# Patient Record
Sex: Male | Born: 1940 | Race: White | Hispanic: No | Marital: Single | Smoking: Former smoker
Health system: Southern US, Community
[De-identification: ages and names within clinical notes are randomized; demographics above are authoritative.]

## PROBLEM LIST (undated history)

## (undated) DIAGNOSIS — M3119 Other thrombotic microangiopathy: Secondary | ICD-10-CM

## (undated) DIAGNOSIS — I10 Essential (primary) hypertension: Secondary | ICD-10-CM

## (undated) DIAGNOSIS — I2699 Other pulmonary embolism without acute cor pulmonale: Secondary | ICD-10-CM

## (undated) DIAGNOSIS — I639 Cerebral infarction, unspecified: Secondary | ICD-10-CM

## (undated) DIAGNOSIS — M311 Thrombotic microangiopathy: Secondary | ICD-10-CM

## (undated) DIAGNOSIS — F419 Anxiety disorder, unspecified: Secondary | ICD-10-CM

## (undated) HISTORY — DX: Other pulmonary embolism without acute cor pulmonale: I26.99

## (undated) HISTORY — DX: Other thrombotic microangiopathy: M31.19

## (undated) HISTORY — DX: Cerebral infarction, unspecified: I63.9

## (undated) HISTORY — DX: Thrombotic microangiopathy: M31.1

## (undated) HISTORY — PX: SPLENECTOMY, TOTAL: SHX788

---

## 2004-11-10 ENCOUNTER — Ambulatory Visit: Payer: Self-pay | Admitting: Internal Medicine

## 2004-11-25 ENCOUNTER — Ambulatory Visit: Payer: Self-pay | Admitting: Internal Medicine

## 2005-11-15 ENCOUNTER — Ambulatory Visit: Payer: Self-pay | Admitting: Unknown Physician Specialty

## 2011-01-06 ENCOUNTER — Ambulatory Visit: Payer: Self-pay | Admitting: Unknown Physician Specialty

## 2011-01-10 LAB — PATHOLOGY REPORT

## 2011-06-02 ENCOUNTER — Emergency Department: Payer: Self-pay | Admitting: *Deleted

## 2014-07-25 ENCOUNTER — Encounter (HOSPITAL_COMMUNITY): Payer: Self-pay | Admitting: *Deleted

## 2014-07-25 ENCOUNTER — Emergency Department: Payer: Self-pay | Admitting: Emergency Medicine

## 2014-07-25 ENCOUNTER — Inpatient Hospital Stay (HOSPITAL_COMMUNITY)
Admission: AD | Admit: 2014-07-25 | Discharge: 2014-07-30 | DRG: 280 | Disposition: A | Discharge status: Home / Self Care | Payer: Medicare PPO | Source: Other Acute Inpatient Hospital | Attending: Cardiology | Admitting: Cardiology

## 2014-07-25 DIAGNOSIS — Z8673 Personal history of transient ischemic attack (TIA), and cerebral infarction without residual deficits: Secondary | ICD-10-CM

## 2014-07-25 DIAGNOSIS — I25119 Atherosclerotic heart disease of native coronary artery with unspecified angina pectoris: Secondary | ICD-10-CM | POA: Diagnosis present

## 2014-07-25 DIAGNOSIS — R0600 Dyspnea, unspecified: Secondary | ICD-10-CM

## 2014-07-25 DIAGNOSIS — I82442 Acute embolism and thrombosis of left tibial vein: Secondary | ICD-10-CM | POA: Diagnosis present

## 2014-07-25 DIAGNOSIS — I82403 Acute embolism and thrombosis of unspecified deep veins of lower extremity, bilateral: Secondary | ICD-10-CM | POA: Diagnosis not present

## 2014-07-25 DIAGNOSIS — I10 Essential (primary) hypertension: Secondary | ICD-10-CM | POA: Diagnosis present

## 2014-07-25 DIAGNOSIS — R778 Other specified abnormalities of plasma proteins: Secondary | ICD-10-CM | POA: Diagnosis present

## 2014-07-25 DIAGNOSIS — F17210 Nicotine dependence, cigarettes, uncomplicated: Secondary | ICD-10-CM | POA: Diagnosis present

## 2014-07-25 DIAGNOSIS — I2699 Other pulmonary embolism without acute cor pulmonale: Secondary | ICD-10-CM | POA: Diagnosis not present

## 2014-07-25 DIAGNOSIS — E785 Hyperlipidemia, unspecified: Secondary | ICD-10-CM | POA: Diagnosis not present

## 2014-07-25 DIAGNOSIS — R7989 Other specified abnormal findings of blood chemistry: Secondary | ICD-10-CM

## 2014-07-25 DIAGNOSIS — Z86718 Personal history of other venous thrombosis and embolism: Secondary | ICD-10-CM | POA: Clinically undetermined

## 2014-07-25 DIAGNOSIS — Z7901 Long term (current) use of anticoagulants: Secondary | ICD-10-CM | POA: Diagnosis not present

## 2014-07-25 DIAGNOSIS — I214 Non-ST elevation (NSTEMI) myocardial infarction: Principal | ICD-10-CM

## 2014-07-25 DIAGNOSIS — R0609 Other forms of dyspnea: Secondary | ICD-10-CM

## 2014-07-25 DIAGNOSIS — F419 Anxiety disorder, unspecified: Secondary | ICD-10-CM | POA: Diagnosis present

## 2014-07-25 HISTORY — DX: Anxiety disorder, unspecified: F41.9

## 2014-07-25 HISTORY — DX: Essential (primary) hypertension: I10

## 2014-07-25 LAB — COMPREHENSIVE METABOLIC PANEL
AST: 29 U/L (ref 15–37)
Albumin: 3.3 g/dL — ABNORMAL LOW (ref 3.4–5.0)
Alkaline Phosphatase: 89 U/L
Anion Gap: 8 (ref 7–16)
BILIRUBIN TOTAL: 0.8 mg/dL (ref 0.2–1.0)
BUN: 16 mg/dL (ref 7–18)
CO2: 24 mmol/L (ref 21–32)
CREATININE: 1.2 mg/dL (ref 0.60–1.30)
Calcium, Total: 9.4 mg/dL (ref 8.5–10.1)
Chloride: 109 mmol/L — ABNORMAL HIGH (ref 98–107)
EGFR (Non-African Amer.): >60
Glucose: 102 mg/dL — ABNORMAL HIGH (ref 65–99)
Osmolality: 283 (ref 275–301)
Potassium: 3.4 mmol/L — ABNORMAL LOW (ref 3.5–5.1)
SGPT (ALT): 16 U/L
Sodium: 141 mmol/L (ref 136–145)
Total Protein: 7.4 g/dL (ref 6.4–8.2)

## 2014-07-25 LAB — CBC
HCT: 50.1 % (ref 40.0–52.0)
HGB: 16.8 g/dL (ref 13.0–18.0)
MCH: 35.9 pg — AB (ref 26.0–34.0)
MCHC: 33.5 g/dL (ref 32.0–36.0)
MCV: 107 fL — ABNORMAL HIGH (ref 80–100)
Platelet: 260 10*3/uL (ref 150–440)
RBC: 4.68 10*6/uL (ref 4.40–5.90)
RDW: 14.5 % (ref 11.5–14.5)
WBC: 8.4 10*3/uL (ref 3.8–10.6)

## 2014-07-25 LAB — PRO B NATRIURETIC PEPTIDE: B-Type Natriuretic Peptide: 3573 pg/mL — ABNORMAL HIGH (ref 0–125)

## 2014-07-25 LAB — PROTIME-INR
INR: 0.9
Prothrombin Time: 12.3 secs (ref 11.5–14.7)

## 2014-07-25 LAB — TROPONIN I
Troponin I: 0.36 ng/mL — ABNORMAL HIGH (ref <0.031)
Troponin-I: 0.47 ng/mL — ABNORMAL HIGH

## 2014-07-25 LAB — APTT: ACTIVATED PTT: 26.4 s (ref 23.6–35.9)

## 2014-07-25 LAB — CK TOTAL AND CKMB (NOT AT ARMC)
CK, Total: 42 U/L (ref 39–308)
CK-MB: 3.4 ng/mL (ref 0.5–3.6)

## 2014-07-25 LAB — TSH: TSH: 0.811 u[IU]/mL (ref 0.350–4.500)

## 2014-07-25 MED ORDER — SODIUM CHLORIDE 0.9 % IJ SOLN
3.0000 mL | Freq: Two times a day (BID) | INTRAMUSCULAR | Status: DC
  Administered 2014-07-25 – 2014-07-27 (×4): 3 mL via INTRAVENOUS

## 2014-07-25 MED ORDER — SODIUM CHLORIDE 0.9 % IV SOLN: 250.0000 mL | INTRAVENOUS | Status: DC | PRN

## 2014-07-25 MED ORDER — NITROGLYCERIN 0.4 MG SL SUBL: 0.4000 mg | SUBLINGUAL_TABLET | SUBLINGUAL | Status: DC | PRN

## 2014-07-25 MED ORDER — HEPARIN (PORCINE) IN NACL 100-0.45 UNIT/ML-% IJ SOLN
1400.0000 [IU]/h | INTRAMUSCULAR | Status: DC
  Administered 2014-07-25 – 2014-07-27 (×4): 1400 [IU]/h via INTRAVENOUS
  Filled 2014-07-25 (×5): qty 250

## 2014-07-25 MED ORDER — LOSARTAN POTASSIUM 50 MG PO TABS
50.0000 mg | ORAL_TABLET | Freq: Every day | ORAL | Status: DC
  Administered 2014-07-25 – 2014-07-30 (×6): 50 mg via ORAL
  Filled 2014-07-25 (×6): qty 1

## 2014-07-25 MED ORDER — ASPIRIN 81 MG PO CHEW: 324.0000 mg | CHEWABLE_TABLET | ORAL | Status: AC

## 2014-07-25 MED ORDER — PANTOPRAZOLE SODIUM 40 MG PO TBEC
40.0000 mg | DELAYED_RELEASE_TABLET | Freq: Every day | ORAL | Status: DC
  Administered 2014-07-25 – 2014-07-30 (×6): 40 mg via ORAL
  Filled 2014-07-25 (×6): qty 1

## 2014-07-25 MED ORDER — ASPIRIN EC 81 MG PO TBEC
81.0000 mg | DELAYED_RELEASE_TABLET | Freq: Every day | ORAL | Status: DC
  Administered 2014-07-26 – 2014-07-29 (×3): 81 mg via ORAL
  Filled 2014-07-25 (×5): qty 1

## 2014-07-25 MED ORDER — ATORVASTATIN CALCIUM 40 MG PO TABS
40.0000 mg | ORAL_TABLET | Freq: Every day | ORAL | Status: DC
  Administered 2014-07-25 – 2014-07-29 (×5): 40 mg via ORAL
  Filled 2014-07-25 (×6): qty 1

## 2014-07-25 MED ORDER — ONDANSETRON HCL 4 MG/2ML IJ SOLN: 4.0000 mg | Freq: Four times a day (QID) | INTRAMUSCULAR | Status: DC | PRN

## 2014-07-25 MED ORDER — LORAZEPAM 0.5 MG PO TABS
0.5000 mg | ORAL_TABLET | Freq: Three times a day (TID) | ORAL | Status: DC | PRN
  Administered 2014-07-25 – 2014-07-27 (×5): 0.5 mg via ORAL
  Filled 2014-07-25 (×5): qty 1

## 2014-07-25 MED ORDER — METOPROLOL TARTRATE 12.5 MG HALF TABLET
12.5000 mg | ORAL_TABLET | Freq: Two times a day (BID) | ORAL | Status: DC
  Administered 2014-07-25 – 2014-07-30 (×10): 12.5 mg via ORAL
  Filled 2014-07-25 (×12): qty 1

## 2014-07-25 MED ORDER — ASPIRIN 300 MG RE SUPP
300.0000 mg | RECTAL | Status: AC
  Filled 2014-07-25: qty 1

## 2014-07-25 MED ORDER — ACETAMINOPHEN 325 MG PO TABS
650.0000 mg | ORAL_TABLET | ORAL | Status: DC | PRN
  Administered 2014-07-27 – 2014-07-28 (×2): 650 mg via ORAL
  Filled 2014-07-25 (×2): qty 2

## 2014-07-25 MED ORDER — SODIUM CHLORIDE 0.9 % IJ SOLN: 3.0000 mL | INTRAMUSCULAR | Status: DC | PRN

## 2014-07-25 NOTE — Progress Notes (Addendum)
ANTICOAGULATION CONSULT NOTE - Initial Consult  Pharmacy Consult for heparin Indication: NSTEMI  Not on File  Patient Measurements: Height: 6' (182.9 cm) Weight: 245 lb 9.5 oz (111.4 kg) IBW/kg (Calculated) : 77.6 Heparin Dosing Weight: 101 kg  Vital Signs: Temp: 98 F (36.7 C) (01/02 1637) Temp Source: Oral (01/02 1637) BP: 143/87 mmHg (01/02 1745) Pulse Rate: 86 (01/02 1745)  Labs [facility]): WBC 8.4 H/H 16.8/50.1 Platelet 260 SCr 1.2 No results for input(s): HGB, HCT, PLT, APTT, LABPROT, INR, HEPARINUNFRC, CREATININE, CKTOTAL, CKMB, TROPONINI in the last 72 hours.  CrCl cannot be calculated (Patient has no serum creatinine result on file.).   Medical History: Past Medical History  Diagnosis Date  . Hypertension   . Anxiety     Medications:  See electronic PTA med list  Assessment: 74 y/o male with no prior cardiac history who presented to [facility] ED with 10 days of dyspnea. He was started on a heparin drip for NSTEMI and positive troponin (0.47). Per [facility], he received a 4000 units IV bolus and infusion at 1400 units/hr which was started at 14:48. Cardiology note mentions a "TIA" the other day but unsure if this is just patient reported - will try to keep heparin level closer to 0.3-0.5. No bleeding noted, CBC was normal at [facility].  Goal of Therapy:  Heparin level 0.3-0.7 units/ml Monitor platelets by anticoagulation protocol: Yes   Plan:  - Continue heparin drip at 1400 units/hr - Heparin level at 23:00 - Daily heparin level and CBC - Monitor for s/sx of bleeding  [facility], [place].D., BCPS Clinical Pharmacist Pager: [phone] 07/25/2014 7:00 PM   Addendum: F/u HL is therapeutic at 0.4. RN reports no s/s of bleeding. Continue heparin infusion at 1400 units/hr. F/u 8 hour HL.   Vinnie Level, PharmD., BCPS Clinical Pharmacist Pager [phone]

## 2014-07-25 NOTE — H&P (Signed)
CARDIOLOGY ADMISSION NOTE  Patient ID: TEVYN CODD MRN: [id number] DOB/AGE: 73-Jun-1942 73 y.o.  Admit date: 07/25/2014 Primary Physician   Dr. Larwance Sachs Primary Cardiologist   None  Chief Complaint    Dyspnea  HPI:   The patient has no prior cardiac history.  He was presented to [facility] ED with about 10 days of dyspnea.  He has had no chest, neck or arm pain.  He has had no PND or orthopnea.  He saw his primary MD and was treated with a Zpac.  He did have a POET (Plain Old Exercise Treadmill).  However, he does not have these results and he was only able to stay on the treadmill for about 2 minutes stopping with elevated BP and dyspnea.  He presented to the ED today because the dyspnea was very severe walking up a flight of stairs.  He has not had any resting dyspnea, PND or orthopnea.  He did have some nausea this AM but no other associated symptoms.  He reports a "TIA" the other day.  He reached into his pocket to touch his cell phone and received a shock and could not move his left arm and did not recognize his own arm for about 5 minutes.  He did not seek medical attention.  He has been under a great deal of stress.  His mother died 5 days ago.  In the [facility] ED Troponin was elevated at 0.47.  EKG demonstrated diffuse T wave inversion.   He was treated with NTG and heparin.  He reports that prior to this he had no cardiac symptoms.    Past Medical History  Diagnosis Date  . Coronary artery disease   . Anginal pain   . Hypertension   . Anxiety     No past surgical history on file.  Not on File  Prior to Admission medications   ASA 81 mg daily  Omeprazole 20 mg po daily  Lovastatin 20 mg po daily  Losartan/HCT  50 - 12.5 po daily   History   Social History  . Marital Status: Single    Spouse Name: N/A    Number of Children: N/A  . Years of Education: N/A   Occupational History  . Not on file.   Social History Main Topics  . Smoking status: Current Every Day Smoker   Types: Cigarettes  . Smokeless tobacco: Not on file  . Alcohol Use: No     Comment: quit 30 years ago  . Drug Use: Yes  . Sexual Activity: Yes    Birth Control/ Protection: None   Other Topics Concern  . Not on file   Social History Narrative    No family history on file.   ROS:  Foot cramping.  Otherwise as stated in the HPI and negative for all other systems.  Physical Exam: Blood pressure 143/87, pulse 86, temperature 98 F (36.7 C), temperature source Oral, resp. rate 27, height 6' (1.829 m), weight 245 lb 9.5 oz (111.4 kg), SpO2 90 %.  GENERAL:  Well appearing HEENT:  Pupils equal round and reactive, fundi not visualized, oral mucosa unremarkable NECK:  No jugular venous distention, waveform within normal limits, carotid upstroke brisk and symmetric, no bruits, no thyromegaly LYMPHATICS:  No cervical, inguinal adenopathy LUNGS:  Clear to auscultation bilaterally BACK:  No CVA tenderness CHEST:  Unremarkable HEART:  PMI not displaced or sustained,S1 and S2 within normal limits, no S3, no S4, no clicks, no rubs, no murmurs ABD:  Flat, positive bowel sounds normal in frequency in pitch, no bruits, no rebound, no guarding, no midline pulsatile mass, no hepatomegaly, no splenomegaly EXT:  2 plus pulses throughout, no edema, no cyanosis no clubbing SKIN:  No rashes no nodules NEURO:  Cranial nerves II through XII grossly intact, motor grossly intact throughout PSYCH:  Cognitively intact, oriented to person place and time  Labs: Troponin 0.47, H/H  16.8/50.1, Plt 260, BUN 16, creat 1.2, Na 141, potassium 3.4, BNP 3573   Radiology:  CXR:  No acute disease (per [facility] report)  EKG:  ST, rate 104, QTc prolonged, T wave inversion in V3 - V6 and the inferior leads.  No ST changes.  07/25/2014  ASSESSMENT AND PLAN:    NSTEMI:  The patient will be kept on ASA and heparin.  He will have a cardiac cath electively.  The patient understands that risks included but are not limited to  stroke (1 in 1000), death (1 in 1000), kidney failure [usually temporary] (1 in 500), bleeding (1 in 200), allergic reaction [possibly serious] (1 in 200).  The patient understands and agrees to proceed.     RISK REDUCTION:  He has had an excellent lipid profile.  I will start moderate dose Lipitor.  HTN:  He will continue his previous dose Losartan.  I will hold HCTZ.    SignedMansoor Hillyard 07/25/2014, 6:12 PM

## 2014-07-26 LAB — BASIC METABOLIC PANEL
Anion gap: 9 (ref 5–15)
BUN: 12 mg/dL (ref 6–23)
CALCIUM: 9.3 mg/dL (ref 8.4–10.5)
CHLORIDE: 106 meq/L (ref 96–112)
CO2: 24 mmol/L (ref 19–32)
Creatinine, Ser: 1.07 mg/dL (ref 0.50–1.35)
GFR calc non Af Amer: 67 mL/min — ABNORMAL LOW (ref >90)
GFR, EST AFRICAN AMERICAN: 78 mL/min — AB (ref >90)
Glucose, Bld: 133 mg/dL — ABNORMAL HIGH (ref 70–99)
POTASSIUM: 3.7 mmol/L (ref 3.5–5.1)
Sodium: 139 mmol/L (ref 135–145)

## 2014-07-26 LAB — CBC
HCT: 45.1 % (ref 39.0–52.0)
Hemoglobin: 15.7 g/dL (ref 13.0–17.0)
MCH: 35.1 pg — AB (ref 26.0–34.0)
MCHC: 34.8 g/dL (ref 30.0–36.0)
MCV: 100.9 fL — ABNORMAL HIGH (ref 78.0–100.0)
PLATELETS: 260 10*3/uL (ref 150–400)
RBC: 4.47 MIL/uL (ref 4.22–5.81)
RDW: 15.2 % (ref 11.5–15.5)
WBC: 8.4 10*3/uL (ref 4.0–10.5)

## 2014-07-26 LAB — HEPARIN LEVEL (UNFRACTIONATED)
HEPARIN UNFRACTIONATED: 0.4 [IU]/mL (ref 0.30–0.70)
Heparin Unfractionated: 0.41 IU/mL (ref 0.30–0.70)

## 2014-07-26 LAB — TROPONIN I
TROPONIN I: 0.19 ng/mL — AB (ref <0.031)
Troponin I: 0.28 ng/mL — ABNORMAL HIGH (ref <0.031)

## 2014-07-26 LAB — MRSA PCR SCREENING: MRSA BY PCR: NEGATIVE

## 2014-07-26 MED ORDER — TICAGRELOR 90 MG PO TABS
180.0000 mg | ORAL_TABLET | Freq: Once | ORAL | Status: AC
  Administered 2014-07-26: 180 mg via ORAL
  Filled 2014-07-26: qty 2

## 2014-07-26 MED ORDER — ALPRAZOLAM 0.25 MG PO TABS
0.2500 mg | ORAL_TABLET | Freq: Every evening | ORAL | Status: DC | PRN
  Administered 2014-07-26 – 2014-07-27 (×2): 0.25 mg via ORAL
  Filled 2014-07-26 (×2): qty 1

## 2014-07-26 MED ORDER — SODIUM CHLORIDE 0.9 % IV SOLN
1.0000 mL/kg/h | INTRAVENOUS | Status: DC
  Administered 2014-07-27: 1 mL/kg/h via INTRAVENOUS

## 2014-07-26 MED ORDER — ASPIRIN 81 MG PO CHEW
81.0000 mg | CHEWABLE_TABLET | ORAL | Status: AC
  Administered 2014-07-27: 81 mg via ORAL
  Filled 2014-07-26: qty 1

## 2014-07-26 MED ORDER — SODIUM CHLORIDE 0.9 % IV SOLN: 250.0000 mL | INTRAVENOUS | Status: DC | PRN

## 2014-07-26 MED ORDER — SODIUM CHLORIDE 0.9 % IJ SOLN: 3.0000 mL | INTRAMUSCULAR | Status: DC | PRN

## 2014-07-26 MED ORDER — SODIUM CHLORIDE 0.9 % IJ SOLN: 3.0000 mL | Freq: Two times a day (BID) | INTRAMUSCULAR | Status: DC

## 2014-07-26 MED ORDER — CETYLPYRIDINIUM CHLORIDE 0.05 % MT LIQD
7.0000 mL | Freq: Two times a day (BID) | OROMUCOSAL | Status: DC
Start: 2014-07-26 — End: 2014-07-27
  Administered 2014-07-26 – 2014-07-27 (×3): 7 mL via OROMUCOSAL

## 2014-07-26 MED ORDER — DOCUSATE SODIUM 100 MG PO CAPS
100.0000 mg | ORAL_CAPSULE | Freq: Every day | ORAL | Status: DC
  Administered 2014-07-26: 100 mg via ORAL
  Filled 2014-07-26 (×2): qty 1

## 2014-07-26 MED ORDER — TICAGRELOR 90 MG PO TABS
90.0000 mg | ORAL_TABLET | Freq: Two times a day (BID) | ORAL | Status: DC
  Administered 2014-07-26 – 2014-07-28 (×4): 90 mg via ORAL
  Filled 2014-07-26 (×7): qty 1

## 2014-07-26 NOTE — Progress Notes (Signed)
Pt educated on procedure for  Cardiac cath on Monday.  Pt refused video and to have right wrist IV moved at this time.  Will continue to monitor. Karena Addison T

## 2014-07-26 NOTE — Progress Notes (Signed)
       Patient Name: Craig [name]      SUBJECTIVE: 74 yo male presenting to [facility] yday with complaints of dyspnea and Tn 0.47 and ECG TW inversions diffusely  For cath in am  No further chest pain  Great deal of stress with recent death of his mother  Past Medical History  Diagnosis Date  . Hypertension   . Anxiety     Scheduled Meds:  Scheduled Meds: . aspirin  324 mg Oral NOW   Or  . aspirin  300 mg Rectal NOW  . aspirin EC  81 mg Oral Daily  . atorvastatin  40 mg Oral q1800  . losartan  50 mg Oral Daily  . metoprolol tartrate  12.5 mg Oral BID  . pantoprazole  40 mg Oral Daily  . sodium chloride  3 mL Intravenous Q12H   Continuous Infusions: . heparin 1,400 Units/hr (07/26/14 0600)   sodium chloride, acetaminophen, LORazepam, nitroGLYCERIN, ondansetron (ZOFRAN) IV, sodium chloride    PHYSICAL EXAM Filed Vitals:   07/25/14 2111 07/25/14 2345 07/26/14 0340 07/26/14 0737  BP: 133/78 113/77 118/71 128/73  Pulse: 81  60   Temp:  98.7 F (37.1 C) 98.1 F (36.7 C) 98 F (36.7 C)  TempSrc:  Oral Oral Oral  Resp:  Height:      Weight:   242 lb 15.2 oz (110.2 kg)   SpO2:  95% 93% 97%    Well developed and nourished in no acute distress HENT normal Neck supple with JVP-flat Clear Regular rate and rhythm, no murmurs or gallops Abd-soft with active BS No Clubbing cyanosis edema Skin-warm and dry A & Oriented  Grossly normal sensory and motor function  TELEMETRY: Reviewed telemetry pt in nsr    Intake/Output Summary (Last 24 hours) at 07/26/14 1019 Last data filed at 07/26/14 0900  Gross per 24 hour  Intake  556.5 ml  Output   1000 ml  Net -443.5 ml    LABS: Basic Metabolic Panel: No results for input(s): NA, K, CL, CO2, GLUCOSE, BUN, CREATININE, CALCIUM, MG, PHOS in the last 168 hours. Cardiac Enzymes:  Recent Labs  07/25/14 2010 07/26/14 0100  TROPONINI 0.36* 0.28*   CBC:  Recent Labs Lab 07/26/14 0935  WBC 8.4  HGB 15.7   HCT 45.1  MCV 100.9*  PLT 260   PROTIME:  Thyroid Function Tests:  Recent Labs  07/25/14 2010  TSH 0.811   Anemia Panel:   ASSESSMENT AND PLAN:  Principal Problem:   NSTEMI (non-ST elevated myocardial infarction) Active Problems:   HTN (hypertension)  Continue Hep Begin Ticagrelor CAth in am  Orders written Echo pending   Signed, Sherryl Manges MD  07/26/2014

## 2014-07-26 NOTE — Progress Notes (Signed)
ANTICOAGULATION CONSULT NOTE - Follow Up Consult  Pharmacy Consult for heparin Indication: NSTEMI  Not on File  Patient Measurements: Height: 6' (182.9 cm) Weight: 242 lb 15.2 oz (110.2 kg) IBW/kg (Calculated) : 77.6 Heparin Dosing Weight: 101 kg  Vital Signs: Temp: 98 F (36.7 C) (01/03 0737) Temp Source: Oral (01/03 0737) BP: 128/73 mmHg (01/03 0737) Pulse Rate: 60 (01/03 0340)  Labs [facility]): WBC 8.4 H/H 16.8/50.1 Platelet 260 SCr 1.2  Recent Labs  07/25/14 2010 07/25/14 2341 07/26/14 0100 07/26/14 0935  HGB  --   --   --  15.7  HCT  --   --   --  45.1  PLT  --   --   --  260  HEPARINUNFRC  --  0.40  --  0.41  CREATININE  --   --   --  1.07  TROPONINI 0.36*  --  0.28* 0.19*    Estimated Creatinine Clearance: 80 mL/min (by C-G formula based on Cr of 1.07).   Medical History: Past Medical History  Diagnosis Date  . Hypertension   . Anxiety    Medications: . heparin 1,400 Units/hr (07/26/14 1041)    Assessment: 74 y/o male with no prior cardiac history who presented to [facility] ED with 10 days of dyspnea. He was started on a heparin drip for NSTEMI and positive troponin (0.47). Per [facility], he received a 4000 units IV bolus and infusion at 1400 units/hr which was started at 14:48. Cardiology note mentions a "TIA" the other day but unsure if this is just patient reported - will try to keep heparin level closer to 0.3-0.5. No bleeding noted, CBC was normal at [facility].  Heparin level this morning remains therapeutic on heparin 1400 units/hr.  No bleeding or complications noted.  Planning to go to cath lab tomorrow.  Goal of Therapy:  Heparin level 0.3-0.7 units/ml Monitor platelets by anticoagulation protocol: Yes   Plan:  - Continue heparin drip at 1400 units/hr - Daily heparin level and CBC - Monitor for s/sx of bleeding - F/u plans for anticoagulation if needed after cath tomorrow.  Tad Moore, BCPS  Clinical Pharmacist Pager [phone]  07/26/2014 11:13 AM

## 2014-07-27 ENCOUNTER — Encounter (HOSPITAL_COMMUNITY): Payer: Self-pay | Admitting: Cardiology

## 2014-07-27 ENCOUNTER — Encounter (HOSPITAL_COMMUNITY)
Admission: AD | Disposition: A | Discharge status: Home / Self Care | Payer: Self-pay | Source: Other Acute Inpatient Hospital | Attending: Cardiology

## 2014-07-27 DIAGNOSIS — I517 Cardiomegaly: Secondary | ICD-10-CM

## 2014-07-27 DIAGNOSIS — I2510 Atherosclerotic heart disease of native coronary artery without angina pectoris: Secondary | ICD-10-CM

## 2014-07-27 DIAGNOSIS — E785 Hyperlipidemia, unspecified: Secondary | ICD-10-CM

## 2014-07-27 HISTORY — PX: LEFT HEART CATHETERIZATION WITH CORONARY ANGIOGRAM: SHX5451

## 2014-07-27 LAB — HEPARIN LEVEL (UNFRACTIONATED): HEPARIN UNFRACTIONATED: 0.32 [IU]/mL (ref 0.30–0.70)

## 2014-07-27 LAB — PROTIME-INR
INR: 1.05 (ref 0.00–1.49)
Prothrombin Time: 13.8 seconds (ref 11.6–15.2)

## 2014-07-27 LAB — CBC
HEMATOCRIT: 43.3 % (ref 39.0–52.0)
HEMOGLOBIN: 15.2 g/dL (ref 13.0–17.0)
MCH: 35.5 pg — AB (ref 26.0–34.0)
MCHC: 35.1 g/dL (ref 30.0–36.0)
MCV: 101.2 fL — AB (ref 78.0–100.0)
Platelets: 240 10*3/uL (ref 150–400)
RBC: 4.28 MIL/uL (ref 4.22–5.81)
RDW: 15.4 % (ref 11.5–15.5)
WBC: 7.2 10*3/uL (ref 4.0–10.5)

## 2014-07-27 SURGERY — LEFT HEART CATHETERIZATION WITH CORONARY ANGIOGRAM
Anesthesia: LOCAL | Laterality: Bilateral

## 2014-07-27 MED ORDER — HEPARIN (PORCINE) IN NACL 2-0.9 UNIT/ML-% IJ SOLN
INTRAMUSCULAR | Status: AC
  Filled 2014-07-27: qty 1000

## 2014-07-27 MED ORDER — MORPHINE SULFATE 2 MG/ML IJ SOLN: 2.0000 mg | INTRAMUSCULAR | Status: DC | PRN

## 2014-07-27 MED ORDER — TICAGRELOR 90 MG PO TABS
90.0000 mg | ORAL_TABLET | Freq: Two times a day (BID) | ORAL | Status: DC
  Filled 2014-07-27: qty 1

## 2014-07-27 MED ORDER — NITROGLYCERIN 1 MG/10 ML FOR IR/CATH LAB
INTRA_ARTERIAL | Status: AC
  Filled 2014-07-27: qty 10

## 2014-07-27 MED ORDER — MIDAZOLAM HCL 2 MG/2ML IJ SOLN
INTRAMUSCULAR | Status: AC
  Filled 2014-07-27: qty 2

## 2014-07-27 MED ORDER — ONDANSETRON HCL 4 MG/2ML IJ SOLN
4.0000 mg | Freq: Four times a day (QID) | INTRAMUSCULAR | Status: DC | PRN
Start: 2014-07-27 — End: 2014-07-27

## 2014-07-27 MED ORDER — FENTANYL CITRATE 0.05 MG/ML IJ SOLN
INTRAMUSCULAR | Status: AC
  Filled 2014-07-27: qty 2

## 2014-07-27 MED ORDER — SODIUM CHLORIDE 0.9 % IV SOLN
INTRAVENOUS | Status: AC
Start: 2014-07-27 — End: 2014-07-28
  Administered 2014-07-27: 17:00:00 via INTRAVENOUS

## 2014-07-27 MED ORDER — VERAPAMIL HCL 2.5 MG/ML IV SOLN
INTRAVENOUS | Status: AC
  Filled 2014-07-27: qty 2

## 2014-07-27 MED ORDER — ASPIRIN 81 MG PO CHEW: 81.0000 mg | CHEWABLE_TABLET | Freq: Every day | ORAL | Status: DC

## 2014-07-27 MED ORDER — ATORVASTATIN CALCIUM 80 MG PO TABS: 80.0000 mg | ORAL_TABLET | Freq: Every day | ORAL | Status: DC

## 2014-07-27 MED ORDER — HEPARIN SODIUM (PORCINE) 1000 UNIT/ML IJ SOLN
INTRAMUSCULAR | Status: AC
  Filled 2014-07-27: qty 1

## 2014-07-27 MED ORDER — ACETAMINOPHEN 325 MG PO TABS: 650.0000 mg | ORAL_TABLET | ORAL | Status: DC | PRN

## 2014-07-27 MED ORDER — LIDOCAINE HCL (PF) 1 % IJ SOLN
INTRAMUSCULAR | Status: AC
  Filled 2014-07-27: qty 30

## 2014-07-27 NOTE — Progress Notes (Signed)
ANTICOAGULATION CONSULT NOTE - Follow Up Consult  Pharmacy Consult for Heparin Indication: NSTEMI  Not on File  Patient Measurements: Height: 6' (182.9 cm) Weight: 247 lb 5.7 oz (112.2 kg) IBW/kg (Calculated) : 77.6 Heparin Dosing Weight: 101kg  Vital Signs: Temp: 98.4 F (36.9 C) (01/04 1203) Temp Source: Oral (01/04 1203) BP: 128/66 mmHg (01/04 1203)  Labs:  Recent Labs  07/25/14 2010 07/25/14 2341 07/26/14 0100 07/26/14 0935 07/27/14 0302 07/27/14 1056  HGB  --   --   --  15.7 15.2  --   HCT  --   --   --  45.1 43.3  --   PLT  --   --   --  260 240  --   LABPROT  --   --   --   --   --  13.8  INR  --   --   --   --   --  1.05  HEPARINUNFRC  --  0.40  --  0.41 0.32  --   CREATININE  --   --   --  1.07  --   --   TROPONINI 0.36*  --  0.28* 0.19*  --   --     Estimated Creatinine Clearance: 80.7 mL/min (by C-G formula based on Cr of 1.07).   Medications:  Heparin @ 1400 units/hr  Assessment: 73yom continues on heparin for NSTEMI with plans for cath today. Heparin level is at goal. Aiming for a lower heparin goal with question of TIA. CBC is stable. No bleeding reported.  Goal of Therapy:  Heparin level 0.3-0.5 Monitor platelets by anticoagulation protocol: Yes   Plan:  1) Continue heparin at 1400 units/hr 2) Follow up after cath  Fredrik Rigger 07/27/2014,1:59 PM

## 2014-07-27 NOTE — Progress Notes (Signed)
TR band removed, no bleeding, transparent dressing applied, pt educated on activity restrictions.

## 2014-07-27 NOTE — Care Management Note (Addendum)
    Page 1 of 1   07/29/2014     2:07:06 PM CARE MANAGEMENT NOTE 07/29/2014  Patient:  Craig [name], Craig [name]   Account Number:  [id number]  Date Initiated:  07/27/2014  Documentation initiated by:  Junius Creamer  Subjective/Objective Assessment:   adm w mi     Action/Plan:   lives w fam, pcp dr Greig Castilla lamb   Anticipated DC Date:     Anticipated DC Plan:  HOME/SELF CARE      DC Planning Services  CM consult  Medication Assistance      Choice offered to / List presented to:             Status of service:   Medicare Important Message given?  YES (If response is "NO", the following Medicare IM given date fields will be blank) Date Medicare IM given:  07/28/2014 Medicare IM given by:  Junius Creamer Date Additional Medicare IM given:   Additional Medicare IM given by:    Discharge Disposition:    Per UR Regulation:  Reviewed for med. necessity/level of care/duration of stay  If discussed at Long Length of Stay Meetings, dates discussed:   07/30/2014    Comments:  1/6  0927 debbie Hence Derrick rn,bsn will have 40.00 per month copay w no prior auth req for brilinta. gave pt 30day free card for xarelto.  1/4 1040 debbie Donovan Gatchel rn,bsn gave pt 30day free brilinta card.

## 2014-07-27 NOTE — Interval H&P Note (Signed)
Cath Lab Visit (complete for each Cath Lab visit)  Clinical Evaluation Leading to the Procedure:   ACS: Yes.    Non-ACS:    Anginal Classification: CCS IV  Anti-ischemic medical therapy: No Therapy  Non-Invasive Test Results: No non-invasive testing performed  Prior CABG: No previous CABG      History and Physical Interval Note:  07/27/2014 3:03 PM  [name]  has presented today for surgery, with the diagnosis of NSTEMI  The various methods of treatment have been discussed with the patient and family. After consideration of risks, benefits and other options for treatment, the patient has consented to  Procedure(s): LEFT HEART CATHETERIZATION WITH CORONARY ANGIOGRAM (Bilateral) as a surgical intervention .  The patient's history has been reviewed, patient examined, no change in status, stable for surgery.  I have reviewed the patient's chart and labs.  Questions were answered to the patient's satisfaction.     Craig [name]

## 2014-07-27 NOTE — Progress Notes (Signed)
   74 yo male presenting to [facility] 07/25/14 with complaints of dyspnea and Tn 0.47 and ECG TW inversions diffusely. Plan LHC-angio +/- PCI Mon 1/4.  Subjective:  No CP.  Still SOB - a bit better lying down on side.  + DOE  Objective:  Vital Signs in the last 24 hours: Temp:  [97.9 F (36.6 C)-98.4 F (36.9 C)] 98 F (36.7 C) (01/04 0317) Pulse Rate:  [69] 69 (01/03 2117) Resp:  [16-20] 18 (01/04 0800) BP: (106-125)/(50-71) 122/68 mmHg (01/04 0800) SpO2:  [95 %-97 %] 96 % (01/04 0800) Weight:  [247 lb 5.7 oz (112.2 kg)] 247 lb 5.7 oz (112.2 kg) (01/04 0317)  Intake/Output from previous day: 01/03 0701 - 01/04 0700 In: 1411.2 [P.O.:780; I.V.:631.2] Out: 300 [Urine:300] Intake/Output from this shift:    Physical Exam: General appearance: alert, cooperative, appears stated age, no distress and moderately obese Neck: no adenopathy, no carotid bruit and no JVD Lungs: clear to auscultation bilaterally and normal percussion bilaterally Heart: regular rate and rhythm, S1, S2 normal, no murmur, click, rub or gallop and normal apical impulse Abdomen: soft, non-tender; bowel sounds normal; no masses,  no organomegaly Extremities: extremities normal, atraumatic, no cyanosis or edema Pulses: 2+ and symmetric Neurologic: Cranial nerves: normal  Lab Results:  Recent Labs  07/26/14 0935 07/27/14 0302  WBC 8.4 7.2  HGB 15.7 15.2  PLT 260 240    Recent Labs  07/26/14 0935  NA 139  K 3.7  CL 106  CO2 24  GLUCOSE 133*  BUN 12  CREATININE 1.07    Recent Labs  07/26/14 0100 07/26/14 0935  TROPONINI 0.28* 0.19*    Imaging: no new studies  Cardiac Studies:  EKG: NSR 84 - profound ST-T wave abnormlaties V1-6, subtle changes in II, III, aVF  Assessment/Plan:  Principal Problem:   NSTEMI (non-ST elevated myocardial infarction) Active Problems:   Essential hypertension   Hyperlipidemia with target LDL less than 70  + Troponin - with no active Anginal Sx, - mostly notes  dyspnea that is exacerbated by activity  Was started on Brilinta yesterday (? Recent ~TIA Sx) => this may potentially delay CABG if MV CAD noted, will hold AM dose pending angio findings  Continue with IV Heparin - PPI for GI prophylaxis.  With dyspnea - will need to be judicious with IVF - need LVEDP to determine volume status.  Is on BB, ARB with stable BP & HR  On statin    LOS: 2 days    Jovani Colquhoun W 07/27/2014, 8:14 AM

## 2014-07-27 NOTE — CV Procedure (Signed)
[name] is a 74 y.o. male    [id number] LOCATION:  FACILITY: [facility]  PHYSICIAN: Nanetta Batty, M.D. Feb 19, 1941   DATE OF PROCEDURE:  07/27/2014  DATE OF DISCHARGE:     CARDIAC CATHETERIZATION     History obtained from chart review.[name] is a 74 year old married Caucasian male father of 2 sons who live in [place] that was transferred from [facility] because of progressive dyspnea on exertion and minimally positive troponins with ST segment changes. He appears now for cardiac catheterization to define his anatomy.   PROCEDURE DESCRIPTION:   The patient was brought to the second floor Day Cardiac cath lab in the postabsorptive state. He was premedicated with Valium 5 mg by mouth, IV Versed and fentanyl. His right wristwas prepped and shaved in usual sterile fashion. Xylocaine 1% was used for local anesthesia. A 6 French sheath was inserted into the right radial artery using standard Seldinger technique. The patient received 5000 units  of heparin  intravenously.  A 5 [place] TIG catheter and pigtail catheters were used for selective coronary angiography and left ventriculography respectively. Visipaque dye was used for the entirety of the case. Retrograde aortic, left ventricular and pullback pressures were recorded. A total of 70 mL of contrast was administered to the patient.    HEMODYNAMICS:    AO SYSTOLIC/AO DIASTOLIC: 114/60   LV SYSTOLIC/LV DIASTOLIC: 117/13  ANGIOGRAPHIC RESULTS:   1. Left main; normal  2. LAD; minor irregularities 3. Left circumflex; 50-60% mid AV groove circumflex prior to takeoff of a marginal branch.  4. Right coronary artery; dominant with 20-30% stenoses in the midportion 5. Left ventriculography; RAO left ventriculogram was performed using  25 mL of Visipaque dye at 12 mL/second. The overall LVEF estimated  60 % Without wall motion abnormalities  IMPRESSION:[name] has noncritical CAD with normal LV  function and normal LVEDP. I do not think a circumflex lesion appears cut enough to be causing his symptoms. I am going to get a [employer] on him tomorrow to determine whether he has any lateral ischemia. If he does not, but medical therapy will be recommended. A 2-D echo cardiogram is pending as well. The etiology of his progressive dyspnea is still unclear as well as his ST segment changes. The sheath was removed and a TR band was placed on the right wrist to achieve patent hemostasis. The patient left the lab in stable condition.  Runell Gess MD, [facility] 07/27/2014 3:46 PM

## 2014-07-27 NOTE — Progress Notes (Signed)
  Echocardiogram 2D Echocardiogram has been performed.  Leta Jungling M 07/27/2014, 11:50 AM

## 2014-07-28 ENCOUNTER — Inpatient Hospital Stay (HOSPITAL_COMMUNITY): Payer: Medicare PPO

## 2014-07-28 ENCOUNTER — Encounter (HOSPITAL_COMMUNITY): Payer: Self-pay | Admitting: *Deleted

## 2014-07-28 DIAGNOSIS — I10 Essential (primary) hypertension: Secondary | ICD-10-CM

## 2014-07-28 DIAGNOSIS — I214 Non-ST elevation (NSTEMI) myocardial infarction: Secondary | ICD-10-CM

## 2014-07-28 DIAGNOSIS — R778 Other specified abnormalities of plasma proteins: Secondary | ICD-10-CM | POA: Diagnosis present

## 2014-07-28 DIAGNOSIS — R0609 Other forms of dyspnea: Secondary | ICD-10-CM

## 2014-07-28 DIAGNOSIS — R7989 Other specified abnormal findings of blood chemistry: Secondary | ICD-10-CM | POA: Diagnosis present

## 2014-07-28 MED ORDER — TECHNETIUM TC 99M SESTAMIBI GENERIC - CARDIOLITE
10.0000 | Freq: Once | INTRAVENOUS | Status: AC | PRN
  Administered 2014-07-28: 10 via INTRAVENOUS

## 2014-07-28 MED ORDER — HEPARIN (PORCINE) IN NACL 100-0.45 UNIT/ML-% IJ SOLN
1450.0000 [IU]/h | INTRAMUSCULAR | Status: AC
  Administered 2014-07-28: 1400 [IU]/h via INTRAVENOUS
  Administered 2014-07-29: 1450 [IU]/h via INTRAVENOUS
  Filled 2014-07-28 (×2): qty 250

## 2014-07-28 MED ORDER — REGADENOSON 0.4 MG/5ML IV SOLN
0.4000 mg | Freq: Once | INTRAVENOUS | Status: AC
  Administered 2014-07-28: 0.4 mg via INTRAVENOUS
  Filled 2014-07-28: qty 5

## 2014-07-28 MED ORDER — IOHEXOL 350 MG/ML SOLN
100.0000 mL | Freq: Once | INTRAVENOUS | Status: AC | PRN
  Administered 2014-07-28: 100 mL via INTRAVENOUS

## 2014-07-28 MED ORDER — HEPARIN BOLUS VIA INFUSION
2500.0000 [IU] | Freq: Once | INTRAVENOUS | Status: AC
  Administered 2014-07-28: 2500 [IU] via INTRAVENOUS
  Filled 2014-07-28: qty 2500

## 2014-07-28 MED ORDER — VITAMIN E 45 MG (100 UNIT) PO CAPS
1000.0000 [IU] | ORAL_CAPSULE | Freq: Every day | ORAL | Status: DC
  Administered 2014-07-28 – 2014-07-30 (×3): 1000 [IU] via ORAL
  Filled 2014-07-28 (×3): qty 2

## 2014-07-28 MED ORDER — TECHNETIUM TC 99M SESTAMIBI GENERIC - CARDIOLITE
30.0000 | Freq: Once | INTRAVENOUS | Status: AC | PRN
  Administered 2014-07-28: 30 via INTRAVENOUS

## 2014-07-28 MED ORDER — VITAMIN D3 25 MCG (1000 UNIT) PO TABS
1000.0000 [IU] | ORAL_TABLET | Freq: Every day | ORAL | Status: DC
  Administered 2014-07-28 – 2014-07-30 (×3): 1000 [IU] via ORAL
  Filled 2014-07-28 (×3): qty 1

## 2014-07-28 MED ORDER — LORAZEPAM 1 MG PO TABS
1.0000 mg | ORAL_TABLET | Freq: Three times a day (TID) | ORAL | Status: DC | PRN
  Administered 2014-07-28 – 2014-07-29 (×3): 1 mg via ORAL
  Filled 2014-07-28 (×3): qty 1

## 2014-07-28 MED ORDER — CYANOCOBALAMIN 500 MCG PO TABS
500.0000 ug | ORAL_TABLET | Freq: Every day | ORAL | Status: DC
  Administered 2014-07-28 – 2014-07-30 (×3): 500 ug via ORAL
  Filled 2014-07-28 (×3): qty 1

## 2014-07-28 MED ORDER — REGADENOSON 0.4 MG/5ML IV SOLN
INTRAVENOUS | Status: AC
  Filled 2014-07-28: qty 5

## 2014-07-28 MED ORDER — ZOLPIDEM TARTRATE 5 MG PO TABS
5.0000 mg | ORAL_TABLET | Freq: Every evening | ORAL | Status: DC | PRN
  Administered 2014-07-28 – 2014-07-29 (×2): 5 mg via ORAL
  Filled 2014-07-28 (×2): qty 1

## 2014-07-28 NOTE — Progress Notes (Signed)
    S:  Dr. Diona BrownerMcDowell received call from radiology with CT results showing large volume bilateral PE.  Pt is stable in room w/o complaints of c/p or dyspnea and normal O2 saturations on 2lpm via [place].  Multiple questions - all answered.  O:   Filed Vitals:   07/28/14 1919  BP: 143/68  Pulse:   Temp: 98.3 F (36.8 C)  Resp:    AAOX3, pleasant, NAD. Cor: RRR, Lungs: CTA. Ext with trace bilat LEE.  He says that lower ext are tender to touch bilat. No palpable cords. DP 2+ bilat.  CT Angio of the Chest with Contrast 1.6.2016  FINDINGS: Lungs/Pleura: Minimal motion degradation throughout. Mild centrilobular emphysema.  No pleural fluid.  Heart/Mediastinum: The quality of this examination for evaluation of pulmonary embolism is sufficient. Large volume bilateral pulmonary emboli, including a throughout the right-sided lobar branches and primarily left-sided lower lobe segmental and subsegmental branches. LV to RV ratio is increased at 1.18.  Normal caliber of the thoracic aorta, which is atherosclerotic. Mild cardiomegaly. Coronary artery atherosclerosis. No mediastinal or hilar adenopathy.  Upper Abdomen: Splenectomy. Normal imaged portions of the liver, stomach, pancreas. Cholelithiasis. Normal imaged portions of the adrenal glands. Bilateral nephrolithiasis. Upper pole left renal cyst with bilateral too small to characterize renal lesions.  Bones/Musculoskeletal: No acute osseous abnormality.  Review of the MIP images confirms the above findings.  IMPRESSION: 1. Large volume pulmonary embolism. Positive for acute PE with CT evidence of right heartstrain (RV/LV Ratio = 1.18) consistent with at least submassive (intermediate risk)PE. The presence of right heart strain has been associated with anincreased risk of morbidity and mortality. Consultation with Pulmonary and Critical Care Medicine is recommended. These results were called by telephone at the time of  interpretation on 07/28/2014 at 6:56 pm to Dr. Simona HuhSam McDowell, who verbally acknowledged these results. 2. Mildly motion degraded exam. 3. Atherosclerosis, including within the coronary arteries. 4. Bilateral nephrolithiasis. _____________   A/P:  1. Acute bilateral submassive Pulmonary Emboli:  Pt is hemodynamically stable with normal O2 saturations.  Case reviewed with Dr. Diona BrownerMcDowell and also Dr. Dema SeverinMungal from Pulmonary/Critical Care.  Given hemodynamic stability, no indication for acute intervention/lytics.  Echo from 1/4 reviewed - nl EF, nl RV size and fxn.  Heparin to be started tonight with eye towards [facility] next 24 hrs (pt would like to weigh benefits/risks of xarelto vs eliquis but is not interested in coumadin).  Will scan bilat LE for DVT.  Pt says he's been sitting around more with the crummy weather over Christmas but has not been bedridden or had prolonged periods of travel.  Dr. Dema SeverinMungal recs f/u with pulmonology as an outpt in 1 month.  All patient questions answered.  Nicolasa Duckinghristopher Jessiah Wojnar, NP

## 2014-07-28 NOTE — Progress Notes (Signed)
ANTICOAGULATION CONSULT NOTE - Initial Consult  Pharmacy Consult for Heparin Indication: pulmonary embolus  No Known Allergies  Patient Measurements: Height: 6' (182.9 cm) Weight: 247 lb 5.7 oz (112.2 kg) IBW/kg (Calculated) : 77.6 Heparin Dosing Weight: 102 kg  Vital Signs: Temp: 98.3 F (36.8 C) (01/05 1919) Temp Source: Oral (01/05 1919) BP: 143/68 mmHg (01/05 1919) Pulse Rate: 61 (01/05 1236)  Labs:  Recent Labs  07/25/14 2010 07/25/14 2341 07/26/14 0100 07/26/14 0935 07/27/14 0302 07/27/14 1056  HGB  --   --   --  15.7 15.2  --   HCT  --   --   --  45.1 43.3  --   PLT  --   --   --  260 240  --   LABPROT  --   --   --   --   --  13.8  INR  --   --   --   --   --  1.05  HEPARINUNFRC  --  0.40  --  0.41 0.32  --   CREATININE  --   --   --  1.07  --   --   TROPONINI 0.36*  --  0.28* 0.19*  --   --     Estimated Creatinine Clearance: 80.7 mL/min (by C-G formula based on Cr of 1.07).   Medical History: Past Medical History  Diagnosis Date  . Essential hypertension   . Anxiety   . Hyperlipidemia with target LDL less than 70     Medications:  Scheduled:  . aspirin EC  81 mg Oral Daily  . atorvastatin  40 mg Oral q1800  . cholecalciferol  1,000 Units Oral Daily  . vitamin B-12  500 mcg Oral Daily  . losartan  50 mg Oral Daily  . metoprolol tartrate  12.5 mg Oral BID  . pantoprazole  40 mg Oral Daily  . regadenoson      . ticagrelor  90 mg Oral BID  . vitamin E  1,000 Units Oral Daily   Infusions:    Assessment: 74 yo M known to pharmacy from previous heparin dosing for NSTEMI.  Heparin was discontinued post-cath ~ 07/27/13 @ 1600.  Pt still noted DOE and thus a CTA was ordered to r/o PE.  Results of CTA = large volume bilateral PE.  Previously patient was therapeutic on heparin at 1400 units/hr.  Goal of Therapy:  Heparin level 0.3-0.7 units/ml Monitor platelets by anticoagulation protocol: Yes   Plan:  Heparin 2500 unit IV bolus x 1 (reduced  bolus since pt ~24h post-cath)  Heparin infusion at 1400 units/hr Heparin level in 6 hours Heparin level and CBC daily while on heparin.  Toys 'R' UsKimberly Jaye Polidori, Pharm.D., BCPS Clinical Pharmacist Pager [phone] 07/28/2014 7:38 PM

## 2014-07-28 NOTE — Progress Notes (Signed)
eLink Physician-Brief Progress Note Patient Name: Craig BaumgartnerJames L [name] DOB: 10/10/1940 MRN: [id number]   Date of Service  07/28/2014  HPI/Events of Note  Patient admitted for nstemi, found have bilateral submassive PE  eICU Interventions  ECHO with no RV strain, patient with stable hemodynamics. Already on heparin gtt. Recs 1. Cont with anticoagulation 2. [facility] 3. If hemodynamic instability develops or respiratory distress then contact PCCM 4. F\U with [facility] one month after D\C.         Marlena Barbato 07/28/2014, 7:35 PM

## 2014-07-28 NOTE — Progress Notes (Signed)
Patient Name: Craig [name] Date of Encounter: 07/28/2014   Principal Problem:   Elevated troponin Active Problems:   Essential hypertension   Hyperlipidemia with target LDL less than 70    SUBJECTIVE  No chest pain or sob overnight.  Cath yesterday revealing moderate nonobs LCX dzs.  For MV today.  CURRENT MEDS . aspirin EC  81 mg Oral Daily  . atorvastatin  40 mg Oral q1800  . losartan  50 mg Oral Daily  . metoprolol tartrate  12.5 mg Oral BID  . pantoprazole  40 mg Oral Daily  . regadenoson      . ticagrelor  90 mg Oral BID    OBJECTIVE  Filed Vitals:   07/28/14 0729 07/28/14 1012 07/28/14 1042 07/28/14 1044  BP: 127/76 130/78 123/70 129/56  Pulse:      Temp: 97.7 F (36.5 C)     TempSrc: Oral     Resp:      Height:      Weight:      SpO2: 97%       Intake/Output Summary (Last 24 hours) at 07/28/14 1045 Last data filed at 07/27/14 2000  Gross per 24 hour  Intake 1105.89 ml  Output    225 ml  Net 880.89 ml   Filed Weights   07/25/14 1637 07/26/14 0340 07/27/14 0317  Weight: 245 lb 9.5 oz (111.4 kg) 242 lb 15.2 oz (110.2 kg) 247 lb 5.7 oz (112.2 kg)    PHYSICAL EXAM  General: Pleasant, NAD. Neuro: Alert and oriented X 3. Moves all extremities spontaneously. Psych: Normal affect. HEENT:  Normal  Neck: Supple without bruits or JVD. Lungs:  Resp regular and unlabored, CTA. Heart: RRR no s3, s4, or murmurs. Abdomen: Soft, non-tender, non-distended, BS + x 4.  Extremities: No clubbing, cyanosis or edema. DP/PT/Radials 2+ and equal bilaterally.  R wrist w/o bleeding/bruit/hematoma.  Accessory Clinical Findings  CBC  Recent Labs  07/26/14 0935 07/27/14 0302  WBC 8.4 7.2  HGB 15.7 15.2  HCT 45.1 43.3  MCV 100.9* 101.2*  PLT 260 240   Basic Metabolic Panel  Recent Labs  07/26/14 0935  NA 139  K 3.7  CL 106  CO2 24  GLUCOSE 133*  BUN 12  CREATININE 1.07  CALCIUM 9.3   Cardiac Enzymes  Recent Labs  07/25/14 2010 07/26/14 0100  07/26/14 0935  TROPONINI 0.36* 0.28* 0.19*   Thyroid Function Tests  Recent Labs  07/25/14 2010  TSH 0.811    TELE  Seen in nuc med.  Radiology/Studies  No results found.  ASSESSMENT AND PLAN  1.  Elevated troponin/CAD: s/p cath yesterday revealing mod nonobs lcx dzs. For MV today to determine ischemic significance of that lesion.  No further c/p or dyspnea.  Cont asa, statin, bb, arb.  Ticagrelor was started the other day 2/2 concern for ACS.  If nuc study nl, consider d/c or switch to plavix.  2.  HTN:  Stable on bb/arb.  3. HL:  Cont statin.  Signed, Nicolasa Ducking NP  ATTENDING NOTE:  Principal Problem:   Elevated troponin Active Problems:   Essential hypertension   Hyperlipidemia with target LDL less than 70  Pt seen & examined this PM after Nuc Med study. Cath reviewed.  & Nuc read as Normal --> this likely excludes the Cx lesion as an ischemic lesion leading to his Sx. Still notes DOE -- with + Troponin & DOE, PAP on Echo of ~40-23mmHg, will check CTA Chest to exclude  PE & get full lung eval.  Will convert to Plavix.   Has HA - OK for PRN Aleve BP stable on current meds & on statin.  C/o insomnia -- ambien; in addition to PRN Ativan for anxiety  Pending results of CTA - would anticipate probable d/c in AM (to allow time for the study to be done & med adjustments to be made).  To simplify d/c process would be easier to keep in current unit - but OK to transfer if bed needed.   Marykay LexHARDING, Scarlett Portlock W, M.D., M.S. Interventional Cardiologist   Pager # [phone]

## 2014-07-29 DIAGNOSIS — Z86718 Personal history of other venous thrombosis and embolism: Secondary | ICD-10-CM | POA: Clinically undetermined

## 2014-07-29 DIAGNOSIS — I2699 Other pulmonary embolism without acute cor pulmonale: Secondary | ICD-10-CM

## 2014-07-29 LAB — HEPARIN LEVEL (UNFRACTIONATED)
HEPARIN UNFRACTIONATED: 0.36 [IU]/mL (ref 0.30–0.70)
Heparin Unfractionated: 0.38 IU/mL (ref 0.30–0.70)

## 2014-07-29 LAB — CBC
HCT: 40.2 % (ref 39.0–52.0)
HEMOGLOBIN: 13.8 g/dL (ref 13.0–17.0)
MCH: 35.7 pg — AB (ref 26.0–34.0)
MCHC: 34.3 g/dL (ref 30.0–36.0)
MCV: 103.9 fL — AB (ref 78.0–100.0)
Platelets: 238 10*3/uL (ref 150–400)
RBC: 3.87 MIL/uL — AB (ref 4.22–5.81)
RDW: 15.7 % — AB (ref 11.5–15.5)
WBC: 5.6 10*3/uL (ref 4.0–10.5)

## 2014-07-29 LAB — ANTITHROMBIN III: AntiThromb III Func: 67 % — ABNORMAL LOW (ref 75–120)

## 2014-07-29 MED ORDER — RIVAROXABAN 15 MG PO TABS
15.0000 mg | ORAL_TABLET | Freq: Two times a day (BID) | ORAL | Status: DC
  Administered 2014-07-29 – 2014-07-30 (×2): 15 mg via ORAL
  Filled 2014-07-29 (×5): qty 1

## 2014-07-29 NOTE — Consult Note (Signed)
PULMONARY / CRITICAL CARE MEDICINE   Name: Craig BaumgartnerJames L [name] MRN: [id number] DOB: 01/26/1941    ADMISSION DATE:  07/25/2014 CONSULTATION DATE:  07/29/13  REFERRING MD :  Herbie BaltimoreHarding   CHIEF COMPLAINT:  PE   INITIAL PRESENTATION: 74 yo male with hx HTN, hyperlipidemia initially presented 1/2 to [place] with 10 day hx SOB.  Found to have mildly elevated troponin (0.47) and diffuse T wave inversion.  He was treated with NTG and heparin and [facility].  Underwent cardiac cath 1/4 which revealed only non-critical CAD.  Ultimately underwent CTA chest which revealed bilat submassive PE.   STUDIES:  Cath 1/4 >>>noncritical CAD, normal LV function, L circumflex 50-60% 2D echo >>>EF 50-55%, no RV dilation, PASP 42mmHg, grade 1 diastolic dysfunction  CTA chest 1/5 >>> Bilat submassive PE  BLE dopplers 1/6>>> POS DVT BLE, no mention of mobile clot   SIGNIFICANT EVENTS:   HISTORY OF PRESENT ILLNESS:  74 yo male former smoker with hx HTN, hyperlipidemia initially presented 1/2 to [place] with 10 day hx SOB.  Found to have mildly elevated troponin (0.47) and diffuse T wave inversion.  He was treated with NTG and heparin and [facility].  Underwent cardiac cath 1/4 which revealed only non-critical CAD.  Ultimately underwent CTA chest which revealed bilat submassive PE.   Started on heparin gtt.  PCCM consulted for assist.  Currently feeling better, comfortable.  Has been hemodynamically stable.  Denies SOB at rest but does c/o DOE.  Also c/o BLE tenderness but no swelling.  Denies chest pain, hemoptysis, fevers.  Denies recent travel, surgeries or lifestyle changes.  Says he is sedentary at times but this has not changed significantly.   PAST MEDICAL HISTORY :   has a past medical history of Essential hypertension; Anxiety; and Hyperlipidemia with target LDL less than 70.  has past surgical history that includes Splenectomy, total and left heart catheterization with coronary  angiogram (Bilateral, 07/27/2014). Prior to Admission medications   Medication Sig Start Date End Date Taking? Authorizing Provider  cholecalciferol (VITAMIN D) 1000 UNITS tablet Take 1,000 Units by mouth daily.   Yes Historical Provider, MD  losartan-hydrochlorothiazide (HYZAAR) 50-12.5 MG per tablet Take 1 tablet by mouth daily. 07/20/14  Yes Historical Provider, MD  lovastatin (MEVACOR) 20 MG tablet Take 10 mg by mouth daily. 07/20/14  Yes Historical Provider, MD  Melatonin 5 MG TABS Take 15 mg by mouth at bedtime.   Yes Historical Provider, MD  omeprazole (PRILOSEC) 40 MG capsule Take 40 mg by mouth daily. 04/26/14  Yes Historical Provider, MD  vitamin B-12 (CYANOCOBALAMIN) 500 MCG tablet Take 500 mcg by mouth daily.   Yes Historical Provider, MD  vitamin E 1000 UNIT capsule Take 1,000 Units by mouth daily.   Yes Historical Provider, MD   No Known Allergies  FAMILY HISTORY:  has no family status information on file.  SOCIAL HISTORY:  reports that he has quit smoking. His smoking use included Cigarettes. He smoked 0.00 packs per day for 20 years. He does not have any smokeless tobacco history on file. He reports that he uses illicit drugs. He reports that he does not drink alcohol.  REVIEW OF SYSTEMS:  As per HPI - All other systems reviewed and were neg.    SUBJECTIVE:   VITAL SIGNS: Temp:  [97.7 F (36.5 C)-98.4 F (36.9 C)] 97.7 F (36.5 C) (01/06 1208) Pulse Rate:  [57-62] 60 (01/06 1208) BP: (100-143)/(51-70)  122/62 mmHg (01/06 1101) SpO2:  [94 %-100 %] 98 % (01/06 1101)  INTAKE / OUTPUT:  Intake/Output Summary (Last 24 hours) at 07/29/14 1238 Last data filed at 07/29/14 1100  Gross per 24 hour  Intake  548.1 ml  Output    300 ml  Net  248.1 ml    PHYSICAL EXAMINATION: General:  Pleasant wdwn male, NAD in bed  Neuro:  Awake, alert, appropriate, MAE  HEENT:  Mm moist, no JVD  Cardiovascular:  s1s2 rrr Lungs:  resps even non labored on 2L [place], cta  Abdomen:  Round,  soft, +bs  Musculoskeletal:  Warm and dry, BLE tenderness, scant BLE edema, no warmth/redness   LABS:  CBC  Recent Labs Lab 07/26/14 0935 07/27/14 0302 07/29/14 0133  WBC 8.4 7.2 5.6  HGB 15.7 15.2 13.8  HCT 45.1 43.3 40.2  PLT 260 240 238   Coag's  Recent Labs Lab 07/27/14 1056  INR 1.05   BMET  Recent Labs Lab 07/26/14 0935  NA 139  K 3.7  CL 106  CO2 24  BUN 12  CREATININE 1.07  GLUCOSE 133*   Electrolytes  Recent Labs Lab 07/26/14 0935  CALCIUM 9.3   Cardiac Enzymes  Recent Labs Lab 07/25/14 2010 07/26/14 0100 07/26/14 0935  TROPONINI 0.36* 0.28* 0.19*     ASSESSMENT / PLAN:  Bilateral submassive PE - hemodynamically stable with minimal RV strain on echo (d/w cards).  Respiratory status stable.  Does have significant blot burden and bilateral DVT.  Relatively unprovoked although he is fairly sedentary, this has not changed.  BLE DVT   REC -  -Cont heparin gtt  -Will need coumadin v NOAC  -No indication systemic or catheter directed thrombolytics at this time  -hypercaogulable panel pending  -Doubt needs IVC filter.  He does have large clot burden and bilat DVT but non mobile, has been on anticoagulation x 4 days, has remained hemodynamically stable with minimal RV strain  -outpt pulm f/u 1 month -outpt f/u echo  -check ambulatory desat - may need to d/c on short term O2   D/w Dr. Herbie Baltimore.   Dirk Dress, NP 07/29/2014  12:38 PM Pager: [phone] or [phone]  PCCM ATTENDING: I have reviewed pt's initial presentation, consultants notes and hospital database in detail.  The above assessment and plan was formulated under my direction.  In summary: Unprovoked PE - moderate in size. Little or no evidence of RV overload but significantly abnormal EKG on admission (not repeated). He has been evaluated for ACS which was negative (noncritical CAD). He has been on heparin since admission.  REC: No indication for systemic or local  lytic therapy at this time No indication for IVC filter at this time Hypercoag w/u Should ensure that he has had all of recommended cancer screening  Check PSA  Initiate rivaroxaban per pharmacy protocol  Duration of therapy to be determined Check LE venous duplex scans  If large or mobile clot, could consider temporary IVC filter Check ambulatory SpO2 Repeat EKG to ensure resolution of prior changes Agree with transfer to tele If LE scans don't reveal anything worrisome, he can probably be discharged safely in next day or two so I have left on Cards service for now   Billy Fischer, MD;  PCCM service; Mobile [phone]  ADD: I have reviewed findings of LE duplex scans. These are relatively distal clots and should not require IVC filter placement nor should they delay his discharge  Billy Fischer, MD ;  PCCM service Mobile [phone].  After 5:30 PM or weekends, call [phone]

## 2014-07-29 NOTE — Progress Notes (Signed)
Patient Name: Craig [name] Date of Encounter: 07/29/2014  Initially admitted with exertional dyspnea and positive troponins. Thought to be secondary to non-STEMI. Was referred for cardiac catheterization which showed moderate circumflex disease. Was referred for Myoview to evaluate for ischemia in that distribution.    With negative Myoview yesterday, he was referred for PE protocol CT scan with results noted below showing bilateral pulmonary emboli. [facility]. Has been on IV heparin. Hypercoagulable panel was ordered.  SUBJECTIVE No chest pain or sob overnight.   Still dyspneic with exertion.    CURRENT MEDS . aspirin EC  81 mg Oral Daily  . atorvastatin  40 mg Oral q1800  . cholecalciferol  1,000 Units Oral Daily  . vitamin B-12  500 mcg Oral Daily  . losartan  50 mg Oral Daily  . metoprolol tartrate  12.5 mg Oral BID  . pantoprazole  40 mg Oral Daily  . Rivaroxaban  15 mg Oral BID WC  . vitamin E  1,000 Units Oral Daily    OBJECTIVE  Filed Vitals:   07/29/14 0400 07/29/14 0835 07/29/14 1101 07/29/14 1208  BP: 100/58 122/70 122/62   Pulse:  62 62 60  Temp: 97.7 F (36.5 C) 98.4 F (36.9 C)  97.7 F (36.5 C)  TempSrc: Oral Oral  Oral  Resp:      Height:      Weight:      SpO2: 94% 99% 98%     Intake/Output Summary (Last 24 hours) at 07/29/14 1325 Last data filed at 07/29/14 1100  Gross per 24 hour  Intake  548.1 ml  Output    300 ml  Net  248.1 ml   Filed Weights   07/25/14 1637 07/26/14 0340 07/27/14 0317  Weight: 245 lb 9.5 oz (111.4 kg) 242 lb 15.2 oz (110.2 kg) 247 lb 5.7 oz (112.2 kg)    PHYSICAL EXAM General: Pleasant, NAD. Neuro: Alert and oriented X 3. Moves all extremities spontaneously. Psych: Normal affect. HEENT:  Normal  Neck: Supple without bruits or JVD. Lungs:  Resp regular and unlabored, CTA. Heart: RRR no s3, s4, or murmurs. Abdomen: Soft, non-tender, non-distended, BS + x 4.  Extremities: No clubbing, cyanosis or  edema. DP/PT/Radials 2+ and equal bilaterally.  R wrist w/o bleeding/bruit/hematoma. Mildly tender to palpation bilaterally.  Accessory Clinical Findings  CBC  Recent Labs  07/27/14 0302 07/29/14 0133  WBC 7.2 5.6  HGB 15.2 13.8  HCT 43.3 40.2  MCV 101.2* 103.9*  PLT 240 238   Basic Metabolic Panel No results for input(s): NA, K, CL, CO2, GLUCOSE, BUN, CREATININE, CALCIUM, MG, PHOS in the last 72 hours. Cardiac Enzymes No results for input(s): CKTOTAL, CKMB, CKMBINDEX, TROPONINI in the last 72 hours. Thyroid Function Tests No results for input(s): TSH, T4TOTAL, T3FREE, THYROIDAB in the last 72 hours.  Invalid input(s): FREET3  TELE NSR  Radiology/Studies  Ct Angio Chest Pe W/cm &/or Wo Cm  07/28/2014   CLINICAL DATA:  Dyspnea on exertion.  Elevated troponin.  EXAM: CT ANGIOGRAPHY CHEST WITH CONTRAST  TECHNIQUE: Multidetector CT imaging of the chest was performed using the standard protocol during bolus administration of intravenous contrast. Multiplanar CT image reconstructions and MIPs were obtained to evaluate the vascular anatomy.  CONTRAST:  100mL OMNIPAQUE IOHEXOL 350 MG/ML SOLN  COMPARISON:  Chest radiograph of 07/25/2013.  FINDINGS: Lungs/Pleura: Minimal motion degradation throughout. Mild centrilobular emphysema.  No pleural fluid.  Heart/Mediastinum: The quality of this examination for evaluation of pulmonary  embolism is sufficient. Large volume bilateral pulmonary emboli, including a throughout the right-sided lobar branches and primarily left-sided lower lobe segmental and subsegmental branches. LV to RV ratio is increased at 1.18.  Normal caliber of the thoracic aorta, which is atherosclerotic. Mild cardiomegaly. Coronary artery atherosclerosis. No mediastinal or hilar adenopathy.  Upper Abdomen: Splenectomy. Normal imaged portions of the liver, stomach, pancreas. Cholelithiasis. Normal imaged portions of the adrenal glands. Bilateral nephrolithiasis. Upper pole left renal  cyst with bilateral too small to characterize renal lesions.  Bones/Musculoskeletal:  No acute osseous abnormality.  Review of the MIP images confirms the above findings.  IMPRESSION: 1. Large volume pulmonary embolism. Positive for acute PE with CT evidence of right heartstrain (RV/LV Ratio = 1.18) consistent with at least submassive (intermediate risk)PE. The presence of right heart strain has been associated with anincreased risk of morbidity and mortality. Consultation with Pulmonary and Critical Care Medicine is recommended. These results were called by telephone at the time of interpretation on 07/28/2014 at 6:56 pm to Dr. Simona Huh, who verbally acknowledged these results. 2. Mildly motion degraded exam. 3.  Atherosclerosis, including within the coronary arteries. 4. Bilateral nephrolithiasis.   Electronically Signed   By: Jeronimo Greaves M.D.   On: 07/28/2014 18:56   Nm Myocar Multi W/spect W/wall Motion / Ef  07/28/2014   CLINICAL DATA:  Craig [name] is a 74 year old gentleman who was admitted to the hospital with worsening dyspnea and minimally positive chronic enzymes. Cardiac Rosacea revealed moderate coronary artery disease. He was scheduled for a Lexiscan Myoview study for further evaluation of his moderate CAD.  EXAM: MYOCARDIAL IMAGING WITH SPECT (REST AND PHARMACOLOGIC-STRESS)  GATED LEFT VENTRICULAR WALL MOTION STUDY  LEFT VENTRICULAR EJECTION FRACTION  TECHNIQUE: Standard myocardial SPECT imaging was performed after resting intravenous injection of 10 mCi Tc-35m sestamibi. Subsequently, intravenous infusion of Lexiscan was performed under the supervision of the Cardiology staff. At peak effect of the drug, 30 mCi Tc-53m sestamibi was injected intravenously and standard myocardial SPECT imaging was performed. Quantitative gated imaging was also performed to evaluate left ventricular wall motion, and estimate left ventricular ejection fraction.  COMPARISON:  None.  FINDINGS: His resting EKG revealed  normal sinus rhythm. He has T-wave inversions in leads V3 through V5. There were no significant EKG changes with the DEXA scan injection.  The stress Myoview images reveal fairly smooth and homogeneous uptake in all areas of the myocardium  The resting Myoview studies reveal a similar pattern of smooth and homogeneous uptake in all areas of the myocardium.  There is no evidence of reversible ischemia.  The quantitative gated SPECT analysis reveals an end-diastolic volume of 113 mL. The end-systolic volumes 37 mL. The ejection fraction is 67%. The cine loop images reveal normal thickening and normal contractility in all areas of the myocardium. There are no segmental wall motion abdomen allergies.  IMPRESSION: 1. This is interpreted as a negative stress Myoview study. There is no evidence of ischemia. He has normal left ventricular systolic function. The ejection fraction is 67%.  Lucienne Minks.  Nahser, Montez Hageman MD, [facility]   Electronically Signed   By: Kristeen Miss   On: 07/28/2014 13:19   Cath 07/27/2013  revealing moderate nonobs LCX dzs. - With negative Myoview  ASSESSMENT AND PLAN Principal Problem:   Pulmonary embolus with infarction - Bilateral Active Problems:   DVT of lower extremity, bilateral   Essential hypertension   Hyperlipidemia with target LDL less than 70   Elevated troponin  1. Bilateral  pulmonary emboli, now with lower STEMI Doppler showing DVTs. This appears to be a probably a subacute condition with his symptoms be ongoing for roughly 3 weeks. I don't think that he would be a thrombolytics candidate based on the chronicity.   Awaiting the final results from the grocery Dopplers RE possible discussion for IVC filter placement. I have consult [facility] M for their advice.   For now, IV heparin - and will allow for PCI and discuss using warfarin versus NOAC.   He is still quite symptomatic with exertional dyspnea. I think he needs at least another day or so to out for anticoagulation and  improvement of symptoms. I will transfer him to the telemetry floor today. Would anticipate discharge in 1-2 days.  2. Elevated troponin/CAD: s/p cath yesterday revealing mod nonobs lcx dzs now with negative Myoview. Initial diagnosis of non-STEMI is probably not accurate as the positive troponin levels are likely related to large bilateral pulmonary emboli as the etiology for his symptoms. I therefore changed the diagnosis from non-ST elevation MI to elevated troponin.  With now a clear-cut etiology for positive troponins, will stop Plavix/Brilinta  3. HTN:  Stable on bb/arb.  4. HL:  Cont statin. 5. Insomnia and anxiety. When necessary Ambien daily at bedtime and when necessary Ativan.  Transfer to telemetry today. Anticipate discharge once 2 days.  Marykay Lex, M.D., M.S. Interventional Cardiologist   Pager # [phone]

## 2014-07-29 NOTE — Progress Notes (Signed)
Pt transferred to room 2W16 from 2heart. Pt is alert and oriented x4, able to follow commands, denies any pain at this time. VSS, no s/s of respiratory distress on 2L . Standard hospital orientation completed, bed in lowest position, call bell is within reach. Will continue to monitor pt closely. [facility].

## 2014-07-29 NOTE — Progress Notes (Signed)
*  Preliminary Results* Bilateral lower extremity venous duplex completed. Bilateral lower extremities are positive for deep vein thrombosis involving the right peroneal and left posterior tibial veins. There is no evidence of Baker's cyst bilaterally.  Preliminary results discussed with Selena BattenKim, RN.  07/29/2014  Gertie FeyMichelle Moxon Messler, RVT, RDCS, RDMS

## 2014-07-29 NOTE — Progress Notes (Signed)
Pt transferred to 2W16 from 2H19 with belongings, medications, and chart. Vital signs stable and patient has no complaints at this time. Wife accompanied transfer. Report given to receiving RN, Diona BrownerAntoinette Ford.

## 2014-07-29 NOTE — Progress Notes (Signed)
ANTICOAGULATION CONSULT NOTE - Initial Consult  Pharmacy Consult for Heparin Indication: pulmonary embolus  No Known Allergies  Patient Measurements: Height: 6' (182.9 cm) Weight: 247 lb 5.7 oz (112.2 kg) IBW/kg (Calculated) : 77.6 Heparin Dosing Weight: 102 kg  Vital Signs: Temp: 98.3 F (36.8 C) (01/05 1919) Temp Source: Oral (01/05 1919) BP: 109/51 mmHg (01/05 2215) Pulse Rate: 57 (01/05 2215)  Labs:  Recent Labs  07/26/14 0935 07/27/14 0302 07/27/14 1056 07/29/14 0133  HGB 15.7 15.2  --  13.8  HCT 45.1 43.3  --  40.2  PLT 260 240  --  238  LABPROT  --   --  13.8  --   INR  --   --  1.05  --   HEPARINUNFRC 0.41 0.32  --  0.38  CREATININE 1.07  --   --   --   TROPONINI 0.19*  --   --   --     Estimated Creatinine Clearance: 80.7 mL/min (by C-G formula based on Cr of 1.07).   Medical History: Past Medical History  Diagnosis Date  . Essential hypertension   . Anxiety   . Hyperlipidemia with target LDL less than 70     Medications:  Scheduled:  . aspirin EC  81 mg Oral Daily  . atorvastatin  40 mg Oral q1800  . cholecalciferol  1,000 Units Oral Daily  . vitamin B-12  500 mcg Oral Daily  . losartan  50 mg Oral Daily  . metoprolol tartrate  12.5 mg Oral BID  . pantoprazole  40 mg Oral Daily  . ticagrelor  90 mg Oral BID  . vitamin E  1,000 Units Oral Daily   Infusions:  . heparin 1,400 Units/hr (07/28/14 2026)    Assessment: 74 yo M known to pharmacy from previous heparin dosing for NSTEMI.  Heparin was discontinued post-cath ~ 07/27/13 @ 1600.  CT then showed a large volume PE with evidence or right heart strain. HL is therapeutic at 0.38 but on lower end of goal. RN reports no s/s of bleeding   Goal of Therapy:  Heparin level 0.3-0.7 units/ml Monitor platelets by anticoagulation protocol: Yes   Plan:  Increase heparin infusion to 1450 units/hr Heparin level in 6 hours Heparin level and CBC daily while on heparin.  Vinnie LevelBenjamin Harvey Matlack, PharmD.,  BCPS Clinical Pharmacist Pager [phone]

## 2014-07-29 NOTE — Consult Note (Signed)
PULMONARY / CRITICAL CARE MEDICINE   Name: Craig [name] MRN: [id number] DOB: 07-12-1941    ADMISSION DATE:  07/25/2014 CONSULTATION DATE:  07/29/13  REFERRING MD :  Herbie Baltimore   CHIEF COMPLAINT:  PE   INITIAL PRESENTATION: 74 yo male with hx HTN, hyperlipidemia initially presented 1/2 to [place] with 10 day hx SOB.  Found to have mildly elevated troponin (0.47) and diffuse T wave inversion.  He was treated with NTG and heparin and tx to [facility] with concern for ACS.  Underwent cardiac cath 1/4 which revealed only non-critical CAD.  Ultimately underwent CTA chest which revealed bilat submassive PE.   STUDIES:  Cath 1/4 >>>noncritical CAD, normal LV function, L circumflex 50-60% 2D echo >>>EF 50-55%, no RV dilation, PASP , grade 1 diastolic dysfunction  CTA chest 1/5 >>> Bilat submassive PE  BLE dopplers 1/6>>> POS DVT BLE, no mention of mobile clot   SIGNIFICANT EVENTS:   HISTORY OF PRESENT ILLNESS:  74 yo male former smoker with hx HTN, hyperlipidemia initially presented 1/2 to [place] with 10 day hx SOB.  Found to have mildly elevated troponin (0.47) and diffuse T wave inversion.  He was treated with NTG and heparin and tx to [facility] with concern for ACS.  Underwent cardiac cath 1/4 which revealed only non-critical CAD.  Ultimately underwent CTA chest which revealed bilat submassive PE.   Started on heparin gtt.  PCCM consulted for assist.  Currently feeling better, comfortable.  Has been hemodynamically stable.  Denies SOB at rest but does c/o DOE.  Also c/o BLE tenderness but no swelling.  Denies chest pain, hemoptysis, fevers.  Denies recent travel, surgeries or lifestyle changes.  Says he is sedentary at times but this has not changed significantly.   PAST MEDICAL HISTORY :   has a past medical history of Essential hypertension; Anxiety; and Hyperlipidemia with target LDL less than 70.  has past surgical history that includes Splenectomy, total and left heart catheterization with coronary  angiogram (Bilateral, 07/27/2014). Prior to Admission medications   Medication Sig Start Date End Date Taking? Authorizing Provider  cholecalciferol (VITAMIN D) 1000 UNITS tablet Take 1,000 Units by mouth daily.   Yes Historical Provider, MD  losartan-hydrochlorothiazide (HYZAAR) 50-12.5 MG per tablet Take 1 tablet by mouth daily. 07/20/14  Yes Historical Provider, MD  lovastatin (MEVACOR) 20 MG tablet Take 10 mg by mouth daily. 07/20/14  Yes Historical Provider, MD  Melatonin 5 MG TABS Take 15 mg by mouth at bedtime.   Yes Historical Provider, MD  omeprazole (PRILOSEC) 40 MG capsule Take 40 mg by mouth daily. 04/26/14  Yes Historical Provider, MD  vitamin B-12 (CYANOCOBALAMIN) 500 MCG tablet Take 500 mcg by mouth daily.   Yes Historical Provider, MD  vitamin E 1000 UNIT capsule Take 1,000 Units by mouth daily.   Yes Historical Provider, MD   No Known Allergies  FAMILY HISTORY:  has no family status information on file.  SOCIAL HISTORY:  reports that he has quit smoking. His smoking use included Cigarettes. He smoked 0.00 packs per day for 20 years. He does not have any smokeless tobacco history on file. He reports that he uses illicit drugs. He reports that he does not drink alcohol.  REVIEW OF SYSTEMS:  As per HPI - All other systems reviewed and were neg.    SUBJECTIVE:   VITAL SIGNS: Temp:  [97.7 F (36.5 C)-98.4 F (36.9 C)] 98.4 F (36.9 C) (01/06 0835) Pulse Rate:  [57-62] 62 (01/06 1101) BP: (100-143)/(51-70)  122/62 mmHg (01/06 1101) SpO2:  [94 %-100 %] 99 % (01/06 0835)  INTAKE / OUTPUT:  Intake/Output Summary (Last 24 hours) at 07/29/14 1137 Last data filed at 07/29/14 0900  Gross per 24 hour  Intake  719.1 ml  Output    300 ml  Net  419.1 ml    PHYSICAL EXAMINATION: General:  Pleasant wdwn male, NAD in bed  Neuro:  Awake, alert, appropriate, MAE  HEENT:  Mm moist, no JVD  Cardiovascular:  s1s2 rrr Lungs:  resps even non labored on 2L [place], cta  Abdomen:  Round,  soft, +bs  Musculoskeletal:  Warm and dry, BLE tenderness, scant BLE edema, no warmth/redness   LABS:  CBC  Recent Labs Lab 07/26/14 0935 07/27/14 0302 07/29/14 0133  WBC 8.4 7.2 5.6  HGB 15.7 15.2 13.8  HCT 45.1 43.3 40.2  PLT 260 240 238   Coag's  Recent Labs Lab 07/27/14 1056  INR 1.05   BMET  Recent Labs Lab 07/26/14 0935  NA 139  K 3.7  CL 106  CO2 24  BUN 12  CREATININE 1.07  GLUCOSE 133*   Electrolytes  Recent Labs Lab 07/26/14 0935  CALCIUM 9.3   Cardiac Enzymes  Recent Labs Lab 07/25/14 2010 07/26/14 0100 07/26/14 0935  TROPONINI 0.36* 0.28* 0.19*     ASSESSMENT / PLAN:  Bilateral submassive PE - hemodynamically stable with minimal RV strain on echo (d/w cards).  Respiratory status stable.  Does have significant blot burden and bilateral DVT.  Relatively unprovoked although he is fairly sedentary, this has not changed.  BLE DVT   REC -  -Cont heparin gtt  -Will need coumadin v NOAC  -No indication systemic or catheter directed thrombolytics at this time  -hypercaogulable panel pending  -Doubt needs IVC filter.  He does have large clot burden and bilat DVT but non mobile, has been on anticoagulation x 4 days, has remained hemodynamically stable with minimal RV strain  -outpt pulm f/u 1 month -outpt f/u echo  -check ambulatory desat - may need to d/c on short term O2   D/w Dr. Herbie BaltimoreHarding.   Dirk DressKaty Whiteheart, NP 07/29/2014  11:37 AM Pager: [phone] or [phone]  PCCM ATTENDING: I have reviewed pt's initial presentation, consultants notes and hospital database in detail.  The above assessment and plan was formulated under my direction.  In summary: Unprovoked PE - moderate in size. Little or no evidence of RV overload but significantly abnormal EKG on admission (not repeated). He has been evaluated for ACS which was negative (noncritical CAD). He has been on heparin since admission.  REC: No indication for systemic or local  lytic therapy at this time No indication for IVC filter at this time Hypercoag w/u Should ensure that he has had all of recommended cancer screening  Check PSA  Initiate rivaroxaban per pharmacy protocol  Duration of therapy to be determined Check LE venous duplex scans  If large or mobile clot, could consider temporary IVC filter Agree with transfer to tele If LE scans don't reveal anything worrisome, he can probably be discharged safely in next day or two so I have left on Cards service for now   Billy Fischeravid Simonds, MD;  PCCM service; Mobile [phone]

## 2014-07-30 ENCOUNTER — Telehealth: Payer: Self-pay | Admitting: Cardiology

## 2014-07-30 DIAGNOSIS — I82403 Acute embolism and thrombosis of unspecified deep veins of lower extremity, bilateral: Secondary | ICD-10-CM

## 2014-07-30 LAB — CBC
HCT: 38.2 % — ABNORMAL LOW (ref 39.0–52.0)
HEMOGLOBIN: 13.1 g/dL (ref 13.0–17.0)
MCH: 34.8 pg — ABNORMAL HIGH (ref 26.0–34.0)
MCHC: 34.3 g/dL (ref 30.0–36.0)
MCV: 101.6 fL — ABNORMAL HIGH (ref 78.0–100.0)
Platelets: 244 10*3/uL (ref 150–400)
RBC: 3.76 MIL/uL — ABNORMAL LOW (ref 4.22–5.81)
RDW: 15.7 % — AB (ref 11.5–15.5)
WBC: 6 10*3/uL (ref 4.0–10.5)

## 2014-07-30 LAB — PSA: PSA: 1.11 ng/mL (ref <=4.00)

## 2014-07-30 LAB — PROTEIN C, TOTAL: PROTEIN C, TOTAL: 90 % (ref 72–160)

## 2014-07-30 LAB — PROTEIN S, TOTAL: Protein S Ag, Total: 81 % (ref 60–150)

## 2014-07-30 MED ORDER — METOPROLOL TARTRATE 25 MG PO TABS: 12.5000 mg | ORAL_TABLET | Freq: Two times a day (BID) | ORAL | Status: DC

## 2014-07-30 MED ORDER — LOSARTAN POTASSIUM 50 MG PO TABS: 50.0000 mg | ORAL_TABLET | Freq: Every day | ORAL | Status: DC

## 2014-07-30 MED ORDER — RIVAROXABAN (XARELTO) VTE STARTER PACK (15 & 20 MG): ORAL_TABLET | ORAL | Status: DC

## 2014-07-30 MED ORDER — ATORVASTATIN CALCIUM 40 MG PO TABS: 40.0000 mg | ORAL_TABLET | Freq: Every day | ORAL | Status: DC

## 2014-07-30 MED ORDER — RIVAROXABAN 20 MG PO TABS: 20.0000 mg | ORAL_TABLET | Freq: Every day | ORAL | Status: DC

## 2014-07-30 NOTE — Discharge Summary (Signed)
Discharge Summary   Patient ID: Craig [name],  MRN: [id number], DOB/AGE: 1941-06-12 74 y.o.  Admit date: 07/25/2014 Discharge date: 07/30/2014  Primary Care Provider: Virl Cagey Primary Cardiologist: Dr. Antoine Poche (may need to establish followup later to [facility])  Discharge Diagnoses Principal Problem:   Pulmonary embolus with infarction - Bilateral Active Problems:   Essential hypertension   Hyperlipidemia with target LDL less than 70   Elevated troponin   DVT of lower extremity, bilateral   Allergies No Known Allergies  Procedures  Echocardiogram 07/27/2014 LV EF: 50% -  55%  ------------------------------------------------------------------- Indications:   Dyspnea 786.09.  ------------------------------------------------------------------- History:  PMH: NSTEMI. Coronary artery disease. Risk factors: Hypertension.  ------------------------------------------------------------------- Study Conclusions  - Left ventricle: The cavity size was normal. Wall thickness was increased in a pattern of mild LVH. Systolic function was normal. The estimated ejection fraction was in the range of 50% to 55%. Wall motion was normal; there were no regional wall motion abnormalities. Doppler parameters are consistent with abnormal left ventricular relaxation (grade 1 diastolic dysfunction). - Mitral valve: Calcified annulus. - Pulmonary arteries: PA peak pressure: 42 mm Hg (S).     Cardiac catheterization 07/27/2014 HEMODYNAMICS:   AO SYSTOLIC/AO DIASTOLIC: 114/60  LV SYSTOLIC/LV DIASTOLIC: 117/13  ANGIOGRAPHIC RESULTS:   1. Left main; normal  2. LAD; minor irregularities 3. Left circumflex; 50-60% mid AV groove circumflex prior to takeoff of a marginal branch.  4. Right coronary artery; dominant with 20-30% stenoses in the midportion 5. Left ventriculography; RAO left ventriculogram was performed using  25 mL of Visipaque dye at 12 mL/second.  The overall LVEF estimated  60 % Without wall motion abnormalities  IMPRESSION:Mr. Craig [name] has noncritical CAD with normal LV function and normal LVEDP. I do not think a circumflex lesion appears cut enough to be causing his symptoms. I am going to get a [employer] on him tomorrow to determine whether he has any lateral ischemia. If he does not, but medical therapy will be recommended. A 2-D echo cardiogram is pending as well. The etiology of his progressive dyspnea is still unclear as well as his ST segment changes. The sheath was removed and a TR band was placed on the right wrist to achieve patent hemostasis. The patient left the lab in stable condition.   Lexiscan Myoview 07/28/2014  IMPRESSION: 1. This is interpreted as a negative stress Myoview study. There is no evidence of ischemia. He has normal left ventricular systolic function. The ejection fraction is 67%.   CTA of chest  07/28/2014 1. Large volume pulmonary embolism. Positive for acute PE with CT evidence of right heartstrain (RV/LV Ratio = 1.18) consistent with at least submassive (intermediate risk)PE. The presence of right heart strain has been associated with anincreased risk of morbidity and mortality. Consultation with Pulmonary and Critical Care Medicine is recommended. These results were called by telephone at the time of interpretation on 07/28/2014 at 6:56 pm to Dr. Simona Huh, who verbally acknowledged these results. 2. Mildly motion degraded exam. 3. Atherosclerosis, including within the coronary arteries. 4. Bilateral nephrolithiasis.   Lower extremity venous doppler 07/29/2014 - Findings consistent with deep vein thrombosis involving the right peroneal vein and left posterial tibial vein. - No evidence of Baker&'s cyst on the right or left.    Hospital Course  The patient is a 74 year old male with no prior cardiac history. He presented to [facility] ED with about 10 days history of dyspnea. He denies any  chest, neck or arm pain. He recently  saw his primary M.D. and was treated with Z-Pak. His dyspnea was worse with exertion. At [facility] ED, troponin was elevated at 0.47. EKG demonstrated diffuse T-wave inversion. He was treated with nitroglycerin and heparin. On [facility], troponin was 0.36 which gradually trended down. Significant laboratory finding include sodium 139, potassium 3.7. Hemoglobin 15.7.   Given his elevated troponin, cardiac catheterization was recommended. He underwent scheduled cardiac catheterization on 07/27/2014 which showed EF 60%, 50-60% mid left circumflex stenosis, 20-30% mid RCA stenosis, minor irregularities in the LAD. Echocardiogram was obtained which showed EF 50-55%, mild LVH, no regional wall motion abnormality, grade 1 diastolic dysfunction, PA peak pressure 42 mmHg. Given his borderline left circumflex stenosis, stress test was recommended to risk stratify. He underwent stress test on 07/28/2014 which showed EF 67%, no evidence of ischemia. Given persistent dyspnea, CTA of the chest was obtained to exclude PE on 1/5 which did return positive for acute bilateral submassive PE with evidence of right heart strain. Pulmonary critical care was consulted on the same day. He appeared to be hemodynamically stable with normal O2 saturation. IV heparin was started. Per pulmonology, no indication for systemic or catheter directed thrombolytic at this time. Hypercoagulable panel was ordered. (PSA normal, protein C&S normal, Antithrombin III, anticardiolipin low) Lower extremity ultrasound showed DVT in the right peroneal vein in the left posterior tibial vein. It was felt the patient does not need IVC filter at this time. He was transferred out of the ICU on 1/6.   He was seen the morning of 07/30/2014, at which time she denies any dyspnea. He has been started on Xarelto 15 mg twice a day on the night of 1/6, he will continue on the current dose for 21 days and then started on  20 mg daily thereafter. Outpatient pulmonology follow-up has already been arranged. He is cleared from pulmonology and cardiology perspective for discharge. He does have nonobstructive coronary artery disease noted on cardiac catheterization, however given the current need for systemic anticoagulation, aspirin has been discontinued. He ambulated in the hallway with nursing staff, his O2 saturation has been consistently greater than 95% with ambulation. He is asymptomatic at this time. I will schedule follow-up with Dr. Jenene SlickerHochrein's office in 2-4 weeks after which time, it is anticipated patient will need outpatient cardiology follow-[place] in [place] office.      Discharge Vitals Blood pressure 112/60, pulse 52, temperature 97.7 F (36.5 C), temperature source Oral, resp. rate 18, height 6\' 3"  (1.905 m), weight 247 lb 5.7 oz (112.2 kg), SpO2 96 %.  Filed Weights   07/25/14 1637 07/26/14 0340 07/27/14 0317  Weight: 245 lb 9.5 oz (111.4 kg) 242 lb 15.2 oz (110.2 kg) 247 lb 5.7 oz (112.2 kg)    Labs  CBC  Recent Labs  07/29/14 0133 07/30/14 0437  WBC 5.6 6.0  HGB 13.8 13.1  HCT 40.2 38.2*  MCV 103.9* 101.6*  PLT 238 244    Disposition  Pt is being discharged home today in good condition.  Follow-up Plans & Appointments      Follow-up Information    Follow up with LAMB, Reola MosherANDREW S, MD.   Specialty:  Internal Medicine   Contact information:   [facility] [address] [place] [id number] [phone]       Follow up with Stephanie AcreMUNGAL,VISHAL, MD On 08/20/2014.   Specialty:  Internal Medicine   Why:  [facility] @2 :00pm    Contact information:   [address]  [place] [address] [phone]       Follow up with Rollene Rotunda, MD.   Specialty:  Cardiology   Why:  Office will contact you within 2 business days to schedule followup, please give [place] a call if you do not hear from [place] by Monday   Contact information:   [address] [place] [address] [phone]       Discharge Medications    Medication List    STOP taking these medications        losartan-hydrochlorothiazide 50-12.5 MG per tablet  Commonly known as:  HYZAAR     lovastatin 20 MG tablet  Commonly known as:  MEVACOR      TAKE these medications        atorvastatin 40 MG tablet  Commonly known as:  LIPITOR  Take 1 tablet (40 mg total) by mouth daily at 6 PM.     cholecalciferol 1000 UNITS tablet  Commonly known as:  VITAMIN D  Take 1,000 Units by mouth daily.     losartan 50 MG tablet  Commonly known as:  COZAAR  Take 1 tablet (50 mg total) by mouth daily.     Melatonin 5 MG Tabs  Take 15 mg by mouth at bedtime.     metoprolol tartrate 25 MG tablet  Commonly known as:  LOPRESSOR  Take 0.5 tablets (12.5 mg total) by mouth 2 (two) times daily.     omeprazole 40 MG capsule  Commonly known as:  PRILOSEC  Take 40 mg by mouth daily.     Rivaroxaban 15 & 20 MG Tbpk  Commonly known as:  XARELTO STARTER PACK  Take as directed on package: Start with one  tablet by mouth twice a day with food. On Day 21, switch to one  tablet once a day with food.     rivaroxaban 20 MG Tabs tablet  Commonly known as:  XARELTO  Take 1 tablet (20 mg total) by mouth daily with supper. To use after 08/27/2014 (after finish 1st month free xarelto)     vitamin B-12 500 MCG tablet  Commonly known as:  CYANOCOBALAMIN  Take 500 mcg by mouth daily.     vitamin E 1000 UNIT capsule  Take 1,000 Units by mouth daily.        Outstanding Labs/Studies  Monitor for bleeding while on Xarelto  Duration of Discharge Encounter   Greater than 30 minutes including physician time.  Ramond Dial PA-C Pager: [phone] 07/30/2014, 1:36 PM

## 2014-07-30 NOTE — Discharge Instructions (Addendum)
Information on my medicine - XARELTO (rivaroxaban)  This medication education was reviewed with me or my healthcare representative as part of my discharge preparation.    WHY WAS XARELTO PRESCRIBED FOR YOU? Xarelto was prescribed to treat blood clots that may have been found in the veins of your legs (deep vein thrombosis) or in your lungs (pulmonary embolism) and to reduce the risk of them occurring again.  What do you need to know about Xarelto? The starting dose is one 15 mg tablet taken TWICE daily with food for the FIRST 21 DAYS then on 1/28  the dose is changed to one 20 mg tablet taken ONCE A DAY with your evening meal.  DO NOT stop taking Xarelto without talking to the health care provider who prescribed the medication.  Refill your prescription for 20 mg tablets before you run out.  After discharge, you should have regular check-up appointments with your healthcare provider that is prescribing your Xarelto.  In the future your dose may need to be changed if your kidney function changes by a significant amount.  What do you do if you miss a dose? If you are taking Xarelto TWICE DAILY and you miss a dose, take it as soon as you remember. You may take two 15 mg tablets (total 30 mg) at the same time then resume your regularly scheduled 15 mg twice daily the next day.  If you are taking Xarelto ONCE DAILY and you miss a dose, take it as soon as you remember on the same day then continue your regularly scheduled once daily regimen the next day. Do not take two doses of Xarelto at the same time.   Important Safety Information Xarelto is a blood thinner medicine that can cause bleeding. You should call your healthcare provider right away if you experience any of the following: ? Bleeding from an injury or your nose that does not stop. ? Unusual colored urine (red or dark brown) or unusual colored stools (red or black). ? Unusual bruising for unknown reasons. ? A serious fall or if  you hit your head (even if there is no bleeding).  Some medicines may interact with Xarelto and might increase your risk of bleeding while on Xarelto. To help avoid this, consult your healthcare provider or pharmacist prior to using any new prescription or non-prescription medications, including herbals, vitamins, non-steroidal anti-inflammatory drugs (NSAIDs) and supplements.  This website has more information on Xarelto: VisitDestination.com.br.   Pulmonary Embolism    A pulmonary (lung) embolism (PE) is a blood clot that has traveled to the lung and results in a blockage of blood flow in the affected lung. Most clots come from deep veins in the legs or pelvis. PE is a dangerous and potentially life-threatening condition that can be treated if identified.  CAUSES  Blood clots form in a vein for different reasons. Usually several things cause blood clots. They include:  The flow of blood slows down.  The inside of the vein is damaged in some way.  The person has a condition that makes the blood clot more easily. RISK FACTORS  Some people are more likely than others to develop PE. Risk factors include:  Smoking.  Being overweight (obese).  Sitting or lying still for a long time. This includes long-distance travel, paralysis, or recovery from an illness or surgery. Other factors that increase risk are:  Older age, especially over 46 years of age.  Having a family history of blood clots or if you have  already had a blood clot.  Having major or lengthy surgery. This is especially true for surgery on the hip, knee, or belly (abdomen). Hip surgery is particularly high risk.  Having a long, thin tube (catheter) placed inside a vein during a medical procedure.  Breaking a hip or leg.  Having cancer or cancer treatment.  Medicines containing the male hormone estrogen. This includes birth control pills and hormone replacement therapy.  Other circulation or heart problems.  Pregnancy and childbirth.   Hormone changes make the blood clot more easily during pregnancy.  The fetus puts pressure on the veins of the pelvis.  There is a risk of injury to veins during delivery or a caesarean delivery. The risk is highest just after childbirth.  PREVENTION  Exercise the legs regularly. Take a brisk 30 minute walk every day.  Maintain a weight that is appropriate for your height.  Avoid sitting or lying in bed for long periods of time without moving your legs.  Women, particularly those over the age of 35 years, should consider the risks and benefits of taking estrogen medicines, including birth control pills.  Do not smoke, especially if you take estrogen medicines.  Long-distance travel can increase your risk. You should exercise your legs by walking or pumping the muscles every hour.  Many of the risk factors above relate to situations that exist with hospitalization, either for illness, injury, or elective surgery. Prevention may include medical and nonmedical measures.  Your health care provider will assess you for the need for venous thromboembolism prevention when you are admitted to the hospital. If you are having surgery, your surgeon will assess you the day of or day after surgery.  SYMPTOMS  The symptoms of a PE usually start suddenly and include:  Shortness of breath.  Coughing.  Coughing up blood or blood-tinged mucus.  Chest pain. Pain is often worse with deep breaths.  Rapid heartbeat. DIAGNOSIS  If a PE is suspected, your health care provider will take a medical history and perform a physical exam. Other tests that may be required include:  Blood tests, such as studies of the clotting properties of your blood.  Imaging tests, such as ultrasound, CT, MRI, and other tests to see if you have clots in your legs or lungs.  An electrocardiogram. This can look for heart strain from blood clots in the lungs. TREATMENT  The most common treatment for a PE is blood thinning (anticoagulant)  medicine, which reduces the blood's tendency to clot. Anticoagulants can stop new blood clots from forming and old clots from growing. They cannot dissolve existing clots. Your body does this by itself over time. Anticoagulants can be given by mouth, through an intravenous (IV) tube, or by injection. Your health care provider will determine the best program for you.  Less commonly, clot-dissolving medicines (thrombolytics) are used to dissolve a PE. They carry a high risk of bleeding, so they are used mainly in severe cases.  Very rarely, a blood clot in the leg needs to be removed surgically.  If you are unable to take anticoagulants, your health care provider may arrange for you to have a filter placed in a main vein in your abdomen. This filter prevents clots from traveling to your lungs. HOME CARE INSTRUCTIONS  Take all medicines as directed by your health care provider.  Learn as much as you can about DVT.  Wear a medical alert bracelet or carry a medical alert card.  Ask your health care provider how  soon you can go back to normal activities. It is important to stay active to prevent blood clots. If you are on anticoagulant medicine, avoid contact sports.  It is very important to exercise. This is especially important while traveling, sitting, or standing for long periods of time. Exercise your legs by walking or by tightening and relaxing your leg muscles regularly. Take frequent walks.  You may need to wear compression stockings. These are tight elastic stockings that apply pressure to the lower legs. This pressure can help keep the blood in the legs from clotting. Taking Warfarin  Warfarin is a daily medicine that is taken by mouth. Your health care provider will advise you on the length of treatment (usually 3-6 months, sometimes lifelong). If you take warfarin:  Understand how to take warfarin and foods that can affect how warfarin works in Public relations account executiveyour body.  Too much and too little warfarin are  both dangerous. Too much warfarin increases the risk of bleeding. Too little warfarin continues to allow the risk for blood clots. Warfarin and Regular Blood Testing  While taking warfarin, you will need to have regular blood tests to measure your blood clotting time. These blood tests usually include both the prothrombin time (PT) and international normalized ratio (INR) tests. The PT and INR results allow your health care provider to adjust your dose of warfarin. It is very important that you have your PT and INR tested as often as directed by your health care provider.  Warfarin and Your Diet  Avoid major changes in your diet, or notify your health care provider before changing your diet. Arrange a visit with a registered dietitian to answer your questions. Many foods, especially foods high in vitamin K, can interfere with warfarin and affect the PT and INR results. You should eat a consistent amount of foods high in vitamin K. Foods high in vitamin K include:  Spinach, kale, broccoli, cabbage, collard and turnip greens, Brussels sprouts, peas, cauliflower, seaweed, and parsley.  Beef and pork liver.  Green tea.  Soybean oil. Warfarin with Other Medicines  Many medicines can interfere with warfarin and affect the PT and INR results. You must:  Tell your health care provider about any and all medicines, vitamins, and supplements you take, including aspirin and other over-the-counter anti-inflammatory medicines. Be especially cautious with aspirin and anti-inflammatory medicines. Ask your health care provider before taking these.  Do not take or discontinue any prescribed or over-the-counter medicine except on the advice of your health care provider or pharmacist. Warfarin Side Effects  Warfarin can have side effects, such as easy bruising and difficulty stopping bleeding. Ask your health care provider or pharmacist about other side effects of warfarin. You will need to:  Hold pressure over cuts for  longer than usual.  Notify your dentist and other health care providers that you are taking warfarin before you undergo any procedures where bleeding may occur. Warfarin with Alcohol and Tobacco  Drinking alcohol frequently can increase the effect of warfarin, leading to excess bleeding. It is best to avoid alcoholic drinks or consume only very small amounts while taking warfarin. Notify your health care provider if you change your alcohol intake.  Do not use any tobacco products including cigarettes, chewing tobacco, or electronic cigarettes. If you smoke, quit. Ask your health care provider for help with quitting smoking. Alternative Medicines to Warfarin: Factor Xa Inhibitor Medicines  These blood thinning medicines are taken by mouth, usually for several weeks or longer. It is important to  take the medicine every single day, at the same time each day.  There are no regular blood tests required when using these medicines.  There are fewer food and drug interactions than with warfarin.  The side effects of this class of medicine is similar to that of warfarin, including excessive bruising or bleeding. Ask your health care provider or pharmacist about other potential side effects. SEEK MEDICAL CARE IF:  You notice a rapid heartbeat.  You feel weaker or more tired than usual.  You feel faint.  You notice increased bruising.  Your symptoms are not getting better in the time expected.  You are having side effects of medicine. SEEK IMMEDIATE MEDICAL CARE IF:  You have chest pain.  You have trouble breathing.  You have new or increased swelling or pain in one leg.  You cough up blood.  You notice blood in vomit, in a bowel movement, or in urine.  You have a fever. Symptoms of PE may represent a serious problem that is an emergency. Do not wait to see if the symptoms will go away. Get medical help right away. Call your local emergency services (911 in the [place]). Do not drive yourself to  the hospital.  Document Released: 07/07/2000 Document Revised: 11/24/2013 Document Reviewed: 07/21/2013  [facility] Patient Information 2015 [place], [place]. This information is not intended to replace advice given to you by your health care provider. Make sure you discuss any questions you have with your health care provider.

## 2014-07-30 NOTE — Progress Notes (Addendum)
Pt. Got d/c instructions,follow up appoinments,and prescriptions.IV was d/c.Tele was d/c.Pt. Ready to go home with his wife.

## 2014-07-30 NOTE — Telephone Encounter (Signed)
Closed encounter °

## 2014-07-30 NOTE — Progress Notes (Signed)
    Subjective:  Denies CP or dyspnea   Objective:  Filed Vitals:   07/29/14 1600 07/29/14 1732 07/29/14 2157 07/30/14 0433  BP:  114/69 124/60 112/60  Pulse:  51 56 52  Temp:  98.5 F (36.9 C) 97.8 F (36.6 C) 97.7 F (36.5 C)  TempSrc:  Oral Oral Oral  Resp:  19 18 18   Height:  6\' 3"  (1.905 m)    Weight:      SpO2: 96% 100% 97% 51%    Intake/Output from previous day:  Intake/Output Summary (Last 24 hours) at 07/30/14 0840 Last data filed at 07/29/14 1700  Gross per 24 hour  Intake    596 ml  Output    300 ml  Net    296 ml    Physical Exam: Physical exam: Well-developed well-nourished in no acute distress.  Skin is warm and dry.  HEENT is normal.  Neck is supple.  Chest is clear to auscultation with normal expansion.  Cardiovascular exam is regular rate and rhythm.  Abdominal exam nontender or distended. No masses palpated. Extremities show no edema. neuro grossly intact    Lab Results: CBC:  Recent Labs  07/29/14 0133 07/30/14 0437  WBC 5.6 6.0  HGB 13.8 13.1  HCT 40.2 38.2*  MCV 103.9* 101.6*  PLT 238 244     Assessment/Plan:  1 pulmonary embolus-patient denies dyspnea. He is noted to also have DVTs on lower extremity Dopplers. Pulmonary has seen and does not recommend IVC filter. Continue xarelto 15 mg BID for 3 weeks and then 20 mg daily thereafter. He will need close follow-up with pulmonary following discharge. 2 coronary artery disease-noted on catheterization. Continue statin. Discontinue aspirin given the for anticoagulation. 3 hypertension-continue present blood pressure medications. 4 hyperlipidemia-continue statin. Plan ambulate today. If patient asymptomatic discharge and follow-up with Dr. Antoine PocheHochrein for CAD and pulmonary for pulmonary embolus. > 30 min PA and physician time D2 Olga MillersBrian Crenshaw 07/30/2014, 8:40 AM

## 2014-07-30 NOTE — Progress Notes (Signed)
PULMONARY / CRITICAL CARE MEDICINE   Name: Craig [name] MRN: [id number] DOB: 01/04/1941    ADMISSION DATE:  07/25/2014 CONSULTATION DATE:  07/29/13  REFERRING MD :  Herbie BaltimoreHarding   CHIEF COMPLAINT:  PE   INITIAL PRESENTATION: 74 yo male with hx HTN, hyperlipidemia initially presented 1/2 to [place] with 10 day hx SOB.  Found to have mildly elevated troponin (0.47) and diffuse T wave inversion.  He [facility] treated with NTG and heparin and [facility].  Underwent cardiac cath 1/4 which revealed only non-critical CAD.  Ultimately underwent CTA chest which revealed bilat submassive PE.   STUDIES:  Cath 1/4 >>>noncritical CAD, normal LV function, L circumflex 50-60% 2D echo >>>EF 50-55%, no RV dilation, PASP 42mmHg, grade 1 diastolic dysfunction  CTA chest 1/5 >>> Bilat submassive PE  BLE dopplers 1/6>>> POS DVT BLE, no mention of mobile clot   SIGNIFICANT EVENTS:   SUBJECTIVE:   VITAL SIGNS: Temp:  [97.7 F (36.5 C)-98.5 F (36.9 C)] 97.7 F (36.5 C) (01/07 0433) Pulse Rate:  [51-62] 52 (01/07 0433) Resp:  [18-19] 18 (01/07 0433) BP: (112-124)/(58-69) 112/60 mmHg (01/07 0433) SpO2:  [51 %-100 %] 51 % (01/07 0433)  INTAKE / OUTPUT:  Intake/Output Summary (Last 24 hours) at 07/30/14 0853 Last data filed at 07/29/14 1700  Gross per 24 hour  Intake    596 ml  Output    300 ml  Net    296 ml    PHYSICAL EXAMINATION: General:  Pleasant wdwn male, NAD in bed  Neuro:  Awake, alert, appropriate, MAE  HEENT:  Mm moist, no JVD  Cardiovascular:  s1s2 rrr Lungs:  resps even non labored on 2L [place], cta  Abdomen:  Round, soft, +bs  Musculoskeletal:  Warm and dry, BLE tenderness, scant BLE edema, no warmth/redness   LABS:  CBC  Recent Labs Lab 07/27/14 0302 07/29/14 0133 07/30/14 0437  WBC 7.2 5.6 6.0  HGB 15.2 13.8 13.1  HCT 43.3 40.2 38.2*  PLT 240 238 244   Coag's  Recent Labs Lab 07/27/14 1056  INR 1.05   BMET  Recent Labs Lab 07/26/14 0935  NA 139   K 3.7  CL 106  CO2 24  BUN 12  CREATININE 1.07  GLUCOSE 133*   Electrolytes  Recent Labs Lab 07/26/14 0935  CALCIUM 9.3   Cardiac Enzymes  Recent Labs Lab 07/25/14 2010 07/26/14 0100 07/26/14 0935  TROPONINI 0.36* 0.28* 0.19*     ASSESSMENT / PLAN:  Bilateral submassive PE - hemodynamically stable with minimal RV strain on echo (d/w cards) but fairly significant EKG changes on admission.  Has had ACS w/u which was negative.  Respiratory status stable.  Does have significant blot burden and bilateral DVT.  Relatively unprovoked although he is fairly sedentary, this has not changed.   BLE DVT    REC -  -cont Rivaroxaban per pharmacy - will need to determine duration of therapy. Would say minimum 6 months-1 year  -No indication systemic or catheter directed thrombolytics at this time  -hypercaogulable panel pending  -No indication for IVC filter with relatively distal DVT's  -outpt pulm f/u 1 month -- arranged  -outpt f/u echo per cards  -ambulated well on RA, sats 97%, no need home O2 -ensure age appropriate cancer screenings, psa wnl  Ok for d/c home from pulmonary standpoint.  Will f/u as outpt.   Dirk DressKaty Whiteheart, NP 07/30/2014  8:53 AM Pager: [phone]) [phone] or [phone]  Attending:  I have seen and examined the patient with nurse practitioner/resident and agree with the note above.   Lungs clear CV exam normal Feeling well  Plan Xarelto, I agree I told him to get his colonoscopy with Dr. Jerre Simon as this is due this year and he needs to establish care with a PCP in [place]. I recommended [facility] based on where he lives.  PCCM to sign off  Heber [place], MD [place] [facility] Pager: [phone] Cell: [phone] If no response, call [phone]

## 2014-07-30 NOTE — Progress Notes (Signed)
Pt. Ambulate on hallway for about 5 min.Oxigen saturation before he walk was 96.After walk pt. Did not showed any exertion,PO2 sat 97 post walk.Keep monitoring pt. Closely and assessing his needs,

## 2014-07-31 LAB — LUPUS ANTICOAGULANT PANEL
DRVVT: 27.6 secs (ref <42.9)
LUPUS ANTICOAGULANT: NOT DETECTED
PTT LA: 70.1 s — AB (ref 28.0–43.0)
PTTLA 41 MIX: 82.1 s — AB (ref 28.0–43.0)
PTTLA CONFIRMATION: 2.8 s (ref <8.0)

## 2014-07-31 LAB — CARDIOLIPIN ANTIBODIES, IGG, IGM, IGA
Anticardiolipin IgA: 8 APL U/mL — ABNORMAL LOW (ref <22)
Anticardiolipin IgG: 11 GPL U/mL — ABNORMAL LOW (ref <23)
Anticardiolipin IgM: 10 MPL U/mL — ABNORMAL LOW (ref <11)

## 2014-07-31 LAB — BETA-2-GLYCOPROTEIN I ABS, IGG/M/A
Beta-2 Glyco I IgG: 6 G Units (ref <20)
Beta-2-Glycoprotein I IgA: 4 A Units (ref <20)
Beta-2-Glycoprotein I IgM: 4 M Units (ref <20)

## 2014-08-03 LAB — PROTHROMBIN GENE MUTATION

## 2014-08-03 LAB — FACTOR 5 LEIDEN

## 2014-08-04 LAB — HOMOCYSTEINE: Homocysteine: 18.5 umol/L — ABNORMAL HIGH (ref 0.0–15.0)

## 2014-08-06 LAB — PROTEIN C ACTIVITY: Protein C Activity: 144 % — ABNORMAL HIGH (ref 75–133)

## 2014-08-06 LAB — PROTEIN S ACTIVITY: Protein S Activity: 100 % (ref 69–129)

## 2014-08-20 ENCOUNTER — Ambulatory Visit (INDEPENDENT_AMBULATORY_CARE_PROVIDER_SITE_OTHER): Payer: Medicare PPO | Admitting: Internal Medicine

## 2014-08-20 ENCOUNTER — Encounter: Payer: Self-pay | Admitting: Internal Medicine

## 2014-08-20 VITALS — BP 126/70 | HR 95 | Temp 97.9°F | Ht 77.0 in | Wt 268.6 lb

## 2014-08-20 DIAGNOSIS — I82403 Acute embolism and thrombosis of unspecified deep veins of lower extremity, bilateral: Secondary | ICD-10-CM

## 2014-08-20 DIAGNOSIS — I2699 Other pulmonary embolism without acute cor pulmonale: Secondary | ICD-10-CM

## 2014-08-20 NOTE — Assessment & Plan Note (Addendum)
Bilateral Pulmonary Embolus  Major Risk factor - sedentary lifestyle, former smoker Unprovoked PE  Plan: - cont with Xarelto for a total of 6 months since this is first PE/DVT - will check another CTA Chest in 3 months - ECHO with elevated RVSP, cardiology will repeat ECHO - lupus anticoagulant elevate - this can be seen in an acute inflammatory phase, will repeat study in 3 months.  If LAC is still high at that time, will consider Heme\Onc referral and possibility of CT abd\pelvis (especially if PE is still present, then malignancy is high on the differential for DVT\PE).

## 2014-08-20 NOTE — Assessment & Plan Note (Signed)
First DVT Same plan as PE - see plan for PE

## 2014-08-20 NOTE — Progress Notes (Signed)
Date: 08/20/2014  MRN# [id number] Craig [name] May 15, 1941  Referring Physician: Hospital Follow-up PMD:Dr.  MAXIMUS HOFFERT is a 74 y.o. old male seen in consultation for bilateral pulmonary embolism and bilateral lower extremity deep venous thrombosis.  CC:  Chief Complaint  Patient presents with  . Advice Only    pt referred for hospital f/u. PE/DVT pt c/o cough with min mucus/ pt has heartburn.     HPI:  Patient presents today for a hospital follow up visit after being diagnosis of bilateral pulmonary embolus, and bilateral lower extremity DVTs.  He was recently hospitalized for NSTEMI and found to have DVT\PE, started on Xarelto and asked to follow up with Pulmonary. No cough, no hemoptysis, mild leg swelling. She states that he lives a fairly sedentary lifestyle, no recent surgeries, no family history of tumor, malignancy, clotting disorders. Patient states that when he was younger he smoked for about 20 years, 1 pack per day, and has quit over 30 years ago. Review of his current medication list shows no medications that would make him prone to clotting. 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    Hospital Chart Review Hospitalization 07/25/14-07/30/14 INITIAL PRESENTATION: 74 yo male with hx HTN, hyperlipidemia initially presented 1/2 to [place] with 10 day hx SOB. Found to have mildly elevated troponin (0.47) and diffuse T wave inversion. He was treated with NTG and heparin and tx to [facility] with concern for ACS. Underwent cardiac cath 1/4 which revealed only non-critical CAD. Ultimately underwent CTA chest which revealed bilat submassive PE.   STUDIES:  Cath 1/4 >>>noncritical CAD, normal LV function, [name] circumflex 50-60% 2D echo >>>EF 50-55%, no RV dilation, PASP , grade 1 diastolic dysfunction  CTA chest 1/5 >>> Bilat submassive PE  BLE dopplers 1/6>>> POS DVT BLE, no mention of mobile clot    Plan: Bilateral submassive PE - hemodynamically stable with minimal RV strain on echo (d/w cards) but fairly significant EKG changes on admission. Has had ACS w/u which was negative. Respiratory status stable. Does have significant blot burden and bilateral DVT. Relatively unprovoked although he is fairly sedentary, this has not changed.  BLE DVT  REC -  -cont Rivaroxaban per pharmacy - will need to determine duration of therapy. Would say minimum 6 months-1 year  -No indication systemic or catheter directed thrombolytics at this time  -hypercaogulable panel pending  -No indication for IVC filter with relatively distal DVT's  -outpt pulm f/u 1 month -- arranged  -outpt f/u echo per cards  -ambulated well on RA, sats 97%, no need home O2 -ensure age appropriate cancer screenings, psa wnl  PMHX:   Past Medical History  Diagnosis Date  . Essential hypertension   . Anxiety   . Hyperlipidemia with target LDL less than 70    Surgical Hx:  Past Surgical History  Procedure Laterality Date  . Splenectomy, total      TTP  . Left heart catheterization with coronary angiogram Bilateral 07/27/2014    Procedure: LEFT HEART CATHETERIZATION WITH CORONARY ANGIOGRAM;  Surgeon: Runell Gess, MD;  Location: [facility] CATH LAB;  Service: Cardiovascular;  Laterality: Bilateral;   Family Hx:  Family History  Problem Relation Age of Onset  . CAD Father 67  . CAD Brother    Social Hx:   History  Substance Use Topics  . Smoking status: Former Smoker -- 20 years    Types: Cigarettes  .  Smokeless tobacco: Not on file     Comment: Quit tobacco in 1980  . Alcohol Use: No     Comment: quit 30 years ago   Medication:   Current Outpatient Rx  Name  Route  Sig  Dispense  Refill  . atorvastatin (LIPITOR) 40 MG tablet   Oral   Take 1 tablet (40 mg total) by mouth daily at 6 PM.   30 tablet   5   . cholecalciferol (VITAMIN D) 1000 UNITS tablet   Oral   Take 1,000 Units by mouth daily.          Marland Kitchen losartan (COZAAR) 50 MG tablet   Oral   Take 1 tablet (50 mg total) by mouth daily.   30 tablet   5   . Melatonin 5 MG TABS   Oral   Take 15 mg by mouth at bedtime.         . metoprolol tartrate (LOPRESSOR) 25 MG tablet   Oral   Take 0.5 tablets (12.5 mg total) by mouth 2 (two) times daily.   60 tablet   5   . omeprazole (PRILOSEC) 40 MG capsule   Oral   Take 40 mg by mouth daily.      0   . Rivaroxaban (XARELTO STARTER PACK) 15 & 20 MG TBPK      Take as directed on package: Start with one  tablet by mouth twice a day with food. On Day 21, switch to one  tablet once a day with food.   51 each   0   . rivaroxaban (XARELTO) 20 MG TABS tablet   Oral   Take 1 tablet (20 mg total) by mouth daily with supper. To use after 08/27/2014 (after finish 1st month free xarelto)   30 tablet   10     To use after 08/27/2014 (after finish 1st month free ...   . vitamin B-12 (CYANOCOBALAMIN) 500 MCG tablet   Oral   Take 500 mcg by mouth daily.         . vitamin E 1000 UNIT capsule   Oral   Take 1,000 Units by mouth daily.             Allergies:  Review of patient's allergies indicates no known allergies.  Review of Systems: Gen:  Denies  fever, sweats, chills HEENT: Denies blurred vision, double vision, ear pain, eye pain, hearing loss, nose bleeds, sore throat Cvc:  No dizziness, chest pain or heaviness Resp:   Denies cough or sputum porduction, shortness of breath Gi: Denies swallowing difficulty, stomach pain, nausea or vomiting, diarrhea, constipation, bowel incontinence Gu:  Denies bladder incontinence, burning urine Ext:   No Joint pain, stiffness or swelling Skin: No skin rash, easy bruising or bleeding or hives Endoc:  No polyuria, polydipsia , polyphagia or weight change Psych: No depression, insomnia or hallucinations  Other:  All other systems negative  Physical Examination:   VS: BP 126/70 mmHg  Pulse 95  Temp(Src) 97.9 F (36.6 C)  (Oral)  Ht  (1.956 m)  Wt 268 lb 9.6 oz (121.836 kg)  BMI 31.84 kg/m2  SpO2 96%  General Appearance: No distress  Neuro:without focal findings, mental status, speech normal, alert and oriented, cranial nerves 2-12 intact, reflexes normal and symmetric, sensation grossly normal  HEENT: PERRLA, EOM intact, no ptosis, no other lesions noticed; Mallampati 3 Pulmonary: normal breath sounds., diaphragmatic excursion normal.No wheezing, No rales;   Sputum Production: none  CardiovascularNormal S1,S2.  No m/r/g.  Abdominal aorta pulsation normal.    Abdomen: Benign, Soft, non-tender, No masses, hepatosplenomegaly, No lymphadenopathy Renal:  No costovertebral tenderness  GU:  No performed at this time. Endoc: No evident thyromegaly, no signs of acromegaly or Cushing features Skin:   warm, no rashes, no ecchymosis  Extremities: normal, no cyanosis, clubbing, mild leg swelling (nonpitting edema)   Labs results:  Results for Craig [name], Craig [name] (MRN [id number]) as of 08/20/2014 13:51  Ref. Range 07/29/2014 12:20  Anticardiolipin IgA Latest Range: <22 APL U/mL 8 ([name])  Anticardiolipin IgG Latest Range: <23 GPL U/mL 11 ([name])  Anticardiolipin IgM Latest Range: <11 MPL U/mL 10 ([name])  PTT Lupus Anticoagulant Latest Range: 28.0-43.0 secs 70.1 (H)  PTTLA Confirmation Latest Range: <8.0 secs 2.8  PTTLA 4:1 Mix Latest Range: 28.0-43.0 secs 82.1 (H)  DRVVT Latest Range: <42.9 secs 27.6  Drvvt confirmation Latest Range: <1.15 Ratio NOT APPL  dRVVT Incubated 1:1 Mix Latest Range: <42.9 secs NOT APPL  Lupus Anticoagulant Latest Range: NOT DETECTED  NOT DETECTED   Rad results: (The following images and results were reviewed by Dr. Dema SeverinMungal). CTA Chest 07/28/14 FINDINGS: Lungs/Pleura: Minimal motion degradation throughout. Mild centrilobular emphysema.  No pleural fluid.  Heart/Mediastinum: The quality of this examination for evaluation of pulmonary embolism is sufficient. Large volume bilateral pulmonary emboli,  including a throughout the right-sided lobar branches and primarily left-sided lower lobe segmental and subsegmental branches. LV to RV ratio is increased at 1.18.  Normal caliber of the thoracic aorta, which is atherosclerotic. Mild cardiomegaly. Coronary artery atherosclerosis. No mediastinal or hilar adenopathy.  Upper Abdomen: Splenectomy. Normal imaged portions of the liver, stomach, pancreas. Cholelithiasis. Normal imaged portions of the adrenal glands. Bilateral nephrolithiasis. Upper pole left renal cyst with bilateral too small to characterize renal lesions.  Bones/Musculoskeletal: No acute osseous abnormality.  Review of the MIP images confirms the above findings.  IMPRESSION: 1. Large volume pulmonary embolism. Positive for acute PE with CT evidence of right heartstrain (RV/LV Ratio = 1.18) consistent with at least submassive (intermediate risk)PE. The presence of right heart strain has been associated with anincreased risk of morbidity and mortality. Consultation with Pulmonary and Critical Care Medicine is recommended. These results were called by telephone at the time of interpretation on 07/28/2014 at 6:56 pm to Dr. Simona HuhSam McDowell, who verbally acknowledged these results. 2. Mildly motion degraded exam. 3. Atherosclerosis, including within the coronary arteries. 4. Bilateral nephrolithiasis.   ECHO 07/27/14: Study Conclusions  - Left ventricle: The cavity size was normal. Wall thickness was increased in a pattern of mild LVH. Systolic function was normal. The estimated ejection fraction was in the range of 50% to 55%. Wall motion was normal; there were no regional wall motion abnormalities. Doppler parameters are consistent with abnormal left ventricular relaxation (grade 1 diastolic dysfunction). - Mitral valve: Calcified annulus. - Pulmonary arteries: PA peak pressure: 42 mm Hg (S).     Other:  LE Dopplers 07/29/14 Summary:  - Findings  consistent with deep vein thrombosis involving the right peroneal vein and left posterial tibial vein. - No evidence of Baker&'s cyst on the right or left.  Assessment and Plan: Pulmonary embolus with infarction - Bilateral Bilateral Pulmonary Embolus  Major Risk factor - sedentary lifestyle, former smoker Unprovoked PE  Plan: - cont with Xarelto for a total of 6 months since this is first PE/DVT - will check another CTA Chest in 3 months - ECHO with elevated RVSP, cardiology will repeat ECHO - lupus anticoagulant elevated -  this can be seen in an acute inflammatory phase, will repeat study in 3 months.  If LAC is still high at that time, will consider Heme\Onc referral and possibility of CT abd\pelvis (especially if PE is still present, then malignancy is high on the differential for DVT\PE).    DVT of lower extremity, bilateral First DVT Same plan as PE - see plan for PE     Updated Medication List Outpatient Encounter Prescriptions as of 08/20/2014  Medication Sig  . atorvastatin (LIPITOR) 40 MG tablet Take 1 tablet (40 mg total) by mouth daily at 6 PM.  . cholecalciferol (VITAMIN D) 1000 UNITS tablet Take 1,000 Units by mouth daily.  Marland Kitchen losartan (COZAAR) 50 MG tablet Take 1 tablet (50 mg total) by mouth daily.  . Melatonin 5 MG TABS Take 15 mg by mouth at bedtime.  . metoprolol tartrate (LOPRESSOR) 25 MG tablet Take 0.5 tablets (12.5 mg total) by mouth 2 (two) times daily.  Marland Kitchen omeprazole (PRILOSEC) 40 MG capsule Take 40 mg by mouth daily.  . Rivaroxaban (XARELTO STARTER PACK) 15 & 20 MG TBPK Take as directed on package: Start with one  tablet by mouth twice a day with food. On Day 21, switch to one  tablet once a day with food.  . rivaroxaban (XARELTO) 20 MG TABS tablet Take 1 tablet (20 mg total) by mouth daily with supper. To use after 08/27/2014 (after finish 1st month free xarelto)  . vitamin B-12 (CYANOCOBALAMIN) 500 MCG tablet Take 500 mcg by mouth daily.  .  vitamin E 1000 UNIT capsule Take 1,000 Units by mouth daily.    Orders for this visit: Orders Placed This Encounter  Procedures  . CT Angio Chest PE W/Cm &/Or Wo Cm    Standing Status: Future     Number of Occurrences:      Standing Expiration Date: 11/19/2015    Scheduling Instructions:     Please schedule 1st of April= 3 mos from last CT. [facility]    Order Specific Question:  Reason for Exam (SYMPTOM  OR DIAGNOSIS REQUIRED)    Answer:  PE    Order Specific Question:  Preferred imaging location?    Answer:   [facility]  . Lupus anticoagulant panel    Lab is to be done along with CT chest around 10/23/2014    Standing Status: Future     Number of Occurrences:      Standing Expiration Date: 08/21/2015     Thank  you for the consultation and for allowing [facility] to assist in the care of your patient. Our recommendations are noted above.  Please contact us if we can be of further service.   Stephanie Acre, MD [facility] Office Number: [phone]

## 2014-08-20 NOTE — Patient Instructions (Signed)
Follow up with Dr. Dema SeverinMungal in 3 months  Bilateral Pulmonary Embolus/Bilateral DVT - continue with Xarelto for a total of 6months, unless directed to change by physician only. - repeat CT Chest in 3 months - repeat labs in 3 months - follow up with your cardiologist - follow up with your PMD

## 2014-08-28 ENCOUNTER — Encounter: Payer: Self-pay | Admitting: Cardiology

## 2014-08-28 ENCOUNTER — Ambulatory Visit (INDEPENDENT_AMBULATORY_CARE_PROVIDER_SITE_OTHER): Payer: Medicare PPO | Admitting: Cardiology

## 2014-08-28 VITALS — BP 132/66 | HR 52 | Ht 76.0 in | Wt 270.2 lb

## 2014-08-28 DIAGNOSIS — I2699 Other pulmonary embolism without acute cor pulmonale: Secondary | ICD-10-CM

## 2014-08-28 NOTE — Progress Notes (Signed)
HPI  The patient presents for follow-up of pulmonary embolism. He recently had dyspnea with an elevated troponin. He underwent cardiac catheterization and was found to have some nonobstructive disease.  Stress perfusion study was unremarkable. He did have an echo with some very mildly elevated pulmonary pressures. He subsequently was referred for a CT which demonstrated bilateral pulmonary emboli.  He also have DVT in both of his legs.  He has had a hypercoagulable workup.  He did have a elevated lupus anticoagulant.  He returns for follow-up. He is feeling well. He denies any symptoms and his shortness of breath has resolved. He does have some chronic bilateral lower extremity swelling. He is not having any chest pressure, neck or arm discomfort. He's not having any palpitations, presyncope or syncope. He has no PND or orthopnea. He's had no weight gain.   No Known Allergies  Current Outpatient Prescriptions  Medication Sig Dispense Refill  . atorvastatin (LIPITOR) 40 MG tablet Take 1 tablet (40 mg total) by mouth daily at 6 PM. 30 tablet 5  . cholecalciferol (VITAMIN D) 1000 UNITS tablet Take 1,000 Units by mouth daily.    Marland Kitchen. losartan (COZAAR) 50 MG tablet Take 1 tablet (50 mg total) by mouth daily. 30 tablet 5  . Melatonin 5 MG TABS Take 15 mg by mouth at bedtime.    . metoprolol tartrate (LOPRESSOR) 25 MG tablet Take 0.5 tablets (12.5 mg total) by mouth 2 (two) times daily. 60 tablet 5  . omeprazole (PRILOSEC) 40 MG capsule Take 40 mg by mouth daily.  0  . rivaroxaban (XARELTO) 20 MG TABS tablet Take 1 tablet (20 mg total) by mouth daily with supper. To use after 08/27/2014 (after finish 1st month free xarelto) 30 tablet 10  . vitamin B-12 (CYANOCOBALAMIN) 500 MCG tablet Take 500 mcg by mouth daily.    . vitamin E 1000 UNIT capsule Take 1,000 Units by mouth daily.     No current facility-administered medications for this visit.    Past Medical History  Diagnosis Date  . Essential  hypertension   . Anxiety   . Hyperlipidemia with target LDL less than 70     Past Surgical History  Procedure Laterality Date  . Splenectomy, total      TTP  . Left heart catheterization with coronary angiogram Bilateral 07/27/2014    Procedure: LEFT HEART CATHETERIZATION WITH CORONARY ANGIOGRAM;  Surgeon: Runell GessJonathan J Berry, MD;  [facility];  Service: Cardiovascular;  Laterality: Bilateral;    ROS:  As stated in the HPI and negative for all other systems.   PHYSICAL EXAM BP 132/66 mmHg  Pulse 52  Ht 6\' 4"  (1.93 m)  Wt 270 lb 3.2 oz (122.562 kg)  BMI 32.90 kg/m2 GENERAL:  Well appearing HEENT:  Pupils equal round and reactive, fundi not visualized, oral mucosa unremarkable NECK:  No jugular venous distention, waveform within normal limits, carotid upstroke brisk and symmetric, no bruits, no thyromegaly LYMPHATICS:  No cervical, inguinal adenopathy LUNGS:  Clear to auscultation bilaterally BACK:  No CVA tenderness CHEST:  Unremarkable HEART:  PMI not displaced or sustained,S1 and S2 within normal limits, no S3, no S4, no clicks, no rubs, no murmurs ABD:  Flat, positive bowel sounds normal in frequency in pitch, no bruits, no rebound, no guarding, no midline pulsatile mass, no hepatomegaly, no splenomegaly EXT:  2 plus pulses throughout, moderate bilateral leg edema, no cyanosis no clubbing SKIN:  No rashes no nodules NEURO:  Cranial nerves II  through XII grossly intact, motor grossly intact throughout [facility]:  Cognitively intact, oriented to person place and time  ASSESSMENT AND PLAN   PE:  The patient is feeling well. I will repeat an echocardiogram at the next visit to evaluate his pulmonary pressures which were mildly elevated. I agree with continuing his anticoagulation for 6 months. I would agree with repeat hypercoagulable workup as suggested by Dr. Dema Severin.  I would have a low threshold for hematology referral prior to discontinuing his anticoagulation given his  history also of TTP and what seems to be an unprovoked pulmonary embolism.  EDEMA:  We talked about conservative therapy.  HTN:  The blood pressure is at target. No change in medications is indicated. We will continue with therapeutic lifestyle changes (TLC).  Hospital records reviewed.

## 2014-08-28 NOTE — Patient Instructions (Signed)
Your physician recommends that you schedule a follow-up appointment in: 3 months with Dr. Hochrein  

## 2014-08-31 ENCOUNTER — Inpatient Hospital Stay: Payer: Medicare PPO | Admitting: Adult Health

## 2014-09-08 ENCOUNTER — Telehealth: Payer: Self-pay | Admitting: Cardiology

## 2014-09-08 ENCOUNTER — Other Ambulatory Visit: Payer: Self-pay

## 2014-09-08 MED ORDER — RIVAROXABAN 20 MG PO TABS: 20.0000 mg | ORAL_TABLET | Freq: Every day | ORAL | Status: DC

## 2014-09-08 NOTE — Telephone Encounter (Signed)
New message      Want xarelto 20mg  [facility]----pt has refills on a 30day supply but he want a 90day supply.

## 2014-09-10 ENCOUNTER — Other Ambulatory Visit: Payer: Self-pay

## 2014-09-13 MED ORDER — RIVAROXABAN 20 MG PO TABS: 20.0000 mg | ORAL_TABLET | Freq: Every day | ORAL | Status: DC

## 2014-09-14 ENCOUNTER — Other Ambulatory Visit: Payer: Self-pay

## 2014-10-28 ENCOUNTER — Ambulatory Visit
Admit: 2014-10-28 | Disposition: A | Discharge status: Home / Self Care | Payer: Self-pay | Attending: Internal Medicine | Admitting: Internal Medicine

## 2014-11-18 ENCOUNTER — Ambulatory Visit (INDEPENDENT_AMBULATORY_CARE_PROVIDER_SITE_OTHER): Payer: Medicare PPO | Admitting: Internal Medicine

## 2014-11-18 ENCOUNTER — Encounter: Payer: Self-pay | Admitting: Internal Medicine

## 2014-11-18 VITALS — BP 120/76 | HR 56 | Temp 97.7°F | Ht 74.0 in | Wt 268.0 lb

## 2014-11-18 DIAGNOSIS — I2699 Other pulmonary embolism without acute cor pulmonale: Secondary | ICD-10-CM

## 2014-11-18 DIAGNOSIS — I82403 Acute embolism and thrombosis of unspecified deep veins of lower extremity, bilateral: Secondary | ICD-10-CM | POA: Diagnosis not present

## 2014-11-18 NOTE — Assessment & Plan Note (Signed)
First DVT Same plan as PE - see plan for PE

## 2014-11-18 NOTE — Patient Instructions (Addendum)
Follow up with Dr. Dema SeverinMungal in August 2016 - your Pulmonary Embolus is clearing well, based on current CT Chest, there is a mild residual clot on the right - we will repeat one lab value prior to your follow up - repeat  PTT lupus anticoagulant - continue with Xarelto until follow up with Pulmonary - keep appointment with cardiology.

## 2014-11-18 NOTE — Assessment & Plan Note (Signed)
Bilateral Pulmonary Embolus - now resolving well, mild residual scarring noted in the right lower lobe, from current CT  Major Risk factor - sedentary lifestyle, former smoker Unprovoked PE  Plan: - cont with Xarelto for a total of 6 months since this is first PE/DVT - plan to stop 02/22/2015 - ECHO with elevated RVSP, cardiology will repeat ECHO - lupus anticoagulant elevate - this can be seen in an acute inflammatory phase, will repeat study prior to follow up visit. If LAC is still high at that time, will consider Heme\Onc referral and possibility of CT abd\pelvis (especially if PE is still present, then malignancy is high on the differential for DVT\PE). - encourage mobility and exercise.

## 2014-11-18 NOTE — Progress Notes (Signed)
MRN# [id number] Craig [name] 04/12/1941   CC: Chief Complaint  Patient presents with  . Follow-up    3 mos f/u CT, Echo      Brief History: 08/20/14 HPI Patient presents today for a hospital follow up visit after being diagnosis of bilateral pulmonary embolus, and bilateral lower extremity DVTs. He was recently hospitalized for NSTEMI and found to have DVT\PE, started on Xarelto and asked to follow up with Pulmonary. No cough, no hemoptysis, mild leg swelling. She states that he lives a fairly sedentary lifestyle, no recent surgeries, no family history of tumor, malignancy, clotting disorders. Patient states that when he was younger he smoked for about 20 years, 1 pack per day, and has quit over 30 years ago. Review of his current medication list shows no medications that would make him prone to clotting. 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 Plan - anticoagulation for 6 months, repeat echo by cards, repeat lupus AC at follow up visit   Events since last clinic visit: No significant events since his last visit.  No shortness of breath, no worsening dyspnea on exertion. Still with relative sedentary lifestyle.    Medication:   Current Outpatient Rx  Name  Route  Sig  Dispense  Refill  . atorvastatin (LIPITOR) 40 MG tablet   Oral   Take 1 tablet (40 mg total) by mouth daily at 6 PM.   30 tablet   5   . cholecalciferol (VITAMIN D) 1000 UNITS tablet   Oral   Take 1,000 Units by mouth daily.         Marland Kitchen losartan (COZAAR) 50 MG tablet   Oral   Take 1 tablet (50 mg total) by mouth daily.   30 tablet   5   . Melatonin 5 MG TABS   Oral   Take 15 mg by mouth at bedtime.         . metoprolol tartrate (LOPRESSOR) 25 MG tablet   Oral   Take 0.5 tablets (12.5 mg total) by mouth 2 (two) times daily.   60 tablet   5   . omeprazole (PRILOSEC) 40 MG capsule   Oral   Take 40 mg by mouth  daily.      0   . rivaroxaban (XARELTO) 20 MG TABS tablet   Oral   Take 1 tablet (20 mg total) by mouth daily with supper. To use after 08/27/2014 (after finish 1st month free xarelto)   90 tablet   1   . vitamin B-12 (CYANOCOBALAMIN) 500 MCG tablet   Oral   Take 500 mcg by mouth daily.         . vitamin E 1000 UNIT capsule   Oral   Take 1,000 Units by mouth daily.            Review of Systems: Gen:  Denies  fever, sweats, chills HEENT: Denies blurred vision, double vision, ear pain, eye pain, hearing loss, nose bleeds, sore throat Cvc:  No dizziness, chest pain or heaviness Resp:   Admits to: Gi: Denies swallowing difficulty, stomach pain, nausea or vomiting, diarrhea, constipation, bowel incontinence Gu:  Denies bladder incontinence, burning urine Ext:   No Joint pain, stiffness or swelling Skin: No skin rash, easy bruising or bleeding or hives Endoc:  No polyuria, polydipsia , polyphagia or weight change Other:  All other systems negative  Allergies:  Review of patient's  allergies indicates no known allergies.  Physical Examination:  VS: BP 120/76 mmHg  Pulse 56  Temp(Src) 97.7 F (36.5 C) (Oral)  Ht 6\' 2"  (1.88 m)  Wt 268 lb (121.564 kg)  BMI 34.39 kg/m2  SpO2 94%  General Appearance: No distress  HEENT: PERRLA, no ptosis, no other lesions noticed Pulmonary:normal breath sounds., diaphragmatic excursion normal.No wheezing, No rales   Cardiovascular:  Normal S1,S2.  No m/r/g.     Abdomen:Exam: Benign, Soft, non-tender, No masses  Skin:   warm, no rashes, no ecchymosis  Extremities: normal, no cyanosis, clubbing, warm with normal capillary refill. Mild nonpitting edema to the ankles bilaterally    Rad results: (The following images and results were reviewed by Dr. Dema SeverinMungal). CTA Chest 10/28/14      Assessment and Plan:  Pulmonary embolus with infarction - Bilateral Bilateral Pulmonary Embolus - now resolving well, mild residual scarring noted in the right  lower lobe, from current CT  Major Risk factor - sedentary lifestyle, former smoker Unprovoked PE  Plan: - cont with Xarelto for a total of 6 months since this is first PE/DVT - plan to stop 02/22/2015 - ECHO with elevated RVSP, cardiology will repeat ECHO - lupus anticoagulant elevate - this can be seen in an acute inflammatory phase, will repeat study prior to follow up visit. If LAC is still high at that time, will consider Heme\Onc referral and possibility of CT abd\pelvis (especially if PE is still present, then malignancy is high on the differential for DVT\PE). - encourage mobility and exercise.      DVT of lower extremity, bilateral First DVT Same plan as PE - see plan for PE       Updated Medication List Outpatient Encounter Prescriptions as of 11/18/2014  Medication Sig  . atorvastatin (LIPITOR) 40 MG tablet Take 1 tablet (40 mg total) by mouth daily at 6 PM.  . cholecalciferol (VITAMIN D) 1000 UNITS tablet Take 1,000 Units by mouth daily.  Marland Kitchen. losartan (COZAAR) 50 MG tablet Take 1 tablet (50 mg total) by mouth daily.  . Melatonin 5 MG TABS Take 15 mg by mouth at bedtime.  . metoprolol tartrate (LOPRESSOR) 25 MG tablet Take 0.5 tablets (12.5 mg total) by mouth 2 (two) times daily.  Marland Kitchen. omeprazole (PRILOSEC) 40 MG capsule Take 40 mg by mouth daily.  . rivaroxaban (XARELTO) 20 MG TABS tablet Take 1 tablet (20 mg total) by mouth daily with supper. To use after 08/27/2014 (after finish 1st month free xarelto)  . vitamin B-12 (CYANOCOBALAMIN) 500 MCG tablet Take 500 mcg by mouth daily.  . vitamin E 1000 UNIT capsule Take 1,000 Units by mouth daily.    Orders for this visit: Orders Placed This Encounter  Procedures  . PTT    Repeat anticoag lupus    Standing Status: Future     Number of Occurrences:      Standing Expiration Date: 11/18/2015    Thank  you for the visitation and for allowing  [facility] to assist in the care of your patient. Our  recommendations are noted above.  Please contact us if we can be of further service.  Stephanie AcreVishal Cady Hafen, MD [facility] Office Number: [phone]

## 2014-12-10 ENCOUNTER — Other Ambulatory Visit: Payer: Self-pay | Admitting: *Deleted

## 2014-12-10 ENCOUNTER — Encounter: Payer: Self-pay | Admitting: Cardiology

## 2014-12-10 ENCOUNTER — Ambulatory Visit (INDEPENDENT_AMBULATORY_CARE_PROVIDER_SITE_OTHER): Payer: Medicare PPO | Admitting: Cardiology

## 2014-12-10 VITALS — BP 156/84 | HR 57 | Ht 75.0 in | Wt 268.0 lb

## 2014-12-10 DIAGNOSIS — I10 Essential (primary) hypertension: Secondary | ICD-10-CM | POA: Diagnosis not present

## 2014-12-10 DIAGNOSIS — I2699 Other pulmonary embolism without acute cor pulmonale: Secondary | ICD-10-CM

## 2014-12-10 MED ORDER — METOPROLOL SUCCINATE ER 25 MG PO TB24: 25.0000 mg | ORAL_TABLET | Freq: Every day | ORAL | Status: DC

## 2014-12-10 MED ORDER — RIVAROXABAN 20 MG PO TABS: 20.0000 mg | ORAL_TABLET | Freq: Every day | ORAL | Status: DC

## 2014-12-10 NOTE — Patient Instructions (Signed)
Your physician recommends that you schedule a follow-up appointment in: one year with Dr. Hochrein  We have ordered an echo for you to get done   

## 2014-12-10 NOTE — Progress Notes (Signed)
HPI  The patient presents for follow-up of pulmonary embolism. He recently had dyspnea with an elevated troponin. He underwent cardiac catheterization and was found to have some nonobstructive disease.  Stress perfusion study was unremarkable. He did have an echo with some very mildly elevated pulmonary pressures. He subsequently was referred for a CT which demonstrated bilateral pulmonary emboli.  He also have DVT in both of his legs.  He has had a hypercoagulable workup.  He did have a elevated lupus anticoagulant.  Since I last saw him he's had no new symptoms. He continues to have some lower extremity swelling. However, he's not wear compression stockings area is not doing any ambulating. He's eating too much salt. He's not having any new shortness of breath, PND or orthopnea. He's not having any new palpitations, presyncope or syncope. He denies any chest pressure, neck or arm discomfort.    No Known Allergies  Current Outpatient Prescriptions  Medication Sig Dispense Refill  . atorvastatin (LIPITOR) 40 MG tablet Take 1 tablet (40 mg total) by mouth daily at 6 PM. 30 tablet 5  . cholecalciferol (VITAMIN D) 1000 UNITS tablet Take 1,000 Units by mouth daily.    Marland Kitchen. losartan (COZAAR) 50 MG tablet Take 1 tablet (50 mg total) by mouth daily. 30 tablet 5  . Melatonin 5 MG TABS Take 15 mg by mouth at bedtime.    . metoprolol tartrate (LOPRESSOR) 25 MG tablet Take 0.5 tablets (12.5 mg total) by mouth 2 (two) times daily. 60 tablet 5  . omeprazole (PRILOSEC) 40 MG capsule Take 40 mg by mouth daily.  0  . rivaroxaban (XARELTO) 20 MG TABS tablet Take 1 tablet (20 mg total) by mouth daily with supper. To use after 08/27/2014 (after finish 1st month free xarelto) 90 tablet 1  . vitamin B-12 (CYANOCOBALAMIN) 500 MCG tablet Take 500 mcg by mouth daily.    . vitamin E 1000 UNIT capsule Take 1,000 Units by mouth daily.     No current facility-administered medications for this visit.    Past Medical  History  Diagnosis Date  . Essential hypertension   . Anxiety   . Hyperlipidemia with target LDL less than 70   . Pulmonary emboli   . 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Past Surgical History  Procedure Laterality Date  . Splenectomy, total      TTP  . Left heart catheterization with coronary angiogram Bilateral 07/27/2014    Procedure: LEFT HEART CATHETERIZATION WITH CORONARY ANGIOGRAM;  Surgeon: Runell GessJonathan J Berry, MD;  [facility];  Service: Cardiovascular;  Laterality: Bilateral;    ROS:  As stated in the HPI and negative for all other systems.   PHYSICAL EXAM BP 156/84 mmHg  Pulse 57  Ht 6\' 3"  (1.905 m)  Wt 268 lb (121.564 kg)  BMI 33.50 kg/m2 GENERAL:  Well appearing NECK:  No jugular venous distention, waveform within normal limits, positive HJR, carotid upstroke brisk and symmetric, no bruits, no thyromegaly LYMPHATICS:  No cervical, inguinal adenopathy LUNGS:  Clear to auscultation bilaterally CHEST:  Unremarkable HEART:  PMI not displaced or sustained,S1 and S2 within normal limits, no S3, no S4, no clicks, no rubs, no murmurs ABD:  Flat, positive bowel sounds normal in frequency in pitch, no bruits, no rebound, no guarding, no midline pulsatile mass, no hepatomegaly, no splenomegaly EXT:  2 plus pulses throughout, mild bilateral leg edema, no cyanosis no clubbing   EKG:  Sinus rhythm, rate 57, axis rightward, intervals within  normal limits, nonspecific diffuse T wave flattening. Compared with previous T-wave inversion has resolved.. 12/10/2014   ASSESSMENT AND PLAN   PE:  He's having this followed by pulmonary and will be stopping his anticoagulation in August with follow-up hypercoagulable evaluation. 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  EDEMA:  We talked about conservative therapy.  He is using too much salt. We talked at length about this. He will keep his feet elevated and by compression stockings. We will  evaluate with an echo as above.  HTN:  The blood pressure is at target. No change in medications is indicated. We will continue with therapeutic lifestyle changes (TLC).  Of note I will consolidate his Toprol to XL because he is having trouble cutting the pills.

## 2015-01-15 ENCOUNTER — Ambulatory Visit (HOSPITAL_COMMUNITY)
Admission: RE | Admit: 2015-01-15 | Discharge: 2015-01-15 | Disposition: A | Discharge status: Home / Self Care | Payer: Medicare PPO | Source: Ambulatory Visit | Attending: Cardiovascular Disease | Admitting: Cardiovascular Disease

## 2015-01-15 DIAGNOSIS — I517 Cardiomegaly: Secondary | ICD-10-CM | POA: Diagnosis not present

## 2015-01-15 DIAGNOSIS — I2699 Other pulmonary embolism without acute cor pulmonale: Secondary | ICD-10-CM

## 2015-01-15 DIAGNOSIS — I10 Essential (primary) hypertension: Secondary | ICD-10-CM

## 2015-01-18 ENCOUNTER — Other Ambulatory Visit: Payer: Self-pay | Admitting: Cardiology

## 2015-02-12 ENCOUNTER — Other Ambulatory Visit
Admission: RE | Admit: 2015-02-12 | Discharge: 2015-02-12 | Disposition: A | Discharge status: Home / Self Care | Payer: Medicare PPO | Source: Ambulatory Visit | Attending: Internal Medicine | Admitting: Internal Medicine

## 2015-02-12 DIAGNOSIS — I2699 Other pulmonary embolism without acute cor pulmonale: Secondary | ICD-10-CM | POA: Diagnosis present

## 2015-02-12 LAB — APTT: aPTT: 34 seconds (ref 24–36)

## 2015-02-13 LAB — LUPUS ANTICOAGULANT PANEL
DRVVT: 52 s (ref 0.0–55.1)
PTT LA: 40.9 s (ref 0.0–50.0)

## 2015-03-01 ENCOUNTER — Encounter: Payer: Self-pay | Admitting: Internal Medicine

## 2015-03-01 ENCOUNTER — Ambulatory Visit (INDEPENDENT_AMBULATORY_CARE_PROVIDER_SITE_OTHER): Payer: Medicare PPO | Admitting: Internal Medicine

## 2015-03-01 VITALS — BP 140/82 | HR 67 | Temp 98.1°F | Ht 76.0 in | Wt 262.0 lb

## 2015-03-01 DIAGNOSIS — I2699 Other pulmonary embolism without acute cor pulmonale: Secondary | ICD-10-CM | POA: Diagnosis not present

## 2015-03-01 DIAGNOSIS — I82403 Acute embolism and thrombosis of unspecified deep veins of lower extremity, bilateral: Secondary | ICD-10-CM

## 2015-03-01 NOTE — Progress Notes (Signed)
MRN# [id number] Craig [name] 12-11-1940   CC: Chief Complaint  Patient presents with  . Follow-up    F/u PE; lab results; feels well today;      Brief History: 08/20/14 HPI Patient presents today for a hospital follow up visit after being diagnosis of bilateral pulmonary embolus, and bilateral lower extremity DVTs. He was recently hospitalized for NSTEMI and found to have DVT\PE, started on Xarelto and asked to follow up with Pulmonary. No cough, no hemoptysis, mild leg swelling. She states that he lives a fairly sedentary lifestyle, no recent surgeries, no family history of tumor, malignancy, clotting disorders. Patient states that when he was younger he smoked for about 20 years, 1 pack per day, and has quit over 30 years ago. Review of his current medication list shows no medications that would make him prone to clotting. 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 Plan - anticoagulation for 6 months, repeat echo by cards, repeat lupus AC at follow up visit   ROV 10/2014 No significant events since his last visit.  No shortness of breath, no worsening dyspnea on exertion. Still with relative sedentary lifestyle.    Events since last clinic visit: No significant events since his last visit. Now with normal labs for PE workup. Still with relatively sedentary lifestyle, but states he'll start exercising more often after this visit.     Medication:   Current Outpatient Rx  Name  Route  Sig  Dispense  Refill  . atorvastatin (LIPITOR) 40 MG tablet      take 1 tablet by mouth once daily AT 6PM   30 tablet   5   . cholecalciferol (VITAMIN D) 1000 UNITS tablet   Oral   Take 1,000 Units by mouth daily.         Marland Kitchen losartan (COZAAR) 50 MG tablet      take 1 tablet by mouth once daily   30 tablet   5   . Melatonin 5 MG TABS   Oral   Take 20 mg by mouth at bedtime.          . metoprolol  succinate (TOPROL-XL) 25 MG 24 hr tablet   Oral   Take 1 tablet (25 mg total) by mouth daily.   90 tablet   3   . omeprazole (PRILOSEC) 40 MG capsule   Oral   Take 40 mg by mouth daily.      0   . rivaroxaban (XARELTO) 20 MG TABS tablet   Oral   Take 1 tablet (20 mg total) by mouth daily with supper. To use after 08/27/2014 (after finish 1st month free xarelto)   90 tablet   3   . vitamin B-12 (CYANOCOBALAMIN) 500 MCG tablet   Oral   Take 500 mcg by mouth daily.         . vitamin E 1000 UNIT capsule   Oral   Take 1,000 Units by mouth daily.            Review of Systems: Gen:  Denies  fever, sweats, chills HEENT: Denies blurred vision, double vision, ear pain, eye pain, hearing loss, nose bleeds, sore throat Cvc:  No dizziness, chest pain or heaviness Resp:   No shortness of breath, fever, weight loss Gi: Denies swallowing difficulty, stomach pain, nausea or vomiting, diarrhea, constipation, bowel incontinence Gu:  Denies bladder incontinence, burning urine Ext:   No  Joint pain, stiffness or swelling Skin: No skin rash, easy bruising or bleeding or hives Endoc:  No polyuria, polydipsia , polyphagia or weight change Other:  All other systems negative  Allergies:  Review of patient's allergies indicates no known allergies.  Physical Examination:  VS: BP 140/82 mmHg  Pulse 67  Temp(Src) 98.1 F (36.7 C) (Oral)  Ht 6\' 4"  (1.93 m)  Wt 262 lb (118.842 kg)  BMI 31.90 kg/m2  SpO2 96%  General Appearance: No distress  HEENT: PERRLA, no ptosis, no other lesions noticed Pulmonary:normal breath sounds., diaphragmatic excursion normal.No wheezing, No rales   Cardiovascular:  Normal S1,S2.  No m/r/g.     Abdomen:Exam: Benign, Soft, non-tender, No masses  Skin:   warm, no rashes, no ecchymosis  Extremities: normal, no cyanosis, clubbing, warm with normal capillary refill.      lab results: (The following lab results were reviewed by Dr. Dema Severin). Results for Craig, [name] (MRN [id number]) as of 03/01/2015 11:35  Ref. Range 02/12/2015 14:31  PTT Lupus Anticoagulant Latest Ref Range: 0.0-50.0 sec 40.9  DRVVT Latest Ref Range: 0.0-55.1 sec 52.0    ECHO 12/2014 Study Conclusions  - Left ventricle: The cavity size was normal. Wall thickness was normal. Systolic function was normal. The estimated ejection fraction was in the range of 55% to 60%. Wall motion was normal; there were no regional wall motion abnormalities. Features are consistent with a pseudonormal left ventricular filling pattern, with concomitant abnormal relaxation and increased filling pressure (grade 2 diastolic dysfunction). - Left atrium: The atrium was moderately dilated. - Right atrium: The atrium was mildly dilated.   Assessment and Plan: 74 year old male follow-up for pulmonary embolus/dvt Pulmonary embolus with infarction - Bilateral Bilateral Pulmonary Embolus  Major Risk factor - sedentary lifestyle, former smoker Unprovoked PE  Plan: - Completed 6 months of anticoagulation therapy with Xarelto, will stop now - lupus anticoagulant now normal limits - no further anticoagulation needed.     DVT of lower extremity, bilateral First DVT Same plan as PE - see plan for PE        Updated Medication List Outpatient Encounter Prescriptions as of 03/01/2015  Medication Sig  . atorvastatin (LIPITOR) 40 MG tablet take 1 tablet by mouth once daily AT 6PM  . cholecalciferol (VITAMIN D) 1000 UNITS tablet Take 1,000 Units by mouth daily.  Marland Kitchen losartan (COZAAR) 50 MG tablet take 1 tablet by mouth once daily  . Melatonin 5 MG TABS Take 20 mg by mouth at bedtime.   . metoprolol succinate (TOPROL-XL) 25 MG 24 hr tablet Take 1 tablet (25 mg total) by mouth daily.  Marland Kitchen omeprazole (PRILOSEC) 40 MG capsule Take 40 mg by mouth daily.  . rivaroxaban (XARELTO) 20 MG TABS tablet Take 1 tablet (20 mg total) by mouth daily with supper. To use after 08/27/2014 (after finish 1st month  free xarelto)  . vitamin B-12 (CYANOCOBALAMIN) 500 MCG tablet Take 500 mcg by mouth daily.  . vitamin E 1000 UNIT capsule Take 1,000 Units by mouth daily.   No facility-administered encounter medications on file as of 03/01/2015.    Orders for this visit: No orders of the defined types were placed in this encounter.    Thank  you for the visitation and for allowing  [facility] to assist in the care of your patient. Our recommendations are noted above.  Please contact us if we can be of further service.  Stephanie Acre, MD [facility] Office  Number: [phone]

## 2015-03-01 NOTE — Patient Instructions (Signed)
Follow up with Dr. Dema Severin as needed - stop Xarelto - increase level of exercise - avoid sedentary lifestyle - cont with diet, exercise, weight loss.

## 2015-03-01 NOTE — Assessment & Plan Note (Signed)
First DVT Same plan as PE - see plan for PE 

## 2015-03-01 NOTE — Assessment & Plan Note (Signed)
Bilateral Pulmonary Embolus  Major Risk factor - sedentary lifestyle, former smoker Unprovoked PE  Plan: - Completed 6 months of anticoagulation therapy with Xarelto, will stop now - lupus anticoagulant now normal limits - no further anticoagulation needed.

## 2015-03-30 ENCOUNTER — Encounter: Payer: Self-pay | Admitting: Emergency Medicine

## 2015-03-30 ENCOUNTER — Ambulatory Visit: Payer: Medicare PPO

## 2015-03-30 ENCOUNTER — Ambulatory Visit
Admission: EM | Admit: 2015-03-30 | Discharge: 2015-03-30 | Disposition: A | Discharge status: Home / Self Care | Payer: Medicare PPO | Attending: Family Medicine | Admitting: Family Medicine

## 2015-03-30 DIAGNOSIS — J40 Bronchitis, not specified as acute or chronic: Secondary | ICD-10-CM | POA: Diagnosis not present

## 2015-03-30 MED ORDER — AMOXICILLIN-POT CLAVULANATE 875-125 MG PO TABS: 1.0000 | ORAL_TABLET | Freq: Two times a day (BID) | ORAL | Status: DC

## 2015-03-30 MED ORDER — BENZONATATE 200 MG PO CAPS: 200.0000 mg | ORAL_CAPSULE | Freq: Three times a day (TID) | ORAL | Status: DC | PRN

## 2015-03-30 NOTE — ED Provider Notes (Signed)
CSN: [id number]     Arrival date & time 03/30/15  4098 History   First MD Initiated Contact with Patient 03/30/15 [phone]     Chief Complaint  Patient presents with  . Cough   (Consider location/radiation/quality/duration/timing/severity/associated sxs/prior Treatment) HPI Comments: 74 yo male with a 1 week h/o productive cough, fevers, chills. Denies any nasal congestion, earache, sore throat, chest pain or shortness of breath.   The history is provided by the patient.    Past Medical History  Diagnosis Date  . Essential hypertension   . Anxiety   . Hyperlipidemia with target LDL less than 70   . Pulmonary emboli   . 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Past Surgical History  Procedure Laterality Date  . Splenectomy, total      TTP  . Left heart catheterization with coronary angiogram Bilateral 07/27/2014    Procedure: LEFT HEART CATHETERIZATION WITH CORONARY ANGIOGRAM;  Surgeon: Runell Gess, MD;  Location: [facility] CATH LAB;  Service: Cardiovascular;  Laterality: Bilateral;   Family History  Problem Relation Age of Onset  . CAD Father 61  . CAD Brother   . Kidney failure Mother    Social History  Substance Use Topics  . Smoking status: Former Smoker -- 20 years    Types: Cigarettes  . Smokeless tobacco: None     Comment: Quit tobacco in 1980  . Alcohol Use: No     Comment: quit 30 years ago    Review of Systems  Allergies  Review of patient's allergies indicates no known allergies.  Home Medications   Prior to Admission medications   Medication Sig Start Date End Date Taking? Authorizing Provider  amoxicillin-clavulanate (AUGMENTIN) 875-125 MG per tablet Take 1 tablet by mouth 2 (two) times daily. 03/30/15   Payton Mccallum, MD  atorvastatin (LIPITOR) 40 MG tablet take 1 tablet by mouth once daily AT [facility] 01/20/15   Rollene Rotunda, MD  benzonatate (TESSALON) 200 MG capsule Take 1 capsule (200 mg total) by mouth 3 (three) times daily as needed for cough. 03/30/15   Payton Mccallum, MD   cholecalciferol (VITAMIN D) 1000 UNITS tablet Take 1,000 Units by mouth daily.    Historical Provider, MD  losartan (COZAAR) 50 MG tablet take 1 tablet by mouth once daily 01/20/15   Rollene Rotunda, MD  Melatonin 5 MG TABS Take 20 mg by mouth at bedtime.     Historical Provider, MD  metoprolol succinate (TOPROL-XL) 25 MG 24 hr tablet Take 1 tablet (25 mg total) by mouth daily. 12/10/14   Rollene Rotunda, MD  omeprazole (PRILOSEC) 40 MG capsule Take 40 mg by mouth daily. 04/26/14   Historical Provider, MD  rivaroxaban (XARELTO) 20 MG TABS tablet Take 1 tablet (20 mg total) by mouth daily with supper. To use after 08/27/2014 (after finish 1st month free xarelto) 12/10/14   Rollene Rotunda, MD  vitamin B-12 (CYANOCOBALAMIN) 500 MCG tablet Take 500 mcg by mouth daily.    Historical Provider, MD  vitamin E 1000 UNIT capsule Take 1,000 Units by mouth daily.    Historical Provider, MD   Meds Ordered and Administered this Visit  Medications - No data to display  BP 145/70 mmHg  Pulse 70  Temp(Src) 97.2 F (36.2 C) (Tympanic)  Resp 16  Ht 6\' 4"  (1.93 m)  Wt 247 lb (112.038 kg)  BMI 30.08 kg/m2  SpO2 95% No data found.   Physical Exam  Constitutional: He appears well-developed and well-nourished. No distress.  HENT:  Head: Normocephalic  and atraumatic.  Right Ear: Tympanic membrane, external ear and ear canal normal.  Left Ear: Tympanic membrane, external ear and ear canal normal.  Nose: Nose normal.  Mouth/Throat: Uvula is midline, oropharynx is clear and moist and mucous membranes are normal. No oropharyngeal exudate or tonsillar abscesses.  Eyes: Conjunctivae and EOM are normal. Pupils are equal, round, and reactive to light. Right eye exhibits no discharge. Left eye exhibits no discharge. No scleral icterus.  Neck: Normal range of motion. Neck supple. No tracheal deviation present. No thyromegaly present.  Cardiovascular: Normal rate, regular rhythm and normal heart sounds.   Pulmonary/Chest:  Effort normal. No stridor. No respiratory distress. He has no wheezes. He has rales (right base). He exhibits no tenderness.  Lymphadenopathy:    He has no cervical adenopathy.  Neurological: He is alert.  Skin: Skin is warm and dry. No rash noted. He is not diaphoretic.  Nursing note and vitals reviewed.   ED Course  Procedures (including critical care time)  Labs Review Labs Reviewed - No data to display  Imaging Review Dg Chest 2 View  03/30/2015   CLINICAL DATA:  One week history of productive cough with wheezing and sweats  EXAM: CHEST  2 VIEW  COMPARISON:  Chest radiograph July 25, 2014; chest CT October 28, 2014  FINDINGS: There is no edema or consolidation. Heart size and pulmonary vascularity are normal. No adenopathy. There are surgical clips in the left upper abdomen. There is degenerative change in the thoracic spine. There is atherosclerotic calcification in the aortic arch region.  IMPRESSION: No edema or consolidation.   Electronically Signed   By: Bretta Bang III M.D.   On: 03/30/2015 10:48     Visual Acuity Review  Right Eye Distance:   Left Eye Distance:   Bilateral Distance:    Right Eye Near:   Left Eye Near:    Bilateral Near:         MDM   1. Bronchitis    Discharge Medication List as of 03/30/2015 11:10 AM    START taking these medications   Details  amoxicillin-clavulanate (AUGMENTIN) 875-125 MG per tablet Take 1 tablet by mouth 2 (two) times daily., Starting 03/30/2015, Until Discontinued, Normal    benzonatate (TESSALON) 200 MG capsule Take 1 capsule (200 mg total) by mouth 3 (three) times daily as needed for cough., Starting 03/30/2015, Until Discontinued, Normal      Plan: 1. X-ray results and diagnosis reviewed with patient 2. rx as per orders; risks, benefits, potential side effects reviewed with patient 3. Recommend supportive treatment with rest, increased fluids 4. F/u prn if symptoms worsen or don't improve    Payton Mccallum,  MD 03/30/15 [phone]

## 2015-03-30 NOTE — ED Notes (Signed)
Patient c/o cough and chest congestion for 7 days.

## 2015-04-05 ENCOUNTER — Encounter: Payer: Self-pay | Admitting: Family Medicine

## 2015-04-05 ENCOUNTER — Ambulatory Visit: Payer: Self-pay | Admitting: Family Medicine

## 2015-04-05 ENCOUNTER — Ambulatory Visit (INDEPENDENT_AMBULATORY_CARE_PROVIDER_SITE_OTHER): Payer: Medicare PPO | Admitting: Family Medicine

## 2015-04-05 VITALS — BP 130/72 | HR 85 | Temp 98.7°F | Resp 16 | Ht 76.0 in | Wt 246.0 lb

## 2015-04-05 DIAGNOSIS — E785 Hyperlipidemia, unspecified: Secondary | ICD-10-CM

## 2015-04-05 DIAGNOSIS — Z9081 Acquired absence of spleen: Secondary | ICD-10-CM

## 2015-04-05 DIAGNOSIS — I10 Essential (primary) hypertension: Secondary | ICD-10-CM

## 2015-04-05 DIAGNOSIS — N200 Calculus of kidney: Secondary | ICD-10-CM

## 2015-04-05 DIAGNOSIS — J189 Pneumonia, unspecified organism: Secondary | ICD-10-CM

## 2015-04-05 DIAGNOSIS — I82403 Acute embolism and thrombosis of unspecified deep veins of lower extremity, bilateral: Secondary | ICD-10-CM | POA: Diagnosis not present

## 2015-04-05 DIAGNOSIS — K219 Gastro-esophageal reflux disease without esophagitis: Secondary | ICD-10-CM | POA: Diagnosis not present

## 2015-04-05 DIAGNOSIS — I2699 Other pulmonary embolism without acute cor pulmonale: Secondary | ICD-10-CM

## 2015-04-05 MED ORDER — METOPROLOL SUCCINATE ER 25 MG PO TB24: 25.0000 mg | ORAL_TABLET | Freq: Every day | ORAL | Status: DC

## 2015-04-05 MED ORDER — LEVOFLOXACIN 500 MG PO TABS: 500.0000 mg | ORAL_TABLET | Freq: Every day | ORAL | Status: DC

## 2015-04-05 MED ORDER — OMEPRAZOLE 40 MG PO CPDR: 40.0000 mg | DELAYED_RELEASE_CAPSULE | Freq: Every day | ORAL | Status: DC

## 2015-04-05 MED ORDER — LOSARTAN POTASSIUM 50 MG PO TABS: 50.0000 mg | ORAL_TABLET | Freq: Every day | ORAL | Status: DC

## 2015-04-05 MED ORDER — ATORVASTATIN CALCIUM 40 MG PO TABS: 40.0000 mg | ORAL_TABLET | Freq: Every day | ORAL | Status: DC

## 2015-04-05 NOTE — Progress Notes (Signed)
Name: Craig [name]   MRN: [id number]    DOB: December 22, 1940   Date:04/05/2015       Progress Note  Subjective  Chief Complaint  Chief Complaint  Patient presents with  . Establish Care    Pt would like all rxs filled here.  . Cough    chest congestion: had cxr at [facility] urgent care and dx with pneumonia. Pt was given Augmentin/Benzonatate; patient still coughing.    HPI  Here to establish care.  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  Took Xarelto x 6 mos.  Now off.  Had Pneumonia dxd 6 days ago.  Has had cough x 3 weeks.  Has seen Dr. Antoine Poche, (Card) in [place].  Has HBP, elevated lipids,  GERD.    No problem-specific assessment & plan notes found for this encounter.   Past Medical History  Diagnosis Date  . Essential hypertension   . Anxiety   . Hyperlipidemia with target LDL less than 70   . Pulmonary emboli   . 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Social History  Substance Use Topics  . Smoking status: Former Smoker -- 20 years    Types: Cigarettes  . Smokeless tobacco: Never Used     Comment: Quit tobacco in 1980  . Alcohol Use: No     Comment: quit 30 years ago     Current outpatient prescriptions:  .  amoxicillin-clavulanate (AUGMENTIN) 875-125 MG per tablet, Take 1 tablet by mouth 2 (two) times daily., Disp: 20 tablet, Rfl: 0 .  atorvastatin (LIPITOR) 40 MG tablet, take 1 tablet by mouth once daily AT 6PM, Disp: 30 tablet, Rfl: 5 .  benzonatate (TESSALON) 200 MG capsule, Take 1 capsule (200 mg total) by mouth 3 (three) times daily as needed for cough., Disp: 30 capsule, Rfl: 0 .  cholecalciferol (VITAMIN D) 1000 UNITS tablet, Take 1,000 Units by mouth daily., Disp: , Rfl:  .  dextromethorphan-guaiFENesin (MUCINEX DM) 30-600 MG per 12 hr tablet, Take 1 tablet by mouth 2 (two) times daily., Disp: , Rfl:  .  losartan (COZAAR) 50 MG tablet, take 1 tablet by mouth once daily, Disp: 30 tablet, Rfl: 5 .  Melatonin 5 MG TABS, Take 20 mg by mouth at bedtime. , Disp: , Rfl:  .  metoprolol succinate (TOPROL-XL)  25 MG 24 hr tablet, Take 1 tablet (25 mg total) by mouth daily., Disp: 90 tablet, Rfl: 3 .  omeprazole (PRILOSEC) 40 MG capsule, Take 40 mg by mouth daily., Disp: , Rfl: 0 .  pseudoephedrine (SUDAFED) 120 MG 12 hr tablet, Take 120 mg by mouth 2 (two) times daily., Disp: , Rfl:  .  vitamin B-12 (CYANOCOBALAMIN) 500 MCG tablet, Take 500 mcg by mouth daily., Disp: , Rfl:  .  vitamin E 1000 UNIT capsule, Take 1,000 Units by mouth daily., Disp: , Rfl:   No Known Allergies  Review of Systems  Constitutional: Negative for fever, chills and malaise/fatigue.  HENT: Negative for hearing loss.   Eyes: Negative for blurred vision and double vision.  Respiratory: Negative for cough, sputum production, shortness of breath and wheezing.   Cardiovascular: Negative for chest pain, palpitations, claudication and leg swelling.  Gastrointestinal: Negative for heartburn, abdominal pain and blood in stool.  Genitourinary: Negative for dysuria, urgency and frequency.  Skin: Negative for rash.  Neurological: Negative for dizziness, sensory change, focal weakness, weakness and headaches.      Objective  Filed Vitals:   04/05/15 1427  BP: 130/72  Pulse: 85  Temp:  98.7 F (37.1 C)  TempSrc: Oral  Resp: 16  Height: 6\' 4"  (1.93 m)  Weight: 246 lb (111.585 kg)     Physical Exam  Constitutional: He is well-developed, well-nourished, and in no distress. No distress.  HENT:  Head: Normocephalic and atraumatic.  Eyes: Conjunctivae and EOM are normal. Pupils are equal, round, and reactive to light. No scleral icterus.  Neck: Neck supple. Carotid bruit is not present. No thyromegaly present.  Cardiovascular: Normal rate, regular rhythm, normal heart sounds and intact distal pulses.  Exam reveals no gallop and no friction rub.   No murmur heard. Pulmonary/Chest: Effort normal and breath sounds normal. No respiratory distress. He has no wheezes. He has no rales.  Abdominal: Soft. Bowel sounds are normal. He  exhibits no distension and no mass. There is no tenderness.  Musculoskeletal: Normal range of motion. He exhibits edema (1+ edema of bilat. ankles.).  Lymphadenopathy:    He has no cervical adenopathy.  Vitals reviewed.     Recent Results (from the past 2160 hour(s))  APTT     Status: None   Collection Time: 02/12/15  2:31 PM  Result Value Ref Range   aPTT 34 24 - 36 seconds  Lupus anticoagulant panel     Status: None   Collection Time: 02/12/15  2:31 PM  Result Value Ref Range   PTT Lupus Anticoagulant 40.9 0.0 - 50.0 sec   DRVVT 52.0 0.0 - 55.1 sec   Lupus Anticoag Interp Comment:     Comment: (NOTE) No lupus anticoagulant was detected. Performed At: [facility] [address] [place], [place] [id number] Mila Homer MD WU:[id number]      Assessment & Plan   1. Pneumonia due to organism  - levofloxacin (LEVAQUIN) 500 MG tablet; Take 1 tablet (500 mg total) by mouth daily.  Dispense: 7 tablet; Refill: 0  2. Essential hypertension  - losartan (COZAAR) 50 MG tablet; Take 1 tablet (50 mg total) by mouth daily.  Dispense: 90 tablet; Refill: 3 - metoprolol succinate (TOPROL-XL) 25 MG 24 hr tablet; Take 1 tablet (25 mg total) by mouth daily.  Dispense: 90 tablet; Refill: 3  3. Pulmonary embolus with infarction - Bilateral   4. Hyperlipidemia with target LDL less than 70  - atorvastatin (LIPITOR) 40 MG tablet; Take 1 tablet (40 mg total) by mouth daily at 6 PM.  Dispense: 90 tablet; Refill: 3  5. DVT of lower extremity, bilateral   6. Kidney stones  - Ambulatory referral to Urology  7. Gastroesophageal reflux disease without esophagitis  - omeprazole (PRILOSEC) 40 MG capsule; Take 1 capsule (40 mg total) by mouth daily.  Dispense: 90 capsule; Refill: 3

## 2015-04-05 NOTE — Patient Instructions (Signed)
Continue to take current meds.

## 2015-04-15 ENCOUNTER — Encounter: Payer: Self-pay | Admitting: Obstetrics and Gynecology

## 2015-04-15 ENCOUNTER — Ambulatory Visit (INDEPENDENT_AMBULATORY_CARE_PROVIDER_SITE_OTHER): Payer: Medicare PPO | Admitting: Obstetrics and Gynecology

## 2015-04-15 VITALS — BP 155/119 | HR 68 | Temp 97.4°F | Resp 16 | Ht 76.0 in | Wt 251.7 lb

## 2015-04-15 DIAGNOSIS — E785 Hyperlipidemia, unspecified: Secondary | ICD-10-CM | POA: Insufficient documentation

## 2015-04-15 DIAGNOSIS — Z125 Encounter for screening for malignant neoplasm of prostate: Secondary | ICD-10-CM

## 2015-04-15 DIAGNOSIS — N200 Calculus of kidney: Secondary | ICD-10-CM

## 2015-04-15 DIAGNOSIS — G939 Disorder of brain, unspecified: Secondary | ICD-10-CM | POA: Insufficient documentation

## 2015-04-15 DIAGNOSIS — R350 Frequency of micturition: Secondary | ICD-10-CM | POA: Diagnosis not present

## 2015-04-15 DIAGNOSIS — I6782 Cerebral ischemia: Secondary | ICD-10-CM | POA: Insufficient documentation

## 2015-04-15 DIAGNOSIS — K2270 Barrett's esophagus without dysplasia: Secondary | ICD-10-CM | POA: Insufficient documentation

## 2015-04-15 LAB — URINALYSIS, COMPLETE
Bilirubin, UA: NEGATIVE
Glucose, UA: NEGATIVE
Ketones, UA: NEGATIVE
LEUKOCYTES UA: NEGATIVE
NITRITE UA: NEGATIVE
Protein, UA: NEGATIVE
Specific Gravity, UA: 1.025 (ref 1.005–1.030)
Urobilinogen, Ur: 0.2 mg/dL (ref 0.2–1.0)
pH, UA: 5.5 (ref 5.0–7.5)

## 2015-04-15 LAB — MICROSCOPIC EXAMINATION: RBC MICROSCOPIC, UA: NONE SEEN /HPF (ref 0–?)

## 2015-04-15 MED ORDER — TAMSULOSIN HCL 0.4 MG PO CAPS: 0.4000 mg | ORAL_CAPSULE | Freq: Every day | ORAL | Status: DC

## 2015-04-15 NOTE — Progress Notes (Signed)
04/15/2015 1:14 PM   Craig [name] 05-07-1941 [id number]  Referring provider: Janeann Forehand., MD [address] [place], [place] [address]  Chief Complaint  Patient presents with  . Nephrolithiasis  . Establish Care    HPI: Patient is a 74 year old male with a history of kidney stones as it is today as a referral from his primary care provider Dr. Juanetta Gosling for evaluation of possible recurrence of stones. Patient states he was recently treated for pneumonia and just finished a course of Levaquin yesterday. He reports that he was having significant respiratory irritation and was coughing a lot. He states that he believes that he may have "knocked a stone loose". Current symptoms include urinary frequency, bladder spasms, bladder pressure and intermittency. He states that he does feel like he completely empties his bladder. No dysuria or gross hematuria.  Patient reports a significant history for history of stones stating he thinks he has passed at least 30-40 stones in his lifetime. He reports that he did require ESWL approximately 20 years ago for a large stone. A stent was also placed at that time.  PMH: Past Medical History  Diagnosis Date  . Essential hypertension   . Anxiety   . Hyperlipidemia with target LDL less than 70   . Pulmonary emboli   . 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Surgical History: Past Surgical History  Procedure Laterality Date  . Splenectomy, total      TTP  . Left heart catheterization with coronary angiogram Bilateral 07/27/2014    Procedure: LEFT HEART CATHETERIZATION WITH CORONARY ANGIOGRAM;  Surgeon: Runell Gess, MD;  Location: [facility] CATH LAB;  Service: Cardiovascular;  Laterality: Bilateral;    Home Medications:    Medication List       This list is accurate as of: 04/15/15  1:14 PM.  Always use your most recent med list.               atorvastatin 40 MG tablet  Commonly known as:  LIPITOR  Take 1 tablet (40 mg total) by mouth daily at 6 PM.     benzonatate  200 MG capsule  Commonly known as:  TESSALON  Take 1 capsule (200 mg total) by mouth 3 (three) times daily as needed for cough.     cholecalciferol 1000 UNITS tablet  Commonly known as:  VITAMIN D  Take 1,000 Units by mouth daily.     dextromethorphan-guaiFENesin 30-600 MG per 12 hr tablet  Commonly known as:  MUCINEX DM  Take 1 tablet by mouth 2 (two) times daily.     levofloxacin 500 MG tablet  Commonly known as:  LEVAQUIN  Take 1 tablet (500 mg total) by mouth daily.     losartan 50 MG tablet  Commonly known as:  COZAAR  Take 1 tablet (50 mg total) by mouth daily.     Melatonin 5 MG Tabs  Take 20 mg by mouth at bedtime.     metoprolol succinate 25 MG 24 hr tablet  Commonly known as:  TOPROL-XL  Take 1 tablet (25 mg total) by mouth daily.     omeprazole 40 MG capsule  Commonly known as:  PRILOSEC  Take 1 capsule (40 mg total) by mouth daily.     pseudoephedrine 120 MG 12 hr tablet  Commonly known as:  SUDAFED  Take 120 mg by mouth 2 (two) times daily.     tamsulosin 0.4 MG Caps capsule  Commonly known as:  FLOMAX  Take 1  capsule (0.4 mg total) by mouth daily.     vitamin B-12 500 MCG tablet  Commonly known as:  CYANOCOBALAMIN  Take 500 mcg by mouth daily.     vitamin E 1000 UNIT capsule  Take 1,000 Units by mouth daily.        Allergies: No Known Allergies  Family History: Family History  Problem Relation Age of Onset  . CAD Father 54  . CAD Brother   . Kidney failure Mother     Social History:  reports that he has quit smoking. His smoking use included Cigarettes. He quit after 20 years of use. He has never used smokeless tobacco. He reports that he uses illicit drugs. He reports that he does not drink alcohol.  ROS: UROLOGY Frequent Urination?: Yes Hard to postpone urination?: No Burning/pain with urination?: No Get up at night to urinate?: Yes Leakage of urine?: No Urine stream starts and stops?: Yes Trouble starting stream?: No Do you have  to strain to urinate?: No Blood in urine?: No Urinary tract infection?: No Sexually transmitted disease?: No Injury to kidneys or bladder?: No Painful intercourse?: No Weak stream?: No Erection problems?: No Penile pain?: No  Gastrointestinal Nausea?: No Vomiting?: No Indigestion/heartburn?: No Diarrhea?: No Constipation?: No  Constitutional Fever: No Night sweats?: No Weight loss?: Yes Fatigue?: No  Skin Skin rash/lesions?: No Itching?: No  Eyes Blurred vision?: Yes Double vision?: No  Ears/Nose/Throat Sore throat?: No Sinus problems?: No  Hematologic/Lymphatic Swollen glands?: No Easy bruising?: No  Cardiovascular Leg swelling?: No Chest pain?: No  Respiratory Cough?: Yes Shortness of breath?: No  Endocrine Excessive thirst?: No  Musculoskeletal Back pain?: Yes Joint pain?: No  Neurological Headaches?: No Dizziness?: No  Psychologic Depression?: No Anxiety?: No  Physical Exam: BP 155/119 mmHg  Pulse 68  Temp(Src) 97.4 F (36.3 C)  Resp 16  Ht  (1.93 m)  Wt 251 lb 11.2 oz (114.17 kg)  BMI 30.65 kg/m2  Constitutional:  Alert and oriented, No acute distress. HEENT: [place] AT, moist mucus membranes.  Trachea midline, no masses. Cardiovascular: No clubbing, cyanosis, or edema. Respiratory: Normal respiratory effort, no increased work of breathing. GI: Abdomen is soft, nontender, nondistended, no abdominal masses GU: No CVA tenderness. Normal circumcised phallus, testicles distended bilaterally without palpable masses or tenderness DRE: Limited due to patient's body habitus, no palpable nodules, possibly slightly enlarged prostate Skin: No rashes, bruises or suspicious lesions. Lymph: No cervical or inguinal adenopathy. Neurologic: Grossly intact, no focal deficits, moving all 4 extremities. Psychiatric: Normal mood and affect.  Laboratory Data:   Urinalysis No results found for: COLORURINE, APPEARANCEUR, LABSPEC, PHURINE, GLUCOSEU,  HGBUR, BILIRUBINUR, KETONESUR, PROTEINUR, UROBILINOGEN, NITRITE, LEUKOCYTESUR  Pertinent Imaging:   Assessment & Plan:    1. Nephrolithiasis-  Long history of kidney stones requiring ESWL once. CT A/P without contrast ordered for further evaluation of dental stone burden. - Urinalysis, Complete  2.  Urinary Frequency- UA positive for 6-10 WBCs, crystals and amorphous sediment present and many bacteria. Urine sent for culture. CT ordered to check for stones and we will start Flomax today and see if this improves any of his urinary symptoms.  3. Bladder Spasms- see above   4. Prostate Cancer screening- DRE limited due to patient's body habitus. Prostate slightly enlarged and palpable area nonnodular. PSA drawn today.   Return for f/u for CT results.  These notes generated with voice recognition software. I apologize for typographical errors.  Earlie Lou, FNP  [facility] [address]  [address] [place], [place] [address] [phone]

## 2015-04-16 ENCOUNTER — Ambulatory Visit
Admission: RE | Admit: 2015-04-16 | Discharge: 2015-04-16 | Disposition: A | Discharge status: Home / Self Care | Payer: Medicare PPO | Source: Ambulatory Visit | Attending: Obstetrics and Gynecology | Admitting: Obstetrics and Gynecology

## 2015-04-16 ENCOUNTER — Ambulatory Visit: Admission: RE | Admit: 2015-04-16 | Payer: Medicare PPO | Source: Ambulatory Visit | Admitting: *Deleted

## 2015-04-16 ENCOUNTER — Telehealth: Payer: Self-pay

## 2015-04-16 DIAGNOSIS — N200 Calculus of kidney: Secondary | ICD-10-CM

## 2015-04-16 LAB — COMPREHENSIVE METABOLIC PANEL
A/G RATIO: 1.3 (ref 1.1–2.5)
ALK PHOS: 95 IU/L (ref 39–117)
ALT: 14 IU/L (ref 0–44)
AST: 27 IU/L (ref 0–40)
Albumin: 4 g/dL (ref 3.5–4.8)
BUN/Creatinine Ratio: 14 (ref 10–22)
BUN: 14 mg/dL (ref 8–27)
Bilirubin Total: 1.6 mg/dL — ABNORMAL HIGH (ref 0.0–1.2)
CO2: 26 mmol/L (ref 18–29)
Calcium: 9.6 mg/dL (ref 8.6–10.2)
Chloride: 103 mmol/L (ref 97–108)
Creatinine, Ser: 0.97 mg/dL (ref 0.76–1.27)
GFR calc Af Amer: 89 mL/min/{1.73_m2} (ref >59)
GFR calc non Af Amer: 77 mL/min/{1.73_m2} (ref >59)
Globulin, Total: 3.1 g/dL (ref 1.5–4.5)
Glucose: 106 mg/dL — ABNORMAL HIGH (ref 65–99)
POTASSIUM: 4.4 mmol/L (ref 3.5–5.2)
SODIUM: 144 mmol/L (ref 134–144)
Total Protein: 7.1 g/dL (ref 6.0–8.5)

## 2015-04-16 LAB — PSA: PROSTATE SPECIFIC AG, SERUM: 1.6 ng/mL (ref 0.0–4.0)

## 2015-04-16 MED ORDER — HYDROCODONE-ACETAMINOPHEN 5-300 MG PO TABS: ORAL_TABLET | ORAL | Status: DC

## 2015-04-16 NOTE — Telephone Encounter (Signed)
-----   Message from Fernanda Drum, FNP sent at 04/16/2015  7:51 AM EDT ----- Please notify patient and his blood work was all within normal limits. His PSA was 1.6 which is good. He needs follow-up is scheduled. Thanks

## 2015-04-16 NOTE — Telephone Encounter (Signed)
LMOM

## 2015-04-16 NOTE — Telephone Encounter (Signed)
-----   Message from Lindsay C Overton, FNP sent at 04/16/2015  7:51 AM EDT ----- Please notify patient and his blood work was all within normal limits. His PSA was 1.6 which is good. He needs follow-up is scheduled. Thanks 

## 2015-04-16 NOTE — Telephone Encounter (Signed)
Pt returned the phone call. Made pt aware of lab results. Pt c/o severe pain wanting to some pain medication and an xray since the CT was taking to long. Per Lillia Abed pt was given vicodin and a KUB order. Pt voiced understanding.

## 2015-04-17 LAB — CULTURE, URINE COMPREHENSIVE

## 2015-04-19 ENCOUNTER — Telehealth: Payer: Self-pay

## 2015-04-19 DIAGNOSIS — N200 Calculus of kidney: Secondary | ICD-10-CM

## 2015-04-19 MED ORDER — TAMSULOSIN HCL 0.4 MG PO CAPS: 0.4000 mg | ORAL_CAPSULE | Freq: Every day | ORAL | Status: DC

## 2015-04-19 NOTE — Telephone Encounter (Signed)
His KUB showed a right renal calculus and a questionable left ureteral calculus. He should not be having any pain from his right renal stone. He still needs his CT scan scan to determine whether or not the calcification seen is actually a stone in his ureter. He to follow-up for his CT results after it is done.  thanks

## 2015-04-19 NOTE — Telephone Encounter (Signed)
Spoke with pt in reference to KUB and CT. Pt stated he did not want to have CT. Pt stated that he knows he has kidney stones and he knows that he has to pass them, therefore he is not going to have the CT. Pt also c/o that he came in for an office visit last week due to pain and was not given any pain medication. Nurse reinforced with pt our recommendations are for a CT with a f/u appt but it is his choice if he has the CT or not. Pt voiced understanding.

## 2015-04-19 NOTE — Telephone Encounter (Signed)
Spoke with pt who requested results of KUB. Results are in the system. Please advise.

## 2015-04-21 NOTE — Telephone Encounter (Signed)
Patient did receive a prescription for Vicodin last week. See documentation of previous telephone conversations. The KUB is indeterminate. We will not provide him with any further narcotic pain medication until he has a CT scan and we can determine whether or not he does have an obstructing stone.

## 2015-04-27 ENCOUNTER — Ambulatory Visit
Admission: RE | Admit: 2015-04-27 | Discharge: 2015-04-27 | Disposition: A | Discharge status: Home / Self Care | Payer: Medicare PPO | Source: Ambulatory Visit | Attending: Family Medicine | Admitting: Family Medicine

## 2015-04-27 ENCOUNTER — Encounter: Payer: Self-pay | Admitting: Family Medicine

## 2015-04-27 ENCOUNTER — Ambulatory Visit (INDEPENDENT_AMBULATORY_CARE_PROVIDER_SITE_OTHER): Payer: Medicare PPO | Admitting: Family Medicine

## 2015-04-27 VITALS — BP 150/82 | HR 82 | Temp 98.4°F | Resp 16 | Ht 76.0 in | Wt 260.8 lb

## 2015-04-27 DIAGNOSIS — I10 Essential (primary) hypertension: Secondary | ICD-10-CM

## 2015-04-27 DIAGNOSIS — Z23 Encounter for immunization: Secondary | ICD-10-CM | POA: Diagnosis not present

## 2015-04-27 DIAGNOSIS — J189 Pneumonia, unspecified organism: Secondary | ICD-10-CM | POA: Insufficient documentation

## 2015-04-27 DIAGNOSIS — J181 Lobar pneumonia, unspecified organism: Principal | ICD-10-CM

## 2015-04-27 DIAGNOSIS — G4700 Insomnia, unspecified: Secondary | ICD-10-CM

## 2015-04-27 DIAGNOSIS — E785 Hyperlipidemia, unspecified: Secondary | ICD-10-CM

## 2015-04-27 MED ORDER — SERTRALINE HCL 50 MG PO TABS: 50.0000 mg | ORAL_TABLET | Freq: Every day | ORAL | Status: DC

## 2015-04-27 MED ORDER — LOSARTAN POTASSIUM 100 MG PO TABS
ORAL_TABLET | ORAL | Status: DC
Start: 2015-04-27 — End: 2016-03-26

## 2015-04-27 NOTE — Patient Instructions (Signed)
Take Sertraline, 50 mg., 1/2 tablet  Daily for 6 days, then increase to 1 a day.

## 2015-04-27 NOTE — Progress Notes (Signed)
Name: Craig [name]   MRN: [id number]    DOB: 1941-01-21   Date:04/27/2015       Progress Note  Subjective  Chief Complaint  Chief Complaint  Patient presents with  . Hypertension    passed kidney stone last week. Follows [facility]    HPI  Here for f/u of pneumonia.  Has HBP .  C/o lifelong insomnia.  Takes 30 mg. Of Melatonin nightly. No problem-specific assessment & plan notes found for this encounter.   Past Medical History  Diagnosis Date  . Essential hypertension   . Anxiety   . Hyperlipidemia with target LDL less than 70   . Pulmonary emboli (HCC)   . 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[facility])     Social History  Substance Use Topics  . Smoking status: Former Smoker -- 20 years    Types: Cigarettes  . Smokeless tobacco: Never Used     Comment: Quit tobacco in 1980  . Alcohol Use: No     Comment: quit 30 years ago     Current outpatient prescriptions:  .  atorvastatin (LIPITOR) 40 MG tablet, Take 1 tablet (40 mg total) by mouth daily at 6 PM., Disp: 90 tablet, Rfl: 3 .  cholecalciferol (VITAMIN D) 1000 UNITS tablet, Take 1,000 Units by mouth daily., Disp: , Rfl:  .  losartan (COZAAR) 50 MG tablet, Take 1 tablet (50 mg total) by mouth daily., Disp: 90 tablet, Rfl: 3 .  Melatonin 5 MG TABS, Take 20 mg by mouth at bedtime. , Disp: , Rfl:  .  metoprolol succinate (TOPROL-XL) 25 MG 24 hr tablet, Take 1 tablet (25 mg total) by mouth daily., Disp: 90 tablet, Rfl: 3 .  omeprazole (PRILOSEC) 40 MG capsule, Take 1 capsule (40 mg total) by mouth daily., Disp: 90 capsule, Rfl: 3 .  vitamin B-12 (CYANOCOBALAMIN) 500 MCG tablet, Take 500 mcg by mouth daily., Disp: , Rfl:  .  vitamin E 1000 UNIT capsule, Take 1,000 Units by mouth daily., Disp: , Rfl:  .  Hydrocodone-Acetaminophen (VICODIN) 5-300 MG TABS, 1-2 tab every 4-6hrs for pain (Patient not taking: Reported on 04/27/2015), Disp: 20 each, Rfl: 0 .  levofloxacin (LEVAQUIN) 500 MG tablet, Take 1 tablet (500 mg total) by mouth daily. (Patient not  taking: Reported on 04/27/2015), Disp: 7 tablet, Rfl: 0 .  sertraline (ZOLOFT) 50 MG tablet, Take 1 tablet (50 mg total) by mouth daily., Disp: 30 tablet, Rfl: 3 .  tamsulosin (FLOMAX) 0.4 MG CAPS capsule, Take 1 capsule (0.4 mg total) by mouth daily. (Patient not taking: Reported on 04/27/2015), Disp: 30 capsule, Rfl: 0  No Known Allergies  Review of Systems  Constitutional: Negative for fever, chills and malaise/fatigue.  HENT: Negative for hearing loss.   Eyes: Negative for blurred vision and double vision.  Respiratory: Negative for cough, hemoptysis, shortness of breath and wheezing.   Cardiovascular: Negative for chest pain, orthopnea and leg swelling.  Gastrointestinal: Negative for heartburn, abdominal pain and constipation.  Genitourinary: Negative for dysuria, urgency and frequency.  Musculoskeletal: Negative for myalgias and joint pain.  Neurological: Negative for dizziness, sensory change, focal weakness, weakness and headaches.  Psychiatric/Behavioral: The patient has insomnia.       Objective  Filed Vitals:   04/27/15 0913  BP: 150/82  Pulse: 82  Temp: 98.4 F (36.9 C)  Resp: 16  Height:  (1.93 m)  Weight: 260 lb 12.8 oz (118.298 kg)     Physical Exam  Constitutional: He is oriented to person, place,  and time and well-developed, well-nourished, and in no distress. No distress.  HENT:  Head: Normocephalic and atraumatic.  Eyes: Conjunctivae and EOM are normal. Pupils are equal, round, and reactive to light. No scleral icterus.  Neck: Normal range of motion. Neck supple. Carotid bruit is not present. No thyromegaly present.  Cardiovascular: Normal rate, regular rhythm, normal heart sounds and intact distal pulses.  Exam reveals no gallop and no friction rub.   No murmur heard. Pulmonary/Chest: Effort normal and breath sounds normal. No respiratory distress. He has no wheezes. He has no rales.  Abdominal: Soft. Bowel sounds are normal. He exhibits no  distension, no abdominal bruit and no mass. There is no tenderness.  Musculoskeletal: He exhibits no edema or tenderness.  Lymphadenopathy:    He has no cervical adenopathy.  Neurological: He is alert and oriented to person, place, and time.  Psychiatric: Mood, memory, affect and judgment normal.  Vitals reviewed.     Recent Results (from the past 2160 hour(s))  APTT     Status: None   Collection Time: 02/12/15  2:31 PM  Result Value Ref Range   aPTT 34 24 - 36 seconds  Lupus anticoagulant panel     Status: None   Collection Time: 02/12/15  2:31 PM  Result Value Ref Range   PTT Lupus Anticoagulant 40.9 0.0 - 50.0 sec   DRVVT 52.0 0.0 - 55.1 sec   Lupus Anticoag Interp Comment:     Comment: (NOTE) No lupus anticoagulant was detected. Performed At: [facility] [address] [place], [place] [id number] Mila Homer MD OZ:[id number]   Urinalysis, Complete     Status: Abnormal   Collection Time: 04/15/15 10:31 AM  Result Value Ref Range   Specific Gravity, UA 1.025 1.005 - 1.030   pH, UA 5.5 5.0 - 7.5   Color, UA Yellow Yellow   Appearance Ur Cloudy (A) Clear   Leukocytes, UA Negative Negative   Protein, UA Negative Negative/Trace   Glucose, UA Negative Negative   Ketones, UA Negative Negative   RBC, UA Trace (A) Negative   Bilirubin, UA Negative Negative   Urobilinogen, Ur 0.2 0.2 - 1.0 mg/dL   Nitrite, UA Negative Negative   Microscopic Examination See below:   Microscopic Examination     Status: Abnormal   Collection Time: 04/15/15 10:31 AM  Result Value Ref Range   WBC, UA 6-10 (A) 0 -  5 /hpf   RBC, UA None seen 0 -  2 /hpf   Epithelial Cells (non renal) 0-10 0 - 10 /hpf   Crystals Present (A) N/A   Crystal Type Amorphous Sediment N/A   Bacteria, UA Many (A) None seen/Few  CULTURE, URINE COMPREHENSIVE     Status: None   Collection Time: 04/15/15 11:14 AM  Result Value Ref Range   Urine Culture, Comprehensive Final report    Result 1 Comment      Comment: No growth in 36 - 48 hours.  Comprehensive metabolic panel     Status: Abnormal   Collection Time: 04/15/15 11:16 AM  Result Value Ref Range   Glucose 106 (H) 65 - 99 mg/dL   BUN 14 8 - 27 mg/dL   Creatinine, Ser 6.29 0.76 - 1.27 mg/dL   GFR calc non Af Amer 77 >59 mL/min/1.73   GFR calc Af Amer 89 >59 mL/min/1.73   BUN/Creatinine Ratio 14 10 - 22   Sodium 144 134 - 144 mmol/L   Potassium 4.4 3.5 - 5.2 mmol/L  Chloride 103 97 - 108 mmol/L   CO2 26 18 - 29 mmol/L   Calcium 9.6 8.6 - 10.2 mg/dL   Total Protein 7.1 6.0 - 8.5 g/dL   Albumin 4.0 3.5 - 4.8 g/dL   Globulin, Total 3.1 1.5 - 4.5 g/dL   Albumin/Globulin Ratio 1.3 1.1 - 2.5   Bilirubin Total 1.6 (H) 0.0 - 1.2 mg/dL   Alkaline Phosphatase 95 39 - 117 IU/L   AST 27 0 - 40 IU/L   ALT 14 0 - 44 IU/L  PSA     Status: None   Collection Time: 04/15/15 11:21 AM  Result Value Ref Range   Prostate Specific Ag, Serum 1.6 0.0 - 4.0 ng/mL    Comment: Roche ECLIA methodology. According to the American Urological Association, Serum PSA should decrease and remain at undetectable levels after radical prostatectomy. The AUA defines biochemical recurrence as an initial PSA value 0.2 ng/mL or greater followed by a subsequent confirmatory PSA value 0.2 ng/mL or greater. Values obtained with different assay methods or kits cannot be used interchangeably. Results cannot be interpreted as absolute evidence of the presence or absence of malignant disease.      Assessment & Plan  1. Need for influenza vaccination  - Flu vaccine HIGH DOSE PF (Fluzone High dose)  2. Insomnia  - sertraline (ZOLOFT) 50 MG tablet; Take 1 tablet (50 mg total) by mouth daily.  Dispense: 30 tablet; Refill: 3  3. Essential hypertension  - losartan (COZAAR) 100 MG tablet; Take 1 tablet daily for BP  Dispense: 90 tablet; Refill: 3  4. Hyperlipidemia with target LDL less than 70   5. Right upper lobe pneumonia  - DG Chest 2 View; Future

## 2015-04-29 ENCOUNTER — Ambulatory Visit: Payer: Medicare PPO | Admitting: Obstetrics and Gynecology

## 2015-06-02 ENCOUNTER — Telehealth: Payer: Self-pay | Admitting: Family Medicine

## 2015-06-02 NOTE — Telephone Encounter (Signed)
Pt in  Office requesting refill on  Levofloxacin  500 mg  [facility]   Pt  Cell # is  [phone]

## 2015-06-02 NOTE — Telephone Encounter (Signed)
[facility] tomorrow[facility]

## 2015-06-02 NOTE — Telephone Encounter (Signed)
Why does he need Levoquin.  If he thinks he has an infection, he would have to be seen.  I cannot see him today, but could see him tomorrow AM.

## 2015-06-03 ENCOUNTER — Ambulatory Visit
Admission: RE | Admit: 2015-06-03 | Discharge: 2015-06-03 | Disposition: A | Discharge status: Home / Self Care | Payer: Medicare PPO | Source: Ambulatory Visit | Attending: Family Medicine | Admitting: Family Medicine

## 2015-06-03 ENCOUNTER — Ambulatory Visit (INDEPENDENT_AMBULATORY_CARE_PROVIDER_SITE_OTHER): Payer: Medicare PPO | Admitting: Family Medicine

## 2015-06-03 ENCOUNTER — Encounter: Payer: Self-pay | Admitting: Family Medicine

## 2015-06-03 VITALS — BP 154/84 | HR 54 | Temp 98.3°F | Resp 16 | Ht 76.0 in | Wt 268.0 lb

## 2015-06-03 DIAGNOSIS — J40 Bronchitis, not specified as acute or chronic: Secondary | ICD-10-CM

## 2015-06-03 DIAGNOSIS — G4700 Insomnia, unspecified: Secondary | ICD-10-CM | POA: Diagnosis not present

## 2015-06-03 MED ORDER — AZITHROMYCIN 250 MG PO TABS: ORAL_TABLET | ORAL | Status: DC

## 2015-06-03 MED ORDER — SERTRALINE HCL 50 MG PO TABS: ORAL_TABLET | ORAL | Status: DC

## 2015-06-03 MED ORDER — ALBUTEROL SULFATE HFA 108 (90 BASE) MCG/ACT IN AERS: 2.0000 | INHALATION_SPRAY | Freq: Four times a day (QID) | RESPIRATORY_TRACT | Status: DC | PRN

## 2015-06-03 MED ORDER — AMOXICILLIN-POT CLAVULANATE 875-125 MG PO TABS: 1.0000 | ORAL_TABLET | Freq: Two times a day (BID) | ORAL | Status: DC

## 2015-06-03 NOTE — Progress Notes (Signed)
Name: Craig BaumgartnerJames L [name]   MRN: [id number]    DOB: 02/16/1941   Date:06/03/2015       Progress Note  Subjective  Chief Complaint  Chief Complaint  Patient presents with  . Cough    pneum x 1 month ago.     HPI Here asking for refill of Levaquin.  Treated for pneumonia about 1 month ago.  Treated originally with Augmentin and then with Levaquin.  Pneumonia resolved with normal CXR on 04/27/15.  4 days ago started with sore, scratchy throat, then sneezing and nasal congestion.  Now has cough returning.  Starts as a tickle but has gotten into chest x 2 days.  Productive of sputum (green/yelow sputum).  No fever.  No SOB.  + wheezing.  Also c/o continued insomnia and would lioe stronger dose of Zoloft.  No problem-specific assessment & plan notes found for this encounter.   Past Medical History  Diagnosis Date  . Essential hypertension   . Anxiety   . Hyperlipidemia with target LDL less than 70   . Pulmonary emboli (HCC)   . 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 Smoking status: Former Smoker -- 20 years    Types: Cigarettes  . Smokeless tobacco: Never Used     Comment: Quit tobacco in 1980  . Alcohol Use: No     Comment: quit 30 years ago     Current outpatient prescriptions:  .  atorvastatin (LIPITOR) 40 MG tablet, Take 1 tablet (40 mg total) by mouth daily at 6 PM., Disp: 90 tablet, Rfl: 3 .  cholecalciferol (VITAMIN D) 1000 UNITS tablet, Take 1,000 Units by mouth daily., Disp: , Rfl:  .  losartan (COZAAR) 100 MG tablet, Take 1 tablet daily for BP, Disp: 90 tablet, Rfl: 3 .  Melatonin 5 MG TABS, Take 10 mg by mouth at bedtime. 3 tabs, Disp: , Rfl:  .  metoprolol succinate (TOPROL-XL) 25 MG 24 hr tablet, Take 1 tablet (25 mg total) by mouth daily., Disp: 90 tablet, Rfl: 3 .  omeprazole (PRILOSEC) 40 MG capsule, Take 1 capsule (40 mg total) by mouth daily., Disp: 90 capsule, Rfl: 3 .  sertraline (ZOLOFT) 50 MG tablet, Take 1 tablet (50 mg total) by  mouth daily., Disp: 30 tablet, Rfl: 3 .  tamsulosin (FLOMAX) 0.4 MG CAPS capsule, Take 1 capsule (0.4 mg total) by mouth daily., Disp: 30 capsule, Rfl: 0 .  vitamin B-12 (CYANOCOBALAMIN) 500 MCG tablet, Take 500 mcg by mouth daily., Disp: , Rfl:  .  vitamin E 1000 UNIT capsule, Take 1,000 Units by mouth daily., Disp: , Rfl:   Not on File  Review of Systems  Constitutional: Negative for fever, chills, weight loss and malaise/fatigue.  Respiratory: Positive for cough, sputum production and wheezing. Negative for shortness of breath.   Cardiovascular: Negative for chest pain, palpitations and leg swelling.  Gastrointestinal: Negative for heartburn, abdominal pain and blood in stool.  Genitourinary: Negative for dysuria, urgency and frequency.  Skin: Negative for rash.  Neurological: Negative for weakness.      Objective  Filed Vitals:   06/03/15 0915  BP: 154/84  Pulse: 54  Temp: 98.3 F (36.8 C)  Resp: 16  Height: 6\' 4"  (1.93 m)  Weight: 268 lb (121.564 kg)  SpO2: 98%     Physical Exam  Constitutional: He is well-developed, well-nourished, and in no distress. No distress.  HENT:  Head: Normocephalic and atraumatic.  Right Ear: External ear  normal.  Left Ear: External ear normal.  Nose: Mucosal edema and rhinorrhea present. Right sinus exhibits no maxillary sinus tenderness and no frontal sinus tenderness. Left sinus exhibits no maxillary sinus tenderness and no frontal sinus tenderness.  Mouth/Throat:    Cardiovascular: Normal rate, normal heart sounds and intact distal pulses.   Occasional extrasystoles are present. Exam reveals no gallop and no friction rub.   No murmur heard. Pulmonary/Chest: Effort normal and breath sounds normal. No respiratory distress. He has no wheezes. He has no rales.  Vitals reviewed.     Recent Results (from the past 2160 hour(s))  Urinalysis, Complete     Status: Abnormal   Collection Time: 04/15/15 10:31 AM  Result Value Ref Range    Specific Gravity, UA 1.025 1.005 - 1.030   pH, UA 5.5 5.0 - 7.5   Color, UA Yellow Yellow   Appearance Ur Cloudy (A) Clear   Leukocytes, UA Negative Negative   Protein, UA Negative Negative/Trace   Glucose, UA Negative Negative   Ketones, UA Negative Negative   RBC, UA Trace (A) Negative   Bilirubin, UA Negative Negative   Urobilinogen, Ur 0.2 0.2 - 1.0 mg/dL   Nitrite, UA Negative Negative   Microscopic Examination See below:   Microscopic Examination     Status: Abnormal   Collection Time: 04/15/15 10:31 AM  Result Value Ref Range   WBC, UA 6-10 (A) 0 -  5 /hpf   RBC, UA None seen 0 -  2 /hpf   Epithelial Cells (non renal) 0-10 0 - 10 /hpf   Crystals Present (A) N/A   Crystal Type Amorphous Sediment N/A   Bacteria, UA Many (A) None seen/Few  CULTURE, URINE COMPREHENSIVE     Status: None   Collection Time: 04/15/15 11:14 AM  Result Value Ref Range   Urine Culture, Comprehensive Final report    Result 1 Comment     Comment: No growth in 36 - 48 hours.  Comprehensive metabolic panel     Status: Abnormal   Collection Time: 04/15/15 11:16 AM  Result Value Ref Range   Glucose 106 (H) 65 - 99 mg/dL   BUN 14 8 - 27 mg/dL   Creatinine, Ser 1.61 0.76 - 1.27 mg/dL   GFR calc non Af Amer 77 >59 mL/min/1.73   GFR calc Af Amer 89 >59 mL/min/1.73   BUN/Creatinine Ratio 14 10 - 22   Sodium 144 134 - 144 mmol/L   Potassium 4.4 3.5 - 5.2 mmol/L   Chloride 103 97 - 108 mmol/L   CO2 26 18 - 29 mmol/L   Calcium 9.6 8.6 - 10.2 mg/dL   Total Protein 7.1 6.0 - 8.5 g/dL   Albumin 4.0 3.5 - 4.8 g/dL   Globulin, Total 3.1 1.5 - 4.5 g/dL   Albumin/Globulin Ratio 1.3 1.1 - 2.5   Bilirubin Total 1.6 (H) 0.0 - 1.2 mg/dL   Alkaline Phosphatase 95 39 - 117 IU/L   AST 27 0 - 40 IU/L   ALT 14 0 - 44 IU/L  PSA     Status: None   Collection Time: 04/15/15 11:21 AM  Result Value Ref Range   Prostate Specific Ag, Serum 1.6 0.0 - 4.0 ng/mL    Comment: Roche ECLIA methodology. According to the  American Urological Association, Serum PSA should decrease and remain at undetectable levels after radical prostatectomy. The AUA defines biochemical recurrence as an initial PSA value 0.2 ng/mL or greater followed by a subsequent confirmatory PSA value 0.2  ng/mL or greater. Values obtained with different assay methods or kits cannot be used interchangeably. Results cannot be interpreted as absolute evidence of the presence or absence of malignant disease.      Assessment & Plan  1. Bronchitis  - DG Chest 2 View; Future - albuterol (PROVENTIL HFA;VENTOLIN HFA) 108 (90 BASE) MCG/ACT inhaler; Inhale 2 puffs into the lungs every 6 (six) hours as needed for wheezing or shortness of breath.  Dispense: 1 Inhaler; Refill: 0 - amoxicillin-clavulanate (AUGMENTIN) 875-125 MG tablet; Take 1 tablet by mouth 2 (two) times daily.  Dispense: 20 tablet; Refill: 0 - azithromycin (ZITHROMAX) 250 MG tablet; Take 2 tablets on day 1, then 1 tablet daily on days 2-5  Dispense: 6 tablet; Refill: 0  2. Insomnia  - sertraline (ZOLOFT) 50 MG tablet; Take 1.5 tablets each day  Dispense: 45 tablet; Refill: 6

## 2015-06-15 ENCOUNTER — Ambulatory Visit: Payer: Medicare PPO | Admitting: Family Medicine

## 2015-06-28 ENCOUNTER — Ambulatory Visit (INDEPENDENT_AMBULATORY_CARE_PROVIDER_SITE_OTHER): Payer: Medicare PPO | Admitting: Family Medicine

## 2015-06-28 ENCOUNTER — Encounter: Payer: Self-pay | Admitting: Family Medicine

## 2015-06-28 VITALS — BP 150/80 | HR 63 | Temp 98.0°F | Resp 16 | Ht 74.0 in | Wt 241.0 lb

## 2015-06-28 DIAGNOSIS — E785 Hyperlipidemia, unspecified: Secondary | ICD-10-CM | POA: Diagnosis not present

## 2015-06-28 DIAGNOSIS — G4700 Insomnia, unspecified: Secondary | ICD-10-CM

## 2015-06-28 DIAGNOSIS — F329 Major depressive disorder, single episode, unspecified: Secondary | ICD-10-CM

## 2015-06-28 DIAGNOSIS — I10 Essential (primary) hypertension: Secondary | ICD-10-CM | POA: Diagnosis not present

## 2015-06-28 DIAGNOSIS — F32A Depression, unspecified: Secondary | ICD-10-CM

## 2015-06-28 MED ORDER — AMLODIPINE BESYLATE 5 MG PO TABS: 5.0000 mg | ORAL_TABLET | Freq: Every day | ORAL | Status: DC

## 2015-06-28 MED ORDER — SERTRALINE HCL 50 MG PO TABS: ORAL_TABLET | ORAL | Status: DC

## 2015-06-28 NOTE — Patient Instructions (Signed)
Try to do something in evening to delay going to bed until 10-11 PM.

## 2015-06-28 NOTE — Progress Notes (Signed)
Name: Craig [name]   MRN: [id number]    DOB: [birth year]   Date:06/28/2015       Progress Note  Subjective  Chief Complaint  Chief Complaint  Patient presents with  . Cough    HPI For f/u of HBP.  His cough has resolved. HE still c/o not being able to go to sleep, but on closer questioning, he gets bored at night and tries to go to bed at 8 PM.  Sleeps until 8 AM.  Really just needs to change his lifestyle. No problem-specific assessment & plan notes found for this encounter.   Past Medical History  Diagnosis Date  . Essential hypertension   . Anxiety   . Hyperlipidemia with target LDL less than 70   . Pulmonary emboli (HCC)   . 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[facility])     Past Surgical History  Procedure Laterality Date  . Splenectomy, total      TTP  . Left heart catheterization with coronary angiogram Bilateral 07/27/2014    Procedure: LEFT HEART CATHETERIZATION WITH CORONARY ANGIOGRAM;  Surgeon: Runell Gess, MD;  Location: [facility] CATH LAB;  Service: Cardiovascular;  Laterality: Bilateral;    Family History  Problem Relation Age of Onset  . CAD Father 13  . CAD Brother   . Kidney failure Mother     Social History   Social History  . Marital Status: Single    Spouse Name: N/A  . Number of Children: N/A  . Years of Education: N/A   Occupational History  . Not on file.   Social History Main Topics  . Smoking status: Former Smoker -- 20 years    Types: Cigarettes  . Smokeless tobacco: Never Used     Comment: Quit tobacco in 1980  . Alcohol Use: No     Comment: quit 30 years ago  . Drug Use: Yes  . Sexual Activity: Yes    Birth Control/ Protection: None   Other Topics Concern  . Not on file   Social History Narrative   Retired from [employer]. Retired x 20 years.  Two children.       Current outpatient prescriptions:  .  albuterol (PROVENTIL HFA;VENTOLIN HFA) 108 (90 BASE) MCG/ACT inhaler, Inhale 2 puffs into the lungs every 6 (six) hours as needed for  wheezing or shortness of breath., Disp: 1 Inhaler, Rfl: 0 .  atorvastatin (LIPITOR) 40 MG tablet, Take 1 tablet (40 mg total) by mouth daily at 6 PM., Disp: 90 tablet, Rfl: 3 .  cholecalciferol (VITAMIN D) 1000 UNITS tablet, Take 1,000 Units by mouth daily., Disp: , Rfl:  .  losartan (COZAAR) 100 MG tablet, Take 1 tablet daily for BP, Disp: 90 tablet, Rfl: 3 .  Melatonin 5 MG TABS, Take 10 mg by mouth at bedtime. 3 tabs, Disp: , Rfl:  .  metoprolol succinate (TOPROL-XL) 25 MG 24 hr tablet, Take 1 tablet (25 mg total) by mouth daily., Disp: 90 tablet, Rfl: 3 .  omeprazole (PRILOSEC) 40 MG capsule, Take 1 capsule (40 mg total) by mouth daily., Disp: 90 capsule, Rfl: 3 .  sertraline (ZOLOFT) 50 MG tablet, Take 2 tablets each day., Disp: 60 tablet, Rfl: 6 .  tamsulosin (FLOMAX) 0.4 MG CAPS capsule, Take 1 capsule (0.4 mg total) by mouth daily., Disp: 30 capsule, Rfl: 0 .  vitamin B-12 (CYANOCOBALAMIN) 500 MCG tablet, Take 500 mcg by mouth daily., Disp: , Rfl:  .  vitamin E 1000 UNIT capsule, Take 1,000 Units by mouth  daily., Disp: , Rfl:  .  amLODipine (NORVASC) 5 MG tablet, Take 1 tablet (5 mg total) by mouth daily., Disp: 90 tablet, Rfl: 3  No Known Allergies   Review of Systems  Constitutional: Negative for fever, chills, weight loss and malaise/fatigue.  HENT: Negative for hearing loss.   Eyes: Negative for blurred vision and double vision.  Respiratory: Negative for cough, sputum production, shortness of breath and wheezing.   Cardiovascular: Negative for chest pain, palpitations and leg swelling.  Gastrointestinal: Negative for heartburn, abdominal pain and blood in stool.  Genitourinary: Negative for dysuria, urgency and frequency.  Musculoskeletal: Negative for myalgias and back pain.  Skin: Negative for rash.  Neurological: Negative for dizziness, tremors, weakness and headaches.      Objective  Filed Vitals:   06/28/15 1031 06/28/15 1117  BP: 145/80 150/80  Pulse: 63    Temp: 98 F (36.7 C)   Resp: 16   Height:  (1.88 m)   Weight: 241 lb (109.317 kg)   SpO2: 98%     Physical Exam  Constitutional: He is oriented to person, place, and time and well-developed, well-nourished, and in no distress. No distress.  HENT:  Head: Normocephalic and atraumatic.  Eyes: Conjunctivae and EOM are normal. Pupils are equal, round, and reactive to light. No scleral icterus.  Neck: Normal range of motion. Neck supple. Carotid bruit is not present. No thyromegaly present.  Cardiovascular: Normal rate, regular rhythm, normal heart sounds and intact distal pulses.  Exam reveals no gallop and no friction rub.   No murmur heard. Pulmonary/Chest: Effort normal and breath sounds normal. No respiratory distress. He has no wheezes. He has no rales.  Abdominal: Soft. Bowel sounds are normal. He exhibits no distension, no abdominal bruit and no mass. There is no tenderness.  Musculoskeletal: Normal range of motion. He exhibits no edema.  Lymphadenopathy:    He has no cervical adenopathy.  Neurological: He is alert and oriented to person, place, and time.  Vitals reviewed.      Recent Results (from the past 2160 hour(s))  Urinalysis, Complete     Status: Abnormal   Collection Time: 04/15/15 10:31 AM  Result Value Ref Range   Specific Gravity, UA 1.025 1.005 - 1.030   pH, UA 5.5 5.0 - 7.5   Color, UA Yellow Yellow   Appearance Ur Cloudy (A) Clear   Leukocytes, UA Negative Negative   Protein, UA Negative Negative/Trace   Glucose, UA Negative Negative   Ketones, UA Negative Negative   RBC, UA Trace (A) Negative   Bilirubin, UA Negative Negative   Urobilinogen, Ur 0.2 0.2 - 1.0 mg/dL   Nitrite, UA Negative Negative   Microscopic Examination See below:   Microscopic Examination     Status: Abnormal   Collection Time: 04/15/15 10:31 AM  Result Value Ref Range   WBC, UA 6-10 (A) 0 -  5 /hpf   RBC, UA None seen 0 -  2 /hpf   Epithelial Cells (non renal) 0-10 0 - 10  /hpf   Crystals Present (A) N/A   Crystal Type Amorphous Sediment N/A   Bacteria, UA Many (A) None seen/Few  CULTURE, URINE COMPREHENSIVE     Status: None   Collection Time: 04/15/15 11:14 AM  Result Value Ref Range   Urine Culture, Comprehensive Final report    Result 1 Comment     Comment: No growth in 36 - 48 hours.  Comprehensive metabolic panel     Status: Abnormal  Collection Time: 04/15/15 11:16 AM  Result Value Ref Range   Glucose 106 (H) 65 - 99 mg/dL   BUN 14 8 - 27 mg/dL   Creatinine, Ser 9.600.97 0.76 - 1.27 mg/dL   GFR calc non Af Amer 77 >59 mL/min/1.73   GFR calc Af Amer 89 >59 mL/min/1.73   BUN/Creatinine Ratio 14 10 - 22   Sodium 144 134 - 144 mmol/L   Potassium 4.4 3.5 - 5.2 mmol/L   Chloride 103 97 - 108 mmol/L   CO2 26 18 - 29 mmol/L   Calcium 9.6 8.6 - 10.2 mg/dL   Total Protein 7.1 6.0 - 8.5 g/dL   Albumin 4.0 3.5 - 4.8 g/dL   Globulin, Total 3.1 1.5 - 4.5 g/dL   Albumin/Globulin Ratio 1.3 1.1 - 2.5   Bilirubin Total 1.6 (H) 0.0 - 1.2 mg/dL   Alkaline Phosphatase 95 39 - 117 IU/L   AST 27 0 - 40 IU/L   ALT 14 0 - 44 IU/L  PSA     Status: None   Collection Time: 04/15/15 11:21 AM  Result Value Ref Range   Prostate Specific Ag, Serum 1.6 0.0 - 4.0 ng/mL    Comment: Roche ECLIA methodology. According to the American Urological Association, Serum PSA should decrease and remain at undetectable levels after radical prostatectomy. The AUA defines biochemical recurrence as an initial PSA value 0.2 ng/mL or greater followed by a subsequent confirmatory PSA value 0.2 ng/mL or greater. Values obtained with different assay methods or kits cannot be used interchangeably. Results cannot be interpreted as absolute evidence of the presence or absence of malignant disease.      Assessment & Plan  Problem List Items Addressed This Visit      Cardiovascular and Mediastinum   Essential hypertension - Primary (Chronic)   Relevant Medications   amLODipine  (NORVASC) 5 MG tablet     Other   Hyperlipidemia with target LDL less than 70   Relevant Medications   amLODipine (NORVASC) 5 MG tablet   Insomnia   Relevant Medications   sertraline (ZOLOFT) 50 MG tablet   Depression   Relevant Medications   sertraline (ZOLOFT) 50 MG tablet      Meds ordered this encounter  Medications  . sertraline (ZOLOFT) 50 MG tablet    Sig: Take 2 tablets each day.    Dispense:  60 tablet    Refill:  6  . amLODipine (NORVASC) 5 MG tablet    Sig: Take 1 tablet (5 mg total) by mouth daily.    Dispense:  90 tablet    Refill:  3   1. Essential hypertension  - amLODipine (NORVASC) 5 MG tablet; Take 1 tablet (5 mg total) by mouth daily.  Dispense: 90 tablet; Refill: 3 -cont. Losartan and Metoprolol 2. Depression  - sertraline (ZOLOFT) 50 MG tablet; Take 2 tablets each day.  Dispense: 60 tablet; Refill: 6  3. Insomnia  - sertraline (ZOLOFT) 50 MG tablet; Take 2 tablets each day.  Dispense: 60 tablet; Refill: 6  4. Hyperlipidemia with target LDL less than 70

## 2015-07-20 ENCOUNTER — Emergency Department
Admission: EM | Admit: 2015-07-20 | Discharge: 2015-07-20 | Disposition: A | Discharge status: Home / Self Care | Payer: Medicare PPO | Attending: Emergency Medicine | Admitting: Emergency Medicine

## 2015-07-20 ENCOUNTER — Encounter: Payer: Self-pay | Admitting: Emergency Medicine

## 2015-07-20 ENCOUNTER — Ambulatory Visit (INDEPENDENT_AMBULATORY_CARE_PROVIDER_SITE_OTHER): Payer: Medicare PPO | Admitting: Family Medicine

## 2015-07-20 ENCOUNTER — Other Ambulatory Visit: Payer: Self-pay

## 2015-07-20 ENCOUNTER — Emergency Department: Payer: Medicare PPO

## 2015-07-20 ENCOUNTER — Encounter: Payer: Self-pay | Admitting: Family Medicine

## 2015-07-20 VITALS — BP 130/60 | HR 73 | Temp 98.1°F | Resp 16 | Ht 74.0 in | Wt 242.0 lb

## 2015-07-20 DIAGNOSIS — R0602 Shortness of breath: Secondary | ICD-10-CM | POA: Diagnosis not present

## 2015-07-20 DIAGNOSIS — F329 Major depressive disorder, single episode, unspecified: Secondary | ICD-10-CM

## 2015-07-20 DIAGNOSIS — I10 Essential (primary) hypertension: Secondary | ICD-10-CM | POA: Diagnosis not present

## 2015-07-20 DIAGNOSIS — I2782 Chronic pulmonary embolism: Secondary | ICD-10-CM | POA: Diagnosis not present

## 2015-07-20 DIAGNOSIS — Z87891 Personal history of nicotine dependence: Secondary | ICD-10-CM | POA: Insufficient documentation

## 2015-07-20 DIAGNOSIS — G4700 Insomnia, unspecified: Secondary | ICD-10-CM

## 2015-07-20 DIAGNOSIS — I2699 Other pulmonary embolism without acute cor pulmonale: Secondary | ICD-10-CM | POA: Insufficient documentation

## 2015-07-20 DIAGNOSIS — R634 Abnormal weight loss: Secondary | ICD-10-CM | POA: Diagnosis not present

## 2015-07-20 DIAGNOSIS — Z79899 Other long term (current) drug therapy: Secondary | ICD-10-CM | POA: Diagnosis not present

## 2015-07-20 DIAGNOSIS — I82403 Acute embolism and thrombosis of unspecified deep veins of lower extremity, bilateral: Secondary | ICD-10-CM

## 2015-07-20 DIAGNOSIS — R29898 Other symptoms and signs involving the musculoskeletal system: Secondary | ICD-10-CM

## 2015-07-20 DIAGNOSIS — F32A Depression, unspecified: Secondary | ICD-10-CM

## 2015-07-20 LAB — CBC
HEMATOCRIT: 42.3 % (ref 40.0–52.0)
Hemoglobin: 14.2 g/dL (ref 13.0–18.0)
MCH: 35.3 pg — ABNORMAL HIGH (ref 26.0–34.0)
MCHC: 33.6 g/dL (ref 32.0–36.0)
MCV: 105.2 fL — ABNORMAL HIGH (ref 80.0–100.0)
Platelets: 214 10*3/uL (ref 150–440)
RBC: 4.02 MIL/uL — ABNORMAL LOW (ref 4.40–5.90)
RDW: 14.6 % — AB (ref 11.5–14.5)
WBC: 7.8 10*3/uL (ref 3.8–10.6)

## 2015-07-20 LAB — BASIC METABOLIC PANEL
ANION GAP: 8 (ref 5–15)
BUN: 16 mg/dL (ref 6–20)
CHLORIDE: 108 mmol/L (ref 101–111)
CO2: 25 mmol/L (ref 22–32)
Calcium: 9.5 mg/dL (ref 8.9–10.3)
Creatinine, Ser: 0.8 mg/dL (ref 0.61–1.24)
GLUCOSE: 102 mg/dL — AB (ref 65–99)
Potassium: 3.9 mmol/L (ref 3.5–5.1)
Sodium: 141 mmol/L (ref 135–145)

## 2015-07-20 LAB — PROTIME-INR
INR: 0.92
PROTHROMBIN TIME: 12.6 s (ref 11.4–15.0)

## 2015-07-20 LAB — TROPONIN I

## 2015-07-20 LAB — APTT: APTT: 24 s (ref 24–36)

## 2015-07-20 MED ORDER — SERTRALINE HCL 50 MG PO TABS: ORAL_TABLET | ORAL | Status: DC

## 2015-07-20 MED ORDER — RIVAROXABAN (XARELTO) VTE STARTER PACK (15 & 20 MG): ORAL_TABLET | ORAL | Status: DC

## 2015-07-20 MED ORDER — IOHEXOL 350 MG/ML SOLN
100.0000 mL | Freq: Once | INTRAVENOUS | Status: AC | PRN
  Administered 2015-07-20: 100 mL via INTRAVENOUS

## 2015-07-20 NOTE — Discharge Instructions (Signed)
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  Fortunately her vital signs are stable and the emboli are much smaller than when you had the embolism in the past.  I have discussed her case with Dr. Sueanne Margarita, a hematologist, who will call to schedule a follow-up clinic appointment with you within the next 2 weeks.  In the meantime, please start back on Xarelto and take it exactly as instructed: 15 mg by mouth twice daily with meals for 21 days, then switch to 20 mg by mouth once daily with a meal on day 22 and moving forward.  Return to the emergency department with any new or worsening symptoms that concern you.   Pulmonary Embolism A pulmonary embolism (PE) is a sudden blockage or decrease of blood flow in one lung or both lungs. Most blockages come from a blood clot that travels from the legs or the pelvis to the lungs. PE is a dangerous and potentially life-threatening condition if it is not treated right away. CAUSES A pulmonary embolism occurs most commonly when a blood clot travels from one of your veins to your lungs. Rarely, PE is caused by air, fat, amniotic fluid, or part of a tumor traveling through your veins to your lungs. RISK FACTORS A PE is more likely to develop in:  People who smoke.  People who areolder, especially over 6 years of age.  People who are overweight (obese).  People who sit or lie still for a long time, such as during long-distance travel (over 4 hours), bed rest, hospitalization, or during recovery from certain medical conditions like a stroke.  People who do not engage in much physical activity (sedentary lifestyle).  People who have chronic breathing disorders.  People whohave a personal or family history of blood clots or blood clotting disease.  People whohave peripheral vascular disease (PVD), diabetes, or some types of cancer.  People who haveheart disease, especially if the person had a recent heart  attack or has congestive heart failure.  People who have neurological diseases that affect the legs (leg paresis).  People who have had a traumatic injury, such as breaking a hip or leg.  People whohave recently had major or lengthy surgery, especially on the hip, knee, or abdomen.  People who have hada central line placed inside a large vein.  People who takemedicines that contain the hormone estrogen. These include birth control pills and hormone replacement therapy.  Pregnancy or during childbirth or the postpartum period. SIGNS AND SYMPTOMS  The symptoms of a PE usually start suddenly and include:  Shortness of breath while active or at rest.  Coughing or coughing up blood or blood-tinged mucus.  Chest pain that is often worse with deep breaths.  Rapid or irregular heartbeat.  Feeling light-headed or dizzy.  Fainting.  Feelinganxious.  Sweating. There may also be pain and swelling in a leg if that is where the blood clot started. These symptoms may represent a serious problem that is an emergency. Do not wait to see if the symptoms will go away. Get medical help right away. Call your local emergency services (911 in the [place] Do not drive yourself to the hospital. DIAGNOSIS Your health care provider will take a medical history and perform a physical exam. You may also have other tests, including:  Blood tests to assess the clotting properties of your blood, assess oxygen levels in your blood, and find blood clots.  Imaging tests, such as CT,  ultrasound, MRI, X-ray, and other tests to see if you have clots anywhere in your body.  An electrocardiogram (ECG) to look for heart strain from blood clots in the lungs. TREATMENT The main goals of PE treatment are:  To stop a blood clot from growing larger.  To stop new blood clots from forming. The type of treatment that you receive depends on many factors, such as the cause of your PE, your risk for bleeding or  developing more clots, and other medical conditions that you have. Sometimes, a combination of treatments is necessary. This condition may be treated with:  Medicines, including newer oral blood thinners (anticoagulants), warfarin, low molecular weight heparins, thrombolytics, or heparins.  Wearing compression stockings or using different types of devices.  Surgery (rare) to remove the blood clot or to place a filter in your abdomen to stop the blood clot from traveling to your lungs. Treatments for a PE are often divided into immediate treatment, long-term treatment (up to 3 months after PE), and extended treatment (more than 3 months after PE). Your treatment may continue for several months. This is called maintenance therapy, and it is used to prevent the forming of new blood clots. You can work with your health care provider to choose the treatment program that is best for you. What are anticoagulants? Anticoagulants are medicines that treat PEs. They can stop current blood clots from growing and stop new clots from forming. They cannot dissolve existing clots. Your body dissolves clots by itself over time. Anticoagulants are given by mouth, by injection, or through an IV tube. What are thrombolytics? Thrombolytics are clot-dissolving medicines that are used to dissolve a PE. They carry a high risk of bleeding, so they tend to be used only in severe cases or if you have very low blood pressure. HOME CARE INSTRUCTIONS If you are taking a newer oral anticoagulant:  Take the medicine every single day at the same time each day.  Understand what foods and drugs interact with this medicine.  Understand that there are no regular blood tests required when using this medicine.  Understandthe side effects of this medicine, including excessive bruising or bleeding. Ask your health care provider or pharmacist about other possible side effects. If you are taking warfarin:  Understand how to take  warfarin and know which foods can affect how warfarin works in Public relations account executive.  Understand that it is dangerous to taketoo much or too little warfarin. Too much warfarin increases the risk of bleeding. Too little warfarin continues to allow the risk for blood clots.  Follow your PT and INR blood testing schedule. The PT and INR results allow your health care provider to adjust your dose of warfarin. It is very important that you have your PT and INR tested as often as told by your health care provider.  Avoid major changes in your diet, or tell your health care provider before you change your diet. Arrange a visit with a registered dietitian to answer your questions. Many foods, especially foods that are high in vitamin K, can interfere with warfarin and affect the PT and INR results. Eat a consistent amount of foods that are high in vitamin K, such as:  Spinach, kale, broccoli, cabbage, collard greens, turnip greens, Brussels sprouts, peas, cauliflower, seaweed, and parsley.  Beef liver and pork liver.  Green tea.  Soybean oil.  Tell your health care provider about any and all medicines, vitamins, and supplements that you take, including aspirin and other over-the-counter anti-inflammatory  medicines. Be especially cautious with aspirin and anti-inflammatory medicines. Do not take those before you ask your health care provider if it is safe to do so. This is important because many medicines can interfere with warfarin and affect the PT and INR results.  Do not start or stop taking any over-the-counter or prescription medicine unless your health care provider or pharmacist tells you to do so. If you take warfarin, you will also need to do these things:  Hold pressure over cuts for longer than usual.  Tell your dentist and other health care providers that you are taking warfarin before you have any procedures in which bleeding may occur.  Avoid alcohol or drink very small amounts. Tell your  health care provider if you change your alcohol intake.  Do not use tobacco products, including cigarettes, chewing tobacco, and e-cigarettes. If you need help quitting, ask your health care provider.  Avoid contact sports. General Instructions  Take over-the-counter and prescription medicines only as told by your health care provider. Anticoagulant medicines can have side effects, including easy bruising and difficulty stopping bleeding. If you are prescribed an anticoagulant, you will also need to do these things:  Hold pressure over cuts for longer than usual.  Tell your dentist and other health care providers that you are taking anticoagulants before you have any procedures in which bleeding may occur.  Avoid contact sports.  Wear a medical alert bracelet or carry a medical alert card that says you have had a PE.  Ask your health care provider how soon you can go back to your normal activities. Stay active to prevent new blood clots from forming.  Make sure to exercise while traveling or when you have been sitting or standing for a long period of time. It is very important to exercise. Exercise your legs by walking or by tightening and relaxing your leg muscles often. Take frequent walks.  Wear compression stockings as told by your health care provider to help prevent more blood clots from forming.  Do not use tobacco products, including cigarettes, chewing tobacco, and e-cigarettes. If you need help quitting, ask your health care provider.  Keep all follow-up appointments with your health care provider. This is important. PREVENTION Take these actions to decrease your risk of developing another PE:  Exercise regularly. For at least 30 minutes every day, engage in:  Activity that involves moving your arms and legs.  Activity that encourages good blood flow through your body by increasing your heart rate.  Exercise your arms and legs every hour during long-distance travel (over  4 hours). Drink plenty of water and avoid drinking alcohol while traveling.  Avoid sitting or lying in bed for long periods of time without moving your legs.  Maintain a weight that is appropriate for your height. Ask your health care provider what weight is healthy for you.  If you are a woman who is over 73 years of age, avoid unnecessary use of medicines that contain estrogen. These include birth control pills.  Do not smoke, especially if you take estrogen medicines. If you need help quitting, ask your health care provider.  If you are at very high risk for PE, wear compression stockings.  If you recently had a PE, have regularly scheduled ultrasound testing on your legs to check for new blood clots. If you are hospitalized, prevention measures may include:  Early walking after surgery, as soon as your health care provider says that it is safe.  Receiving anticoagulants to  prevent blood clots. If you cannot take anticoagulants, other options may be available, such as wearing compression stockings or using different types of devices. SEEK IMMEDIATE MEDICAL CARE IF:  You have new or increased pain, swelling, or redness in an arm or leg.  You have numbness or tingling in an arm or leg.  You have shortness of breath while active or at rest.  You have chest pain.  You have a rapid or irregular heartbeat.  You feel light-headed or dizzy.  You cough up blood.  You notice blood in your vomit, bowel movement, or urine.  You have a fever. These symptoms may represent a serious problem that is an emergency. Do not wait to see if the symptoms will go away. Get medical help right away. Call your local emergency services (911 in the [place] Do not drive yourself to the hospital.   This information is not intended to replace advice given to you by your health care provider. Make sure you discuss any questions you have with your health care provider.   Document Released: 07/07/2000  Document Revised: 03/31/2015 Document Reviewed: 11/04/2014 Elsevier Interactive Patient Education [employer]

## 2015-07-20 NOTE — ED Provider Notes (Signed)
[facility] Emergency Department Provider Note  ____________________________________________  Time seen: Approximately 4:58 PM  I have reviewed the triage vital signs and the nursing notes.   HISTORY  Chief Complaint Shortness of Breath    HPI Craig [name] is a 74 y.o. male with a prior history of pulmonary embolism with right heart strain and he was on Xarelto for 6 months before he was taken off of the (approximately 6 months ago) who presents with gradual onset of worsening shortness of breath.  Exertion makes the symptoms worse and rest makes it somewhat better.  He describes it as severe at its most intense.  It is not accompanied with chest pain.  He denies fever/chills, cough, upper respiratory symptoms, abdominal pain, nausea/vomiting.  He states it feels just like before when he had a pulmonary embolism.  He was managed previously by a pulmonologist at [facility] who is one that took him off of the Xarelto 6 months ago.  He is not on any other anticoagulation at this time.   Past Medical History  Diagnosis Date  . Essential hypertension   . Anxiety   . Hyperlipidemia with target LDL less than 70   . Pulmonary emboli (HCC)   . 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 SOB (shortness of breath) 07/20/2015  . Weakness of both legs 07/20/2015  . Weight loss 07/20/2015  . Depression 06/28/2015  . Insomnia 04/27/2015  . 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 HLD (hyperlipidemia) 04/15/2015  . Temporary cerebral vascular dysfunction 04/15/2015  . Pneumonia due to organism 04/05/2015  . 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 Pulmonary embolus with infarction - Bilateral 07/29/2014  . 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 Elevated troponin 07/28/2014  . Hyperlipidemia with target LDL less than 70   . Essential hypertension 07/25/2014    Past Surgical History  Procedure Laterality Date  . Splenectomy, total       TTP  . Left heart catheterization with coronary angiogram Bilateral 07/27/2014    Procedure: LEFT HEART CATHETERIZATION WITH CORONARY ANGIOGRAM;  Surgeon: Runell Gess, MD;  Location: [facility] CATH LAB;  Service: Cardiovascular;  Laterality: Bilateral;    Current Outpatient Rx  Name  Route  Sig  Dispense  Refill  . albuterol (PROVENTIL HFA;VENTOLIN HFA) 108 (90 BASE) MCG/ACT inhaler   Inhalation   Inhale 2 puffs into the lungs every 6 (six) hours as needed for wheezing or shortness of breath.   1 Inhaler   0   . amLODipine (NORVASC) 5 MG tablet   Oral   Take 1 tablet (5 mg total) by mouth daily.   90 tablet   3   . atorvastatin (LIPITOR) 40 MG tablet   Oral   Take 1 tablet (40 mg total) by mouth daily at 6 PM.   90 tablet   3   . cholecalciferol (VITAMIN D) 1000 UNITS tablet   Oral   Take 1,000 Units by mouth daily.         Marland Kitchen losartan (COZAAR) 100 MG tablet      Take 1 tablet daily for BP   90 tablet   3   . Melatonin 5 MG TABS   Oral   Take 10 mg by mouth at bedtime. 3 tabs         . metoprolol succinate (TOPROL-XL) 25 MG 24 hr tablet   Oral   Take 1 tablet (25 mg  total) by mouth daily.   90 tablet   3   . omeprazole (PRILOSEC) 40 MG capsule   Oral   Take 1 capsule (40 mg total) by mouth daily.   90 capsule   3   . Rivaroxaban (XARELTO STARTER PACK) 15 & 20 MG TBPK      Take as directed on package: Start with one  tablet by mouth twice a day with food. On Day 22, switch to one  tablet once a day with food.   51 each   0   . sertraline (ZOLOFT) 50 MG tablet      Take 1 tablet each day.   30 tablet   6   . tamsulosin (FLOMAX) 0.4 MG CAPS capsule   Oral   Take 1 capsule (0.4 mg total) by mouth daily. Patient not taking: Reported on 07/20/2015   30 capsule   0   . vitamin B-12 (CYANOCOBALAMIN) 500 MCG tablet   Oral   Take 500 mcg by mouth daily.         . vitamin E 1000 UNIT capsule   Oral   Take 1,000 Units by mouth daily.            Allergies Review of patient's allergies indicates no known allergies.  Family History  Problem Relation Age of Onset  . CAD Father 82  . CAD Brother   . Kidney failure Mother     Social History Social History  Substance Use Topics  . Smoking status: Former Smoker -- 20 years    Types: Cigarettes  . Smokeless tobacco: Never Used     Comment: Quit tobacco in 1980  . Alcohol Use: No     Comment: quit 30 years ago    Review of Systems Constitutional: No fever/chills Eyes: No visual changes. ENT: No sore throat. Cardiovascular: Denies chest pain. Respiratory: Gradual onset worsening shortness of breath particularly with exertion Gastrointestinal: No abdominal pain.  No nausea, no vomiting.  No diarrhea.  No constipation. Genitourinary: Negative for dysuria. Musculoskeletal: Negative for back pain. Skin: Negative for rash. Neurological: Negative for headaches, focal weakness or numbness.  10-point ROS otherwise negative.  ____________________________________________   PHYSICAL EXAM:  VITAL SIGNS: ED Triage Vitals  Enc Vitals Group     BP 07/20/15 1511 141/70 mmHg     Pulse Rate 07/20/15 1511 70     Resp 07/20/15 1511 18     Temp 07/20/15 1511 97.9 F (36.6 C)     Temp Source 07/20/15 1511 Oral     SpO2 07/20/15 1511 92 %     Weight 07/20/15 1513 235 lb (106.595 kg)     Height 07/20/15 1513  (1.905 m)     Head Cir --      Peak Flow --      Pain Score --      Pain Loc --      Pain Edu? --      Excl. in GC? --     Constitutional: Alert and oriented. Well appearing and in no acute distress. Eyes: Conjunctivae are normal. PERRL. EOMI. Head: Atraumatic. Nose: No congestion/rhinnorhea. Mouth/Throat: Mucous membranes are moist.  Oropharynx non-erythematous. Neck: No stridor.   Cardiovascular: Normal rate, regular rhythm. Grossly normal heart sounds.  Good peripheral circulation. Respiratory: Normal respiratory effort.  No retractions. Lungs  CTAB. Gastrointestinal: Soft and nontender. No distention. No abdominal bruits. No CVA tenderness. Musculoskeletal: No lower extremity tenderness nor edema.  No joint effusions. Neurologic:  Normal  speech and language. No gross focal neurologic deficits are appreciated.  Skin:  Skin is warm, dry and intact. No rash noted. Psychiatric: Mood and affect are normal. Speech and behavior are normal.  ____________________________________________   LABS (all labs ordered are listed, but only abnormal results are displayed)  Labs Reviewed  CBC - Abnormal; Notable for the following:    RBC 4.02 (*)    MCV 105.2 (*)    MCH 35.3 (*)    RDW 14.6 (*)    All other components within normal limits  BASIC METABOLIC PANEL - Abnormal; Notable for the following:    Glucose, Bld 102 (*)    All other components within normal limits  PROTIME-INR  APTT  TROPONIN I   ____________________________________________  EKG  ED ECG REPORT I, Bevin Das, the attending physician, personally viewed and interpreted this ECG.   Date: 07/20/2015   EKG Time: 15:18  Rate: 62  Rhythm: normal sinus rhythm  Axis: Normal  Intervals:right bundle branch block  ST&T Change: No significant ST changes that would signify ST depression or elevation or ischemia.  Prior EKGs do not include right bundle branch block but his EKGs were dynamic in the past due to a submassive PE and significant right heart strain.  ____________________________________________  RADIOLOGY I, Loleta Rose, personally discussed these images and results by phone with the on-call radiologist and used this discussion as part of my medical decision making.    Dg Chest 2 View  07/20/2015  CLINICAL DATA:  74 year old male with increasing shortness breath EXAM: CHEST  2 VIEW COMPARISON:  Prior chest x-ray 06/03/2015 FINDINGS: The lungs are clear and negative for focal airspace consolidation, pulmonary edema or suspicious pulmonary nodule. No pleural  effusion or pneumothorax. Cardiac and mediastinal contours are within normal limits. Atherosclerotic calcifications noted in the transverse aorta. No acute fracture or lytic or blastic osseous lesions. The visualized upper abdominal bowel gas pattern is unremarkable. Surgical clips in the left upper quadrant. IMPRESSION: No active cardiopulmonary disease. Electronically Signed   By: Malachy Moan M.D.   On: 07/20/2015 15:59   Ct Angio Chest Pe W/cm &/or Wo Cm  07/20/2015  CLINICAL DATA:  Shortness of breath on exertion EXAM: CT ANGIOGRAPHY CHEST WITH CONTRAST TECHNIQUE: Multidetector CT imaging of the chest was performed using the standard protocol during bolus administration of intravenous contrast. Multiplanar CT image reconstructions and MIPs were obtained to evaluate the vascular anatomy. CONTRAST:  OMNIPAQUE IOHEXOL 350 MG/ML SOLN COMPARISON:  10/28/2014, 07/28/2014 FINDINGS: The lungs are well aerated bilaterally. No focal infiltrate or sizable effusion is seen. No pulmonary nodule is noted. The thoracic inlet is within normal limits. Aortic calcifications are seen without aneurysmal dilatation or dissection. Pulmonary artery is well visualized and demonstrates a normal branching pattern. Filling defects are seen within the lower lobe pulmonary arterial branch particularly on the right but to a lesser degree on the left. Right ventricle is mildly prominent but stable from the prior exam. Coronary calcifications are again noted. No hilar or mediastinal adenopathy is seen. The visualized upper abdomen is stable in appearance. No bony abnormality is seen. Review of the MIP images confirms the above findings. IMPRESSION: Evidence of pulmonary emboli involving predominately the lower lobe branches on the right. No other focal abnormality is noted. Critical Value/emergent results were called by telephone at the time of interpretation on 07/20/2015 at 4:57 pm to Dr. York Cerise, who verbally acknowledged  these results. Electronically Signed   By: Eulah Pont.D.  On: 07/20/2015 16:59    ____________________________________________   PROCEDURES  Procedure(s) performed: None  Critical Care performed: None ____________________________________________   INITIAL IMPRESSION / ASSESSMENT AND PLAN / ED COURSE  Pertinent labs & imaging results that were available during my care of the patient were reviewed by me and considered in my medical decision making (see chart for details).  Although the patient does have multiple small pulmonary emboli in his right lung, his vital signs are stable, he is not hypoxemic, and he is in no acute distress.  I discussed the case by phone with Dr. Sueanne MargaritaFinnigan, the hematologist on-call, who agrees that outpatient management is appropriate.  We will start the patient back on Xarelto per manufacturer recommendations including the loading dose for 21 days.  I gave the patient strict instructions that he should stick to dosing instructions rather than just using the 20 mg tablet CRT has at home from his prior administration.  Dr. Sueanne MargaritaFinnigan will contact the patient for a follow-up appointment within the next 2 weeks.    I gave my usual and customary return precautions.     ____________________________________________  FINAL CLINICAL IMPRESSION(S) / ED DIAGNOSES  Final diagnoses:  Pulmonary embolism, other (HCC)      NEW MEDICATIONS STARTED DURING THIS VISIT:  Discharge Medication List as of 07/20/2015  5:59 PM    START taking these medications   Details  Rivaroxaban (XARELTO STARTER PACK) 15 & 20 MG TBPK Take as directed on package: Start with one 15mg  tablet by mouth twice a day with food. On Day 22, switch to one 20mg  tablet once a day with food., Print         Loleta Roseory Takeya Marquis, MD 07/20/15 [phone]

## 2015-07-20 NOTE — Progress Notes (Signed)
Name: Craig [name]   MRN: [id number]    DOB: 1940-10-18   Date:07/20/2015       Progress Note  Subjective  Chief Complaint  Chief Complaint  Patient presents with  . Hypertension    HPI  Here for f/u of BP.  He reports BPs up to 160 to 180.  Says he feels like he did when he had DVT and PE in the past.  He is concerned that he is off Xarelto.  Unstable walking.  Feels lightheaded.  Feels that the Zoloft is not getting any better.  He has cut his Zoloft back to 50 mg/d x 2 days. No problem-specific assessment & plan notes found for this encounter.   Past Medical History  Diagnosis Date  . Essential hypertension   . Anxiety   . Hyperlipidemia with target LDL less than 70   . Pulmonary emboli (HCC)   . 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 Splenectomy, total      TTP  . Left heart catheterization with coronary angiogram Bilateral 07/27/2014    Procedure: LEFT HEART CATHETERIZATION WITH CORONARY ANGIOGRAM;  Surgeon: Runell Gess, MD;  Location: [facility] CATH LAB;  Service: Cardiovascular;  Laterality: Bilateral;    Family History  Problem Relation Age of Onset  . CAD Father 38  . CAD Brother   . Kidney failure Mother     Social History   Social History  . Marital Status: Single    Spouse Name: N/A  . Number of Children: N/A  . Years of Education: N/A   Occupational History  . Not on file.   Social History Main Topics  . Smoking status: Former Smoker -- 20 years    Types: Cigarettes  . Smokeless tobacco: Never Used     Comment: Quit tobacco in 1980  . Alcohol Use: No     Comment: quit 30 years ago  . Drug Use: Yes  . Sexual Activity: Yes    Birth Control/ Protection: None   Other Topics Concern  . Not on file   Social History Narrative   Retired from [employer]. Retired x 20 years.  Two children.       Current outpatient prescriptions:  .  albuterol (PROVENTIL HFA;VENTOLIN HFA) 108 (90 BASE) MCG/ACT  inhaler, Inhale 2 puffs into the lungs every 6 (six) hours as needed for wheezing or shortness of breath., Disp: 1 Inhaler, Rfl: 0 .  amLODipine (NORVASC) 5 MG tablet, Take 1 tablet (5 mg total) by mouth daily., Disp: 90 tablet, Rfl: 3 .  atorvastatin (LIPITOR) 40 MG tablet, Take 1 tablet (40 mg total) by mouth daily at 6 PM., Disp: 90 tablet, Rfl: 3 .  cholecalciferol (VITAMIN D) 1000 UNITS tablet, Take 1,000 Units by mouth daily., Disp: , Rfl:  .  losartan (COZAAR) 100 MG tablet, Take 1 tablet daily for BP, Disp: 90 tablet, Rfl: 3 .  Melatonin 5 MG TABS, Take 10 mg by mouth at bedtime. 3 tabs, Disp: , Rfl:  .  metoprolol succinate (TOPROL-XL) 25 MG 24 hr tablet, Take 1 tablet (25 mg total) by mouth daily., Disp: 90 tablet, Rfl: 3 .  omeprazole (PRILOSEC) 40 MG capsule, Take 1 capsule (40 mg total) by mouth daily., Disp: 90 capsule, Rfl: 3 .  sertraline (ZOLOFT) 50 MG tablet, Take 1 tablet each day., Disp: 30 tablet, Rfl: 6 .  vitamin B-12 (CYANOCOBALAMIN) 500 MCG tablet, Take 500 mcg by  mouth daily., Disp: , Rfl:  .  vitamin E 1000 UNIT capsule, Take 1,000 Units by mouth daily., Disp: , Rfl:  .  tamsulosin (FLOMAX) 0.4 MG CAPS capsule, Take 1 capsule (0.4 mg total) by mouth daily. (Patient not taking: Reported on 07/20/2015), Disp: 30 capsule, Rfl: 0  No Known Allergies   Review of Systems  Constitutional: Positive for malaise/fatigue. Negative for fever, chills and weight loss.  HENT: Negative for hearing loss.   Eyes: Negative for blurred vision and double vision.  Respiratory: Positive for shortness of breath. Negative for cough, sputum production and wheezing.   Cardiovascular: Negative for chest pain, palpitations and leg swelling.  Gastrointestinal: Negative for heartburn, nausea, abdominal pain and blood in stool.  Genitourinary: Negative for dysuria, urgency and frequency.  Musculoskeletal: Negative for myalgias.  Skin: Negative for rash.  Neurological: Positive for weakness.  Negative for headaches.      Objective  Filed Vitals:   07/20/15 1254 07/20/15 1322  BP: 145/73 130/60  Pulse: 73   Temp: 98.1 F (36.7 C)   TempSrc: Oral   Resp: 16   Height:  (1.88 m)   Weight: 242 lb (109.77 kg)     Physical Exam  Constitutional: He is oriented to person, place, and time and well-developed, well-nourished, and in no distress. No distress.  HENT:  Head: Normocephalic and atraumatic.  Neck: Normal range of motion. Neck supple. Carotid bruit is not present. No thyromegaly present.  Cardiovascular: Regular rhythm.  Exam reveals no gallop and no friction rub.   Murmur heard.  Diastolic murmur is present with a grade of 2/6  ULSB  Pulmonary/Chest: Effort normal and breath sounds normal. No respiratory distress. He has no wheezes. He has no rales.  Abdominal: Soft. Bowel sounds are normal. He exhibits no distension and no mass. There is no tenderness.  Musculoskeletal: He exhibits edema (trace bilateral pedal edema.).  Lymphadenopathy:    He has no cervical adenopathy.  Neurological: He is alert and oriented to person, place, and time.  Vitals reviewed.      No results found for this or any previous visit (from the past 2160 hour(s)).   Assessment & Plan  Problem List Items Addressed This Visit      Cardiovascular and Mediastinum   Pulmonary embolus with infarction - Bilateral   DVT of lower extremity, bilateral (HCC)   Essential hypertension (Chronic)   Relevant Orders   Comprehensive Metabolic Panel (CMET)   Lipid Profile     Nervous and Auditory   Weakness of both legs   Relevant Orders   CBC with Differential   TSH     Other   Insomnia - Primary   Relevant Medications   sertraline (ZOLOFT) 50 MG tablet   Depression   Relevant Medications   sertraline (ZOLOFT) 50 MG tablet   SOB (shortness of breath)   Relevant Orders   CT Chest Wo Contrast   Weight loss   Relevant Orders   TSH      Meds ordered this encounter   Medications  . sertraline (ZOLOFT) 50 MG tablet    Sig: Take 1 tablet each day.    Dispense:  30 tablet    Refill:  6   1. Insomnia  - sertraline (ZOLOFT) 50 MG tablet; Take 1 tablet each day.  Dispense: 30 tablet; Refill: 6  2. Depression  - sertraline (ZOLOFT) 50 MG tablet; Take 1 tablet each day.  Dispense: 30 tablet; Refill: 6  3. SOB (shortness  of breath)  - CT Chest Wo Contrast; Future  4. Weakness of both legs  - CBC with Differential - TSH  5. Weight loss  - TSH  6. Essential hypertension  - Comprehensive Metabolic Panel (CMET) - Lipid Profile  7. Other chronic pulmonary embolism (HCC)   8. Deep vein thrombosis (DVT) of both lower extremities, unspecified chronicity, unspecified vein (HCC)

## 2015-07-20 NOTE — ED Notes (Signed)
Pt presents with shortness of breath worse on exertion, pt states he feels like he weighs more than he really does. Pt does have a hx of PE and states is having same sx as before. No acute distress noted in triage.

## 2015-07-21 ENCOUNTER — Other Ambulatory Visit: Payer: Self-pay | Admitting: Family Medicine

## 2015-07-21 DIAGNOSIS — R0602 Shortness of breath: Secondary | ICD-10-CM

## 2015-07-21 DIAGNOSIS — I10 Essential (primary) hypertension: Secondary | ICD-10-CM

## 2015-07-21 DIAGNOSIS — N200 Calculus of kidney: Secondary | ICD-10-CM

## 2015-07-21 DIAGNOSIS — I82403 Acute embolism and thrombosis of unspecified deep veins of lower extremity, bilateral: Secondary | ICD-10-CM

## 2015-07-21 DIAGNOSIS — I2699 Other pulmonary embolism without acute cor pulmonale: Secondary | ICD-10-CM

## 2015-07-22 LAB — TSH: TSH: 1.17 u[IU]/mL (ref 0.450–4.500)

## 2015-07-22 LAB — LIPID PANEL
Chol/HDL Ratio: 1.6 ratio (ref 0.0–5.0)
Cholesterol, Total: 160 mg/dL (ref 100–199)
HDL: 102 mg/dL
Triglycerides: 390 mg/dL — ABNORMAL HIGH (ref 0–149)
VLDL Cholesterol Cal: 78 mg/dL — ABNORMAL HIGH (ref 5–40)

## 2015-07-27 ENCOUNTER — Ambulatory Visit: Payer: Medicare PPO | Admitting: Family Medicine

## 2015-07-28 ENCOUNTER — Ambulatory Visit: Payer: Medicare PPO | Admitting: Family Medicine

## 2015-08-05 ENCOUNTER — Encounter: Payer: Self-pay | Admitting: Oncology

## 2015-08-05 ENCOUNTER — Inpatient Hospital Stay: Payer: Medicare PPO

## 2015-08-05 ENCOUNTER — Inpatient Hospital Stay: Payer: Medicare PPO | Attending: Oncology | Admitting: Oncology

## 2015-08-05 VITALS — BP 139/87 | HR 87 | Temp 96.0°F | Wt 240.5 lb

## 2015-08-05 DIAGNOSIS — I2699 Other pulmonary embolism without acute cor pulmonale: Secondary | ICD-10-CM

## 2015-08-05 DIAGNOSIS — Z9081 Acquired absence of spleen: Secondary | ICD-10-CM | POA: Insufficient documentation

## 2015-08-05 DIAGNOSIS — I10 Essential (primary) hypertension: Secondary | ICD-10-CM | POA: Diagnosis not present

## 2015-08-05 DIAGNOSIS — Z87891 Personal history of nicotine dependence: Secondary | ICD-10-CM

## 2015-08-05 DIAGNOSIS — Z79899 Other long term (current) drug therapy: Secondary | ICD-10-CM | POA: Diagnosis not present

## 2015-08-05 DIAGNOSIS — F419 Anxiety disorder, unspecified: Secondary | ICD-10-CM | POA: Diagnosis not present

## 2015-08-05 DIAGNOSIS — Z7901 Long term (current) use of anticoagulants: Secondary | ICD-10-CM

## 2015-08-05 DIAGNOSIS — Z86711 Personal history of pulmonary embolism: Secondary | ICD-10-CM

## 2015-08-05 DIAGNOSIS — E785 Hyperlipidemia, unspecified: Secondary | ICD-10-CM | POA: Insufficient documentation

## 2015-08-05 LAB — ANTITHROMBIN III: AntiThromb III Func: 92 % (ref 75–120)

## 2015-08-06 NOTE — Progress Notes (Signed)
[facility]  [phone] [phone]  ID: Craig [name] OB: September 13, 1940  MR#: [id number]  YQM#:[id number]  Patient Care Team: Janeann Forehand., MD as PCP - General (Family Medicine)  CHIEF COMPLAINT:  Chief Complaint  Patient presents with  . New Patient (Initial Visit)    referrel; PE    INTERVAL HISTORY: Patient is a 75 year old male who has a history of pulmonary embolism in January 2016. He subsequently took 6 months of anticoagulation and then discontinued. Recently, he presented to the emergency room with pleuritic chest pain and shortness of breath and was found to have recurrent PE. He is now back on Xarelto and tolerating it well. He currently feels well and is asymptomatic. He has no neurologic complaints. He denies any recent fevers. He has a good appetite and denies weight loss. He has no further chest pain or shortness of breath. He denies any nausea, vomiting, constipation, or diarrhea. He has no urinary complaints.  REVIEW OF SYSTEMS:   Review of Systems  Constitutional: Negative for fever, weight loss and malaise/fatigue.  Respiratory: Negative.  Negative for hemoptysis and shortness of breath.   Cardiovascular: Negative.  Negative for chest pain.  Gastrointestinal: Negative.  Negative for blood in stool and melena.  Neurological: Negative.  Negative for weakness.  Endo/Heme/Allergies: Does not bruise/bleed easily.    As per HPI. Otherwise, a complete review of systems is negatve.  PAST MEDICAL HISTORY: Past Medical History  Diagnosis Date  . Essential hypertension   . Anxiety   . Hyperlipidemia with target LDL less than 70   . Pulmonary emboli (HCC)   . 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(HCC)     PAST SURGICAL HISTORY: Past Surgical History  Procedure Laterality Date  . Splenectomy, total      TTP  . Left heart catheterization with coronary angiogram Bilateral 07/27/2014    Procedure: LEFT HEART CATHETERIZATION WITH CORONARY ANGIOGRAM;   Surgeon: Runell Gess, MD;  Location: [facility] CATH LAB;  Service: Cardiovascular;  Laterality: Bilateral;    FAMILY HISTORY Family History  Problem Relation Age of Onset  . CAD Father 21  . CAD Brother   . Kidney failure Mother        ADVANCED DIRECTIVES:    HEALTH MAINTENANCE: Social History  Substance Use Topics  . Smoking status: Former Smoker -- 20 years    Types: Cigarettes  . Smokeless tobacco: Never Used     Comment: Quit tobacco in 1980  . Alcohol Use: No     Comment: quit 30 years ago     Colonoscopy:  PAP:  Bone density:  Lipid panel:  No Known Allergies  Current Outpatient Prescriptions  Medication Sig Dispense Refill  . albuterol (PROVENTIL HFA;VENTOLIN HFA) 108 (90 BASE) MCG/ACT inhaler Inhale 2 puffs into the lungs every 6 (six) hours as needed for wheezing or shortness of breath. 1 Inhaler 0  . amLODipine (NORVASC) 5 MG tablet Take 1 tablet (5 mg total) by mouth daily. 90 tablet 3  . atorvastatin (LIPITOR) 40 MG tablet Take 1 tablet (40 mg total) by mouth daily at 6 PM. 90 tablet 3  . cholecalciferol (VITAMIN D) 1000 UNITS tablet Take 1,000 Units by mouth daily.    Marland Kitchen losartan (COZAAR) 100 MG tablet Take 1 tablet daily for BP 90 tablet 3  . Melatonin 5 MG TABS Take 10 mg by mouth at bedtime. 3 tabs    . metoprolol succinate (TOPROL-XL) 25 MG 24 hr tablet Take 1 tablet (25 mg  total) by mouth daily. 90 tablet 3  . omeprazole (PRILOSEC) 40 MG capsule Take 1 capsule (40 mg total) by mouth daily. 90 capsule 3  . Rivaroxaban (XARELTO STARTER PACK) 15 & 20 MG TBPK Take as directed on package: Start with one 15mg  tablet by mouth twice a day with food. On Day 22, switch to one 20mg  tablet once a day with food. 51 each 0  . sertraline (ZOLOFT) 50 MG tablet Take 1 tablet each day. 30 tablet 6  . tamsulosin (FLOMAX) 0.4 MG CAPS capsule Take 1 capsule (0.4 mg total) by mouth daily. 30 capsule 0  . vitamin B-12 (CYANOCOBALAMIN) 500 MCG tablet Take 500 mcg by mouth daily.     . vitamin E 1000 UNIT capsule Take 1,000 Units by mouth daily.     No current facility-administered medications for this visit.    OBJECTIVE: Filed Vitals:   08/05/15 1110  BP: 139/87  Pulse: 87  Temp: 96 F (35.6 C)     Body mass index is 30.06 kg/(m^2).    ECOG FS:0 - Asymptomatic  General: Well-developed, well-nourished, no acute distress. Eyes: Pink conjunctiva, anicteric sclera. HEENT: Normocephalic, moist mucous membranes, clear oropharnyx. Lungs: Clear to auscultation bilaterally. Heart: Regular rate and rhythm. No rubs, murmurs, or gallops. Abdomen: Soft, nontender, nondistended. No organomegaly noted, normoactive bowel sounds. Musculoskeletal: No edema, cyanosis, or clubbing. Neuro: Alert, answering all questions appropriately. Cranial nerves grossly intact. Skin: No rashes or petechiae noted. Psych: Normal affect. Lymphatics: No cervical, calvicular, axillary or inguinal LAD.   LAB RESULTS:  Lab Results  Component Value Date   NA 141 07/20/2015   K 3.9 07/20/2015   CL 108 07/20/2015   CO2 25 07/20/2015   GLUCOSE 102* 07/20/2015   BUN 16 07/20/2015   CREATININE 0.80 07/20/2015   CALCIUM 9.5 07/20/2015   PROT 7.1 04/15/2015   ALBUMIN 4.0 04/15/2015   AST 27 04/15/2015   ALT 14 04/15/2015   ALKPHOS 95 04/15/2015   BILITOT 1.6* 04/15/2015   GFRNONAA >60 07/20/2015   GFRAA >60 07/20/2015    Lab Results  Component Value Date   WBC 7.8 07/20/2015   HGB 14.2 07/20/2015   HCT 42.3 07/20/2015   MCV 105.2* 07/20/2015   PLT 214 07/20/2015     STUDIES: Dg Chest 2 View  07/20/2015  CLINICAL DATA:  75 year old male with increasing shortness breath EXAM: CHEST  2 VIEW COMPARISON:  Prior chest x-ray 06/03/2015 FINDINGS: The lungs are clear and negative for focal airspace consolidation, pulmonary edema or suspicious pulmonary nodule. No pleural effusion or pneumothorax. Cardiac and mediastinal contours are within normal limits. Atherosclerotic calcifications  noted in the transverse aorta. No acute fracture or lytic or blastic osseous lesions. The visualized upper abdominal bowel gas pattern is unremarkable. Surgical clips in the left upper quadrant. IMPRESSION: No active cardiopulmonary disease. Electronically Signed   By: Malachy Moan M.D.   On: 07/20/2015 15:59   Ct Angio Chest Pe W/cm &/or Wo Cm  07/20/2015  CLINICAL DATA:  Shortness of breath on exertion EXAM: CT ANGIOGRAPHY CHEST WITH CONTRAST TECHNIQUE: Multidetector CT imaging of the chest was performed using the standard protocol during bolus administration of intravenous contrast. Multiplanar CT image reconstructions and MIPs were obtained to evaluate the vascular anatomy. CONTRAST:  OMNIPAQUE IOHEXOL 350 MG/ML SOLN COMPARISON:  10/28/2014, 07/28/2014 FINDINGS: The lungs are well aerated bilaterally. No focal infiltrate or sizable effusion is seen. No pulmonary nodule is noted. The thoracic inlet is within normal limits. Aortic  calcifications are seen without aneurysmal dilatation or dissection. Pulmonary artery is well visualized and demonstrates a normal branching pattern. Filling defects are seen within the lower lobe pulmonary arterial branch particularly on the right but to a lesser degree on the left. Right ventricle is mildly prominent but stable from the prior exam. Coronary calcifications are again noted. No hilar or mediastinal adenopathy is seen. The visualized upper abdomen is stable in appearance. No bony abnormality is seen. Review of the MIP images confirms the above findings. IMPRESSION: Evidence of pulmonary emboli involving predominately the lower lobe branches on the right. No other focal abnormality is noted. Critical Value/emergent results were called by telephone at the time of interpretation on 07/20/2015 at 4:57 pm to Dr. York CeriseForbach, who verbally acknowledged these results. Electronically Signed   By: Alcide CleverMark  Lukens M.D.   On: 07/20/2015 16:59    ASSESSMENT: Recurrent  pulmonary embolism.  PLAN:    1. Recurrent PE: CT results reviewed independently and reported as above. Given the recurrence of his pulmonary embolism, patient will require lifelong anticoagulation. Continue Xarelto as prescribed which is given to him by his primary care physician. Patient does not have a family history of clotting disorder, but will do a full hypercoagulable workup for completeness. No follow-up is necessary. Will call patient with results of his workup in 1-2 weeks. 2. 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  Patient expressed understanding and was in agreement with this plan. He also understands that He can call clinic at any time with any questions, concerns, or complaints.    Jeralyn Ruthsimothy J Finnegan, MD   08/06/2015 1:08 PM

## 2015-08-10 ENCOUNTER — Encounter: Payer: Self-pay | Admitting: Family Medicine

## 2015-08-10 ENCOUNTER — Ambulatory Visit (INDEPENDENT_AMBULATORY_CARE_PROVIDER_SITE_OTHER): Payer: Medicare Other | Admitting: Family Medicine

## 2015-08-10 VITALS — BP 120/74 | HR 69 | Resp 16 | Ht 75.0 in | Wt 242.0 lb

## 2015-08-10 DIAGNOSIS — E785 Hyperlipidemia, unspecified: Secondary | ICD-10-CM | POA: Diagnosis not present

## 2015-08-10 DIAGNOSIS — I10 Essential (primary) hypertension: Secondary | ICD-10-CM | POA: Diagnosis not present

## 2015-08-10 DIAGNOSIS — I82403 Acute embolism and thrombosis of unspecified deep veins of lower extremity, bilateral: Secondary | ICD-10-CM

## 2015-08-10 DIAGNOSIS — I2699 Other pulmonary embolism without acute cor pulmonale: Secondary | ICD-10-CM

## 2015-08-10 LAB — BETA-2-GLYCOPROTEIN I ABS, IGG/M/A
Beta-2 Glyco I IgG: <9 GPI IgG units (ref 0–20)
Beta-2-Glycoprotein I IgA: <9 GPI IgA units (ref 0–25)
Beta-2-Glycoprotein I IgM: <9 GPI IgM units (ref 0–32)

## 2015-08-10 LAB — PROTEIN S ACTIVITY: Protein S Activity: >259 % — ABNORMAL HIGH (ref 63–140)

## 2015-08-10 LAB — CARDIOLIPIN ANTIBODIES, IGG, IGM, IGA: Anticardiolipin IgA: <9 APL U/mL (ref 0–11)

## 2015-08-10 LAB — PROTHROMBIN GENE MUTATION

## 2015-08-10 LAB — DRVVT CONFIRM: DRVVT CONFIRM: 0.9 ratio (ref 0.8–1.2)

## 2015-08-10 LAB — FACTOR 5 LEIDEN

## 2015-08-10 LAB — DRVVT MIX: DRVVT MIX: 64 s — AB (ref 0.0–44.0)

## 2015-08-10 LAB — LUPUS ANTICOAGULANT PANEL
DRVVT: 73.1 s — ABNORMAL HIGH (ref 0.0–44.0)
PTT LA: 35.2 s (ref 0.0–40.6)

## 2015-08-10 LAB — PROTEIN C ACTIVITY: Protein C Activity: 189 % — ABNORMAL HIGH (ref 73–180)

## 2015-08-10 LAB — PROTEIN C, TOTAL: PROTEIN C, TOTAL: 131 % (ref 60–150)

## 2015-08-10 LAB — PROTEIN S, TOTAL: Protein S Ag, Total: 139 % (ref 60–150)

## 2015-08-10 LAB — HOMOCYSTEINE: HOMOCYSTEINE-NORM: 23.8 umol/L — AB (ref 0.0–15.0)

## 2015-08-10 NOTE — Progress Notes (Signed)
Name: Craig [name]   MRN: [id number]    DOB: April 25, 1941   Date:08/10/2015       Progress Note  Subjective  Chief Complaint  Chief Complaint  Patient presents with  . Hypertension    HPI Here for f/u of PE.  Placed on  Xeralto and now on 20 mg/d.  He has seen Dr. Grayland Ormond, Hematology.  Lots of labs were drawn, but I am unaware of why tests were done or what he was looking for.  He is feeling better and breathing better.   He has not gotten an appointment for f/u with Hematology. No problem-specific assessment & plan notes found for this encounter.   Past Medical History  Diagnosis Date  . Essential hypertension   . Anxiety   . Hyperlipidemia with target LDL less than 70   . Pulmonary emboli ([place])   . 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 Splenectomy, total      TTP  . Left heart catheterization with coronary angiogram Bilateral 07/27/2014    Procedure: LEFT HEART CATHETERIZATION WITH CORONARY ANGIOGRAM;  Surgeon: Lorretta Harp, MD;  Location: [facility] CATH LAB;  Service: Cardiovascular;  Laterality: Bilateral;    Family History  Problem Relation Age of Onset  . CAD Father 70  . CAD Brother   . Kidney failure Mother     Social History   Social History  . Marital Status: Single    Spouse Name: N/A  . Number of Children: N/A  . Years of Education: N/A   Occupational History  . Not on file.   Social History Main Topics  . Smoking status: Former Smoker -- 20 years    Types: Cigarettes  . Smokeless tobacco: Never Used     Comment: Quit tobacco in 1980  . Alcohol Use: No     Comment: quit 30 years ago  . Drug Use: Yes  . Sexual Activity: Yes    Birth Control/ Protection: None   Other Topics Concern  . Not on file   Social History Narrative   Retired from [employer]. Retired x 20 years.  Two children.       Current outpatient prescriptions:  .  albuterol (PROVENTIL HFA;VENTOLIN HFA) 108 (90 BASE) MCG/ACT  inhaler, Inhale 2 puffs into the lungs every 6 (six) hours as needed for wheezing or shortness of breath., Disp: 1 Inhaler, Rfl: 0 .  amLODipine (NORVASC) 5 MG tablet, Take 1 tablet (5 mg total) by mouth daily., Disp: 90 tablet, Rfl: 3 .  atorvastatin (LIPITOR) 40 MG tablet, Take 1 tablet (40 mg total) by mouth daily at 6 PM., Disp: 90 tablet, Rfl: 3 .  cholecalciferol (VITAMIN D) 1000 UNITS tablet, Take 1,000 Units by mouth daily., Disp: , Rfl:  .  losartan (COZAAR) 100 MG tablet, Take 1 tablet daily for BP, Disp: 90 tablet, Rfl: 3 .  Melatonin 5 MG TABS, Take 10 mg by mouth at bedtime. 3 tabs, Disp: , Rfl:  .  metoprolol succinate (TOPROL-XL) 25 MG 24 hr tablet, Take 1 tablet (25 mg total) by mouth daily., Disp: 90 tablet, Rfl: 3 .  omeprazole (PRILOSEC) 40 MG capsule, Take 1 capsule (40 mg total) by mouth daily., Disp: 90 capsule, Rfl: 3 .  Rivaroxaban (XARELTO STARTER PACK) 15 & 20 MG TBPK, Take as directed on package: Start with one 65m tablet by mouth twice a day with food. On Day 22, switch to one 211m  tablet once a day with food., Disp: 51 each, Rfl: 0 .  sertraline (ZOLOFT) 50 MG tablet, Take 1 tablet each day., Disp: 30 tablet, Rfl: 6 .  tamsulosin (FLOMAX) 0.4 MG CAPS capsule, Take 1 capsule (0.4 mg total) by mouth daily., Disp: 30 capsule, Rfl: 0 .  vitamin B-12 (CYANOCOBALAMIN) 500 MCG tablet, Take 500 mcg by mouth daily., Disp: , Rfl:  .  vitamin E 1000 UNIT capsule, Take 1,000 Units by mouth daily., Disp: , Rfl:   Not on File   Review of Systems  Constitutional: Negative for fever, chills, weight loss and malaise/fatigue.  HENT: Negative for hearing loss.   Eyes: Negative for blurred vision and double vision.  Respiratory: Negative for cough, shortness of breath and wheezing.   Cardiovascular: Negative for chest pain, palpitations and leg swelling.  Gastrointestinal: Negative for heartburn and abdominal pain.  Genitourinary: Negative for dysuria, urgency and frequency.  Skin:  Negative for rash.  Neurological: Negative for dizziness, tremors, weakness and headaches.      Objective  Filed Vitals:   08/10/15 1012  BP: 120/74  Pulse: 69  Resp: 16  Height: 6' 3" (1.905 m)  Weight: 242 lb (109.77 kg)    Physical Exam  Constitutional: He is oriented to person, place, and time and well-developed, well-nourished, and in no distress. No distress.  HENT:  Head: Normocephalic and atraumatic.  Eyes: Conjunctivae and EOM are normal. Pupils are equal, round, and reactive to light. No scleral icterus.  Neck: Normal range of motion. Neck supple. Carotid bruit is not present. No thyromegaly present.  Cardiovascular: Normal rate, regular rhythm and normal heart sounds.  Exam reveals no gallop and no friction rub.   No murmur heard. Pulmonary/Chest: Effort normal and breath sounds normal. No respiratory distress. He has no wheezes. He has no rales.  Abdominal: Soft. Bowel sounds are normal. He exhibits no distension, no abdominal bruit and no mass. There is no tenderness.  Musculoskeletal: He exhibits edema (trace bilateral pedal edema.).  Lymphadenopathy:    He has no cervical adenopathy.  Neurological: He is alert and oriented to person, place, and time.  Vitals reviewed.      Recent Results (from the past 2160 hour(s))  CBC     Status: Abnormal   Collection Time: 07/20/15  3:20 PM  Result Value Ref Range   WBC 7.8 3.8 - 10.6 K/uL   RBC 4.02 (L) 4.40 - 5.90 MIL/uL   Hemoglobin 14.2 13.0 - 18.0 g/dL   HCT 42.3 40.0 - 52.0 %   MCV 105.2 (H) 80.0 - 100.0 fL   MCH 35.3 (H) 26.0 - 34.0 pg   MCHC 33.6 32.0 - 36.0 g/dL   RDW 14.6 (H) 11.5 - 14.5 %   Platelets 214 150 - 440 K/uL  Basic metabolic panel     Status: Abnormal   Collection Time: 07/20/15  3:20 PM  Result Value Ref Range   Sodium 141 135 - 145 mmol/L   Potassium 3.9 3.5 - 5.1 mmol/L   Chloride 108 101 - 111 mmol/L   CO2 25 22 - 32 mmol/L   Glucose, Bld 102 (H) 65 - 99 mg/dL   BUN 16 6 - 20 mg/dL    Creatinine, Ser 0.80 0.61 - 1.24 mg/dL   Calcium 9.5 8.9 - 10.3 mg/dL   GFR calc non Af Amer >60 >60 mL/min   GFR calc Af Amer >60 >60 mL/min    Comment: (NOTE) The eGFR has been calculated using the CKD  EPI equation. This calculation has not been validated in all clinical situations. eGFR's persistently <60 mL/min signify possible Chronic Kidney Disease.    Anion gap 8 5 - 15  Protime-INR     Status: None   Collection Time: 07/20/15  3:20 PM  Result Value Ref Range   Prothrombin Time 12.6 11.4 - 15.0 seconds   INR 0.92   APTT     Status: None   Collection Time: 07/20/15  3:20 PM  Result Value Ref Range   aPTT 24 24 - 36 seconds  Troponin I     Status: None   Collection Time: 07/20/15  3:20 PM  Result Value Ref Range   Troponin I <0.03 <0.031 ng/mL    Comment:        NO INDICATION OF MYOCARDIAL INJURY.   Lipid Profile     Status: Abnormal   Collection Time: 07/21/15 11:41 AM  Result Value Ref Range   Cholesterol, Total 160 100 - 199 mg/dL   Triglycerides 390 (H) 0 - 149 mg/dL   HDL 102 >39 mg/dL   VLDL Cholesterol Cal 78 (H) 5 - 40 mg/dL   LDL Calculated Comment (A) 0 - 99 mg/dL    Comment: Negative value obtained on calculation. Call laboratory to verify component results.    Chol/HDL Ratio 1.6 0.0 - 5.0 ratio units    Comment:                                   T. Chol/HDL Ratio                                             Men  Women                               1/2 Avg.Risk  3.4    3.3                                   Avg.Risk  5.0    4.4                                2X Avg.Risk  9.6    7.1                                3X Avg.Risk 23.4   11.0   TSH     Status: None   Collection Time: 07/21/15 11:41 AM  Result Value Ref Range   TSH 1.170 0.450 - 4.500 uIU/mL  Cardiolipin antibodies, IgG, IgM, IgA     Status: None   Collection Time: 08/05/15 12:34 PM  Result Value Ref Range   Anticardiolipin IgG <9 0 - 14 GPL U/mL    Comment: (NOTE)                           Negative:              <15  Indeterminate:     15 - 20                          Low-Med Positive: >20 - 80                          High Positive:         >80    Anticardiolipin IgM <9 0 - 12 MPL U/mL    Comment: (NOTE)                          Negative:              <13                          Indeterminate:     13 - 20                          Low-Med Positive: >20 - 80                          High Positive:         >80    Anticardiolipin IgA <9 0 - 11 APL U/mL    Comment: (NOTE)                          Negative:              <12                          Indeterminate:     12 - 20                          Low-Med Positive: >20 - 80                          High Positive:         >80 Performed At: [facility] [place], [place] [id number] Lindon Romp MD FB:[id number]   Lupus anticoagulant panel     Status: Abnormal   Collection Time: 08/05/15 12:34 PM  Result Value Ref Range   PTT Lupus Anticoagulant 35.2 0.0 - 40.6 sec   DRVVT 73.1 (H) 0.0 - 44.0 sec   Lupus Anticoag Interp Comment:     Comment: (NOTE) No lupus anticoagulant was detected. These results are consistent with specific inhibitors to one or more common pathway factors (X, V, II or fibrinogen). Performed At: [facility] [place], [place] [id number] Lindon Romp MD [id number]   Protein C activity     Status: Abnormal   Collection Time: 08/05/15 12:34 PM  Result Value Ref Range   Protein C Activity 189 (H) 73 - 180 %    Comment: (NOTE) Performed At: [facility] [address] [place], [place] [id number] Lindon Romp MD [id number]   Protein C, total     Status: None   Collection Time: 08/05/15 12:34 PM  Result Value Ref Range   Protein C, Total 131 60 - 150 %    Comment: (NOTE) Performed At: [facility] [address] [place], [place] [id number] Darrel Hoover  F MD AY:[id number]   Protein S  activity     Status: Abnormal   Collection Time: 08/05/15 12:34 PM  Result Value Ref Range   Protein S Activity >259 (H) 63 - 140 %    Comment: (NOTE) An elevated protein S activity is of no known clinical significance. Protein S activity may be falsely increased (masking an abnormal, low result) in patients receiving direct Xa inhibitor (e.g., rivaroxaban, apixaban, edoxaban) or a direct thrombin inhibitor (e.g., dabigatran) anticoagulant treatment due to assay interference by these drugs. Performed At: [facility] [place], [place] [id number] Lindon Romp MD RK:[id number]   Protein S, total     Status: None   Collection Time: 08/05/15 12:34 PM  Result Value Ref Range   Protein S Ag, Total 139 60 - 150 %    Comment: (NOTE) Performed At: [facility] [address] [place], [place] [id number] Lindon Romp MD VP:[id number]   Antithrombin III     Status: None   Collection Time: 08/05/15 12:34 PM  Result Value Ref Range   AntiThromb III Func 92 75 - 120 %    Comment: Performed at [facility]  Factor 5 leiden     Status: None   Collection Time: 08/05/15 12:34 PM  Result Value Ref Range   Recommendations-F5LEID: Comment     Comment: (NOTE) Result:  Negative (no mutation found) Factor V Leiden is a specific mutation (R506Q) in the factor V gene that is associated with an increased risk of venous thrombosis. Factor V Leiden is more resistant to inactivation by activated protein C.  As a result, factor V persists in the circulation leading to a mild hyper- coagulable state.  The Leiden mutation accounts for 90% - 95% of APC resistance.  Factor V Leiden has been reported in patients with deep vein thrombosis, pulmonary embolus, central retinal vein occlusion, cerebral sinus thrombosis and hepatic vein thrombosis. Other risk factors to be considered in the workup for venous thrombosis include the G20210A mutation in the factor II  (prothrombin) gene, protein S and C deficiency, and antithrombin deficiencies. Anticardiolipin antibody and lupus anticoagulant analysis may be appropriate for certain patients, as well as homocysteine levels. Contact your local [facility] for information on how to order additi onal testing if desired.    Comment Comment     Comment: (NOTE) **Genetic counselors are available for health care providers to**  discuss results at [phone] [phone]). Methodology: DNA analysis of the Factor V gene was performed by allele-specific PCR. The diagnostic sensitivity and specificity is >99% for both. Molecular-based testing is highly accurate, but as in any laboratory test, diagnostic errors may occur. All test results must be combined with clinical information for the most accurate interpretation. References: Voelkerding K (1996).  Clin Lab Med [phone]. Allison Quarry, PhD, [facility] Ruben Reason, PhD, [facility] Jens Som, PhD, [facility] Annetta Maw, M.S., PhD, [facility] Alfredo Bach, PhD, [facility] Norva Riffle, PhD, [facility] Earlean Polka PhD, [facility] Performed At: [facility] [place], [place] [id number] Nechama Guard MD RC:[id number]   Prothrombin gene mutation (Factor 2 Mutation)     Status: None   Collection Time: 08/05/15 12:34 PM  Result Value Ref Range   Recommendations-PTGENE: Comment     Comment: (NOTE) NEGATIVE No mutation identified. Comment: A point mutation (G20210A) in the factor II (prothrombin) gene is the second most common cause of inherited thrombophilia. The incidence of this mutation in the [place] Caucasian population  is about 2% and in the [place] American population it is approximately 0.5%. This mutation is rare in the [place] and Native American population. Being heterozygous for a prothrombin mutation increases the risk for developing venous thrombosis about 2 to 3 times above the general population risk. Being homozygous for the  prothrombin gene mutation increases the relative risk for venous thrombosis further, although it is not yet known how much further the risk is increased. In women heterozygous for the prothrombin gene mutation, the use of estrogen containing oral contraceptives increases the relative risk of venous thrombosis about 16 times and the risk of developing cerebral thrombosis is also significantly increased. In pregnancy the pr othrombin gene mutation increases risk for venous thrombosis and may increase risk for stillbirth, placental abruption, pre-eclampsia and fetal growth restriction. If the patient possesses two or more congenital or acquired thrombophilic risk factors, the risk for thrombosis may rise to more than the sum of the risk ratios for the individual mutations. This assay detects only the prothrombin G20210A mutation and does not measure genetic abnormalities elsewhere in the genome. Other thrombotic risk factors may be pursued through systematic clinical laboratory analysis. These factors include the R506Q (Leiden) mutation in the Factor V gene, plasma homocysteine levels, as well as testing for deficiencies of antithrombin III, protein C and protein S.    Additional Information Comment     Comment: (NOTE) Genetic Counselors are available for health care providers to discuss results at [phone] [phone]). Methodology: DNA analysis of the Factor II gene was performed by PCR amplification followed by restriction analysis. The diagnostic sensitivity is >99% for both. All the tests must be combined with clinical information for the most accurate interpretation. Molecular-based testing is highly accurate, but as in any laboratory test, diagnostic errors may occur. Poort SR, et al. Blood. 1996; 63:8466-5993. Varga EA. Circulation. 2004; 570:V77-L39. Mervin Hack, et [place]; 19:700-703. Allison Quarry, PhD, [facility] Ruben Reason, PhD,  [facility] Jens Som, PhD, [facility] Annetta Maw, M.S., PhD, [facility] Alfredo Bach, PhD, [facility] Norva Riffle, PhD, [facility] Earlean Polka, PhD, [facility] Performed At: [facility] [address] [place], [place] [id number] Nechama Guard MD [id number]   Homocysteine     Status: Abnormal   Collection Time: 08/05/15 12:34 PM  Result Value Ref Range   Homocysteine 23.8 (H) 0.0 - 15.0 umol/L    Comment: (NOTE) Performed At: [facility] [place], [place] [id number] Lindon Romp MD DS:[id number]   Beta-2-glycoprotein i abs, IgG/M/A     Status: None   Collection Time: 08/05/15 12:34 PM  Result Value Ref Range   Beta-2 Glyco I IgG <9 0 - 20 GPI IgG units    Comment: (NOTE) The reference interval reflects a 3SD or 99th percentile interval, which is thought to represent a potentially clinically significant result in accordance with the International Consensus Statement on the classification criteria for definitive antiphospholipid syndrome (APS). J Thromb Haem 2006;4:295-306.    Beta-2-Glycoprotein I IgM <9 0 - 32 GPI IgM units    Comment: (NOTE) The reference interval reflects a 3SD or 99th percentile interval, which is thought to represent a potentially clinically significant result in accordance with the International Consensus Statement on the classification criteria for definitive antiphospholipid syndrome (APS). J Thromb Haem 2006;4:295-306. Performed At: [facility] [place], [place] [id number] Lindon Romp MD [id number]    Beta-2-Glycoprotein I IgA <9 0 - 25 GPI IgA units  Comment: (NOTE) The reference interval reflects a 3SD or 99th percentile interval, which is thought to represent a potentially clinically significant result in accordance with the International Consensus Statement on the classification criteria for definitive antiphospholipid syndrome (APS). J Thromb Haem 2006;4:295-306.   dRVVT Mix      Status: Abnormal   Collection Time: 08/05/15 12:34 PM  Result Value Ref Range   dRVVT Mix 64.0 (H) 0.0 - 44.0 sec    Comment: (NOTE) Performed At: [facility] [place], [place] [id number] Lindon Romp MD JS:[id number]   dRVVT Confirm     Status: None   Collection Time: 08/05/15 12:34 PM  Result Value Ref Range   dRVVT Confirm 0.9 0.8 - 1.2 ratio    Comment: (NOTE) Performed At: [facility] [place], [place] [id number] Lindon Romp MD [id number]      Assessment & Plan  Problem List Items Addressed This Visit      Cardiovascular and Mediastinum   Pulmonary embolus with infarction - Bilateral   DVT of lower extremity, bilateral ([place])   Essential hypertension - Primary (Chronic)     Other   HLD (hyperlipidemia)     1. Essential hypertension Cont. meds  2. Pulmonary embolism, other ([place]) Cont. xeralto  3. Deep vein thrombosis (DVT) of both lower extremities, unspecified chronicity, unspecified vein (HCC)   4. HLD (hyperlipidemia) Cont. meds  No orders of the defined types were placed in this encounter.

## 2015-08-20 ENCOUNTER — Other Ambulatory Visit: Payer: Self-pay | Admitting: *Deleted

## 2015-08-20 MED ORDER — RIVAROXABAN 20 MG PO TABS: 20.0000 mg | ORAL_TABLET | Freq: Every day | ORAL | Status: DC

## 2015-08-23 ENCOUNTER — Telehealth: Payer: Self-pay | Admitting: *Deleted

## 2015-08-23 NOTE — Telephone Encounter (Signed)
Request that Dr Orlie Dakin call Dr Jonna Clark to explain his lab results to him. He saw him Friday and told pt that he needs [place] to call him to explain results

## 2015-08-23 NOTE — Telephone Encounter (Addendum)
Per Dr Orlie Dakin, nothing concerning. I called Dr Juanetta Gosling office and they said he was told to call us for results since we were the ones that ordered it. I then calle dpatient and told him that he is not concerned about any of the results

## 2015-09-02 ENCOUNTER — Ambulatory Visit: Payer: Medicare PPO | Admitting: Family Medicine

## 2015-09-10 ENCOUNTER — Other Ambulatory Visit: Payer: Self-pay | Admitting: Family Medicine

## 2015-09-10 NOTE — Telephone Encounter (Signed)
Called [facility] to find out if they had rx. They do have rx but PA is required. PA has been submitted and patient notified.

## 2015-09-10 NOTE — Telephone Encounter (Signed)
Pt called states that his xarelto need to be a 90 day supply

## 2015-09-15 ENCOUNTER — Telehealth: Payer: Self-pay | Admitting: *Deleted

## 2015-09-15 NOTE — Telephone Encounter (Signed)
Patient required PA for Xarelto 20 mg. [id number] Approved through 07/23/16 under Medicare Part D.

## 2015-09-23 ENCOUNTER — Encounter: Payer: Self-pay | Admitting: Family Medicine

## 2015-09-23 ENCOUNTER — Ambulatory Visit (INDEPENDENT_AMBULATORY_CARE_PROVIDER_SITE_OTHER): Payer: Medicare Other | Admitting: Family Medicine

## 2015-09-23 VITALS — BP 130/70 | HR 68 | Temp 98.3°F | Resp 16 | Wt 244.0 lb

## 2015-09-23 DIAGNOSIS — I82493 Acute embolism and thrombosis of other specified deep vein of lower extremity, bilateral: Secondary | ICD-10-CM

## 2015-09-23 DIAGNOSIS — N200 Calculus of kidney: Secondary | ICD-10-CM

## 2015-09-23 DIAGNOSIS — Z9081 Acquired absence of spleen: Secondary | ICD-10-CM

## 2015-09-23 DIAGNOSIS — E785 Hyperlipidemia, unspecified: Secondary | ICD-10-CM

## 2015-09-23 DIAGNOSIS — I2699 Other pulmonary embolism without acute cor pulmonale: Secondary | ICD-10-CM | POA: Diagnosis not present

## 2015-09-23 DIAGNOSIS — I10 Essential (primary) hypertension: Secondary | ICD-10-CM | POA: Diagnosis not present

## 2015-09-23 NOTE — Progress Notes (Signed)
Name: Craig [name]   MRN: [id number]    DOB: Sep 03, 1940   Date:09/23/2015       Progress Note  Subjective  Chief Complaint  Chief Complaint  Patient presents with  . Follow-up    Medications, BP    HPI Here for f/u of HBP.  Just passed a kidney stone yesterday.  He brought it in to be seen.  He asks for a refill of Vicodin to have if needed for another kidney stone.  No problem-specific assessment & plan notes found for this encounter.   Past Medical History  Diagnosis Date  . Essential hypertension   . Anxiety   . Hyperlipidemia with target LDL less than 70   . Pulmonary emboli ([place])   . 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 Splenectomy, total      TTP  . Left heart catheterization with coronary angiogram Bilateral 07/27/2014    Procedure: LEFT HEART CATHETERIZATION WITH CORONARY ANGIOGRAM;  Surgeon: Lorretta Harp, MD;  Location: [facility] CATH LAB;  Service: Cardiovascular;  Laterality: Bilateral;    Family History  Problem Relation Age of Onset  . CAD Father 28  . CAD Brother   . Kidney failure Mother     Social History   Social History  . Marital Status: Single    Spouse Name: N/A  . Number of Children: N/A  . Years of Education: N/A   Occupational History  . Not on file.   Social History Main Topics  . Smoking status: Former Smoker -- 20 years    Types: Cigarettes  . Smokeless tobacco: Never Used     Comment: Quit tobacco in 1980  . Alcohol Use: No     Comment: quit 30 years ago  . Drug Use: Yes  . Sexual Activity: Yes    Birth Control/ Protection: None   Other Topics Concern  . Not on file   Social History Narrative   Retired from [employer]. Retired x 20 years.  Two children.       Current outpatient prescriptions:  .  amLODipine (NORVASC) 5 MG tablet, Take 1 tablet (5 mg total) by mouth daily., Disp: 90 tablet, Rfl: 3 .  atorvastatin (LIPITOR) 40 MG tablet, Take 1 tablet (40 mg total) by mouth  daily at 6 PM., Disp: 90 tablet, Rfl: 3 .  cholecalciferol (VITAMIN D) 1000 UNITS tablet, Take 1,000 Units by mouth daily., Disp: , Rfl:  .  losartan (COZAAR) 100 MG tablet, Take 1 tablet daily for BP, Disp: 90 tablet, Rfl: 3 .  Melatonin 5 MG TABS, Take 10 mg by mouth at bedtime. 3 tabs, Disp: , Rfl:  .  metoprolol succinate (TOPROL-XL) 25 MG 24 hr tablet, Take 1 tablet (25 mg total) by mouth daily., Disp: 90 tablet, Rfl: 3 .  omeprazole (PRILOSEC) 40 MG capsule, Take 1 capsule (40 mg total) by mouth daily., Disp: 90 capsule, Rfl: 3 .  rivaroxaban (XARELTO) 20 MG TABS tablet, Take 1 tablet (20 mg total) by mouth daily with supper., Disp: 90 tablet, Rfl: 3 .  sertraline (ZOLOFT) 50 MG tablet, Take 1 tablet each day., Disp: 30 tablet, Rfl: 6 .  tamsulosin (FLOMAX) 0.4 MG CAPS capsule, Take 1 capsule (0.4 mg total) by mouth daily., Disp: 30 capsule, Rfl: 0 .  vitamin B-12 (CYANOCOBALAMIN) 500 MCG tablet, Take 500 mcg by mouth daily., Disp: , Rfl:  .  vitamin E 1000 UNIT capsule, Take 1,000  Units by mouth daily., Disp: , Rfl:  .  albuterol (PROVENTIL HFA;VENTOLIN HFA) 108 (90 BASE) MCG/ACT inhaler, Inhale 2 puffs into the lungs every 6 (six) hours as needed for wheezing or shortness of breath. (Patient not taking: Reported on 09/23/2015), Disp: 1 Inhaler, Rfl: 0  No Known Allergies   Review of Systems  Constitutional: Negative for fever, chills, weight loss and malaise/fatigue.  HENT: Negative for hearing loss.   Eyes: Negative for blurred vision and double vision.  Respiratory: Negative for cough, shortness of breath and wheezing.   Cardiovascular: Negative for chest pain, palpitations and leg swelling.  Gastrointestinal: Negative for heartburn, abdominal pain and blood in stool.  Genitourinary: Negative for dysuria, urgency and frequency.       Passed kidney stone yesterday.  Has no dysuria, or bloody urine now.  Skin: Negative for rash.  Neurological: Negative for tremors, weakness and  headaches.      Objective  Filed Vitals:   09/23/15 1116 09/23/15 1153  BP: 126/74 130/70  Pulse: 68   Temp: 98.3 F (36.8 C)   TempSrc: Oral   Resp: 16   Weight: 244 lb (110.678 kg)     Physical Exam  Constitutional: He is oriented to person, place, and time and well-developed, well-nourished, and in no distress. No distress.  HENT:  Head: Normocephalic and atraumatic.  Eyes: Conjunctivae and EOM are normal. Pupils are equal, round, and reactive to light. No scleral icterus.  Neck: Normal range of motion. Neck supple. Carotid bruit is not present. No thyromegaly present.  Cardiovascular: Normal rate, regular rhythm and normal heart sounds.  Exam reveals no gallop and no friction rub.   No murmur heard. Pulmonary/Chest: Effort normal and breath sounds normal. No respiratory distress. He has no wheezes. He has no rales.  Abdominal: Soft. Bowel sounds are normal. He exhibits no distension and no mass. There is no tenderness.  Musculoskeletal: He exhibits no edema.  Lymphadenopathy:    He has no cervical adenopathy.  Neurological: He is alert and oriented to person, place, and time.  Vitals reviewed.      Recent Results (from the past 2160 hour(s))  CBC     Status: Abnormal   Collection Time: 07/20/15  3:20 PM  Result Value Ref Range   WBC 7.8 3.8 - 10.6 K/uL   RBC 4.02 (L) 4.40 - 5.90 MIL/uL   Hemoglobin 14.2 13.0 - 18.0 g/dL   HCT 42.3 40.0 - 52.0 %   MCV 105.2 (H) 80.0 - 100.0 fL   MCH 35.3 (H) 26.0 - 34.0 pg   MCHC 33.6 32.0 - 36.0 g/dL   RDW 14.6 (H) 11.5 - 14.5 %   Platelets 214 150 - 440 K/uL  Basic metabolic panel     Status: Abnormal   Collection Time: 07/20/15  3:20 PM  Result Value Ref Range   Sodium 141 135 - 145 mmol/L   Potassium 3.9 3.5 - 5.1 mmol/L   Chloride 108 101 - 111 mmol/L   CO2 25 22 - 32 mmol/L   Glucose, Bld 102 (H) 65 - 99 mg/dL   BUN 16 6 - 20 mg/dL   Creatinine, Ser 0.80 0.61 - 1.24 mg/dL   Calcium 9.5 8.9 - 10.3 mg/dL   GFR calc  non Af Amer >60 >60 mL/min   GFR calc Af Amer >60 >60 mL/min    Comment: (NOTE) The eGFR has been calculated using the CKD EPI equation. This calculation has not been validated in all clinical situations.  eGFR's persistently <60 mL/min signify possible Chronic Kidney Disease.    Anion gap 8 5 - 15  Protime-INR     Status: None   Collection Time: 07/20/15  3:20 PM  Result Value Ref Range   Prothrombin Time 12.6 11.4 - 15.0 seconds   INR 0.92   APTT     Status: None   Collection Time: 07/20/15  3:20 PM  Result Value Ref Range   aPTT 24 24 - 36 seconds  Troponin I     Status: None   Collection Time: 07/20/15  3:20 PM  Result Value Ref Range   Troponin I <0.03 <0.031 ng/mL    Comment:        NO INDICATION OF MYOCARDIAL INJURY.   Lipid Profile     Status: Abnormal   Collection Time: 07/21/15 11:41 AM  Result Value Ref Range   Cholesterol, Total 160 100 - 199 mg/dL   Triglycerides 390 (H) 0 - 149 mg/dL   HDL 102 >39 mg/dL   VLDL Cholesterol Cal 78 (H) 5 - 40 mg/dL   LDL Calculated Comment (A) 0 - 99 mg/dL    Comment: Negative value obtained on calculation. Call laboratory to verify component results.    Chol/HDL Ratio 1.6 0.0 - 5.0 ratio units    Comment:                                   T. Chol/HDL Ratio                                             Men  Women                               1/2 Avg.Risk  3.4    3.3                                   Avg.Risk  5.0    4.4                                2X Avg.Risk  9.6    7.1                                3X Avg.Risk 23.4   11.0   TSH     Status: None   Collection Time: 07/21/15 11:41 AM  Result Value Ref Range   TSH 1.170 0.450 - 4.500 uIU/mL  Cardiolipin antibodies, IgG, IgM, IgA     Status: None   Collection Time: 08/05/15 12:34 PM  Result Value Ref Range   Anticardiolipin IgG <9 0 - 14 GPL U/mL    Comment: (NOTE)                          Negative:              <15                          Indeterminate:  15 - 20                           Low-Med Positive: >20 - 80                          High Positive:         >80    Anticardiolipin IgM <9 0 - 12 MPL U/mL    Comment: (NOTE)                          Negative:              <13                          Indeterminate:     13 - 20                          Low-Med Positive: >20 - 80                          High Positive:         >80    Anticardiolipin IgA <9 0 - 11 APL U/mL    Comment: (NOTE)                          Negative:              <12                          Indeterminate:     12 - 20                          Low-Med Positive: >20 - 80                          High Positive:         >80 Performed At: [facility] [place], [place] [id number] Lindon Romp MD KW:[id number]   Lupus anticoagulant panel     Status: Abnormal   Collection Time: 08/05/15 12:34 PM  Result Value Ref Range   PTT Lupus Anticoagulant 35.2 0.0 - 40.6 sec   DRVVT 73.1 (H) 0.0 - 44.0 sec   Lupus Anticoag Interp Comment:     Comment: (NOTE) No lupus anticoagulant was detected. These results are consistent with specific inhibitors to one or more common pathway factors (X, V, II or fibrinogen). Performed At: [facility] [place], [place] [id number] Lindon Romp MD QQ:[id number]   Protein C activity     Status: Abnormal   Collection Time: 08/05/15 12:34 PM  Result Value Ref Range   Protein C Activity 189 (H) 73 - 180 %    Comment: (NOTE) Performed At: [facility] [address] [place], [place] [id number] Lindon Romp MD GY:[id number]   Protein C, total     Status: None   Collection Time: 08/05/15 12:34 PM  Result Value Ref Range   Protein C, Total 131 60 - 150 %    Comment: (NOTE) Performed At: [facility] [place], [place] [id number] Lindon Romp MD OY:[id number]  Protein S activity     Status: Abnormal   Collection Time: 08/05/15 12:34 PM  Result Value Ref  Range   Protein S Activity >259 (H) 63 - 140 %    Comment: (NOTE) An elevated protein S activity is of no known clinical significance. Protein S activity may be falsely increased (masking an abnormal, low result) in patients receiving direct Xa inhibitor (e.g., rivaroxaban, apixaban, edoxaban) or a direct thrombin inhibitor (e.g., dabigatran) anticoagulant treatment due to assay interference by these drugs. Performed At: [facility] [place], [place] [id number] Lindon Romp MD JK:[id number]   Protein S, total     Status: None   Collection Time: 08/05/15 12:34 PM  Result Value Ref Range   Protein S Ag, Total 139 60 - 150 %    Comment: (NOTE) Performed At: [facility] [address] [place], [place] [id number] Lindon Romp MD YB:[id number]   Antithrombin III     Status: None   Collection Time: 08/05/15 12:34 PM  Result Value Ref Range   AntiThromb III Func 92 75 - 120 %    Comment: Performed at [facility]  Factor 5 leiden     Status: None   Collection Time: 08/05/15 12:34 PM  Result Value Ref Range   Recommendations-F5LEID: Comment     Comment: (NOTE) Result:  Negative (no mutation found) Factor V Leiden is a specific mutation (R506Q) in the factor V gene that is associated with an increased risk of venous thrombosis. Factor V Leiden is more resistant to inactivation by activated protein C.  As a result, factor V persists in the circulation leading to a mild hyper- coagulable state.  The Leiden mutation accounts for 90% - 95% of APC resistance.  Factor V Leiden has been reported in patients with deep vein thrombosis, pulmonary embolus, central retinal vein occlusion, cerebral sinus thrombosis and hepatic vein thrombosis. Other risk factors to be considered in the workup for venous thrombosis include the G20210A mutation in the factor II (prothrombin) gene, protein S and C deficiency, and antithrombin  deficiencies. Anticardiolipin antibody and lupus anticoagulant analysis may be appropriate for certain patients, as well as homocysteine levels. Contact your local [facility] for information on how to order additi onal testing if desired.    Comment Comment     Comment: (NOTE) **Genetic counselors are available for health care providers to**  discuss results at [phone]). Methodology: DNA analysis of the Factor V gene was performed by allele-specific PCR. The diagnostic sensitivity and specificity is >99% for both. Molecular-based testing is highly accurate, but as in any laboratory test, diagnostic errors may occur. All test results must be combined with clinical information for the most accurate interpretation. References: Voelkerding K (1996).  Clin Lab Med [phone]. Allison Quarry, PhD, [facility] Ruben Reason, PhD, [facility] Jens Som, PhD, [facility] Annetta Maw, M.S., PhD, [facility] Alfredo Bach, PhD, [facility] Norva Riffle, PhD, [facility] Earlean Polka PhD, [facility] Performed At: [facility] [place], [place] [id number] Nechama Guard MD QP:[id number]   Prothrombin gene mutation (Factor 2 Mutation)     Status: None   Collection Time: 08/05/15 12:34 PM  Result Value Ref Range   Recommendations-PTGENE: Comment     Comment: (NOTE) NEGATIVE No mutation identified. Comment: A point mutation (G20210A) in the factor II (prothrombin) gene is the second most common cause of inherited thrombophilia. The incidence of this mutation in the [place] Caucasian population is about 2% and in  the African American population it is approximately 0.5%. This mutation is rare in the [place] and Native American population. Being heterozygous for a prothrombin mutation increases the risk for developing venous thrombosis about 2 to 3 times above the general population risk. Being homozygous for the prothrombin gene mutation increases the relative risk for venous  thrombosis further, although it is not yet known how much further the risk is increased. In women heterozygous for the prothrombin gene mutation, the use of estrogen containing oral contraceptives increases the relative risk of venous thrombosis about 16 times and the risk of developing cerebral thrombosis is also significantly increased. In pregnancy the pr othrombin gene mutation increases risk for venous thrombosis and may increase risk for stillbirth, placental abruption, pre-eclampsia and fetal growth restriction. If the patient possesses two or more congenital or acquired thrombophilic risk factors, the risk for thrombosis may rise to more than the sum of the risk ratios for the individual mutations. This assay detects only the prothrombin G20210A mutation and does not measure genetic abnormalities elsewhere in the genome. Other thrombotic risk factors may be pursued through systematic clinical laboratory analysis. These factors include the R506Q (Leiden) mutation in the Factor V gene, plasma homocysteine levels, as well as testing for deficiencies of antithrombin III, protein C and protein S.    Additional Information Comment     Comment: (NOTE) Genetic Counselors are available for health care providers to discuss results at [phone]). Methodology: DNA analysis of the Factor II gene was performed by PCR amplification followed by restriction analysis. The diagnostic sensitivity is >99% for both. All the tests must be combined with clinical information for the most accurate interpretation. Molecular-based testing is highly accurate, but as in any laboratory test, diagnostic errors may occur. Poort SR, et al. Blood. 1996; 75:1700-1749. Varga EA. Circulation. 2004; 449:Q75-F16. Mervin Hack, et [place]; 19:700-703. Allison Quarry, PhD, [facility] Ruben Reason, PhD, [facility] Jens Som, PhD, [facility] Annetta Maw, M.S., PhD,  [facility] Alfredo Bach, PhD, [facility] Norva Riffle, PhD, [facility] Earlean Polka, PhD, [facility] Performed At: [facility] [address] [place], [place] [id number] Nechama Guard MD [id number]   Homocysteine     Status: Abnormal   Collection Time: 08/05/15 12:34 PM  Result Value Ref Range   Homocysteine 23.8 (H) 0.0 - 15.0 umol/L    Comment: (NOTE) Performed At: [facility] [place], [place] [id number] Lindon Romp MD [id number]   Beta-2-glycoprotein i abs, IgG/M/A     Status: None   Collection Time: 08/05/15 12:34 PM  Result Value Ref Range   Beta-2 Glyco I IgG <9 0 - 20 GPI IgG units    Comment: (NOTE) The reference interval reflects a 3SD or 99th percentile interval, which is thought to represent a potentially clinically significant result in accordance with the International Consensus Statement on the classification criteria for definitive antiphospholipid syndrome (APS). J Thromb Haem 2006;4:295-306.    Beta-2-Glycoprotein I IgM <9 0 - 32 GPI IgM units    Comment: (NOTE) The reference interval reflects a 3SD or 99th percentile interval, which is thought to represent a potentially clinically significant result in accordance with the International Consensus Statement on the classification criteria for definitive antiphospholipid syndrome (APS). J Thromb Haem 2006;4:295-306. Performed At: [facility] [place], [place] [id number] Lindon Romp MD AJ:[id number]    Beta-2-Glycoprotein I IgA <9 0 - 25 GPI IgA units    Comment: (NOTE) The  reference interval reflects a 3SD or 99th percentile interval, which is thought to represent a potentially clinically significant result in accordance with the International Consensus Statement on the classification criteria for definitive antiphospholipid syndrome (APS). J Thromb Haem 2006;4:295-306.   dRVVT Mix     Status: Abnormal   Collection Time: 08/05/15 12:34 PM  Result  Value Ref Range   dRVVT Mix 64.0 (H) 0.0 - 44.0 sec    Comment: (NOTE) Performed At: [facility] [place], [place] [id number] Lindon Romp MD WE:[id number]   dRVVT Confirm     Status: None   Collection Time: 08/05/15 12:34 PM  Result Value Ref Range   dRVVT Confirm 0.9 0.8 - 1.2 ratio    Comment: (NOTE) Performed At: [facility] [place], [place] [id number] Lindon Romp MD [id number]      Assessment & Plan  Problem List Items Addressed This Visit      Cardiovascular and Mediastinum   Pulmonary embolus with infarction - Bilateral   DVT of lower extremity, bilateral ([place])   Essential hypertension - Primary (Chronic)     Other   Hyperlipidemia with target LDL less than 70   H/O splenectomy      No orders of the defined types were placed in this encounter.   1. Essential hypertension Cont. meds  2. Pulmonary embolism, other (HCC) Cont. meds  3. Deep vein thrombosis (DVT) of other vein of both lower extremities (HCC)   4. Hyperlipidemia with target LDL less than 70 Cont. meds  5. H/O splenectomy

## 2016-02-08 ENCOUNTER — Encounter: Payer: Medicare PPO | Admitting: Family Medicine

## 2016-03-06 ENCOUNTER — Other Ambulatory Visit: Payer: Self-pay | Admitting: Family Medicine

## 2016-03-06 DIAGNOSIS — G4700 Insomnia, unspecified: Secondary | ICD-10-CM

## 2016-03-06 DIAGNOSIS — F329 Major depressive disorder, single episode, unspecified: Secondary | ICD-10-CM

## 2016-03-06 DIAGNOSIS — F32A Depression, unspecified: Secondary | ICD-10-CM

## 2016-03-09 ENCOUNTER — Encounter: Payer: Self-pay | Admitting: Family Medicine

## 2016-03-09 ENCOUNTER — Ambulatory Visit (INDEPENDENT_AMBULATORY_CARE_PROVIDER_SITE_OTHER): Payer: Medicare Other | Admitting: Family Medicine

## 2016-03-09 VITALS — BP 127/74 | HR 76 | Temp 98.3°F | Resp 16 | Ht 71.0 in | Wt 250.0 lb

## 2016-03-09 DIAGNOSIS — Z23 Encounter for immunization: Secondary | ICD-10-CM

## 2016-03-09 DIAGNOSIS — N529 Male erectile dysfunction, unspecified: Secondary | ICD-10-CM | POA: Diagnosis not present

## 2016-03-09 DIAGNOSIS — I82403 Acute embolism and thrombosis of unspecified deep veins of lower extremity, bilateral: Secondary | ICD-10-CM | POA: Diagnosis not present

## 2016-03-09 DIAGNOSIS — F329 Major depressive disorder, single episode, unspecified: Secondary | ICD-10-CM

## 2016-03-09 DIAGNOSIS — G4700 Insomnia, unspecified: Secondary | ICD-10-CM

## 2016-03-09 DIAGNOSIS — L821 Other seborrheic keratosis: Secondary | ICD-10-CM | POA: Diagnosis not present

## 2016-03-09 DIAGNOSIS — Z0000 Encounter for general adult medical examination without abnormal findings: Secondary | ICD-10-CM

## 2016-03-09 DIAGNOSIS — F32A Depression, unspecified: Secondary | ICD-10-CM

## 2016-03-09 MED ORDER — SERTRALINE HCL 100 MG PO TABS: 100.0000 mg | ORAL_TABLET | Freq: Every day | ORAL | 6 refills | Status: DC

## 2016-03-09 MED ORDER — SILDENAFIL CITRATE 100 MG PO TABS: 50.0000 mg | ORAL_TABLET | Freq: Every day | ORAL | 11 refills | Status: DC | PRN

## 2016-03-09 NOTE — Progress Notes (Signed)
Name: Craig [name]   MRN: [id number]    DOB: 1941/03/06   Date:03/09/2016       Progress Note  Subjective  Chief Complaint  Chief Complaint  Patient presents with  . Annual Exam    HPI Here for annual physical exam.  Has HBP, elevated lipids, hx of DVT and PE.  He does admit to drinking to calm his nerves and still feels depressed and has trouble sleeping.  Also c/o ED now for past year.    C/o chronic back pain.  He has seen Dr.Chasnis in past.  He talks a lot about it but then says he wants to address this later. No problem-specific Assessment & Plan notes found for this encounter.   Past Medical History:  Diagnosis Date  . Anxiety   . Essential hypertension   . Hyperlipidemia with target LDL less than 70   . Pulmonary emboli (HCC)   . 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 LEFT HEART CATHETERIZATION WITH CORONARY ANGIOGRAM Bilateral 07/27/2014   Procedure: LEFT HEART CATHETERIZATION WITH CORONARY ANGIOGRAM;  Surgeon: Runell Gess, MD;  Location: [facility] CATH LAB;  Service: Cardiovascular;  Laterality: Bilateral;  . SPLENECTOMY, TOTAL     TTP    Family History  Problem Relation Age of Onset  . CAD Father 2  . CAD Brother   . Kidney failure Mother     Social History   Social History  . Marital status: Single    Spouse name: N/A  . Number of children: N/A  . Years of education: N/A   Occupational History  . Not on file.   Social History Main Topics  . Smoking status: Former Smoker    Years: 20.00    Types: Cigarettes  . Smokeless tobacco: Never Used     Comment: Quit tobacco in 1980  . Alcohol use No     Comment: quit 30 years ago  . Drug use:   . Sexual activity: Yes    Birth control/ protection: None   Other Topics Concern  . Not on file   Social History Narrative   Retired from [employer]. Retired x 20 years.  Two children.       Current Outpatient Prescriptions:  .  amLODipine (NORVASC) 5 MG  tablet, Take 1 tablet (5 mg total) by mouth daily., Disp: 90 tablet, Rfl: 3 .  atorvastatin (LIPITOR) 40 MG tablet, Take 1 tablet (40 mg total) by mouth daily at 6 PM., Disp: 90 tablet, Rfl: 3 .  cholecalciferol (VITAMIN D) 1000 UNITS tablet, Take 1,000 Units by mouth daily., Disp: , Rfl:  .  losartan (COZAAR) 100 MG tablet, Take 1 tablet daily for BP, Disp: 90 tablet, Rfl: 3 .  Melatonin 5 MG TABS, Take 10 mg by mouth at bedtime. 3 tabs, Disp: , Rfl:  .  metoprolol succinate (TOPROL-XL) 25 MG 24 hr tablet, Take 1 tablet (25 mg total) by mouth daily., Disp: 90 tablet, Rfl: 3 .  omeprazole (PRILOSEC) 40 MG capsule, Take 1 capsule (40 mg total) by mouth daily., Disp: 90 capsule, Rfl: 3 .  rivaroxaban (XARELTO) 20 MG TABS tablet, Take 1 tablet (20 mg total) by mouth daily with supper., Disp: 90 tablet, Rfl: 3 .  sertraline (ZOLOFT) 100 MG tablet, Take 1 tablet (100 mg total) by mouth daily., Disp: 30 tablet, Rfl: 6 .  tamsulosin (FLOMAX) 0.4 MG CAPS capsule, Take 1 capsule (0.4 mg total) by  mouth daily., Disp: 30 capsule, Rfl: 0 .  vitamin B-12 (CYANOCOBALAMIN) 500 MCG tablet, Take 500 mcg by mouth daily., Disp: , Rfl:  .  vitamin E 1000 UNIT capsule, Take 1,000 Units by mouth daily., Disp: , Rfl:  .  sildenafil (VIAGRA) 100 MG tablet, Take 0.5-1 tablets (50-100 mg total) by mouth daily as needed for erectile dysfunction., Disp: 6 tablet, Rfl: 11  Not on File   Review of Systems  Constitutional: Negative for chills, fever and weight loss.  HENT: Negative for hearing loss.   Eyes: Negative for blurred vision and double vision.  Respiratory: Negative for cough, shortness of breath and wheezing.   Cardiovascular: Negative for chest pain, palpitations and leg swelling.  Gastrointestinal: Negative for abdominal pain, blood in stool and heartburn.  Genitourinary: Negative for dysuria, frequency and urgency.  Musculoskeletal: Positive for back pain. Negative for myalgias.  Skin: Negative for rash.   Neurological: Negative for dizziness, tremors and headaches.  Psychiatric/Behavioral: Positive for depression. The patient is nervous/anxious and has insomnia.       Objective  Vitals:   03/09/16 1002  BP: 127/74  Pulse: 76  Resp: 16  Temp: 98.3 F (36.8 C)  TempSrc: Oral  Weight: 250 lb (113.4 kg)  Height: 5\' 11"  (1.803 m)    Physical Exam  Constitutional: He is oriented to person, place, and time and well-developed, well-nourished, and in no distress. No distress.  HENT:  Head: Normocephalic and atraumatic.  Right Ear: External ear normal.  Left Ear: External ear normal.  Nose: Nose normal.  Mouth/Throat: Oropharynx is clear and moist. No oropharyngeal exudate.  Eyes: Conjunctivae and EOM are normal. Pupils are equal, round, and reactive to light. No scleral icterus.  Fundoscopic exam:      The right eye shows no arteriolar narrowing, no AV nicking, no exudate, no hemorrhage and no papilledema.       The left eye shows no arteriolar narrowing, no AV nicking, no exudate, no hemorrhage and no papilledema.  Neck: Normal range of motion. Neck supple. Carotid bruit is not present. No thyromegaly present.  Cardiovascular: Normal rate, regular rhythm and normal heart sounds.  Exam reveals no gallop and no friction rub.   No murmur heard. Pulmonary/Chest: Effort normal and breath sounds normal. No respiratory distress. He has no wheezes. He has no rales.  Abdominal: Soft. Bowel sounds are normal. He exhibits no distension and no mass. There is no tenderness.  Musculoskeletal: He exhibits no edema.  Lymphadenopathy:    He has no cervical adenopathy.  Neurological: He is alert and oriented to person, place, and time.  Skin:  Multiple SKs and some AKs.  Psychiatric:  Affect is depressed..  Mildly anxious.  Vitals reviewed.      No results found for this or any previous visit (from the past 2160 hour(s)).   Assessment & Plan  Problem List Items Addressed This Visit       Cardiovascular and Mediastinum   DVT of lower extremity, bilateral (HCC)   Relevant Medications   sildenafil (VIAGRA) 100 MG tablet     Musculoskeletal and Integument   Seborrheic keratosis   Relevant Orders   Ambulatory referral to Dermatology     Other   Insomnia   Relevant Medications   sertraline (ZOLOFT) 100 MG tablet   Depression   Relevant Medications   sertraline (ZOLOFT) 100 MG tablet    Other Visit Diagnoses    Health maintenance examination    -  Primary  Relevant Orders   COMPLETE METABOLIC PANEL WITH GFR   Lipid Profile   CBC with Differential   TSH   PSA   Tdap vaccine greater than or equal to 7yo IM   Pneumococcal conjugate vaccine 13-valent   Erectile dysfunction, unspecified erectile dysfunction type          Meds ordered this encounter  Medications  . sertraline (ZOLOFT) 100 MG tablet    Sig: Take 1 tablet (100 mg total) by mouth daily.    Dispense:  30 tablet    Refill:  6  . sildenafil (VIAGRA) 100 MG tablet    Sig: Take 0.5-1 tablets (50-100 mg total) by mouth daily as needed for erectile dysfunction.    Dispense:  6 tablet    Refill:  11   1. Insomnia  - sertraline (ZOLOFT) 100 MG tablet; Take 1 tablet (100 mg total) by mouth daily.  Dispense: 30 tablet; Refill: 6  2. Depression  - sertraline (ZOLOFT) 100 MG tablet; Take 1 tablet (100 mg total) by mouth daily.  Dispense: 30 tablet; Refill: 6 - increased from 50 mg/d  3. Seborrheic keratosis  - Ambulatory referral to Dermatology  4. Health maintenance examination  - COMPLETE METABOLIC PANEL WITH GFR - Lipid Profile - CBC with Differential - TSH - PSA - Tdap vaccine greater than or equal to 7yo IM - Pneumococcal conjugate vaccine 13-valent  5.ED    Viagra 100, 1/2 - 1 as needed.

## 2016-03-26 ENCOUNTER — Other Ambulatory Visit: Payer: Self-pay | Admitting: Family Medicine

## 2016-03-26 DIAGNOSIS — I10 Essential (primary) hypertension: Secondary | ICD-10-CM

## 2016-04-02 ENCOUNTER — Other Ambulatory Visit: Payer: Self-pay | Admitting: Family Medicine

## 2016-04-02 DIAGNOSIS — I2699 Other pulmonary embolism without acute cor pulmonale: Secondary | ICD-10-CM

## 2016-04-02 DIAGNOSIS — I82403 Acute embolism and thrombosis of unspecified deep veins of lower extremity, bilateral: Secondary | ICD-10-CM

## 2016-04-05 ENCOUNTER — Other Ambulatory Visit: Payer: Self-pay | Admitting: Family Medicine

## 2016-04-05 DIAGNOSIS — E785 Hyperlipidemia, unspecified: Secondary | ICD-10-CM

## 2016-04-05 DIAGNOSIS — I10 Essential (primary) hypertension: Secondary | ICD-10-CM

## 2016-05-11 ENCOUNTER — Ambulatory Visit (INDEPENDENT_AMBULATORY_CARE_PROVIDER_SITE_OTHER): Payer: Medicare Other | Admitting: Family Medicine

## 2016-05-11 ENCOUNTER — Encounter: Payer: Self-pay | Admitting: Family Medicine

## 2016-05-11 VITALS — BP 137/69 | HR 59 | Temp 98.3°F | Resp 16 | Ht 71.0 in | Wt 248.0 lb

## 2016-05-11 DIAGNOSIS — N401 Enlarged prostate with lower urinary tract symptoms: Secondary | ICD-10-CM

## 2016-05-11 DIAGNOSIS — F320 Major depressive disorder, single episode, mild: Secondary | ICD-10-CM | POA: Diagnosis not present

## 2016-05-11 DIAGNOSIS — G4700 Insomnia, unspecified: Secondary | ICD-10-CM

## 2016-05-11 DIAGNOSIS — E784 Other hyperlipidemia: Secondary | ICD-10-CM

## 2016-05-11 DIAGNOSIS — Z23 Encounter for immunization: Secondary | ICD-10-CM | POA: Diagnosis not present

## 2016-05-11 DIAGNOSIS — R351 Nocturia: Secondary | ICD-10-CM

## 2016-05-11 DIAGNOSIS — I10 Essential (primary) hypertension: Secondary | ICD-10-CM

## 2016-05-11 DIAGNOSIS — E7849 Other hyperlipidemia: Secondary | ICD-10-CM

## 2016-05-11 LAB — CBC WITH DIFFERENTIAL/PLATELET
BASOS PCT: 1 %
Basophils Absolute: 65 cells/uL (ref 0–200)
EOS ABS: 65 {cells}/uL (ref 15–500)
Eosinophils Relative: 1 %
HEMATOCRIT: 44.7 % (ref 38.5–50.0)
Hemoglobin: 14.7 g/dL (ref 13.2–17.1)
LYMPHS ABS: 2925 {cells}/uL (ref 850–3900)
Lymphocytes Relative: 45 %
MCH: 34.8 pg — ABNORMAL HIGH (ref 27.0–33.0)
MCHC: 32.9 g/dL (ref 32.0–36.0)
MCV: 105.9 fL — AB (ref 80.0–100.0)
MONO ABS: 780 {cells}/uL (ref 200–950)
MPV: 10.3 fL (ref 7.5–12.5)
Monocytes Relative: 12 %
NEUTROS ABS: 2665 {cells}/uL (ref 1500–7800)
Neutrophils Relative %: 41 %
Platelets: 283 10*3/uL (ref 140–400)
RBC: 4.22 MIL/uL (ref 4.20–5.80)
RDW: 14.6 % (ref 11.0–15.0)
WBC: 6.5 10*3/uL (ref 3.8–10.8)

## 2016-05-11 NOTE — Progress Notes (Signed)
Name: Craig BaumgartnerJames L Botkin   MRN: [id number]    DOB: 12/16/1940   Date:05/11/2016       Progress Note  Subjective  Chief Complaint  Chief Complaint  Patient presents with  . Insomnia    HPI Here for f/u of insomnia.  He was increased to Sertraline 100 mg / day last visit.  He is taking his Sertraline 1/2 tablet at supper and 1/2 tab at bedtime. He is sleeping well now.  He had complained of some small tremor that has resolved with exercise.  No problem-specific Assessment & Plan notes found for this encounter.   Past Medical History:  Diagnosis Date  . Anxiety   . Essential hypertension   . Hyperlipidemia with target LDL less than 70   . Pulmonary emboli (HCC)   . 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[facility](HCC)     Past Surgical History:  Procedure Laterality Date  . LEFT HEART CATHETERIZATION WITH CORONARY ANGIOGRAM Bilateral 07/27/2014   Procedure: LEFT HEART CATHETERIZATION WITH CORONARY ANGIOGRAM;  Surgeon: Runell GessJonathan J Berry, MD;  [facility];  Service: Cardiovascular;  Laterality: Bilateral;  . SPLENECTOMY, TOTAL     TTP    Family History  Problem Relation Age of Onset  . CAD Father 7260  . Kidney failure Mother   . CAD Brother     Social History   Social History  . Marital status: Single    Spouse name: N/A  . Number of children: N/A  . Years of education: N/A   Occupational History  . Not on file.   Social History Main Topics  . Smoking status: Former Smoker    Years: 20.00    Types: Cigarettes  . Smokeless tobacco: Never Used     Comment: Quit tobacco in 1980  . Alcohol use No     Comment: quit 30 years ago  . Drug use:   . Sexual activity: Yes    Birth control/ protection: None   Other Topics Concern  . Not on file   Social History Narrative   Retired [employer]. Retired x 20 years.  Two children.       Current Outpatient Prescriptions:  .  amLODipine (NORVASC) 5 MG tablet, Take 1 tablet (5 mg total) by mouth daily., Disp: 90 tablet, Rfl: 3 .   atorvastatin (LIPITOR) 40 MG tablet, take 1 tablet by mouth once daily AT 6PM, Disp: 90 tablet, Rfl: 0 .  cholecalciferol (VITAMIN D) 1000 UNITS tablet, Take 1,000 Units by mouth daily., Disp: , Rfl:  .  losartan (COZAAR) 100 MG tablet, take 1 tablet by mouth once daily for blood pressure, Disp: 90 tablet, Rfl: 3 .  Melatonin 5 MG TABS, Take 10 mg by mouth at bedtime. 3 tabs, Disp: , Rfl:  .  metoprolol succinate (TOPROL-XL) 25 MG 24 hr tablet, take 1 tablet by mouth once daily, Disp: 90 tablet, Rfl: 0 .  omeprazole (PRILOSEC) 40 MG capsule, Take 1 capsule (40 mg total) by mouth daily., Disp: 90 capsule, Rfl: 3 .  sertraline (ZOLOFT) 100 MG tablet, Take 1 tablet (100 mg total) by mouth daily., Disp: 30 tablet, Rfl: 6 .  sildenafil (VIAGRA) 100 MG tablet, Take 0.5-1 tablets (50-100 mg total) by mouth daily as needed for erectile dysfunction., Disp: 6 tablet, Rfl: 11 .  tamsulosin (FLOMAX) 0.4 MG CAPS capsule, Take 1 capsule (0.4 mg total) by mouth daily., Disp: 30 capsule, Rfl: 0 .  vitamin B-12 (CYANOCOBALAMIN) 500 MCG tablet, Take 500 mcg by mouth  daily., Disp: , Rfl:  .  vitamin E 1000 UNIT capsule, Take 1,000 Units by mouth daily., Disp: , Rfl:  .  XARELTO 20 MG TABS tablet, Take 1 tablet by mouth  daily with supper, Disp: 90 tablet, Rfl: 0  Not on File   Review of Systems  Constitutional: Negative for chills, fever, malaise/fatigue and weight loss.  HENT: Negative for hearing loss.   Eyes: Negative for blurred vision and double vision.  Respiratory: Negative for cough, shortness of breath and wheezing.   Cardiovascular: Negative for chest pain, palpitations and leg swelling.  Gastrointestinal: Negative for abdominal pain, blood in stool and heartburn.  Genitourinary: Negative for dysuria, frequency and urgency.  Musculoskeletal: Negative for joint pain and myalgias.  Skin: Negative for rash.  Neurological: Negative for dizziness, tremors, weakness and headaches.   Psychiatric/Behavioral: Negative for depression. The patient is not nervous/anxious and does not have insomnia.       Objective  Vitals:   05/11/16 0957  BP: 137/69  Pulse: (!) 59  Resp: 16  Temp: 98.3 F (36.8 C)  TempSrc: Oral  Weight: 248 lb (112.5 kg)  Height: 5\' 11"  (1.803 m)    Physical Exam  Constitutional: He is oriented to person, place, and time. No distress.  HENT:  Head: Normocephalic and atraumatic.  Eyes: Conjunctivae and EOM are normal. Pupils are equal, round, and reactive to light. No scleral icterus.  Neck: Normal range of motion. Neck supple. Carotid bruit is not present. No thyromegaly present.  Cardiovascular: Normal rate, regular rhythm and normal heart sounds.  Exam reveals no gallop and no friction rub.   No murmur heard. Pulmonary/Chest: Effort normal and breath sounds normal. No respiratory distress. He has no wheezes. He has no rales.  Abdominal: Soft. Bowel sounds are normal. He exhibits no distension and no mass. There is no tenderness.  Musculoskeletal: He exhibits no edema.  Lymphadenopathy:    He has no cervical adenopathy.  Neurological: He is alert and oriented to person, place, and time.  Vitals reviewed.      No results found for this or any previous visit (from the past 2160 hour(s)).   Assessment & Plan  Problem List Items Addressed This Visit      Cardiovascular and Mediastinum   Essential hypertension (Chronic)   Relevant Orders   COMPLETE METABOLIC PANEL WITH GFR   CBC with Differential     Genitourinary   Nocturia associated with benign prostatic hyperplasia   Relevant Orders   PSA     Other   HLD (hyperlipidemia)   Relevant Orders   Lipid Profile   Insomnia   Depression    Other Visit Diagnoses    Need for vaccination    -  Primary   Relevant Orders   Flu vaccine HIGH DOSE PF (Fluzone High dose) (Completed)      No orders of the defined types were placed in this encounter. 1. Need for vaccination  -  Flu vaccine HIGH DOSE PF (Fluzone High dose)  2. Insomnia, unspecified type Cont Sertraline  3. Essential hypertension Cont meds - COMPLETE METABOLIC PANEL WITH GFR - CBC with Differential  4. Mild single current episode of major depressive disorder (HCC)   5. Other hyperlipidemia Cont med - Lipid Profile  6. Nocturia associated with benign prostatic hyperplasia  - PSA

## 2016-05-12 LAB — LIPID PANEL
CHOL/HDL RATIO: 1.5 ratio (ref <=5.0)
Cholesterol: 192 mg/dL (ref 125–200)
HDL: 132 mg/dL (ref >=40)
LDL CALC: 41 mg/dL (ref <130)
TRIGLYCERIDES: 93 mg/dL (ref <150)
VLDL: 19 mg/dL (ref <30)

## 2016-05-12 LAB — COMPLETE METABOLIC PANEL WITH GFR
ALT: 13 U/L (ref 9–46)
AST: 30 U/L (ref 10–35)
Albumin: 3.9 g/dL (ref 3.6–5.1)
Alkaline Phosphatase: 81 U/L (ref 40–115)
BUN: 13 mg/dL (ref 7–25)
CHLORIDE: 102 mmol/L (ref 98–110)
CO2: 29 mmol/L (ref 20–31)
Calcium: 9.6 mg/dL (ref 8.6–10.3)
Creat: 0.84 mg/dL (ref 0.70–1.18)
GFR, EST NON AFRICAN AMERICAN: 86 mL/min (ref >=60)
Glucose, Bld: 106 mg/dL — ABNORMAL HIGH (ref 65–99)
POTASSIUM: 4.7 mmol/L (ref 3.5–5.3)
Sodium: 141 mmol/L (ref 135–146)
Total Bilirubin: 0.9 mg/dL (ref 0.2–1.2)
Total Protein: 7 g/dL (ref 6.1–8.1)

## 2016-05-12 LAB — PSA: PSA: 1.1 ng/mL (ref <=4.0)

## 2016-05-19 ENCOUNTER — Telehealth: Payer: Self-pay | Admitting: *Deleted

## 2016-05-19 DIAGNOSIS — R7309 Other abnormal glucose: Secondary | ICD-10-CM

## 2016-05-19 NOTE — Telephone Encounter (Signed)
[facility] was not able to add A1C. We can check at next visit or patient can come in and have drawn.

## 2016-05-22 ENCOUNTER — Other Ambulatory Visit: Payer: Self-pay | Admitting: *Deleted

## 2016-05-22 DIAGNOSIS — R7309 Other abnormal glucose: Secondary | ICD-10-CM

## 2016-05-22 NOTE — Telephone Encounter (Signed)
[facility] him [facility]

## 2016-05-23 ENCOUNTER — Other Ambulatory Visit: Payer: Medicare Other

## 2016-05-23 LAB — HEMOGLOBIN A1C
HEMOGLOBIN A1C: 5 % (ref <5.7)
MEAN PLASMA GLUCOSE: 97 mg/dL

## 2016-05-29 ENCOUNTER — Other Ambulatory Visit: Payer: Self-pay | Admitting: Family Medicine

## 2016-05-29 ENCOUNTER — Other Ambulatory Visit: Payer: Self-pay | Admitting: *Deleted

## 2016-05-29 DIAGNOSIS — I2699 Other pulmonary embolism without acute cor pulmonale: Secondary | ICD-10-CM

## 2016-05-29 DIAGNOSIS — I10 Essential (primary) hypertension: Secondary | ICD-10-CM

## 2016-05-29 DIAGNOSIS — I82403 Acute embolism and thrombosis of unspecified deep veins of lower extremity, bilateral: Secondary | ICD-10-CM

## 2016-05-29 MED ORDER — AMLODIPINE BESYLATE 5 MG PO TABS: 5.0000 mg | ORAL_TABLET | Freq: Every day | ORAL | 3 refills | Status: DC

## 2016-05-31 ENCOUNTER — Other Ambulatory Visit: Payer: Self-pay | Admitting: Family Medicine

## 2016-05-31 DIAGNOSIS — I824Y3 Acute embolism and thrombosis of unspecified deep veins of proximal lower extremity, bilateral: Secondary | ICD-10-CM

## 2016-05-31 DIAGNOSIS — I2699 Other pulmonary embolism without acute cor pulmonale: Secondary | ICD-10-CM

## 2016-06-01 MED ORDER — RIVAROXABAN 20 MG PO TABS: 20.0000 mg | ORAL_TABLET | Freq: Every day | ORAL | 3 refills | Status: DC

## 2016-06-04 ENCOUNTER — Other Ambulatory Visit: Payer: Self-pay | Admitting: Family Medicine

## 2016-06-04 DIAGNOSIS — K219 Gastro-esophageal reflux disease without esophagitis: Secondary | ICD-10-CM

## 2016-07-30 ENCOUNTER — Other Ambulatory Visit: Payer: Self-pay | Admitting: Family Medicine

## 2016-07-30 DIAGNOSIS — E785 Hyperlipidemia, unspecified: Secondary | ICD-10-CM

## 2016-07-30 DIAGNOSIS — I10 Essential (primary) hypertension: Secondary | ICD-10-CM

## 2016-09-28 ENCOUNTER — Encounter: Payer: Self-pay | Admitting: *Deleted

## 2016-09-28 ENCOUNTER — Ambulatory Visit (INDEPENDENT_AMBULATORY_CARE_PROVIDER_SITE_OTHER): Payer: Medicare Other | Admitting: Family Medicine

## 2016-09-28 ENCOUNTER — Encounter: Payer: Self-pay | Admitting: Family Medicine

## 2016-09-28 VITALS — BP 140/70 | HR 67 | Temp 98.1°F | Resp 16 | Ht 71.0 in | Wt 264.0 lb

## 2016-09-28 DIAGNOSIS — F331 Major depressive disorder, recurrent, moderate: Secondary | ICD-10-CM | POA: Diagnosis not present

## 2016-09-28 DIAGNOSIS — F320 Major depressive disorder, single episode, mild: Secondary | ICD-10-CM

## 2016-09-28 DIAGNOSIS — I2699 Other pulmonary embolism without acute cor pulmonale: Secondary | ICD-10-CM

## 2016-09-28 DIAGNOSIS — E7849 Other hyperlipidemia: Secondary | ICD-10-CM

## 2016-09-28 DIAGNOSIS — K2270 Barrett's esophagus without dysplasia: Secondary | ICD-10-CM | POA: Diagnosis not present

## 2016-09-28 DIAGNOSIS — E784 Other hyperlipidemia: Secondary | ICD-10-CM | POA: Diagnosis not present

## 2016-09-28 DIAGNOSIS — I10 Essential (primary) hypertension: Secondary | ICD-10-CM

## 2016-09-28 DIAGNOSIS — G4700 Insomnia, unspecified: Secondary | ICD-10-CM

## 2016-09-28 DIAGNOSIS — F5101 Primary insomnia: Secondary | ICD-10-CM

## 2016-09-28 MED ORDER — SERTRALINE HCL 100 MG PO TABS
ORAL_TABLET | ORAL | 6 refills | Status: DC
Start: 2016-09-28 — End: 2016-12-27

## 2016-09-28 NOTE — Progress Notes (Signed)
Name: Craig BaumgartnerJames L [name]   MRN: [id number]    DOB: 10/17/1940   Date:09/28/2016       Progress Note  Subjective  Chief Complaint  Chief Complaint  Patient presents with  . Hypertension  . Depression    HPI Here for f/u of HBP and Depression.   Also with hyperlipidemia. He is taking meds.  His PHQ-9 score is 14.  He is sleeping well but has vivid dreams.  He has poor motivation.  Also is agitated.  Feels that he has ADHD ???.  No problem-specific Assessment & Plan notes found for this encounter.   Past Medical History:  Diagnosis Date  . Anxiety   . Essential hypertension   . Hyperlipidemia with target LDL less than 70   . Pulmonary emboli (HCC)   . 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[facility](HCC)     Past Surgical History:  Procedure Laterality Date  . LEFT HEART CATHETERIZATION WITH CORONARY ANGIOGRAM Bilateral 07/27/2014   Procedure: LEFT HEART CATHETERIZATION WITH CORONARY ANGIOGRAM;  Surgeon: Runell GessJonathan J Berry, MD;  [facility];  Service: Cardiovascular;  Laterality: Bilateral;  . SPLENECTOMY, TOTAL     TTP    Family History  Problem Relation Age of Onset  . CAD Father 9560  . Kidney failure Mother   . CAD Brother     Social History   Social History  . Marital status: Single    Spouse name: N/A  . Number of children: N/A  . Years of education: N/A   Occupational History  . Not on file.   Social History Main Topics  . Smoking status: Former Smoker    Years: 20.00    Types: Cigarettes  . Smokeless tobacco: Never Used     Comment: Quit tobacco in 1980  . Alcohol use No     Comment: quit 30 years ago  . Drug use: Yes  . Sexual activity: Yes    Birth control/ protection: None   Other Topics Concern  . Not on file   Social History Narrative   Retired from [employer]. Retired x 20 years.  Two children.       Current Outpatient Prescriptions:  .  amLODipine (NORVASC) 5 MG tablet, Take 1 tablet (5 mg total) by mouth daily., Disp: 90 tablet, Rfl: 3 .  atorvastatin  (LIPITOR) 40 MG tablet, take 1 tablet by mouth once daily AT 6PM, Disp: 90 tablet, Rfl: 1 .  cholecalciferol (VITAMIN D) 1000 UNITS tablet, Take 1,000 Units by mouth daily., Disp: , Rfl:  .  losartan (COZAAR) 100 MG tablet, take 1 tablet by mouth once daily for blood pressure, Disp: 90 tablet, Rfl: 3 .  Melatonin 5 MG TABS, Take 10 mg by mouth at bedtime. 3 tabs, Disp: , Rfl:  .  metoprolol succinate (TOPROL-XL) 25 MG 24 hr tablet, take 1 tablet by mouth once daily, Disp: 90 tablet, Rfl: 1 .  omeprazole (PRILOSEC) 40 MG capsule, take 1 capsule by mouth once daily, Disp: 90 capsule, Rfl: 3 .  rivaroxaban (XARELTO) 20 MG TABS tablet, Take 1 tablet (20 mg total) by mouth daily with supper., Disp: 90 tablet, Rfl: 3 .  sertraline (ZOLOFT) 100 MG tablet, Take 1.5 tablets each morning., Disp: 45 tablet, Rfl: 6 .  vitamin B-12 (CYANOCOBALAMIN) 500 MCG tablet, Take 500 mcg by mouth daily., Disp: , Rfl:  .  vitamin E 1000 UNIT capsule, Take 1,000 Units by mouth daily., Disp: , Rfl:  .  sildenafil (VIAGRA) 100 MG tablet, Take  0.5-1 tablets (50-100 mg total) by mouth daily as needed for erectile dysfunction. (Patient not taking: Reported on 09/28/2016), Disp: 6 tablet, Rfl: 11  Not on File   Review of Systems  Constitutional: Negative for chills, fever, malaise/fatigue and weight loss.  HENT: Negative for hearing loss and tinnitus.   Eyes: Negative for blurred vision and double vision.  Respiratory: Negative for cough, shortness of breath and wheezing.   Cardiovascular: Negative for chest pain, palpitations and leg swelling.  Gastrointestinal: Negative for abdominal pain, blood in stool, heartburn, nausea and vomiting.  Genitourinary: Negative for dysuria, frequency and urgency.  Musculoskeletal: Negative for joint pain and myalgias.  Skin: Negative for rash.  Neurological: Negative for dizziness, tingling, tremors, weakness and headaches.      Objective  Vitals:   09/28/16 1000 09/28/16 1144   BP: (!) 145/71 140/70  Pulse: 67   Resp: 16   Temp: 98.1 F (36.7 C)   TempSrc: Oral   Weight: 264 lb (119.7 kg)   Height: 5\' 11"  (1.803 m)     Physical Exam  Constitutional: He is oriented to person, place, and time. No distress.  HENT:  Head: Normocephalic and atraumatic.  Eyes: Conjunctivae and EOM are normal. Pupils are equal, round, and reactive to light. No scleral icterus.  Neck: Normal range of motion. Neck supple. No thyromegaly present.  Cardiovascular: Normal rate, regular rhythm and normal heart sounds.  Exam reveals no gallop and no friction rub.   No murmur heard. Pulmonary/Chest: Effort normal and breath sounds normal. No respiratory distress. He has no wheezes. He has no rales.  Abdominal: Bowel sounds are normal. He exhibits no distension and no mass. There is no tenderness.  Musculoskeletal: He exhibits no edema.  Lymphadenopathy:    He has no cervical adenopathy.  Neurological: He is alert and oriented to person, place, and time.  Psychiatric:  Affect is mildly depressed.  Vitals reviewed.      No results found for this or any previous visit (from the past 2160 hour(s)).   Assessment & Plan  Problem List Items Addressed This Visit      Cardiovascular and Mediastinum   Essential hypertension (Chronic)   Pulmonary embolus with infarction - Bilateral     Digestive   Barrett esophagus     Other   HLD (hyperlipidemia)   Insomnia   Relevant Medications   sertraline (ZOLOFT) 100 MG tablet   Depression - Primary   Relevant Medications   sertraline (ZOLOFT) 100 MG tablet      Meds ordered this encounter  Medications  . sertraline (ZOLOFT) 100 MG tablet    Sig: Take 1.5 tablets each morning.    Dispense:  45 tablet    Refill:  6   1. Mild single current episode of major depressive disorder (HCC)   2. Essential hypertension Cont Am lodipine, Mewtoprolol,  And Losartan.  3. Pulmonary embolus with infarction - Bilateral Cont Xeralto 4.  Barrett's esophagus without dysplasia Cont. Omeprazole  5. Insomnia, unspecified type   6. Other hyperlipidemia Cont Lipitor  7. Primary insomnia  - sertraline (ZOLOFT) 100 MG tablet; Take 1.5 tablets each morning.  Dispense: 45 tablet; Refill: 6 (increased from 1/d.)  8. Moderate episode of recurrent major depressive disorder (HCC)  - sertraline (ZOLOFT) 100 MG tablet; Take 1.5 tablets each morning.  Dispense: 45 tablet; Refill: 6

## 2016-12-27 ENCOUNTER — Encounter: Payer: Self-pay | Admitting: Family Medicine

## 2016-12-27 ENCOUNTER — Other Ambulatory Visit: Payer: Self-pay | Admitting: Family Medicine

## 2016-12-27 ENCOUNTER — Ambulatory Visit (INDEPENDENT_AMBULATORY_CARE_PROVIDER_SITE_OTHER): Payer: Medicare Other | Admitting: Family Medicine

## 2016-12-27 VITALS — BP 140/68 | HR 64 | Temp 97.8°F | Resp 16 | Ht 76.0 in | Wt 261.0 lb

## 2016-12-27 DIAGNOSIS — Z125 Encounter for screening for malignant neoplasm of prostate: Secondary | ICD-10-CM

## 2016-12-27 DIAGNOSIS — Z86718 Personal history of other venous thrombosis and embolism: Secondary | ICD-10-CM

## 2016-12-27 DIAGNOSIS — R7309 Other abnormal glucose: Secondary | ICD-10-CM

## 2016-12-27 DIAGNOSIS — R351 Nocturia: Secondary | ICD-10-CM

## 2016-12-27 DIAGNOSIS — F3342 Major depressive disorder, recurrent, in full remission: Secondary | ICD-10-CM

## 2016-12-27 DIAGNOSIS — F5101 Primary insomnia: Secondary | ICD-10-CM | POA: Diagnosis not present

## 2016-12-27 DIAGNOSIS — Z0000 Encounter for general adult medical examination without abnormal findings: Secondary | ICD-10-CM

## 2016-12-27 DIAGNOSIS — N522 Drug-induced erectile dysfunction: Secondary | ICD-10-CM

## 2016-12-27 DIAGNOSIS — N401 Enlarged prostate with lower urinary tract symptoms: Secondary | ICD-10-CM

## 2016-12-27 DIAGNOSIS — I2699 Other pulmonary embolism without acute cor pulmonale: Secondary | ICD-10-CM | POA: Diagnosis not present

## 2016-12-27 DIAGNOSIS — I10 Essential (primary) hypertension: Secondary | ICD-10-CM

## 2016-12-27 DIAGNOSIS — E782 Mixed hyperlipidemia: Secondary | ICD-10-CM

## 2016-12-27 DIAGNOSIS — Z7901 Long term (current) use of anticoagulants: Secondary | ICD-10-CM

## 2016-12-27 MED ORDER — SILDENAFIL CITRATE 20 MG PO TABS: ORAL_TABLET | ORAL | 2 refills | Status: DC

## 2016-12-27 MED ORDER — TRAZODONE HCL 100 MG PO TABS: 50.0000 mg | ORAL_TABLET | Freq: Every day | ORAL | 5 refills | Status: DC

## 2016-12-27 NOTE — Progress Notes (Signed)
Subjective:    Patient ID: Craig [name], male    DOB: 13-Nov-1940, 76 y.o.   MRN: [id number]  Craig [name] is a 76 y.o. male presenting on 12/27/2016 for Hypertension   HPI  Insomnia, Chronic / History of Mood Disorder - History of primary insomnia since age 48 by report, has not had a sleep study or other tests. He describes difficulty falling asleep with inconsequential "wandering mind" without significant worrying or anxiety. He is unsure "definition" of depression. - Reports he has been on Zoloft for past 1-2 years for insomnia, admits he has increased vivid dreams - He was titrated up to 150mg  1.5 pills in past, had been on this for few months then had worsening sleep, went back down to 100mg  daily (x2 of the 50mg  pills) - Taking Melatonin 5mg  x 3-4 for many years, seems to help him fall asleep, if he doesn't take it then he has a hard time falling asleep - Admits poor sleep hygiene with decreased exercise, sedentary lifestyle - Drinks alcohol mostly vodka 6-8 oz daily, no history of alcohol abuse and no history of withdrawal or complications, he drinks usually in afternoon 4-7pm most days, then feels "sober" before going to bed in evening, says it "helps him sleep" - See PHQ / GAD scores - Denies any waking up overnight due to pain nocturia or other cause, difficulty breathing apnea, panic attacks, anxiety, depressed mood, suicidal or homicidal ideation  Erectile Dysfunction - Reports problem for past 1-2 years, unsure exact onset, previously erectile function mostly normal, now thinks it is secondary to side effect from when he started Zoloft. Last visit, he was given rx of Viagra 100mg  but this was unable to be filled by pharmacy, but he may need a printed rx to take to discount pharmacy today - Admits to good sexual desire. Does get nocturnal erections. - Requests new generic sildenafil rx  History of Recurrent PE: - Reviewed prior history with initial dx pulmonary embolism (PE) in  07/2014 (also at that time identified bilateral LE DVT R peroneal L posterior tibial) , treated with Xarelto for 6 months, then had recurrence of PE, treated again with Xarelto indefinitely  PMH - History of recurrent kidney stones, had seen Urologist  Retired from [employer] for past 25 years  Depression screen [facility] 2/9 12/27/2016 09/28/2016 03/09/2016  Decreased Interest 0 3 3  Down, Depressed, Hopeless 0 2 3  PHQ - 2 Score 0 5 6  Altered sleeping 3 3 3   Tired, decreased energy 3 3 3   Change in appetite 0 3 0  Feeling bad or failure about yourself  0 0 0  Trouble concentrating 0 0 0  Moving slowly or fidgety/restless 0 0 0  Suicidal thoughts 0 0 0  PHQ-9 Score 6 14 12   Difficult doing work/chores - Not difficult at all Not difficult at all   GAD 7 : Generalized Anxiety Score 12/27/2016  Nervous, Anxious, on Edge 0  Control/stop worrying 0  Worry too much - different things 1  Trouble relaxing 0  Restless 0  Easily annoyed or irritable 0  Afraid - awful might happen 0  Total GAD 7 Score 1  Anxiety Difficulty Not difficult at all     Social History  Substance Use Topics  . Smoking status: Former Smoker    Years: 20.00    Types: Cigarettes  . Smokeless tobacco: Never Used     Comment: Quit tobacco in 1980  . Alcohol use  0.6 oz/week    1 Shots of liquor per week     Comment: daily    Review of Systems Per HPI unless specifically indicated above     Objective:    BP 140/68   Pulse 64   Temp 97.8 F (36.6 C) (Oral)   Resp 16   Ht 6\' 4"  (1.93 m)   Wt 261 lb (118.4 kg)   BMI 31.77 kg/m   Wt Readings from Last 3 Encounters:  12/27/16 261 lb (118.4 kg)  09/28/16 264 lb (119.7 kg)  05/11/16 248 lb (112.5 kg)    Physical Exam  Constitutional: He is oriented to person, place, and time. He appears well-developed and well-nourished. No distress.  Well-appearing, comfortable, cooperative  HENT:  Head: Normocephalic and atraumatic.  Mouth/Throat: Oropharynx is  clear and moist.  Eyes: Conjunctivae are normal. Right eye exhibits no discharge. Left eye exhibits no discharge.  Neck: Normal range of motion. Neck supple. No thyromegaly present.  Cardiovascular: Normal rate, regular rhythm, normal heart sounds and intact distal pulses.   No murmur heard. Pulmonary/Chest: Effort normal and breath sounds normal. No respiratory distress. He has no wheezes. He has no rales.  Musculoskeletal: Normal range of motion. He exhibits edema (non pitting edema bilateral lower extremities, symmetrical, non tender, with varicose veins).  Lymphadenopathy:    He has no cervical adenopathy.  Neurological: He is alert and oriented to person, place, and time.  Skin: Skin is warm and dry. No rash noted. He is not diaphoretic. No erythema.  Psychiatric: He has a normal mood and affect. His behavior is normal.  Well groomed, good eye contact, normal speech and thoughts. Good insight into health. Does not appear anxious.  Nursing note and vitals reviewed.    Results for orders placed or performed in visit on 05/22/16  HgB A1c  Result Value Ref Range   Hgb A1c MFr Bld 5.0 <5.7 %   Mean Plasma Glucose 97 mg/dL      Assessment & Plan:   Problem List Items Addressed This Visit    Recurrent pulmonary emboli (HCC)    Stable, on chronic anticoagulation lifelong due to b/l DVT and recurrent PE - No new recurrence or concerns - Continue Xarelto per Hematology      Relevant Medications   sildenafil (REVATIO) 20 MG tablet   Insomnia - Primary    Suspect primary insomnia is main concern, but uncertain if clear diagnosis of depression, seems to be more in remission now, has more other symptoms less depressed mood PHQ higher in past, now improved Had been on Zoloft SSRI but failed due to impotence side effect  Plan: 1. Discontinue Zoloft - taper over 2-3 weeks since was on dose up to 150mg , instructions per AVS 2. Start new med for insomnia / mood Trazodone 100mg  tabs - cut  in half for 50mg  nightly then titrate up as advised to full dose, may take few weeks for full effect. Counseling on potential side effects including priapism 3. Remain off SSRI future can consider alternative SSRI if needed as adjunct find option with less sexual side effect, maybe Lexapro 4. Advised to limit alcohol intake, since he seems to be self medicating in a way, should help sleep as well 5. Handout given for sleep hygiene 6. Follow-up 4 months for Annual Physical + labs sooner if needed      Relevant Medications   traZODone (DESYREL) 100 MG tablet   History of DVT of lower extremity    Stable,  on chronic anticoagulation lifelong due to b/l DVT and recurrent PE - No new recurrence or concerns - Continue Xarelto per Hematology      Drug-induced erectile dysfunction    Consistent with likely SSRI zoloft induced side effect for ED - without other risk factors, does have history of PE/DVT on anticoagulation - no prior trial on PDE5 inhibitors  Plan: 1. Discontinue Zoloft - may resolve on it's own, otherwise, given trial on generic Sildenafil 20mg  tabs (printed to go to [facility]), take 1-5 tabs about 30 min prior to sexual activity, refills provided, counseling on risk/benefits, safety and side effects, if acute chest pain or pressure or other concerns need to go to hospital ED immediately 2. Follow-up as needed      Relevant Medications   sildenafil (REVATIO) 20 MG tablet   Depression    Suspect primary insomnia is main concern, but uncertain if clear diagnosis of depression, seems to be more in remission now, has more other symptoms less depressed mood PHQ higher in past, now improved Had been on Zoloft SSRI but failed due to impotence side effect  Plan: 1. Discontinue Zoloft - taper over 2-3 weeks since was on dose up to 150mg , instructions per AVS 2. Start new med for insomnia / mood Trazodone 100mg  tabs - cut in half for 50mg  nightly then titrate up as advised to  full dose, may take few weeks for full effect 3. Remain off SSRI future can consider alternative SSRI if needed as adjunct find option with less sexual side effect, maybe Lexapro 4. Advised to limit alcohol intake, since he seems to be self medicating in a way, should help sleep as well 5. Handout given for sleep hygiene 6. Follow-up 4 months for Annual Physical + labs sooner if needed      Relevant Medications   traZODone (DESYREL) 100 MG tablet   Chronic anticoagulation    Stable without bleeding or complication. Continue Xarelto 20mg  daily for lifelong anticoagulation with history of b/l DVT and recurrent PE in 2016 Followed by Hematology         Meds ordered this encounter  Medications  . traZODone (DESYREL) 100 MG tablet    Sig: Take 0.5-1 tablets (50-100 mg total) by mouth at bedtime.    Dispense:  30 tablet    Refill:  5  . DISCONTD: sildenafil (REVATIO) 20 MG tablet    Sig: Take 1-5 pills about 30 min prior to sex. Start with 1 and increase as needed.    Dispense:  30 tablet    Refill:  2  . sildenafil (REVATIO) 20 MG tablet    Sig: Take 1-5 pills about 30 min prior to sex. Start with 1 and increase as needed.    Dispense:  30 tablet    Refill:  2     Follow up plan: Return in about 4 months (around 05/11/2017) for Annual Physical.  Saralyn Pilar, DO [facility] 12/28/2016, 12:42 AM

## 2016-12-27 NOTE — Patient Instructions (Addendum)
Thank you for coming to the clinic today.  I think the Zoloft is not helping your sleep, I am not convinced you have Depression or Anxiety causing sleep, it sounds like a  Primary Insomnia disorder.  TAPER off the Zoloft:  1. For 1 week take alternating days - 100mg  one day then next day half tablet or 50mg  2. Week 2 - take half tablet 50mg  DAILY 3. Week 3 - START TRAZODONE new HALF pill for dose 50mg  NIGHTLY - Also Take half tablet 50mg  every OTHER day then STOP completely  Risk on the Trazodone you may get Priapsim with an erectile lasting >4 hours, need to be seen more immediately at hospital ED  I do think if you can gradually reduce the alcohol intake this may help your sleep.  I think the Zoloft is causing your erectile dysfunction as side effect, this may take several days to weeks to resolve if this was the cause.  I will still print the new Sildenafil rx generic viagra  Sildenafil 20mg  tabs, take 1-5 tabs about 30 min prior to sexual activity, refills provided  Discount generic Sildenafil pills $1 [facility]   Address: [address], [place], [place] [address] Hours: 8:30AM-6:30PM Phone: [phone]  -----------------  Sleep Hygiene Recommendations to promote healthy sleep in all patients, especially if symptoms of insomnia are worsening. Due to the nature of sleep rhythms, if your body gets "out of rhythm", it may take some time before your sleep cycle can be "reset".  Please try to follow as many of the following tips as you can, usually there are only a few of these are the primary cause of the problem.  ?To reset your sleep rhythm, go to bed and get up at the same time every day ?Sleep only long enough to feel rested and then get out of bed ?Do not try to force yourself to sleep. If you can't sleep, get out of bed and try again later.  ?Have coffee, tea, and other foods that have caffeine only in the morning ?Exercise several days a week, but not  right before bed ?If you drink alcohol, prefer to have appropriate drink with one meal, but prefer to avoid alcohol in the evening, and bedtime ?If you smoke, avoid smoking, especially in the evening  ?Avoid watching TV or looking at phones, computers, or reading devices ("e-books") that give off light at least 30 minutes before bed. This artificial light sends "awake signals" to your brain and can make it harder to fall asleep. ?Keep your bedroom dark, cool, quiet, and free of reminders of other things that cause you stress ?Try your best to solve or at least address your problems before you go to bed   Please schedule a Follow-up Appointment to: Return in about 4 months (around 05/11/2017) for Annual Physical.  If you have any other questions or concerns, please feel free to call the clinic or send a message through MyChart. You may also schedule an earlier appointment if necessary.  Saralyn PilarAlexander Karamalegos, DO [facility]

## 2016-12-28 ENCOUNTER — Other Ambulatory Visit: Payer: Self-pay | Admitting: Family Medicine

## 2016-12-28 ENCOUNTER — Encounter: Payer: Self-pay | Admitting: Family Medicine

## 2016-12-28 DIAGNOSIS — Z7901 Long term (current) use of anticoagulants: Secondary | ICD-10-CM | POA: Insufficient documentation

## 2016-12-28 DIAGNOSIS — N522 Drug-induced erectile dysfunction: Secondary | ICD-10-CM | POA: Insufficient documentation

## 2016-12-28 NOTE — Assessment & Plan Note (Addendum)
Consistent with likely SSRI zoloft induced side effect for ED - without other risk factors, does have history of PE/DVT on anticoagulation - no prior trial on PDE5 inhibitors  Plan: 1. Discontinue Zoloft - may resolve on it's own, otherwise, given trial on generic Sildenafil 20mg  tabs ([facility]), take 1-5 tabs about 30 min prior to sexual activity, refills provided, counseling on risk/benefits, safety and side effects, if acute chest pain or pressure or other concerns need to go to hospital ED immediately 2. Follow-up as needed

## 2016-12-28 NOTE — Assessment & Plan Note (Signed)
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 

## 2016-12-28 NOTE — Assessment & Plan Note (Addendum)
Suspect primary insomnia is main concern, but uncertain if clear diagnosis of depression, seems to be more in remission now, has more other symptoms less depressed mood PHQ higher in past, now improved Had been on Zoloft SSRI but failed due to impotence side effect  Plan: 1. Discontinue Zoloft - taper over 2-3 weeks since was on dose up to 150mg , instructions per AVS 2. Start new med for insomnia / mood Trazodone 100mg  tabs - cut in half for 50mg  nightly then titrate up as advised to full dose, may take few weeks for full effect. Counseling on potential side effects including priapism 3. Remain off SSRI future can consider alternative SSRI if needed as adjunct find option with less sexual side effect, maybe Lexapro 4. Advised to limit alcohol intake, since he seems to be self medicating in a way, should help sleep as well 5. Handout given for sleep hygiene 6. Follow-up 4 months for Annual Physical + labs sooner if needed

## 2016-12-28 NOTE — Assessment & Plan Note (Signed)
Suspect primary insomnia is main concern, but uncertain if clear diagnosis of depression, seems to be more in remission now, has more other symptoms less depressed mood PHQ higher in past, now improved Had been on Zoloft SSRI but failed due to impotence side effect  Plan: 1. Discontinue Zoloft - taper over 2-3 weeks since was on dose up to 150mg , instructions per AVS 2. Start new med for insomnia / mood Trazodone 100mg  tabs - cut in half for 50mg  nightly then titrate up as advised to full dose, may take few weeks for full effect 3. Remain off SSRI future can consider alternative SSRI if needed as adjunct find option with less sexual side effect, maybe Lexapro 4. Advised to limit alcohol intake, since he seems to be self medicating in a way, should help sleep as well 5. Handout given for sleep hygiene 6. Follow-up 4 months for Annual Physical + labs sooner if needed

## 2016-12-28 NOTE — Assessment & Plan Note (Signed)
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

## 2016-12-28 NOTE — Assessment & Plan Note (Signed)
Stable without bleeding or complication. Continue Xarelto 20mg  daily for lifelong anticoagulation with history of b/l DVT and recurrent PE in 2016 Followed by Hematology

## 2017-01-09 ENCOUNTER — Other Ambulatory Visit: Payer: Self-pay

## 2017-01-09 DIAGNOSIS — E785 Hyperlipidemia, unspecified: Secondary | ICD-10-CM

## 2017-01-09 DIAGNOSIS — I10 Essential (primary) hypertension: Secondary | ICD-10-CM

## 2017-01-09 MED ORDER — ATORVASTATIN CALCIUM 40 MG PO TABS: 40.0000 mg | ORAL_TABLET | Freq: Every day | ORAL | 1 refills | Status: DC

## 2017-01-09 MED ORDER — METOPROLOL SUCCINATE ER 25 MG PO TB24: 25.0000 mg | ORAL_TABLET | Freq: Every day | ORAL | 1 refills | Status: DC

## 2017-02-06 ENCOUNTER — Telehealth: Payer: Self-pay | Admitting: Family Medicine

## 2017-02-06 NOTE — Telephone Encounter (Signed)
Called pt to schedule Annual Wellness Visit with [facility]  - knb 10/16 labs and then AWV?

## 2017-03-05 ENCOUNTER — Encounter: Payer: Self-pay | Admitting: Family Medicine

## 2017-03-05 ENCOUNTER — Other Ambulatory Visit: Payer: Self-pay

## 2017-03-05 DIAGNOSIS — I10 Essential (primary) hypertension: Secondary | ICD-10-CM

## 2017-03-05 MED ORDER — LOSARTAN POTASSIUM 100 MG PO TABS: ORAL_TABLET | ORAL | 0 refills | Status: DC

## 2017-04-12 ENCOUNTER — Encounter: Payer: Self-pay | Admitting: Family Medicine

## 2017-04-12 ENCOUNTER — Other Ambulatory Visit: Payer: Self-pay

## 2017-04-12 DIAGNOSIS — I10 Essential (primary) hypertension: Secondary | ICD-10-CM

## 2017-04-13 ENCOUNTER — Other Ambulatory Visit: Payer: Self-pay

## 2017-04-13 MED ORDER — AMLODIPINE BESYLATE 5 MG PO TABS: 5.0000 mg | ORAL_TABLET | Freq: Every day | ORAL | 3 refills | Status: DC

## 2017-05-02 LAB — CBC WITH DIFFERENTIAL/PLATELET
BASOS ABS: 0 10*3/uL (ref 0.0–0.2)
BASOS: 0 %
EOS (ABSOLUTE): 0.1 10*3/uL (ref 0.0–0.4)
Eos: 1 %
Hematocrit: 39.8 % (ref 37.5–51.0)
Hemoglobin: 13.3 g/dL (ref 13.0–17.7)
IMMATURE GRANULOCYTES: 0 %
Immature Grans (Abs): 0 10*3/uL (ref 0.0–0.1)
Lymphocytes Absolute: 4 10*3/uL — ABNORMAL HIGH (ref 0.7–3.1)
Lymphs: 48 %
MCH: 35.1 pg — ABNORMAL HIGH (ref 26.6–33.0)
MCHC: 33.4 g/dL (ref 31.5–35.7)
MCV: 105 fL — AB (ref 79–97)
MONOS ABS: 1.1 10*3/uL — AB (ref 0.1–0.9)
Monocytes: 13 %
NEUTROS PCT: 38 %
Neutrophils Absolute: 3.2 10*3/uL (ref 1.4–7.0)
PLATELETS: 276 10*3/uL (ref 150–379)
RBC: 3.79 x10E6/uL — ABNORMAL LOW (ref 4.14–5.80)
RDW: 15.5 % — AB (ref 12.3–15.4)
WBC: 8.5 10*3/uL (ref 3.4–10.8)

## 2017-05-02 LAB — COMPREHENSIVE METABOLIC PANEL
ALBUMIN: 3.9 g/dL (ref 3.5–4.8)
ALK PHOS: 77 IU/L (ref 39–117)
ALT: 14 IU/L (ref 0–44)
AST: 29 IU/L (ref 0–40)
Albumin/Globulin Ratio: 1.6 (ref 1.2–2.2)
BILIRUBIN TOTAL: 0.6 mg/dL (ref 0.0–1.2)
BUN / CREAT RATIO: 14 (ref 10–24)
BUN: 12 mg/dL (ref 8–27)
CHLORIDE: 105 mmol/L (ref 96–106)
CO2: 23 mmol/L (ref 20–29)
Calcium: 9.2 mg/dL (ref 8.6–10.2)
Creatinine, Ser: 0.88 mg/dL (ref 0.76–1.27)
GFR calc Af Amer: 97 mL/min/{1.73_m2} (ref >59)
GFR calc non Af Amer: 84 mL/min/{1.73_m2} (ref >59)
GLOBULIN, TOTAL: 2.4 g/dL (ref 1.5–4.5)
GLUCOSE: 104 mg/dL — AB (ref 65–99)
Potassium: 4.1 mmol/L (ref 3.5–5.2)
SODIUM: 142 mmol/L (ref 134–144)
Total Protein: 6.3 g/dL (ref 6.0–8.5)

## 2017-05-02 LAB — LIPID PANEL
CHOL/HDL RATIO: 1.6 ratio (ref 0.0–5.0)
Cholesterol, Total: 154 mg/dL (ref 100–199)
HDL: 98 mg/dL (ref >39)
LDL CALC: 39 mg/dL (ref 0–99)
TRIGLYCERIDES: 83 mg/dL (ref 0–149)
VLDL Cholesterol Cal: 17 mg/dL (ref 5–40)

## 2017-05-02 LAB — HEMOGLOBIN A1C
Est. average glucose Bld gHb Est-mCnc: 108 mg/dL
HEMOGLOBIN A1C: 5.4 % (ref 4.8–5.6)

## 2017-05-02 LAB — PSA: Prostate Specific Ag, Serum: 1.3 ng/mL (ref 0.0–4.0)

## 2017-05-08 ENCOUNTER — Ambulatory Visit (INDEPENDENT_AMBULATORY_CARE_PROVIDER_SITE_OTHER): Payer: Medicare Other

## 2017-05-08 VITALS — BP 140/82 | HR 60 | Temp 98.5°F | Resp 17 | Ht 76.0 in | Wt 276.6 lb

## 2017-05-08 DIAGNOSIS — Z0000 Encounter for general adult medical examination without abnormal findings: Secondary | ICD-10-CM

## 2017-05-08 DIAGNOSIS — Z23 Encounter for immunization: Secondary | ICD-10-CM | POA: Diagnosis not present

## 2017-05-08 NOTE — Patient Instructions (Addendum)
Craig [name] , Thank you for taking time to come for your Medicare Wellness Visit. I appreciate your ongoing commitment to your health goals. Please review the following plan we discussed and let me know if I can assist you in the future.   Screening recommendations/referrals: Colonoscopy: completed 12/27/2010 Recommended yearly ophthalmology/optometry visit for glaucoma screening and checkup Recommended yearly dental visit for hygiene and checkup  Vaccinations: Influenza vaccine: done today  Pneumococcal vaccine: up to date Tdap vaccine: up to date Shingles vaccine: due, check with your insurance company for coverage    Advanced directives: Please bring a copy of your health care power of attorney and living will to the office at your convenience.  Conditions/risks identified: Recommend drinking at least 5-6 glasses of water a day  Next appointment: Follow up on 05/17/2017 at 10:00 am with Dr.Karamlegos. Follow up in one year for your annual wellness exam.  Preventive Care 65 Years and Older, Male Preventive care refers to lifestyle choices and visits with your health care provider that can promote health and wellness. What does preventive care include?  A yearly physical exam. This is also called an annual well check.  Dental exams once or twice a year.  Routine eye exams. Ask your health care provider how often you should have your eyes checked.  Personal lifestyle choices, including:  Daily care of your teeth and gums.  Regular physical activity.  Eating a healthy diet.  Avoiding tobacco and drug use.  Limiting alcohol use.  Practicing safe sex.  Taking low doses of aspirin every day.  Taking vitamin and mineral supplements as recommended by your health care provider. What happens during an annual well check? The services and screenings done by your health care provider during your annual well check will depend on your age, overall health, lifestyle risk factors, and  family history of disease. Counseling  Your health care provider may ask you questions about your:  Alcohol use.  Tobacco use.  Drug use.  Emotional well-being.  Home and relationship well-being.  Sexual activity.  Eating habits.  History of falls.  Memory and ability to understand (cognition).  Work and work Astronomer. Screening  You may have the following tests or measurements:  Height, weight, and BMI.  Blood pressure.  Lipid and cholesterol levels. These may be checked every 5 years, or more frequently if you are over 29 years old.  Skin check.  Lung cancer screening. You may have this screening every year starting at age 45 if you have a 30-pack-year history of smoking and currently smoke or have quit within the past 15 years.  Fecal occult blood test (FOBT) of the stool. You may have this test every year starting at age 54.  Flexible sigmoidoscopy or colonoscopy. You may have a sigmoidoscopy every 5 years or a colonoscopy every 10 years starting at age 33.  Prostate cancer screening. Recommendations will vary depending on your family history and other risks.  Hepatitis C blood test.  Hepatitis B blood test.  Sexually transmitted disease (STD) testing.  Diabetes screening. This is done by checking your blood sugar (glucose) after you have not eaten for a while (fasting). You may have this done every 1-3 years.  Abdominal aortic aneurysm (AAA) screening. You may need this if you are a current or former smoker.  Osteoporosis. You may be screened starting at age 13 if you are at high risk. Talk with your health care provider about your test results, treatment options, and if necessary, the  need for more tests. Vaccines  Your health care provider may recommend certain vaccines, such as:  Influenza vaccine. This is recommended every year.  Tetanus, diphtheria, and acellular pertussis (Tdap, Td) vaccine. You may need a Td booster every 10 years.  Zoster  vaccine. You may need this after age 68.  Pneumococcal 13-valent conjugate (PCV13) vaccine. One dose is recommended after age 39.  Pneumococcal polysaccharide (PPSV23) vaccine. One dose is recommended after age 74. Talk to your health care provider about which screenings and vaccines you need and how often you need them. This information is not intended to replace advice given to you by your health care provider. Make sure you discuss any questions you have with your health care provider. Document Released: 08/06/2015 Document Revised: 03/29/2016 Document Reviewed: 05/11/2015 Elsevier Interactive Patient Education  2017 [place] Prevention in the Home Falls can cause injuries. They can happen to people of all ages. There are many things you can do to make your home safe and to help prevent falls. What can I do on the outside of my home?  Regularly fix the edges of walkways and driveways and fix any cracks.  Remove anything that might make you trip as you walk through a door, such as a raised step or threshold.  Trim any bushes or trees on the path to your home.  Use bright outdoor lighting.  Clear any walking paths of anything that might make someone trip, such as rocks or tools.  Regularly check to see if handrails are loose or broken. Make sure that both sides of any steps have handrails.  Any raised decks and porches should have guardrails on the edges.  Have any leaves, snow, or ice cleared regularly.  Use sand or salt on walking paths during winter.  Clean up any spills in your garage right away. This includes oil or grease spills. What can I do in the bathroom?  Use night lights.  Install grab bars by the toilet and in the tub and shower. Do not use towel bars as grab bars.  Use non-skid mats or decals in the tub or shower.  If you need to sit down in the shower, use a plastic, non-slip stool.  Keep the floor dry. Clean up any water that spills on the  floor as soon as it happens.  Remove soap buildup in the tub or shower regularly.  Attach bath mats securely with double-sided non-slip rug tape.  Do not have throw rugs and other things on the floor that can make you trip. What can I do in the bedroom?  Use night lights.  Make sure that you have a light by your bed that is easy to reach.  Do not use any sheets or blankets that are too big for your bed. They should not hang down onto the floor.  Have a firm chair that has side arms. You can use this for support while you get dressed.  Do not have throw rugs and other things on the floor that can make you trip. What can I do in the kitchen?  Clean up any spills right away.  Avoid walking on wet floors.  Keep items that you use a lot in easy-to-reach places.  If you need to reach something above you, use a strong step stool that has a grab bar.  Keep electrical cords out of the way.  Do not use floor polish or wax that makes floors slippery. If you must use wax,  use non-skid floor wax.  Do not have throw rugs and other things on the floor that can make you trip. What can I do with my stairs?  Do not leave any items on the stairs.  Make sure that there are handrails on both sides of the stairs and use them. Fix handrails that are broken or loose. Make sure that handrails are as long as the stairways.  Check any carpeting to make sure that it is firmly attached to the stairs. Fix any carpet that is loose or worn.  Avoid having throw rugs at the top or bottom of the stairs. If you do have throw rugs, attach them to the floor with carpet tape.  Make sure that you have a light switch at the top of the stairs and the bottom of the stairs. If you do not have them, ask someone to add them for you. What else can I do to help prevent falls?  Wear shoes that:  Do not have high heels.  Have rubber bottoms.  Are comfortable and fit you well.  Are closed at the toe. Do not wear  sandals.  If you use a stepladder:  Make sure that it is fully opened. Do not climb a closed stepladder.  Make sure that both sides of the stepladder are locked into place.  Ask someone to hold it for you, if possible.  Clearly mark and make sure that you can see:  Any grab bars or handrails.  First and last steps.  Where the edge of each step is.  Use tools that help you move around (mobility aids) if they are needed. These include:  Canes.  Walkers.  Scooters.  Crutches.  Turn on the lights when you go into a dark area. Replace any light bulbs as soon as they burn out.  Set up your furniture so you have a clear path. Avoid moving your furniture around.  If any of your floors are uneven, fix them.  If there are any pets around you, be aware of where they are.  Review your medicines with your doctor. Some medicines can make you feel dizzy. This can increase your chance of falling. Ask your doctor what other things that you can do to help prevent falls. This information is not intended to replace advice given to you by your health care provider. Make sure you discuss any questions you have with your health care provider. Document Released: 05/06/2009 Document Revised: 12/16/2015 Document Reviewed: 08/14/2014 Elsevier Interactive Patient Education  2017 Elsevier Inc.  Influenza (Flu) Vaccine (Inactivated or Recombinant): What You Need to Know 1. Why get vaccinated? Influenza ("flu") is a contagious disease that spreads around the [place] every year, usually between October and May. Flu is caused by influenza viruses, and is spread mainly by coughing, sneezing, and close contact. Anyone can get flu. Flu strikes suddenly and can last several days. Symptoms vary by age, but can include:  fever/chills  sore throat  muscle aches  fatigue  cough  headache  runny or stuffy nose  Flu can also lead to pneumonia and blood infections, and cause diarrhea and  seizures in children. If you have a medical condition, such as heart or lung disease, flu can make it worse. Flu is more dangerous for some people. Infants and young children, people 20 years of age and older, pregnant women, and people with certain health conditions or a weakened immune system are at greatest risk. Each year thousands of people in the  [place] die from flu, and many more are hospitalized. Flu vaccine can:  keep you from getting flu,  make flu less severe if you do get it, and  keep you from spreading flu to your family and other people. 2. Inactivated and recombinant flu vaccines A dose of flu vaccine is recommended every flu season. Children 6 months through 45 years of age may need two doses during the same flu season. Everyone else needs only one dose each flu season. Some inactivated flu vaccines contain a very small amount of a mercury-based preservative called thimerosal. Studies have not shown thimerosal in vaccines to be harmful, but flu vaccines that do not contain thimerosal are available. There is no live flu virus in flu shots. They cannot cause the flu. There are many flu viruses, and they are always changing. Each year a new flu vaccine is made to protect against three or four viruses that are likely to cause disease in the upcoming flu season. But even when the vaccine doesn't exactly match these viruses, it may still provide some protection. Flu vaccine cannot prevent:  flu that is caused by a virus not covered by the vaccine, or  illnesses that look like flu but are not.  It takes about 2 weeks for protection to develop after vaccination, and protection lasts through the flu season. 3. Some people should not get this vaccine Tell the person who is giving you the vaccine:  If you have any severe, life-threatening allergies. If you ever had a life-threatening allergic reaction after a dose of flu vaccine, or have a severe allergy to any part of this  vaccine, you may be advised not to get vaccinated. Most, but not all, types of flu vaccine contain a small amount of egg protein.  If you ever had Guillain-Barr Syndrome (also called GBS). Some people with a history of GBS should not get this vaccine. This should be discussed with your doctor.  If you are not feeling well. It is usually okay to get flu vaccine when you have a mild illness, but you might be asked to come back when you feel better.  4. Risks of a vaccine reaction With any medicine, including vaccines, there is a chance of reactions. These are usually mild and go away on their own, but serious reactions are also possible. Most people who get a flu shot do not have any problems with it. Minor problems following a flu shot include:  soreness, redness, or swelling where the shot was given  hoarseness  sore, red or itchy eyes  cough  fever  aches  headache  itching  fatigue  If these problems occur, they usually begin soon after the shot and last 1 or 2 days. More serious problems following a flu shot can include the following:  There may be a small increased risk of Guillain-Barre Syndrome (GBS) after inactivated flu vaccine. This risk has been estimated at 1 or 2 additional cases per million people vaccinated. This is much lower than the risk of severe complications from flu, which can be prevented by flu vaccine.  Young children who get the flu shot along with pneumococcal vaccine (PCV13) and/or DTaP vaccine at the same time might be slightly more likely to have a seizure caused by fever. Ask your doctor for more information. Tell your doctor if a child who is getting flu vaccine has ever had a seizure.  Problems that could happen after any injected vaccine:  People sometimes faint after a  medical procedure, including vaccination. Sitting or lying down for about 15 minutes can help prevent fainting, and injuries caused by a fall. Tell your doctor if you feel dizzy,  or have vision changes or ringing in the ears.  Some people get severe pain in the shoulder and have difficulty moving the arm where a shot was given. This happens very rarely.  Any medication can cause a severe allergic reaction. Such reactions from a vaccine are very rare, estimated at about 1 in a million doses, and would happen within a few minutes to a few hours after the vaccination. As with any medicine, there is a very remote chance of a vaccine causing a serious injury or death. The safety of vaccines is always being monitored. For more information, visit: http://floyd.org/ 5. What if there is a serious reaction? What should I look for? Look for anything that concerns you, such as signs of a severe allergic reaction, very high fever, or unusual behavior. Signs of a severe allergic reaction can include hives, swelling of the face and throat, difficulty breathing, a fast heartbeat, dizziness, and weakness. These would start a few minutes to a few hours after the vaccination. What should I do?  If you think it is a severe allergic reaction or other emergency that can't wait, call 9-1-1 and get the person to the nearest hospital. Otherwise, call your doctor.  Reactions should be reported to the Vaccine Adverse Event Reporting System (VAERS). Your doctor should file this report, or you can do it yourself through the VAERS web site at www.vaers.LAgents.no, or by calling 1-(803) 247-7318. ? VAERS does not give medical advice. 6. The National Vaccine Injury Compensation Program The [employer] Vaccine Injury Compensation Program (VICP) is a federal program that was created to compensate people who may have been injured by certain vaccines. Persons who believe they may have been injured by a vaccine can learn about the program and about filing a claim by calling 1-(315)446-4342 or visiting the VICP website at SpiritualWord.at. There is a time limit to file a claim for  compensation. 7. How can I learn more?  Ask your healthcare provider. He or she can give you the vaccine package insert or suggest other sources of information.  Call your local or state health department.  Contact the Centers for Disease Control and Prevention (CDC): ? Call 207 592 3997 (1-800-CDC-INFO) or ? Visit CDC's website at BiotechRoom.com.cy Vaccine Information Statement, Inactivated Influenza Vaccine (02/27/2014) This information is not intended to replace advice given to you by your health care provider. Make sure you discuss any questions you have with your health care provider. Document Released: 05/04/2006 Document Revised: 03/30/2016 Document Reviewed: 03/30/2016 Elsevier Interactive Patient Education  2017 [employer].

## 2017-05-08 NOTE — Progress Notes (Signed)
Subjective:   Craig [name] is a 76 y.o. male who presents for Medicare Annual/Subsequent preventive examination.  Review of Systems:  Cardiac Risk Factors include: male gender;obesity (BMI >30kg/m2);dyslipidemia;hypertension     Objective:    Vitals: BP 140/82 (BP Location: Left Arm, Patient Position: Sitting)   Pulse 60   Temp 98.5 F (36.9 C) (Oral)   Resp 17   Ht  (1.93 m)   Wt 276 lb 9.6 oz (125.5 kg)   BMI 33.67 kg/m   Body mass index is 33.67 kg/m.  Tobacco History  Smoking Status  . Former Smoker  . Years: 20.00  . Types: Cigarettes  . Quit date: 07/24/1978  Smokeless Tobacco  . Never Used    Comment: Quit tobacco in 1980     Counseling given: Not Answered   Past Medical History:  Diagnosis Date  . Anxiety   . Essential hypertension   . Pulmonary emboli (HCC)   . 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)    Past Surgical History:  Procedure Laterality Date  . LEFT HEART CATHETERIZATION WITH CORONARY ANGIOGRAM Bilateral 07/27/2014   Procedure: LEFT HEART CATHETERIZATION WITH CORONARY ANGIOGRAM;  Surgeon: Runell Gess, MD;  [facility] CATH LAB;  Service: Cardiovascular;  Laterality: Bilateral;  . SPLENECTOMY, TOTAL     TTP   Family History  Problem Relation Age of Onset  . CAD Father 61  . Kidney failure Mother   . CAD Brother    History  Sexual Activity  . Sexual activity: Yes  . Birth control/ protection: None    Outpatient Encounter Prescriptions as of 05/08/2017  Medication Sig  . amLODipine (NORVASC) 5 MG tablet Take 1 tablet (5 mg total) by mouth daily.  Marland Kitchen atorvastatin (LIPITOR) 40 MG tablet Take 1 tablet (40 mg total) by mouth daily at 6 PM.  . cholecalciferol (VITAMIN D) 1000 UNITS tablet Take 1,000 Units by mouth daily.  Marland Kitchen losartan (COZAAR) 100 MG tablet take 1 tablet by mouth once daily for blood pressure  . Melatonin 5 MG TABS Take 10 mg by mouth at bedtime. 3 tabs  . metoprolol succinate (TOPROL-XL) 25 MG 24 hr tablet Take 1 tablet (25 mg  total) by mouth daily.  Marland Kitchen omeprazole (PRILOSEC) 40 MG capsule take 1 capsule by mouth once daily  . rivaroxaban (XARELTO) 20 MG TABS tablet Take 1 tablet (20 mg total) by mouth daily with supper.  . vitamin B-12 (CYANOCOBALAMIN) 500 MCG tablet Take 500 mcg by mouth daily.  . vitamin E 1000 UNIT capsule Take 1,000 Units by mouth daily.  . sildenafil (REVATIO) 20 MG tablet Take 1-5 pills about 30 min prior to sex. Start with 1 and increase as needed. (Patient not taking: Reported on 05/08/2017)  . [DISCONTINUED] traZODone (DESYREL) 100 MG tablet Take 0.5-1 tablets (50-100 mg total) by mouth at bedtime. (Patient not taking: Reported on 05/08/2017)   No facility-administered encounter medications on file as of 05/08/2017.     Activities of Daily Living In your present state of health, do you have any difficulty performing the following activities: 05/08/2017 09/28/2016  Hearing? N N  Vision? Y Y  Difficulty concentrating or making decisions? N N  Walking or climbing stairs? Y N  Dressing or bathing? N N  Doing errands, shopping? N N  Preparing Food and eating ? N -  Using the Toilet? N -  In the past six months, have you accidently leaked urine? N -  Do you have problems with loss of bowel  control? N -  Managing your Medications? N -  Managing your Finances? N -  Housekeeping or managing your Housekeeping? N -  Some recent data might be hidden    Patient Care Team: Smitty Cords, DO as PCP - General (Family Medicine)   Assessment:     Exercise Activities and Dietary recommendations Current Exercise Habits: The patient does not participate in regular exercise at present, Exercise limited by: None identified  Goals    . Increase water intake          Recommend drinking at least 5-6 glasses of water a day      Fall Risk Fall Risk  05/08/2017 09/28/2016 03/09/2016 03/09/2016 09/23/2015  Falls in the past year? No No Yes No No  Number falls in past yr: - - 1 - -    Depression Screen PHQ 2/9 Scores 05/08/2017 05/08/2017 12/27/2016 09/28/2016  PHQ - 2 Score 1 1 0 5  PHQ- 9 Score 4 - 6 14    Cognitive Function     6CIT Screen 05/08/2017  What Year? 0 points  What month? 0 points  What time? 0 points  Count back from 20 0 points  Months in reverse 0 points  Repeat phrase 0 points  Total Score 0    Immunization History  Administered Date(s) Administered  . Influenza Split 06/02/2014  . Influenza Whole 06/02/2014  . Influenza, High Dose Seasonal PF 04/27/2015, 05/11/2016, 05/08/2017  . Influenza-Unspecified 05/06/2012, 04/27/2015  . Pneumococcal Conjugate-13 03/09/2016  . Pneumococcal Polysaccharide-23 06/02/2014  . Pneumococcal-Unspecified 06/02/2014  . Tdap 03/09/2016   Screening Tests Health Maintenance  Topic Date Due  . INFLUENZA VACCINE  02/21/2017  . COLONOSCOPY  12/26/2020  . TETANUS/TDAP  03/09/2026  . PNA vac Low Risk Adult  Completed      Plan:    I have personally reviewed and addressed the Medicare Annual Wellness questionnaire and have noted the following in the patient's chart:  A. Medical and social history B. Use of alcohol, tobacco or illicit drugs  C. Current medications and supplements D. Functional ability and status E.  Nutritional status F.  Physical activity G. Advance directives H. List of other physicians I.  Hospitalizations, surgeries, and ER visits in previous 12 months J.  Vitals K. Screenings such as hearing and vision if needed, cognitive and depression L. Referrals and appointments   In addition, I have reviewed and discussed with patient certain preventive protocols, quality metrics, and best practice recommendations. A written personalized care plan for preventive services as well as general preventive health recommendations were provided to patient.   Signed,  Marin Roberts, LPN Nurse Health Advisor   MD Recommendations: Patient had swelling in bilateral feet and face and itching with  trazodone, he stopped taking 3-4 months ago. Added to his allergy list.

## 2017-05-17 ENCOUNTER — Ambulatory Visit (INDEPENDENT_AMBULATORY_CARE_PROVIDER_SITE_OTHER): Payer: Medicare Other | Admitting: Family Medicine

## 2017-05-17 ENCOUNTER — Encounter: Payer: Self-pay | Admitting: Family Medicine

## 2017-05-17 VITALS — BP 126/58 | HR 62 | Temp 97.6°F | Resp 16 | Ht 76.0 in | Wt 270.0 lb

## 2017-05-17 DIAGNOSIS — N401 Enlarged prostate with lower urinary tract symptoms: Secondary | ICD-10-CM

## 2017-05-17 DIAGNOSIS — R0609 Other forms of dyspnea: Secondary | ICD-10-CM

## 2017-05-17 DIAGNOSIS — R600 Localized edema: Secondary | ICD-10-CM

## 2017-05-17 DIAGNOSIS — F5101 Primary insomnia: Secondary | ICD-10-CM

## 2017-05-17 DIAGNOSIS — R351 Nocturia: Secondary | ICD-10-CM | POA: Diagnosis not present

## 2017-05-17 DIAGNOSIS — N522 Drug-induced erectile dysfunction: Secondary | ICD-10-CM

## 2017-05-17 DIAGNOSIS — I10 Essential (primary) hypertension: Secondary | ICD-10-CM | POA: Diagnosis not present

## 2017-05-17 DIAGNOSIS — I2699 Other pulmonary embolism without acute cor pulmonale: Secondary | ICD-10-CM | POA: Diagnosis not present

## 2017-05-17 DIAGNOSIS — Z86718 Personal history of other venous thrombosis and embolism: Secondary | ICD-10-CM | POA: Diagnosis not present

## 2017-05-17 DIAGNOSIS — R0600 Dyspnea, unspecified: Secondary | ICD-10-CM

## 2017-05-17 DIAGNOSIS — R0601 Orthopnea: Secondary | ICD-10-CM

## 2017-05-17 DIAGNOSIS — I890 Lymphedema, not elsewhere classified: Secondary | ICD-10-CM | POA: Insufficient documentation

## 2017-05-17 NOTE — Assessment & Plan Note (Signed)
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 Continue current BP regimen Amlodipine 5mg , Losartan 100mg , Metoprolol XL 25mg  daily 2. Encourage improved lifestyle - low sodium diet, improve regular exercise 3. Continue monitor BP outside office, bring readings to next visit, if persistently >140/90 or new symptoms notify office sooner 4. Follow-up 3 months

## 2017-05-17 NOTE — Assessment & Plan Note (Signed)
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 

## 2017-05-17 NOTE — Assessment & Plan Note (Signed)
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 Remain off Zoloft and Trazodone at this time, since no relief and side effects 2. Will return to discuss this further due to time constraints today - briefly counseled him on sleep hygiene, already given handout last time. He should limit alcohol intake, reduce fluid intake in evening - we will consider other sleep agents next visit. 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

## 2017-05-17 NOTE — Assessment & Plan Note (Signed)
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 Defer treatment discussion for now - if symptoms worse, despite reducing fluid intake at night, may re-consider Flomax and other meds

## 2017-05-17 NOTE — Progress Notes (Signed)
Subjective:    Patient ID: Craig [name], male    DOB: 30-Nov-1940, 76 y.o.   MRN: [id number]  Craig [name] is a 76 y.o. male presenting on 05/17/2017 for Annual Exam; Leg Swelling; and Shortness of Breath   HPI   Here for Annual Physical and Lab Review. He has multiple medical complaints today that are new or worsening, in addition to physical.  EXERTIONAL DYSPNEA, ORTHOPNEA, PND / LE Edema - Reports gradual worsening problems >3 months, some of these symptoms present longer. Describes concern with retaining more fluid, he has had some swelling in lower extremities for long time, but now has had worsening swelling over past 3 months, mostly around ankles, lower extremity and feet. Improves some with compression, hard to wear, and some overnight with elevation, he does not elevate legs completely. He has never been on diuretic or medicine for swelling. He has varicose veins but has never seen vascular or vein specialist. - Now concern with difficulty laying flat at night, due to waking up difficulty breathing, has to sleep in recliner chair - Also endorses some worsening dyspnea with exertion, he is more sedentary now and cannot do as much activity due to this - He had problem breathing at night, tried a "breath right strip" on nose and this improved his nasal congestion and allowed him to breath better - Denies any chest pain or pressure  Insomnia, Chronic / History of Mood Disorder - Last visit with me 12/27/16, for initial visit for same problems, treated with trial off tapering Sertraline and switch to Trazodone for better insomnia control instead of chronic SSRI that he believed was not helping anymore, see prior notes for background information, specifically he states has had chronic insomnia "since age 47". - Interval update with he followed instructions, tapered off Zoloft as asked, and then transitioned to Trazodone, he got up to 100mg  nightly Trazodone but after 1 week could not tolerate  it due to significant swelling of face and feet. He stopped this medication, and states it was not helping his sleep either. - Today patient reports he is off both sertraline and trazodone now. He does not notice any difference in his mood and sleep. However PHQ score below, is elevated. - He is interested in other medications, and states previous doctor "would not prescribe Ambien" for him - He has tried OTC "Breath right" nasal strip and now he can breath better, less waking up - Has a history of regular alcohol consumption - Admits ED symptoms have resolved now that he is off Sertraline. Not using Sildenafil  Chronic Low Back Pain / DJD - Reports history of L5 disc problem DDD, he saw chiropractor and they told him he needed surgery, he has not pursued any other treatment for this.  CHRONIC COUGH - non productive, no phlegm  History of Recurrent PE/DVT, on anticoagulation lifelong - No changes or update - Continues to take Xarelto daily for anticoagulation - He recounts story again about when he was taken off of this by pulmonology after 6 months and had recurrent episode - Admits some increased temperature sensation due to medicine  History of Splenectomy - Again he reports that he had plt down to 16, and describes the story of how he saw Hematology and referred to surgeon for splenectomy, resolved his problem and has had normal platelets since. He was confused about the lab result now, question answered.   CHRONIC HTN - BP controlled, no recent changes, took meds today  NOCTURIA / Prostate Symptoms: - Patient brought up this additional concern, and but not interested to discuss it further regarding his symptomatology, did not focus on this. He has admitted to some nocturia in past, but now he has been drinking less fluids in evening, which has helped his nocturia  Health Maintenance: - UTD Flu Vaccine 05/08/17 - UTD Pneumonia vaccine - UTD TDap - Colon CA Screening: Last Colonoscopy  12/27/2010 (done by [facility]), results reported "normal" but I do not have the report available.  Depression screen [facility] 2/9 05/17/2017 05/08/2017 05/08/2017  Decreased Interest 3 1 1   Down, Depressed, Hopeless 3 0 0  PHQ - 2 Score 6 1 1   Altered sleeping 3 1 -  Tired, decreased energy 3 1 -  Change in appetite 0 1 -  Feeling bad or failure about yourself  0 0 -  Trouble concentrating 0 0 -  Moving slowly or fidgety/restless 0 0 -  Suicidal thoughts 0 0 -  PHQ-9 Score 12 4 -  Difficult doing work/chores Not difficult at all Not difficult at all -    Past Medical History:  Diagnosis Date  . Anxiety   . Essential hypertension   . Pulmonary emboli (HCC)   . 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 LEFT HEART CATHETERIZATION WITH CORONARY ANGIOGRAM Bilateral 07/27/2014   Procedure: LEFT HEART CATHETERIZATION WITH CORONARY ANGIOGRAM;  Surgeon: Runell Gess, MD;  Location: [facility] CATH LAB;  Service: Cardiovascular;  Laterality: Bilateral;  . SPLENECTOMY, TOTAL     TTP   Social History   Social History  . Marital status: Single    Spouse name: N/A  . Number of children: N/A  . Years of education: N/A   Occupational History  . Retired Water quality scientist)    Social History Main Topics  . Smoking status: Former Smoker    Years: 20.00    Types: Cigarettes    Quit date: 07/24/1978  . Smokeless tobacco: Never Used     Comment: Quit tobacco in 1980  . Alcohol use 1.8 oz/week    3 Shots of liquor per week     Comment: daily  . Drug use: No  . Sexual activity: Yes    Birth control/ protection: None   Other Topics Concern  . Not on file   Social History Narrative   Retired from [employer]. Retired x 20 years.  Two children.     Family History  Problem Relation Age of Onset  . CAD Father 70  . Kidney failure Mother   . CAD Brother    Current Outpatient Prescriptions on File Prior to Visit  Medication Sig  . amLODipine  (NORVASC) 5 MG tablet Take 1 tablet (5 mg total) by mouth daily.  Marland Kitchen atorvastatin (LIPITOR) 40 MG tablet Take 1 tablet (40 mg total) by mouth daily at 6 PM.  . cholecalciferol (VITAMIN D) 1000 UNITS tablet Take 1,000 Units by mouth daily.  Marland Kitchen losartan (COZAAR) 100 MG tablet take 1 tablet by mouth once daily for blood pressure  . Melatonin 5 MG TABS Take 10 mg by mouth at bedtime. 3 tabs  . metoprolol succinate (TOPROL-XL) 25 MG 24 hr tablet Take 1 tablet (25 mg total) by mouth daily.  Marland Kitchen omeprazole (PRILOSEC) 40 MG capsule take 1 capsule by mouth once daily  . rivaroxaban (XARELTO) 20 MG TABS tablet Take 1 tablet (20 mg total) by mouth daily with supper.  Marland Kitchen  vitamin B-12 (CYANOCOBALAMIN) 500 MCG tablet Take 500 mcg by mouth daily.  . vitamin E 1000 UNIT capsule Take 1,000 Units by mouth daily.  . sildenafil (REVATIO) 20 MG tablet Take 1-5 pills about 30 min prior to sex. Start with 1 and increase as needed. (Patient not taking: Reported on 05/08/2017)   No current facility-administered medications on file prior to visit.     Review of Systems  Constitutional: Negative for activity change, appetite change, chills, diaphoresis, fatigue, fever and unexpected weight change.  HENT: Positive for congestion. Negative for hearing loss and sinus pressure.   Eyes: Negative for visual disturbance.  Respiratory: Positive for cough and choking. Negative for apnea, chest tightness, shortness of breath and wheezing.   Cardiovascular: Negative for chest pain, palpitations and leg swelling.  Gastrointestinal: Negative for abdominal pain, anal bleeding, blood in stool, constipation, diarrhea, nausea and vomiting.  Endocrine: Negative for cold intolerance and polyuria.  Genitourinary: Negative for decreased urine volume, difficulty urinating, dysuria, frequency, hematuria, testicular pain and urgency.  Musculoskeletal: Positive for back pain. Negative for arthralgias and neck pain.  Skin: Negative for rash.    Allergic/Immunologic: Negative for environmental allergies.  Neurological: Negative for dizziness, weakness, light-headedness, numbness and headaches.  Hematological: Negative for adenopathy.  Psychiatric/Behavioral: Negative for behavioral problems, dysphoric mood and sleep disturbance. The patient is not nervous/anxious.    Per HPI unless specifically indicated above    Objective:    BP (!) 126/58   Pulse 62   Temp 97.6 F (36.4 C) (Oral)   Resp 16   Ht 6\' 4"  (1.93 m)   Wt 270 lb (122.5 kg)   BMI 32.87 kg/m   Wt Readings from Last 3 Encounters:  05/17/17 270 lb (122.5 kg)  05/08/17 276 lb 9.6 oz (125.5 kg)  12/27/16 261 lb (118.4 kg)    Physical Exam  Constitutional: He is oriented to person, place, and time. He appears well-developed and well-nourished. No distress.  Well-appearing, comfortable, cooperative  HENT:  Head: Normocephalic and atraumatic.  Mouth/Throat: Oropharynx is clear and moist.  Frontal / maxillary sinuses non-tender. Nares patent without purulence or edema. Bilateral TMs clear without erythema, effusion or bulging. Oropharynx clear without erythema, exudates, edema or asymmetry.  Mallampati Score 1 - Complete visualization of entire oropharynx soft palate  Eyes: Pupils are equal, round, and reactive to light. Conjunctivae and EOM are normal. Right eye exhibits no discharge. Left eye exhibits no discharge.  Neck: Normal range of motion. Neck supple. No thyromegaly present.  No carotid bruits  Cardiovascular: Normal rate, regular rhythm, normal heart sounds and intact distal pulses.   No murmur heard. Pulmonary/Chest: Effort normal and breath sounds normal. No respiratory distress. He has no wheezes. He has no rales.  Abdominal: Soft. Bowel sounds are normal. He exhibits no distension and no mass. There is no tenderness.  Musculoskeletal: Normal range of motion. He exhibits edema (Increased bilateral lower extremity ankle foot +2 pitting edema,  symmetrical, non tender, with varicose veins). He exhibits no tenderness.  Upper / Lower Extremities: - Normal muscle tone, strength bilateral upper extremities 5/5, lower extremities 5/5  Lymphadenopathy:    He has no cervical adenopathy.  Neurological: He is alert and oriented to person, place, and time.  Distal sensation intact to light touch all extremities  Skin: Skin is warm and dry. No rash noted. He is not diaphoretic. No erythema.  Psychiatric: He has a normal mood and affect. His behavior is normal.  Well groomed, good eye contact, normal  speech and thoughts. Good insight into health. Does not appear anxious.  Nursing note and vitals reviewed.    I have personally reviewed the radiology report from 01/15/15 ECHOCardiogram                 *Cardiovascular Imaging at [facility]                  [address], [address]                        [place], [place] [address]                            [phone]  ------------------------------------------------------------------- Transthoracic Echocardiography  Patient:    Zebedee, Segundo MR #:       [id number] Study Date: 01/15/2015 Gender:     M Age:        76 Height:     190.5 cm Weight:     121.6 kg BSA:        2.57 m^2 Pt. Status: Room:   ATTENDING    Nicki Guadalajara, M.D.  ORDERING     Izaih Hochrein  REFERRING    Breylon Hochrein  PERFORMING   [facility], Outpatient  SONOGRAPHER  NIKE, RDCS  cc:  ------------------------------------------------------------------- LV EF: 55% -   60%  ------------------------------------------------------------------- Indications:     Pulmonary Embolus with Infarction (I26.99).  ------------------------------------------------------------------- History:   PMH:  Pulmonary Embolism, Deep Vein Thrombosis, Edema, Thrombotic thrombocytopenic purpura (TTP Syndrome)  Dyspnea.  Risk factors:  Hypertension.  Dyslipidemia.  ------------------------------------------------------------------- Study Conclusions  - Left ventricle: The cavity size was normal. Wall thickness was   normal. Systolic function was normal. The estimated ejection   fraction was in the range of 55% to 60%. Wall motion was normal;   there were no regional wall motion abnormalities. Features are   consistent with a pseudonormal left ventricular filling pattern,   with concomitant abnormal relaxation and increased filling   pressure (grade 2 diastolic dysfunction). - Left atrium: The atrium was moderately dilated. - Right atrium: The atrium was mildly dilated.  Results for orders placed or performed in visit on 12/27/16  Lipid panel  Result Value Ref Range   Cholesterol, Total 154 100 - 199 mg/dL   Triglycerides 83 0 - 149 mg/dL   HDL 98 >21 mg/dL   VLDL Cholesterol Cal 17 5 - 40 mg/dL   LDL Calculated 39 0 - 99 mg/dL   Chol/HDL Ratio 1.6 0.0 - 5.0 ratio  CBC with Differential/Platelet  Result Value Ref Range   WBC 8.5 3.4 - 10.8 x10E3/uL   RBC 3.79 (L) 4.14 - 5.80 x10E6/uL   Hemoglobin 13.3 13.0 - 17.7 g/dL   Hematocrit 30.8 65.7 - 51.0 %   MCV 105 (H) 79 - 97 fL   MCH 35.1 (H) 26.6 - 33.0 pg   MCHC 33.4 31.5 - 35.7 g/dL   RDW 84.6 (H) 96.2 - 95.2 %   Platelets 276 150 - 379 x10E3/uL   Neutrophils 38 Not Estab. %   Lymphs 48 Not Estab. %   Monocytes 13 Not Estab. %   Eos 1 Not Estab. %   Basos 0 Not Estab. %   Neutrophils Absolute 3.2 1.4 - 7.0 x10E3/uL   Lymphocytes Absolute 4.0 (H) 0.7 - 3.1 x10E3/uL   Monocytes Absolute 1.1 (H) 0.1 - 0.9 x10E3/uL   EOS (ABSOLUTE) 0.1 0.0 - 0.4 x10E3/uL  Basophils Absolute 0.0 0.0 - 0.2 x10E3/uL   Immature Granulocytes 0 Not Estab. %   Immature Grans (Abs) 0.0 0.0 - 0.1 x10E3/uL  Hemoglobin A1c  Result Value Ref Range   Hgb A1c MFr Bld 5.4 4.8 - 5.6 %   Est. average glucose Bld gHb Est-mCnc 108 mg/dL  Comprehensive metabolic panel  Result Value Ref Range    Glucose 104 (H) 65 - 99 mg/dL   BUN 12 8 - 27 mg/dL   Creatinine, Ser 1.61 0.76 - 1.27 mg/dL   GFR calc non Af Amer 84 >59 mL/min/1.73   GFR calc Af Amer 97 >59 mL/min/1.73   BUN/Creatinine Ratio 14 10 - 24   Sodium 142 134 - 144 mmol/L   Potassium 4.1 3.5 - 5.2 mmol/L   Chloride 105 96 - 106 mmol/L   CO2 23 20 - 29 mmol/L   Calcium 9.2 8.6 - 10.2 mg/dL   Total Protein 6.3 6.0 - 8.5 g/dL   Albumin 3.9 3.5 - 4.8 g/dL   Globulin, Total 2.4 1.5 - 4.5 g/dL   Albumin/Globulin Ratio 1.6 1.2 - 2.2   Bilirubin Total 0.6 0.0 - 1.2 mg/dL   Alkaline Phosphatase 77 39 - 117 IU/L   AST 29 0 - 40 IU/L   ALT 14 0 - 44 IU/L  PSA  Result Value Ref Range   Prostate Specific Ag, Serum 1.3 0.0 - 4.0 ng/mL      Assessment & Plan:   Problem List Items Addressed This Visit    Bilateral lower extremity edema - Primary    Gradual worsening chronic bilateral LE edema, history of varicose veins and sedentary lifestyle, overweight. Now concerns with worsening other symptoms dyspnea on exertion and orthopnea. Concern for possible cardiac etiology - Known Grade 2 diastolic dysfunction, last ECHO 2016  Plan: 1. Order repeat ECHOcardiogram 2. Referral to Cardiology [facility] for review of new ECHO and follow-up discussion 3. Ultimately discussed that common cause can be venous insufficiency, and this is more benign, and would continue compression and elevation, improve these for symptom control, avoid lasix for now unless concern with CHF from Cardiology, may need vascular referral if swelling worse and uncomfortable or symptomatic      Relevant Orders   ECHOCARDIOGRAM COMPLETE   Ambulatory referral to Cardiology   Drug-induced erectile dysfunction    Resovled off SSRI Zoloft May stop Sildenafil      Essential hypertension (Chronic)    Well-controlled HTN - Home BP readings normal  Complication with h/o Grade 2 diastolic dysfunction   Plan:  1. Continue current BP regimen Amlodipine 5mg ,  Losartan 100mg , Metoprolol XL 25mg  daily 2. Encourage improved lifestyle - low sodium diet, improve regular exercise 3. Continue monitor BP outside office, bring readings to next visit, if persistently >140/90 or new symptoms notify office sooner 4. 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 Remain off Zoloft and Trazodone at this time, since no relief and side effects 2. Will return to discuss this further due to time constraints today - briefly counseled him on sleep hygiene,  already given handout last time. He should limit alcohol intake, reduce fluid intake in evening - we will consider other sleep agents next visit. I did recommend that if not improved given chronic insomnia for >60 years, he should strongly consider a sleep study and referral to sleep specialist if needed      Nocturia associated with benign prostatic hyperplasia    Stable chronic BPH with only minimal to mild symptoms - did not fully address AUA BPH score today due to time and patient request - Prior trial on OTC Saw Palmetto, never on flomax - Last PSA 1.3 (04/2017), prior stable PSA around, without abnormal - No known personal/family history of prostate CA  Plan: 1. Defer treatment discussion for now - if symptoms worse, despite reducing fluid intake at night, may re-consider Flomax and other meds      Recurrent pulmonary emboli (HCC)    Stable, on chronic anticoagulation lifelong due to b/l DVT and recurrent PE - No new recurrence or concerns - Continue Xarelto per Hematology       Other Visit Diagnoses    Orthopnea        Relevant Orders   ECHOCARDIOGRAM COMPLETE   Ambulatory referral to Cardiology   Dyspnea on exertion       Relevant Orders   ECHOCARDIOGRAM COMPLETE   Ambulatory referral to Cardiology      No orders of the defined types were placed in this encounter.  Orders Placed This Encounter  Procedures  . Ambulatory referral to Cardiology    Referral Priority:   Routine    Referral Type:   Consultation    Referral Reason:   Specialty Services Required    Referred to Provider:   Antonieta IbaGollan, Timothy J, MD    Requested Specialty:   Cardiology    Number of Visits Requested:   1  . ECHOCARDIOGRAM COMPLETE    Standing Status:   Future    Standing Expiration Date:   08/17/2018    Order Specific Question:   Where should this test be performed    Answer:   [facility]    Order Specific Question:   Perflutren DEFINITY (image enhancing agent) should be administered unless hypersensitivity or allergy exist    Answer:   Administer Perflutren    Order Specific Question:   Expected Date:    Answer:   1 week    Follow up plan: Return in about 3 months (around 08/17/2017) for f/u ECHO/Cards, dyspnea/LE Edema, Insomnia.  Saralyn PilarAlexander Karamalegos, DO [facility] 05/17/2017, 2:16 PM

## 2017-05-17 NOTE — Assessment & Plan Note (Signed)
Gradual worsening chronic bilateral LE edema, history of varicose veins and sedentary lifestyle, overweight. Now concerns with worsening other symptoms dyspnea on exertion and orthopnea. Concern for possible cardiac etiology - Known Grade 2 diastolic dysfunction, last ECHO 2016  Plan: 1. Order repeat ECHOcardiogram 2. [facility] for review of new ECHO and follow-up discussion 3. Ultimately discussed that common cause can be venous insufficiency, and this is more benign, and would continue compression and elevation, improve these for symptom control, avoid lasix for now unless concern with CHF from Cardiology, may need vascular referral if swelling worse and uncomfortable or symptomatic

## 2017-05-17 NOTE — Assessment & Plan Note (Signed)
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

## 2017-05-17 NOTE — Assessment & Plan Note (Signed)
Resovled off SSRI Zoloft May stop Sildenafil

## 2017-05-17 NOTE — Patient Instructions (Addendum)
Thank you for coming to the clinic today.  1.  I have ordered an ECHOcardiogram to investigate this further.   Referral to Cardiologist - to investigate shortness of breath with activity, difficulty breathing laying down, and lower extremity edema  CARDIOLOGY  [facility] [address] [place], [place] [address] Main: [phone]   Julien Nordmannimothy Gollan, MD  2. Regarding your prostate, most likely mildly enlarged.  AUA BPH Symptom Score over past 1 month 1. Sensation of not emptying bladder post void 2. Urinate less than 2 hour after finish last void 3. Start/Stop several times during void 4. Difficult to postpone urination  5. Weak urinary stream 6. Push or strain urination 7. Nocturia - # times   Please schedule a Follow-up Appointment to: Return in about 3 months (around 08/17/2017) for f/u ECHO/Cards, dyspnea/LE Edema, Insomnia.  If you have any other questions or concerns, please feel free to call the clinic or send a message through MyChart. You may also schedule an earlier appointment if necessary.  Additionally, you may be receiving a survey about your experience at our clinic within a few days to 1 week by e-mail or mail. We value your feedback.  Saralyn PilarAlexander Karamalegos, DO [facility]

## 2017-05-18 ENCOUNTER — Telehealth: Payer: Self-pay | Admitting: Cardiology

## 2017-05-18 NOTE — Telephone Encounter (Signed)
Pt is concerned , because his PCP has ordered an echo, and he doesn't think he needs one. He states he does have some swelling in his bilateral feet and ankles. Please advise if this is necessary.

## 2017-05-18 NOTE — Telephone Encounter (Signed)
Spoke with pt he states that he does not think that he needs to have it done last ECHO 2016 G2DD EF 55-65%. He will have ECHO done and call for follow up appt to discuss with Dr Mariah MillingGollan.

## 2017-05-18 NOTE — Telephone Encounter (Signed)
OK with me.

## 2017-05-18 NOTE — Telephone Encounter (Signed)
Patient called back to talk about having echo and then being see by a provider.    [place]   Is it ok with both of you to change to CoronaGollan in [place] office .

## 2017-05-20 NOTE — Telephone Encounter (Signed)
That is fine 

## 2017-05-21 NOTE — Telephone Encounter (Signed)
S/w pt who would like an appt w/Dr. Mariah MillingGollan. [place] to [place] in scheduling.

## 2017-05-25 ENCOUNTER — Ambulatory Visit
Admission: RE | Admit: 2017-05-25 | Discharge: 2017-05-25 | Disposition: A | Discharge status: Home / Self Care | Payer: Medicare Other | Source: Ambulatory Visit | Attending: Family Medicine | Admitting: Family Medicine

## 2017-05-25 DIAGNOSIS — F419 Anxiety disorder, unspecified: Secondary | ICD-10-CM | POA: Insufficient documentation

## 2017-05-25 DIAGNOSIS — R600 Localized edema: Secondary | ICD-10-CM | POA: Insufficient documentation

## 2017-05-25 DIAGNOSIS — R0609 Other forms of dyspnea: Secondary | ICD-10-CM | POA: Diagnosis not present

## 2017-05-25 DIAGNOSIS — R0601 Orthopnea: Secondary | ICD-10-CM | POA: Diagnosis not present

## 2017-05-25 DIAGNOSIS — I10 Essential (primary) hypertension: Secondary | ICD-10-CM | POA: Diagnosis not present

## 2017-05-25 DIAGNOSIS — R0600 Dyspnea, unspecified: Secondary | ICD-10-CM

## 2017-05-25 DIAGNOSIS — I517 Cardiomegaly: Secondary | ICD-10-CM | POA: Diagnosis not present

## 2017-05-25 NOTE — Progress Notes (Signed)
*  PRELIMINARY RESULTS* Echocardiogram 2D Echocardiogram has been performed.  Cristela BlueHege, Leland Raver 05/25/2017, 11:34 AM

## 2017-05-29 ENCOUNTER — Encounter: Payer: Self-pay | Admitting: Family Medicine

## 2017-05-29 ENCOUNTER — Other Ambulatory Visit: Payer: Self-pay | Admitting: Family Medicine

## 2017-05-29 DIAGNOSIS — I10 Essential (primary) hypertension: Secondary | ICD-10-CM

## 2017-05-30 ENCOUNTER — Other Ambulatory Visit: Payer: Self-pay

## 2017-05-30 DIAGNOSIS — K219 Gastro-esophageal reflux disease without esophagitis: Secondary | ICD-10-CM

## 2017-05-30 MED ORDER — OMEPRAZOLE 40 MG PO CPDR: 40.0000 mg | DELAYED_RELEASE_CAPSULE | Freq: Every day | ORAL | 3 refills | Status: DC

## 2017-06-01 ENCOUNTER — Telehealth: Payer: Self-pay

## 2017-06-01 ENCOUNTER — Other Ambulatory Visit: Payer: Self-pay

## 2017-06-01 DIAGNOSIS — I824Y3 Acute embolism and thrombosis of unspecified deep veins of proximal lower extremity, bilateral: Secondary | ICD-10-CM

## 2017-06-01 DIAGNOSIS — I2699 Other pulmonary embolism without acute cor pulmonale: Secondary | ICD-10-CM

## 2017-06-01 MED ORDER — RIVAROXABAN 20 MG PO TABS: 20.0000 mg | ORAL_TABLET | Freq: Every day | ORAL | 3 refills | Status: DC

## 2017-06-01 NOTE — Telephone Encounter (Signed)
Patient needs a refill on Xarelto 20mg  to [facility].

## 2017-06-01 NOTE — Telephone Encounter (Signed)
Rx send for approval. 

## 2017-06-21 NOTE — Progress Notes (Signed)
Cardiology Office Note  Date:  06/22/2017   ID:  Craig [name], DOB 01/07/1941, Craig [name]  PCP:  Smitty CordsKaramalegos, Craig J, DO   Chief Complaint  Patient presents with  . other    Follow up from Echo; establish care last seen be Dr. Antoine [name]. Meds reviewed by the pt. verbally. "doing well." Pt. c/o LE edema.     HPI:  Mr. Craig [name] is a 76 year old gentleman with past medical history of pulmonary embolism lower lobe right, causing elevated troponin cardiac catheterization showing some nonobstructive disease.   echo with some very mildly elevated pulmonary pressures.  CT which demonstrated bilateral pulmonary emboli.   DVT in both of his legs.  He has had a hypercoagulable workup.   elevated lupus anticoagulant. Recurrent DVT and PE He presents today for follow-up of his DVT and PE  Reports in general has been doing well Had leg swelling for one day, Comes a goes, Seen by PMD, echocardiogram ordered  Results reviewed with him in detail Echo with normal EF 55 to 60% LA moderately dilated, left atrial dimension was 4.4, more mild by parameters  Denies any shortness of breath, no chest pain  wears compression hose periodically  EKG personally reviewed by myself on todays visit Shows normal sinus rhythm with rate 83 bpm right bundle branch block  PMH:   has a past medical history of Anxiety, Essential hypertension, Pulmonary emboli (HCC), and 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(HCC).  PSH:    Past Surgical History:  Procedure Laterality Date  . LEFT HEART CATHETERIZATION WITH CORONARY ANGIOGRAM Bilateral 07/27/2014   Procedure: LEFT HEART CATHETERIZATION WITH CORONARY ANGIOGRAM;  Surgeon: Runell GessJonathan J Berry, MD;  [facility];  Service: Cardiovascular;  Laterality: Bilateral;  . SPLENECTOMY, TOTAL     TTP    Current Outpatient Medications  Medication Sig Dispense Refill  . amLODipine (NORVASC) 5 MG tablet Take 1 tablet (5 mg total) by mouth daily. 90 tablet 3  . atorvastatin (LIPITOR)  40 MG tablet Take 1 tablet (40 mg total) by mouth daily at 6 PM. 90 tablet 1  . cholecalciferol (VITAMIN D) 1000 UNITS tablet Take 1,000 Units by mouth daily.    Marland Kitchen. losartan (COZAAR) 100 MG tablet TAKE 1 TABLET BY MOUTH EVERY DAY FOR BLOOD PRESSURE 90 tablet 3  . Melatonin 5 MG TABS Take 10 mg by mouth at bedtime. 3 tabs    . metoprolol succinate (TOPROL-XL) 25 MG 24 hr tablet Take 1 tablet (25 mg total) by mouth daily. 90 tablet 1  . omeprazole (PRILOSEC) 40 MG capsule Take 1 capsule (40 mg total) daily by mouth. 90 capsule 3  . rivaroxaban (XARELTO) 20 MG TABS tablet Take 1 tablet (20 mg total) daily with supper by mouth. 90 tablet 3  . sildenafil (REVATIO) 20 MG tablet Take 1-5 pills about 30 min prior to sex. Start with 1 and increase as needed. 30 tablet 2  . vitamin B-12 (CYANOCOBALAMIN) 500 MCG tablet Take 500 mcg by mouth daily.    . vitamin E 1000 UNIT capsule Take 1,000 Units by mouth daily.     No current facility-administered medications for this visit.     Allergies:   Trazodone and nefazodone   Social History:  The patient  reports that he quit smoking about 38 years ago. His smoking use included cigarettes. He quit after 20.00 years of use. he has never used smokeless tobacco. He reports that he drinks about 1.8 oz of alcohol per week. He reports that he  does not use drugs.   Family History:   family history includes CAD in his brother; CAD (age of onset: 4260) in his father; Kidney failure in his mother.    Review of Systems: Review of Systems  Constitutional: Negative.   Respiratory: Negative.   Cardiovascular: Negative.   Gastrointestinal: Negative.   Musculoskeletal: Negative.   Neurological: Negative.   Psychiatric/Behavioral: Negative.   All other systems reviewed and are negative.    PHYSICAL EXAM: VS:  BP 130/72 (BP Location: Left Arm, Patient Position: Sitting, Cuff Size: Large)   Pulse 83   Ht 6\' 4"  (1.93 m)   Wt 262 lb 12 oz (119.2 kg)   BMI 31.98 kg/m   , BMI Body mass index is 31.98 kg/m. GEN: Well nourished, well developed, in no acute distress  HEENT: normal  Neck: no JVD, carotid bruits, or masses Cardiac: RRR; no murmurs, rubs, or gallops,no edema  Respiratory:  clear to auscultation bilaterally, normal work of breathing GI: soft, nontender, nondistended, + BS MS: no deformity or atrophy  Skin: warm and dry, no rash Neuro:  Strength and sensation are intact Psych: euthymic mood, full affect    Recent Labs: 05/01/2017: ALT 14; BUN 12; Creatinine, Ser 0.88; Hemoglobin 13.3; Platelets 276; Potassium 4.1; Sodium 142    Lipid Panel Lab Results  Component Value Date   CHOL 154 05/01/2017   HDL 98 05/01/2017   LDLCALC 39 05/01/2017   TRIG 83 05/01/2017      Wt Readings from Last 3 Encounters:  06/22/17 262 lb 12 oz (119.2 kg)  05/17/17 270 lb (122.5 kg)  05/08/17 276 lb 9.6 oz (125.5 kg)       ASSESSMENT AND PLAN:  Recurrent pulmonary emboli (HCC) Will stay on Xarelto indefinitely Compression hose as needed for leg swelling Elevated lupus anticoagulant  Essential hypertension Blood pressure is well controlled on today's visit. No changes made to the medications.  Mixed hyperlipidemia Cholesterol is at goal on the current lipid regimen. No changes to the medications were made.  History of DVT of lower extremity Unprovoked DVT, 2 occasions On Xarelto  SOB (shortness of breath) - Plan: EKG 12-Lead Denies any significant shortness of breath, no further workup at this time  Chronic anticoagulation No bleeding, discussed various types of blood thinner with him  Bilateral lower extremity edema Seems to come and go likely dependent edema Recommend he wear compressions as needed  Disposition:   F/U  12 months   Total encounter time more than 25 minutes  Greater than 50% was spent in counseling and coordination of care with the patient    Orders Placed This Encounter  Procedures  . EKG 12-Lead      Signed, Craig [name], M.D., Ph.D. 06/22/2017  [facility] [place] [phone]

## 2017-06-22 ENCOUNTER — Encounter: Payer: Self-pay | Admitting: Cardiovascular Disease

## 2017-06-22 ENCOUNTER — Ambulatory Visit: Payer: Medicare Other | Admitting: Cardiovascular Disease

## 2017-06-22 VITALS — BP 130/72 | HR 83 | Ht 76.0 in | Wt 262.8 lb

## 2017-06-22 DIAGNOSIS — R0602 Shortness of breath: Secondary | ICD-10-CM

## 2017-06-22 DIAGNOSIS — I10 Essential (primary) hypertension: Secondary | ICD-10-CM

## 2017-06-22 DIAGNOSIS — E782 Mixed hyperlipidemia: Secondary | ICD-10-CM

## 2017-06-22 DIAGNOSIS — R600 Localized edema: Secondary | ICD-10-CM

## 2017-06-22 DIAGNOSIS — Z86718 Personal history of other venous thrombosis and embolism: Secondary | ICD-10-CM

## 2017-06-22 DIAGNOSIS — Z7901 Long term (current) use of anticoagulants: Secondary | ICD-10-CM

## 2017-06-22 DIAGNOSIS — I2699 Other pulmonary embolism without acute cor pulmonale: Secondary | ICD-10-CM

## 2017-06-22 NOTE — Patient Instructions (Signed)

## 2017-07-02 ENCOUNTER — Encounter: Payer: Self-pay | Admitting: Family Medicine

## 2017-07-02 DIAGNOSIS — I10 Essential (primary) hypertension: Secondary | ICD-10-CM

## 2017-07-02 DIAGNOSIS — E785 Hyperlipidemia, unspecified: Secondary | ICD-10-CM

## 2017-07-03 MED ORDER — ATORVASTATIN CALCIUM 40 MG PO TABS: 40.0000 mg | ORAL_TABLET | Freq: Every day | ORAL | 3 refills | Status: DC

## 2017-07-03 MED ORDER — AMLODIPINE BESYLATE 5 MG PO TABS: 5.0000 mg | ORAL_TABLET | Freq: Every day | ORAL | 3 refills | Status: DC

## 2017-07-04 ENCOUNTER — Other Ambulatory Visit: Payer: Self-pay

## 2017-07-04 DIAGNOSIS — I10 Essential (primary) hypertension: Secondary | ICD-10-CM

## 2017-07-04 MED ORDER — LOSARTAN POTASSIUM 100 MG PO TABS: ORAL_TABLET | ORAL | 3 refills | Status: DC

## 2017-07-04 MED ORDER — METOPROLOL SUCCINATE ER 25 MG PO TB24: 25.0000 mg | ORAL_TABLET | Freq: Every day | ORAL | 1 refills | Status: DC

## 2017-08-20 ENCOUNTER — Encounter: Payer: Self-pay | Admitting: Family Medicine

## 2017-08-20 ENCOUNTER — Ambulatory Visit: Payer: Medicare Other | Admitting: Family Medicine

## 2017-08-20 VITALS — BP 164/75 | HR 78 | Temp 97.7°F | Resp 16 | Ht 76.0 in | Wt 267.0 lb

## 2017-08-20 DIAGNOSIS — F331 Major depressive disorder, recurrent, moderate: Secondary | ICD-10-CM | POA: Insufficient documentation

## 2017-08-20 DIAGNOSIS — F5101 Primary insomnia: Secondary | ICD-10-CM | POA: Diagnosis not present

## 2017-08-20 DIAGNOSIS — R600 Localized edema: Secondary | ICD-10-CM

## 2017-08-20 DIAGNOSIS — I10 Essential (primary) hypertension: Secondary | ICD-10-CM | POA: Diagnosis not present

## 2017-08-20 DIAGNOSIS — F3342 Major depressive disorder, recurrent, in full remission: Secondary | ICD-10-CM | POA: Diagnosis not present

## 2017-08-20 DIAGNOSIS — F3341 Major depressive disorder, recurrent, in partial remission: Secondary | ICD-10-CM | POA: Insufficient documentation

## 2017-08-20 MED ORDER — ZOLPIDEM TARTRATE 5 MG PO TABS: 5.0000 mg | ORAL_TABLET | Freq: Every evening | ORAL | 2 refills | Status: DC | PRN

## 2017-08-20 NOTE — Assessment & Plan Note (Signed)
Elevated today, did not take meds, had been well controlled - Home BP readings normal  Complication with h/o Grade 2 diastolic dysfunction > now on repeat ECHO normal LVEF and diastolic fxn [facility]   Plan:  1. Continue current BP regimen Amlodipine 5mg , Losartan 100mg , Metoprolol XL 25mg  daily - No change to Amlodipine today, limited swelling - Advised to call pharmacy for Losartan information to see if his rx was affected by recall 2. Encourage improved lifestyle - low sodium diet, improve regular exercise 3. Continue monitor BP outside office, bring readings to next visit, if persistently >140/90 or new symptoms notify office sooner 4. Follow-up 3 months

## 2017-08-20 NOTE — Progress Notes (Signed)
Subjective:    Patient ID: Craig [name], male    DOB: Dec 03, 1940, 77 y.o.   MRN: [id number]  Craig [name] is a 77 y.o. male presenting on 08/20/2017 for Edema   HPI   FOLLOW-UP EXERTIONAL DYSPNEA / LE Edema / HTN - Last visit with me 05/17/17, for same problem with variety of symptoms with LE edema and some dyspnea symptoms, treated with ECHO and Cardiology referral, see prior notes for background information. - Interval update with had ECHO on 05/25/17 results were mostly unremarkable with normal LVEF 55-60% but mild LA moderately dilated, he was previously established with Dr Antoine Poche in [place] back in 2016, now wanted to establish more locally, saw Dr Mariah Milling on 06/22/17 after my referral, patient states there was some confusion as to why he was referred but said his symptoms had resolved upon seeing Dr Mariah Milling, no other concerns at that time - Today patient reports he is doing well still, does not endorse any further exertional dyspnea, swelling or other complaints - Regarding swelling he was told to discuss Amlodipine with me to see if we need to change due to possible side effect of swelling. He has continued this med and now no problem. - He is also asking about Losartan recall if affecting him, he did not receive anything from pharmacy about his rx Losartan - Denies any chest pain or pressure, dyspnea, swelling, lightheadedness, pre-syncope or syncopal episode  He has changed pharmacy locally to [facility], preferred is [facility] mail order.  FOLLOW-UP Insomnia, Chronic / History of Mood Disorder - Last visit with me 04/2017, for same problems, within past year he has been treated with trial off tapering Sertraline and switch to Trazodone for better insomnia control instead of chronic SSRI that he believed was not helping anymore, also was on Melatonin up to 30mg  nightly, see prior notes for background information, specifically he states has had chronic insomnia "since age 55" and this  problem present for >60 years - Today patient reports he remains off other rx meds sertraline and trazodone now - He again reiterates his concerns about poor sleep, only relief he has ever had was with Ambien in past, and would like to try this med again, describes very poor sleep on chronic basis, he does not endorse depression and feels like problem is more with his sleep, especially feels like he has over-active mind at night difficulty "shutting it off" and getting to sleep, will wake up overnight if he falls asleep as well  Health Maintenance: UTD  Depression screen [facility] 2/9 08/20/2017 05/17/2017 05/08/2017  Decreased Interest 1 3 1   Down, Depressed, Hopeless 0 3 0  PHQ - 2 Score 1 6 1   Altered sleeping 3 3 1   Tired, decreased energy 1 3 1   Change in appetite 0 0 1  Feeling bad or failure about yourself  0 0 0  Trouble concentrating 0 0 0  Moving slowly or fidgety/restless 0 0 0  Suicidal thoughts 0 0 0  PHQ-9 Score 5 12 4   Difficult doing work/chores Not difficult at all Not difficult at all Not difficult at all    Social History   Tobacco Use  . Smoking status: Former Smoker    Years: 20.00    Types: Cigarettes    Last attempt to quit: 07/24/1978    Years since quitting: 39.1  . Smokeless tobacco: Never Used  . Tobacco comment: Quit tobacco in 1980  Substance Use Topics  . Alcohol use: Yes  Alcohol/week: 1.8 oz    Types: 3 Shots of liquor per week    Comment: daily  . Drug use: No    Review of Systems Per HPI unless specifically indicated above     Objective:    BP (!) 164/75   Pulse 78   Temp 97.7 F (36.5 C) (Oral)   Resp 16   Ht 6\' 4"  (1.93 m)   Wt 267 lb (121.1 kg)   BMI 32.50 kg/m   Wt Readings from Last 3 Encounters:  08/20/17 267 lb (121.1 kg)  06/22/17 262 lb 12 oz (119.2 kg)  05/17/17 270 lb (122.5 kg)    Physical Exam  Constitutional: He is oriented to person, place, and time. He appears well-developed and well-nourished. No distress.    Well-appearing, comfortable, cooperative  HENT:  Head: Normocephalic and atraumatic.  Mouth/Throat: Oropharynx is clear and moist.  Eyes: Conjunctivae are normal. Right eye exhibits no discharge. Left eye exhibits no discharge.  Neck: Normal range of motion. Neck supple.  Cardiovascular: Normal rate, regular rhythm, normal heart sounds and intact distal pulses.  No murmur heard. Pulmonary/Chest: Effort normal and breath sounds normal. No respiratory distress. He has no wheezes. He has no rales.  Musculoskeletal: Normal range of motion. He exhibits edema (Improved b/l lower extremity foot/ankle edema, now only trace to resolved). He exhibits no tenderness.  Upper / Lower Extremities: - Normal muscle tone, strength bilateral upper extremities 5/5, lower extremities 5/5  Lymphadenopathy:    He has no cervical adenopathy.  Neurological: He is alert and oriented to person, place, and time.  Distal sensation intact to light touch all extremities  Skin: Skin is warm and dry. No rash noted. He is not diaphoretic. No erythema.  Psychiatric: He has a normal mood and affect. His behavior is normal.  Well groomed, good eye contact, normal speech and thoughts. Good insight into health. Does not appear anxious but does seem upset with variety of things has complaints  Nursing note and vitals reviewed.  Results for orders placed or performed in visit on 12/27/16  Lipid panel  Result Value Ref Range   Cholesterol, Total 154 100 - 199 mg/dL   Triglycerides 83 0 - 149 mg/dL   HDL 98 >40 mg/dL   VLDL Cholesterol Cal 17 5 - 40 mg/dL   LDL Calculated 39 0 - 99 mg/dL   Chol/HDL Ratio 1.6 0.0 - 5.0 ratio  CBC with Differential/Platelet  Result Value Ref Range   WBC 8.5 3.4 - 10.8 x10E3/uL   RBC 3.79 (L) 4.14 - 5.80 x10E6/uL   Hemoglobin 13.3 13.0 - 17.7 g/dL   Hematocrit 98.1 19.1 - 51.0 %   MCV 105 (H) 79 - 97 fL   MCH 35.1 (H) 26.6 - 33.0 pg   MCHC 33.4 31.5 - 35.7 g/dL   RDW 47.8 (H) 29.5 - 62.1  %   Platelets 276 150 - 379 x10E3/uL   Neutrophils 38 Not Estab. %   Lymphs 48 Not Estab. %   Monocytes 13 Not Estab. %   Eos 1 Not Estab. %   Basos 0 Not Estab. %   Neutrophils Absolute 3.2 1.4 - 7.0 x10E3/uL   Lymphocytes Absolute 4.0 (H) 0.7 - 3.1 x10E3/uL   Monocytes Absolute 1.1 (H) 0.1 - 0.9 x10E3/uL   EOS (ABSOLUTE) 0.1 0.0 - 0.4 x10E3/uL   Basophils Absolute 0.0 0.0 - 0.2 x10E3/uL   Immature Granulocytes 0 Not Estab. %   Immature Grans (Abs) 0.0 0.0 - 0.1 x10E3/uL  Hemoglobin A1c  Result Value Ref Range   Hgb A1c MFr Bld 5.4 4.8 - 5.6 %   Est. average glucose Bld gHb Est-mCnc 108 mg/dL  Comprehensive metabolic panel  Result Value Ref Range   Glucose 104 (H) 65 - 99 mg/dL   BUN 12 8 - 27 mg/dL   Creatinine, Ser 5.40 0.76 - 1.27 mg/dL   GFR calc non Af Amer 84 >59 mL/min/1.73   GFR calc Af Amer 97 >59 mL/min/1.73   BUN/Creatinine Ratio 14 10 - 24   Sodium 142 134 - 144 mmol/L   Potassium 4.1 3.5 - 5.2 mmol/L   Chloride 105 96 - 106 mmol/L   CO2 23 20 - 29 mmol/L   Calcium 9.2 8.6 - 10.2 mg/dL   Total Protein 6.3 6.0 - 8.5 g/dL   Albumin 3.9 3.5 - 4.8 g/dL   Globulin, Total 2.4 1.5 - 4.5 g/dL   Albumin/Globulin Ratio 1.6 1.2 - 2.2   Bilirubin Total 0.6 0.0 - 1.2 mg/dL   Alkaline Phosphatase 77 39 - 117 IU/L   AST 29 0 - 40 IU/L   ALT 14 0 - 44 IU/L  PSA  Result Value Ref Range   Prostate Specific Ag, Serum 1.3 0.0 - 4.0 ng/mL      Assessment & Plan:   Problem List Items Addressed This Visit    Bilateral lower extremity edema    Improved, now seems to have mostly resolved LE Edema Uncertain etiology Repeat ECHO was normal LVEF 55-60% only mild LAD Prior Cards Dr Rolene Arbour, now saw Dr Mariah Milling locally No change on meds - advised can continue Amlodipine 5mg  daily for BP as long as not affecting his swelling, and then it is benign may continue for now Follow-up with Cards vs Vascular as needed in future      Essential hypertension (Chronic)     Elevated today, did not take meds, had been well controlled - Home BP readings normal  Complication with h/o Grade 2 diastolic dysfunction > now on repeat ECHO normal LVEF and diastolic fxn Followed by [facility]   Plan:  1. Continue current BP regimen Amlodipine 5mg , Losartan 100mg , Metoprolol XL 25mg  daily - No change to Amlodipine today, limited swelling - Advised to call pharmacy for Losartan information to see if his rx was affected by recall 2. Encourage improved lifestyle - low sodium diet, improve regular exercise 3. Continue monitor BP outside office, bring readings to next visit, if persistently >140/90 or new symptoms notify office sooner 4. 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 Agree to trial on Ambien 5mg  nightly to help improve sleep onset/maintenance - advised use PRN - if needs nightly in future we can consider this option - Continue to improve sleep hygiene, limit alcohol - Revisited same discussion as last time, patient does not recall and is not very receptive to agreement to see a Sleep Specialist in future if not improving on medicine or if needs med long-term, advised it is important to have further testing if still a problem on the medicine, may need switch to Ambien CR in future - If needs long-term will check UDS and sign [facility] Controlled agreement      Relevant Medications   zolpidem (AMBIEN) 5 MG  tablet   Recurrent major depressive disorder, in full remission (HCC)    Stable to improved History of mood disorder, now seems to be uncontrol primary issue is sleep and insomnia, seems more primary Off SSRI and Trazodone now for while, he states mood is controlled See A&P for insomnia         Meds ordered this  encounter  Medications  . zolpidem (AMBIEN) 5 MG tablet    Sig: Take 1 tablet (5 mg total) by mouth at bedtime as needed for sleep.    Dispense:  30 tablet    Refill:  2    Follow up plan: Return in about 3 months (around 11/18/2017) for Insomnia, med refills.  Saralyn PilarAlexander Dalonda Simoni, DO [facility] 08/20/2017, 1:26 PM

## 2017-08-20 NOTE — Patient Instructions (Addendum)
Thank you for coming to the office today.  1. For Sleep - start with Ambien 5mg  nightly as needed if you do not need it every night, then do not take it, it may help reset sleep cycle  In future, we may need to adjust dose or try alternative medicine, sometimes we can use the CR or continued release to help keep you asleep  I would continue taking Amlodipine daily - you can still have some swelling this but it is benign it does.  Please schedule a Follow-up Appointment to: Return in about 3 months (around 11/18/2017) for Insomnia, med refills.    If you have any other questions or concerns, please feel free to call the office or send a message through MyChart. You may also schedule an earlier appointment if necessary.  Additionally, you may be receiving a survey about your experience at our office within a few days to 1 week by e-mail or mail. We value your feedback.  Saralyn PilarAlexander Karamalegos, DO [facility]

## 2017-08-20 NOTE — Assessment & Plan Note (Signed)
Improved, now seems to have mostly resolved LE Edema Uncertain etiology Repeat ECHO was normal LVEF 55-60% only mild LAD Prior Cards Dr Rolene ArbourHochrein Ipava, now saw Dr Mariah MillingGollan locally No change on meds - advised can continue Amlodipine 5mg  daily for BP as long as not affecting his swelling, and then it is benign may continue for now Follow-up with Cards vs Vascular as needed in future

## 2017-08-20 NOTE — Assessment & Plan Note (Signed)
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 Agree to trial on Ambien 5mg  nightly to help improve sleep onset/maintenance - advised use PRN - if needs nightly in future we can consider this option - Continue to improve sleep hygiene, limit alcohol - Revisited same discussion as last time, patient does not recall and is not very receptive to agreement to see a Sleep Specialist in future if not improving on medicine or if needs med long-term, advised it is important to have further testing if still a problem on the medicine, may need switch to Ambien CR in future - If needs long-term will check UDS and sign [facility] Controlled agreement

## 2017-08-20 NOTE — Assessment & Plan Note (Signed)
Stable to improved History of mood disorder, now seems to be uncontrol primary issue is sleep and insomnia, seems more primary Off SSRI and Trazodone now for while, he states mood is controlled See A&P for insomnia

## 2017-09-05 ENCOUNTER — Other Ambulatory Visit: Payer: Self-pay | Admitting: Family Medicine

## 2017-09-05 DIAGNOSIS — I10 Essential (primary) hypertension: Secondary | ICD-10-CM

## 2017-11-19 ENCOUNTER — Ambulatory Visit: Payer: Medicare Other | Admitting: Family Medicine

## 2017-11-19 ENCOUNTER — Encounter: Payer: Self-pay | Admitting: Family Medicine

## 2017-11-19 VITALS — BP 145/59 | HR 70 | Temp 97.8°F | Resp 16 | Ht 76.0 in | Wt 269.8 lb

## 2017-11-19 DIAGNOSIS — F331 Major depressive disorder, recurrent, moderate: Secondary | ICD-10-CM | POA: Diagnosis not present

## 2017-11-19 DIAGNOSIS — F5101 Primary insomnia: Secondary | ICD-10-CM

## 2017-11-19 MED ORDER — ESCITALOPRAM OXALATE 10 MG PO TABS: 10.0000 mg | ORAL_TABLET | Freq: Every day | ORAL | 1 refills | Status: DC

## 2017-11-19 MED ORDER — ZOLPIDEM TARTRATE 10 MG PO TABS: 10.0000 mg | ORAL_TABLET | Freq: Every evening | ORAL | 2 refills | Status: DC | PRN

## 2017-11-19 NOTE — Progress Notes (Signed)
Subjective:    Patient ID: Craig [name], male    DOB: June 17, 1941, 77 y.o.   MRN: [id number]  Craig [name] is a 77 y.o. male presenting on 11/19/2017 for Insomnia and Hypertension (obtw patient has lack of energy)   HPI    FOLLOW-UP HTN Reports some elevated BP at office otherwise his BP is normal by his report, w/o detailed log currently Current Meds - Amlodipine  daily, Metoprolol XL  daily   Reports good compliance, took meds today. Tolerating well, w/o complaints. Denies CP, dyspnea, HA, edema, dizziness / lightheadedness  FOLLOW-UP Insomnia, Chronic / History of Mood Disorder - Last visit with me 07/2017, for same problem, treated with trial on Ambien  nightly, see prior notes for background information. - Interval update with mixed results, ultimately he required higher dose Ambien and Melatonin for good results - Today patient reports he was sleeping better after adjusting dose, but still admits some reduced energy and feels like he has negative thoughts most days due to life stressors. Feels like mood is affecting him. Yet he still declines to go to Psychiatry or Therapist. Not interested. Also he remains not interested in Sleep Specialist - Initially he states that he would take the new rx Ambien , go to bed at 9pm, then would not be able to fall asleep until 2am. He then after few nights tried Melatonin  x 2 for , he would actually fall asleep better after 10 days within about 1-2 hours. Then he would wake up overnight 2-3am - He tried OTC Sleep-Eze (Diphenhydramine) - Failed past meds Trazodone, Zoloft SSRI - See PHQ for ROS - Denies suicidal or homicidal ideation  Depression screen [facility] 2/9 11/19/2017 08/20/2017 05/17/2017  Decreased Interest Down, Depressed, Hopeless 2 0 3  PHQ - 2 Score Altered sleeping Tired, decreased energy Change in appetite 1 0 0  Feeling bad or failure about yourself  0 0 0  Trouble concentrating 0 0  0  Moving slowly or fidgety/restless 2 0 0  Suicidal thoughts 0 0 0  PHQ-9 Score Difficult doing work/chores Somewhat difficult Not difficult at all Not difficult at all   GAD 7 : Generalized Anxiety Score 12/27/2016  Nervous, Anxious, on Edge 0  Control/stop worrying 0  Worry too much - different things 1  Trouble relaxing 0  Restless 0  Easily annoyed or irritable 0  Afraid - awful might happen 0  Total GAD 7 Score 1  Anxiety Difficulty Not difficult at all   Social History   Tobacco Use  . Smoking status: Former Smoker    Years: 20.00    Types: Cigarettes    Last attempt to quit: 07/24/1978    Years since quitting: 39.3  . Smokeless tobacco: Never Used  . Tobacco comment: Quit tobacco in 1980  Substance Use Topics  . Alcohol use: Yes    Alcohol/week: 1.8 oz    Types: 3 Shots of liquor per week    Comment: daily  . Drug use: No    Review of Systems Per HPI unless specifically indicated above     Objective:    BP (!) 145/59   Pulse 70   Temp 97.8 F (36.6 C) (Oral)   Resp 16   Ht  (1.93 m)   Wt 269 lb 12.8 oz (122.4 kg)   BMI 32.84 kg/m   Wt  Readings from Last 3 Encounters:  11/19/17 269 lb 12.8 oz (122.4 kg)  08/20/17 267 lb (121.1 kg)  06/22/17 262 lb 12 oz (119.2 kg)    Physical Exam  Constitutional: He is oriented to person, place, and time. He appears well-developed and well-nourished. No distress.  Well-appearing, comfortable, cooperative  HENT:  Head: Normocephalic and atraumatic.  Mouth/Throat: Oropharynx is clear and moist.  Eyes: Conjunctivae are normal. Right eye exhibits no discharge. Left eye exhibits no discharge.  Cardiovascular: Normal rate.  Pulmonary/Chest: Effort normal.  Musculoskeletal: He exhibits no edema.  Neurological: He is alert and oriented to person, place, and time.  Skin: Skin is warm and dry. No rash noted. He is not diaphoretic. No erythema.  Psychiatric: He has a normal mood and affect. His behavior is  normal.  Well groomed, good eye contact, normal speech and thoughts  Nursing note and vitals reviewed.  Results for orders placed or performed in visit on 12/27/16  Lipid panel  Result Value Ref Range   Cholesterol, Total 154 100 - 199 mg/dL   Triglycerides 83 0 - 149 mg/dL   HDL 98 >19 mg/dL   VLDL Cholesterol Cal 17 5 - 40 mg/dL   LDL Calculated 39 0 - 99 mg/dL   Chol/HDL Ratio 1.6 0.0 - 5.0 ratio  CBC with Differential/Platelet  Result Value Ref Range   WBC 8.5 3.4 - 10.8 x10E3/uL   RBC 3.79 (L) 4.14 - 5.80 x10E6/uL   Hemoglobin 13.3 13.0 - 17.7 g/dL   Hematocrit 14.7 82.9 - 51.0 %   MCV 105 (H) 79 - 97 fL   MCH 35.1 (H) 26.6 - 33.0 pg   MCHC 33.4 31.5 - 35.7 g/dL   RDW 56.2 (H) 13.0 - 86.5 %   Platelets 276 150 - 379 x10E3/uL   Neutrophils 38 Not Estab. %   Lymphs 48 Not Estab. %   Monocytes 13 Not Estab. %   Eos 1 Not Estab. %   Basos 0 Not Estab. %   Neutrophils Absolute 3.2 1.4 - 7.0 x10E3/uL   Lymphocytes Absolute 4.0 (H) 0.7 - 3.1 x10E3/uL   Monocytes Absolute 1.1 (H) 0.1 - 0.9 x10E3/uL   EOS (ABSOLUTE) 0.1 0.0 - 0.4 x10E3/uL   Basophils Absolute 0.0 0.0 - 0.2 x10E3/uL   Immature Granulocytes 0 Not Estab. %   Immature Grans (Abs) 0.0 0.0 - 0.1 x10E3/uL  Hemoglobin A1c  Result Value Ref Range   Hgb A1c MFr Bld 5.4 4.8 - 5.6 %   Est. average glucose Bld gHb Est-mCnc 108 mg/dL  Comprehensive metabolic panel  Result Value Ref Range   Glucose 104 (H) 65 - 99 mg/dL   BUN 12 8 - 27 mg/dL   Creatinine, Ser 7.84 0.76 - 1.27 mg/dL   GFR calc non Af Amer 84 >59 mL/min/1.73   GFR calc Af Amer 97 >59 mL/min/1.73   BUN/Creatinine Ratio 14 10 - 24   Sodium 142 134 - 144 mmol/L   Potassium 4.1 3.5 - 5.2 mmol/L   Chloride 105 96 - 106 mmol/L   CO2 23 20 - 29 mmol/L   Calcium 9.2 8.6 - 10.2 mg/dL   Total Protein 6.3 6.0 - 8.5 g/dL   Albumin 3.9 3.5 - 4.8 g/dL   Globulin, Total 2.4 1.5 - 4.5 g/dL   Albumin/Globulin Ratio 1.6 1.2 - 2.2   Bilirubin Total 0.6 0.0 - 1.2  mg/dL   Alkaline Phosphatase 77 39 - 117 IU/L   AST 29 0 - 40  IU/L   ALT 14 0 - 44 IU/L  PSA  Result Value Ref Range   Prostate Specific Ag, Serum 1.3 0.0 - 4.0 ng/mL      Assessment & Plan:   Problem List Items Addressed This Visit    Insomnia    Improved somewhat on Ambien higher dose than prescribed in combo with Melatonin Otherwise difficult to control 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 Discussion on med management for insomnia - emphasis on sustainable treatment such as treating underlying cause depression etc - and review benefits, risks - ultimately given chronicity of his problem and some benefit from higher dose Ambien, agree to continue for now, advised him that age >37 this is higher risk med but he tolerates it well - Rx Ambien  nightly PRN for sleep, #30 with +2 refills - may take intermittent vs nightly if need in future, use caution for sedation - May continue Melatonin - Additionally he agreed to try new SSRI with goal of more stabilization for his sleep and mood in future - Lexapro  daily, future taper dose up to  if benefit - Again advised if not benefit from current therapy - limited other options left, next may again recommend that he see a Sleep Specialist in future if not improving on medicine or if needs med long-term, advised it is important to have further testing if still a problem on the medicine, may need switch to Ambien CR in future - If needs long-term will check UDS and sign [facility] Controlled agreement at future apt      Relevant Medications   zolpidem (AMBIEN) 10 MG tablet   escitalopram (LEXAPRO) 10 MG tablet   Recurrent major depressive disorder, in full remission (HCC) - Primary    Interval worsening mood with known MDD, seems life stressors affecting him,  likely significant factor for worsening his insomnia, despite having some level of "primary" insomnia. Failed: Zoloft, Trazodone  Plan Start new SSRI Lexapro  daily - discussed indication, dosing, benefits may help his insomnia, review possible side effects similar to past zoloft med - Treat Insomnia additionally w/ Ambien - see A&P - Again recommended if meds not effective we would consider refer to Psychiatry or Therapist but he has declined this again - Follow-up 3 months      Relevant Medications   escitalopram (LEXAPRO) 10 MG tablet      Meds ordered this encounter  Medications  . zolpidem (AMBIEN) 10 MG tablet    Sig: Take 1 tablet (10 mg total) by mouth at bedtime as needed for sleep.    Dispense:  30 tablet    Refill:  2  . escitalopram (LEXAPRO) 10 MG tablet    Sig: Take 1 tablet (10 mg total) by mouth daily.    Dispense:  90 tablet    Refill:  1    Follow up plan: Return in about 3 months (around 02/18/2018) for Depression MDD / Insomnia.  Saralyn Pilar, DO [facility] 11/19/2017, 11:39 PM

## 2017-11-19 NOTE — Assessment & Plan Note (Signed)
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 Discussion on med management for insomnia - emphasis on sustainable treatment such as treating underlying cause depression etc - and review benefits, risks - ultimately given chronicity of his problem and some benefit from higher dose Ambien, agree to continue for now, advised him that age >26 this is higher risk med but he tolerates it well - Rx Ambien  nightly PRN for sleep, #30 with +2 refills - may take intermittent vs nightly if need in future, use caution for sedation - May continue Melatonin - Additionally he agreed to try new SSRI with goal of more stabilization for his sleep and mood in future - Lexapro  daily, future taper dose up to  if benefit - Again advised if not benefit from current therapy - limited other options left, next may again recommend that he see a Sleep Specialist in future if not improving on medicine or if needs med long-term, advised it is important to have further testing if still a problem on the medicine, may need switch to Ambien CR in future - If needs long-term will check UDS and sign [facility] Controlled agreement at future apt

## 2017-11-19 NOTE — Assessment & Plan Note (Signed)
Interval worsening mood with known MDD, seems life stressors affecting him, likely significant factor for worsening his insomnia, despite having some level of "primary" insomnia. Failed: Zoloft, Trazodone  Plan Start new SSRI Lexapro  daily - discussed indication, dosing, benefits may help his insomnia, review possible side effects similar to past zoloft med - Treat Insomnia additionally w/ Ambien - see A&P - Again recommended if meds not effective we would consider refer to Psychiatry or Therapist but he has declined this again - Follow-up 3 months

## 2017-11-19 NOTE — Patient Instructions (Addendum)
Thank you for coming to the office today.  Refilled Ambien - increase from 5 to  as discussed, this is to be used with caution, it can cause significant sedation and prefer to take only as needed but for now can take nightly until lexapro takes effect - long term we cannot continue this and may need sleep specialist  Start Escitalopram (Lexapro)  daily - take everyday in morning with food - after 4-6 weeks this may need to be increased to , you can call or come back if you feel like you need inc dose  Please schedule a Follow-up Appointment to: Return in about 3 months (around 02/18/2018) for Depression MDD / Insomnia.  If you have any other questions or concerns, please feel free to call the office or send a message through MyChart. You may also schedule an earlier appointment if necessary.  Additionally, you may be receiving a survey about your experience at our office within a few days to 1 week by e-mail or mail. We value your feedback.  Saralyn Pilar, DO [facility], [place]

## 2017-11-23 ENCOUNTER — Encounter: Payer: Self-pay | Admitting: Family Medicine

## 2017-11-23 DIAGNOSIS — F5101 Primary insomnia: Secondary | ICD-10-CM

## 2017-11-23 MED ORDER — ZOLPIDEM TARTRATE 10 MG PO TABS: 10.0000 mg | ORAL_TABLET | Freq: Every evening | ORAL | 2 refills | Status: DC | PRN

## 2018-02-18 ENCOUNTER — Encounter: Payer: Self-pay | Admitting: Family Medicine

## 2018-02-18 ENCOUNTER — Ambulatory Visit: Payer: Medicare Other | Admitting: Family Medicine

## 2018-02-18 ENCOUNTER — Other Ambulatory Visit: Payer: Self-pay | Admitting: Family Medicine

## 2018-02-18 VITALS — BP 131/65 | HR 65 | Temp 98.3°F | Resp 16 | Ht 76.0 in | Wt 250.0 lb

## 2018-02-18 DIAGNOSIS — F5101 Primary insomnia: Secondary | ICD-10-CM | POA: Diagnosis not present

## 2018-02-18 DIAGNOSIS — E782 Mixed hyperlipidemia: Secondary | ICD-10-CM

## 2018-02-18 DIAGNOSIS — R351 Nocturia: Secondary | ICD-10-CM

## 2018-02-18 DIAGNOSIS — I10 Essential (primary) hypertension: Secondary | ICD-10-CM

## 2018-02-18 DIAGNOSIS — I2699 Other pulmonary embolism without acute cor pulmonale: Secondary | ICD-10-CM

## 2018-02-18 DIAGNOSIS — F3341 Major depressive disorder, recurrent, in partial remission: Secondary | ICD-10-CM | POA: Diagnosis not present

## 2018-02-18 DIAGNOSIS — N401 Enlarged prostate with lower urinary tract symptoms: Secondary | ICD-10-CM

## 2018-02-18 DIAGNOSIS — Z0000 Encounter for general adult medical examination without abnormal findings: Secondary | ICD-10-CM

## 2018-02-18 DIAGNOSIS — Z7901 Long term (current) use of anticoagulants: Secondary | ICD-10-CM

## 2018-02-18 DIAGNOSIS — R7309 Other abnormal glucose: Secondary | ICD-10-CM

## 2018-02-18 MED ORDER — ZOLPIDEM TARTRATE 10 MG PO TABS: 10.0000 mg | ORAL_TABLET | Freq: Every evening | ORAL | 2 refills | Status: DC | PRN

## 2018-02-18 NOTE — Assessment & Plan Note (Signed)
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 Discussion on med management for insomnia - emphasis on treating underlying cause depression etc - and review benefits, risks - ultimately given chronicity of his problem and some benefit from higher dose Ambien, agree to continue for now, advised him that age >65 this is higher risk med but he tolerates it well - Rx Ambien 10mg nightly PRN for sleep, #30 with +2 refills - may take intermittent vs nightly if need in future, use caution for sedation - May continue Melatonin - Will continue Escitalopram 10mg daily - uncertain results, seems mixed at this time, some side effects per HPI, tolerating better at bedtime, declined to reduce dose or adjust today, will continue for 3 more months - Again advised if not benefit from current therapy - limited other options left, next may again recommend that he see a Sleep Specialist in future if not improving on medicine or if needs med long-term, advised it is important to have further testing if still a problem on the medicine, may need switch to Ambien CR in future - If needs long-term will check UDS and sign [facility] Controlled agreement at future apt 

## 2018-02-18 NOTE — Assessment & Plan Note (Signed)
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 Discussion on med management for insomnia - emphasis on treating underlying cause depression etc - and review benefits, risks - ultimately given chronicity of his problem and some benefit from higher dose Ambien, agree to continue for now, advised him that age 77>65 this is higher risk med but he tolerates it well - Rx Ambien 10mg  nightly PRN for sleep, #30 with +2 refills - may take intermittent vs nightly if need in future, use caution for sedation - May continue Melatonin - Will continue Escitalopram 10mg  daily - uncertain results, seems mixed at this time, some side effects per HPI, tolerating better at bedtime, declined to reduce dose or adjust today, will continue for 3 more months - Again advised if not benefit from current therapy - limited other options left, next may again recommend that he see a Sleep Specialist in future if not improving on medicine or if needs med long-term, advised it is important to have further testing if still a problem on the medicine, may need switch to Ambien CR in future - If needs long-term will check UDS and sign [facility] Controlled agreement at future apt

## 2018-02-18 NOTE — Patient Instructions (Addendum)
Thank you for coming to the office today.  Refilled Ambien sent to [facility] pharmacy.  Continue Lexapro (escitalopram) 10mg  daily in evening for now  Recommend meal supplement Ensure, Boost daily energy and health benefits.  If by next visit your mood is not improved or do not feel like it is effective - then we can STOP medication or reduce dose.  CALL US BEFORE YOU GET LABS DRAWN - 1 day before or few hours.  DUE for FASTING BLOOD WORK (no food or drink after midnight before the lab appointment, only water or coffee without cream/sugar on the morning of) [facility] 3 [facility]   - Make sure Lab Only appointment is at about 1 week before your next appointment, so that results will be available  For Lab Results, once available within 2-3 days of blood draw, you can can log in to MyChart online to view your results and a brief explanation. Also, we can discuss results at next follow-up visit.   Please schedule a Follow-up Appointment to: Return in about 3 months (around 05/21/2018) for Annual Physical.  If you have any other questions or concerns, please feel free to call the office or send a message through MyChart. You may also schedule an earlier appointment if necessary.  Additionally, you may be receiving a survey about your experience at our office within a few days to 1 week by e-mail or mail. We value your feedback.  Saralyn PilarAlexander Karamalegos, DO [facility], [place]

## 2018-02-18 NOTE — Progress Notes (Signed)
Subjective:    Patient ID: Craig [name], male    DOB: 12/05/1940, 77 y.o.   MRN: [id number]  Craig [name] is a 77 y.o. male presenting on 02/18/2018 for Insomnia   HPI   FOLLOW-UPInsomnia, Chronic / History of Mood Disorder - Last visit 11/19/17 - he was prescribed Escitalopram 10mg  in 1pm afternoon, he was feeling reduced appetite and dizzy and had some weight loss, about 2 months ago he switched medicine to PM dose instead of AM. He states his appetite is improved but still not normal again. He is not sure if medication is helping his mood and anxiety, but he has confusion regarding his diagnosis of depression, he states "in mind feels like age 77" but he does not "want to do anything" he is not entirely sure he feels depressed but cannot describe, he attributes this to age and lack of motivation in general by his report. - Today requests to continue Lexapro for longer to see if it will help. He states was out of Ambien for 1 month, despite last rx had 2 refills, he was unaware of this last refill. [facility] - He continues on Ambien 10mg  nightly with melatonin for best insomnia results - Similar to previous conversation he has long history of insomnia, and is not interested in sleep specialist or other mental health professional to help clarify his diagnosis of depression - Failed past meds Trazodone, Zoloft SSRI - See PHQ for ROS - Denies suicidal or homicidal ideation   [facility] 02/18[facility]2018 11/19/2017 08/20/2017  Decreased Interest 3 3 1   Down, Depressed, Hopeless 1 2 0  PHQ - 2 Score 4 5 1   Altered sleeping 3 3 3   Tired, decreased energy 3 3 1   Change in appetite 2 1 0  Feeling bad or failure about yourself  1 0 0  Trouble concentrating 0 0 0  Moving slowly or fidgety/restless 1 2 0  Suicidal thoughts 0 0 0  PHQ-9 Score 14 14 5   Difficult doing work/chores Somewhat difficult Somewhat difficult Not difficult at all  Some  recent data might be hidden   GAD 7 : Generalized Anxiety Score 12/27/2016  Nervous, Anxious, on Edge 0  Control/stop worrying 0  Worry too much - different things 1  Trouble relaxing 0  Restless 0  Easily annoyed or irritable 0  Afraid - awful might happen 0  Total GAD 7 Score 1  Anxiety Difficulty Not difficult at all    Social History   Tobacco Use  . Smoking status: Former Smoker    Years: 20.00    Types: Cigarettes    Last attempt to quit: 07/24/1978    Years since quitting: 39.6  . Smokeless tobacco: Never Used  . Tobacco comment: Quit tobacco in 1980  Substance Use Topics  . Alcohol use: Yes    Alcohol/week: 1.8 oz    Types: 3 Shots of liquor per week    Comment: daily  . Drug use: No    Review of Systems Per HPI unless specifically indicated above     Objective:    BP 131/65   Pulse 65   Temp 98.3 F (36.8 C) (Oral)   Resp 16   Ht 6\' 4"  (1.93 m)   Wt 250 lb (113.4 kg)   BMI 30.43 kg/m   Wt Readings from Last 3 Encounters:  02/18/18 250 lb (113.4 kg)  11/19/17 269 lb 12.8 oz (122.4  kg)  08/20/17 267 lb (121.1 kg)    Physical Exam  Constitutional: He is oriented to person, place, and time. He appears well-developed and well-nourished. No distress.  Well-appearing, comfortable, cooperative - weight loss  HENT:  Head: Normocephalic and atraumatic.  Mouth/Throat: Oropharynx is clear and moist.  Eyes: Conjunctivae are normal. Right eye exhibits no discharge. Left eye exhibits no discharge.  Cardiovascular: Normal rate.  Pulmonary/Chest: Effort normal.  Musculoskeletal: He exhibits no edema.  Neurological: He is alert and oriented to person, place, and time.  Skin: Skin is warm and dry. No rash noted. He is not diaphoretic. No erythema.  Psychiatric: He has a normal mood and affect. His behavior is normal.  Well groomed, good eye contact, normal speech and thoughts  Nursing note and vitals reviewed.  Results for orders placed or performed in visit on  12/27/16  Lipid panel  Result Value Ref Range   Cholesterol, Total 154 100 - 199 mg/dL   Triglycerides 83 0 - 149 mg/dL   HDL 98 >16 mg/dL   VLDL Cholesterol Cal 17 5 - 40 mg/dL   LDL Calculated 39 0 - 99 mg/dL   Chol/HDL Ratio 1.6 0.0 - 5.0 ratio  CBC with Differential/Platelet  Result Value Ref Range   WBC 8.5 3.4 - 10.8 x10E3/uL   RBC 3.79 (L) 4.14 - 5.80 x10E6/uL   Hemoglobin 13.3 13.0 - 17.7 g/dL   Hematocrit 10.9 60.4 - 51.0 %   MCV 105 (H) 79 - 97 fL   MCH 35.1 (H) 26.6 - 33.0 pg   MCHC 33.4 31.5 - 35.7 g/dL   RDW 54.0 (H) 98.1 - 19.1 %   Platelets 276 150 - 379 x10E3/uL   Neutrophils 38 Not Estab. %   Lymphs 48 Not Estab. %   Monocytes 13 Not Estab. %   Eos 1 Not Estab. %   Basos 0 Not Estab. %   Neutrophils Absolute 3.2 1.4 - 7.0 x10E3/uL   Lymphocytes Absolute 4.0 (H) 0.7 - 3.1 x10E3/uL   Monocytes Absolute 1.1 (H) 0.1 - 0.9 x10E3/uL   EOS (ABSOLUTE) 0.1 0.0 - 0.4 x10E3/uL   Basophils Absolute 0.0 0.0 - 0.2 x10E3/uL   Immature Granulocytes 0 Not Estab. %   Immature Grans (Abs) 0.0 0.0 - 0.1 x10E3/uL  Hemoglobin A1c  Result Value Ref Range   Hgb A1c MFr Bld 5.4 4.8 - 5.6 %   Est. average glucose Bld gHb Est-mCnc 108 mg/dL  Comprehensive metabolic panel  Result Value Ref Range   Glucose 104 (H) 65 - 99 mg/dL   BUN 12 8 - 27 mg/dL   Creatinine, Ser 4.78 0.76 - 1.27 mg/dL   GFR calc non Af Amer 84 >59 mL/min/1.73   GFR calc Af Amer 97 >59 mL/min/1.73   BUN/Creatinine Ratio 14 10 - 24   Sodium 142 134 - 144 mmol/L   Potassium 4.1 3.5 - 5.2 mmol/L   Chloride 105 96 - 106 mmol/L   CO2 23 20 - 29 mmol/L   Calcium 9.2 8.6 - 10.2 mg/dL   Total Protein 6.3 6.0 - 8.5 g/dL   Albumin 3.9 3.5 - 4.8 g/dL   Globulin, Total 2.4 1.5 - 4.5 g/dL   Albumin/Globulin Ratio 1.6 1.2 - 2.2   Bilirubin Total 0.6 0.0 - 1.2 mg/dL   Alkaline Phosphatase 77 39 - 117 IU/L   AST 29 0 - 40 IU/L   ALT 14 0 - 44 IU/L  PSA  Result Value Ref Range  Prostate Specific Ag, Serum 1.3 0.0 -  4.0 ng/mL      Assessment & Plan:   Problem List Items Addressed This Visit    Insomnia    Still improved on higher dose Ambien when takes medication Otherwise difficult to control 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 Discussion on med management for insomnia - emphasis on treating underlying cause depression etc - and review benefits, risks - ultimately given chronicity of his problem and some benefit from higher dose Ambien, agree to continue for now, advised him that age >17 this is higher risk med but he tolerates it well - Rx Ambien 10mg  nightly PRN for sleep, #30 with +2 refills - may take intermittent vs nightly if need in future, use caution for sedation - May continue Melatonin - Will continue Escitalopram 10mg  daily - uncertain results, seems mixed at this time, some side effects per HPI, tolerating better at bedtime, declined to reduce dose or adjust today, will continue for 3 more months - Again advised if not benefit from current therapy - limited other options left, next may again recommend that he see a Sleep Specialist in future if not improving on medicine or if needs med long-term, advised it is important to have further testing if still a problem on the medicine, may need switch to Ambien CR in future - If needs long-term will check UDS and sign [facility] Controlled agreement at future apt      Relevant Medications   zolpidem (AMBIEN) 10 MG tablet   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 Discussion on med management for insomnia - emphasis on treating underlying cause depression etc - and review benefits, risks - ultimately given chronicity of his problem and some benefit from higher dose Ambien, agree to continue for now, advised him that age >65 this is higher risk med but he tolerates it well - Rx Ambien 10mg  nightly PRN for sleep, #30 with +2 refills - may take intermittent vs nightly if need in future, use caution for sedation - May continue Melatonin - Will continue Escitalopram 10mg  daily - uncertain results, seems mixed at this time, some side effects per HPI, tolerating better at bedtime, declined to reduce dose or adjust today, will continue for 3 more months - Again advised if not benefit from current therapy - limited other options left, next may again recommend that he see a Sleep Specialist in future if not improving on medicine or if needs med long-term, advised it is important to have further testing if still a problem on the medicine, may need switch to Ambien CR in future - If needs long-term will check UDS and sign [facility] Controlled agreement at future apt         Meds ordered this encounter  Medications  . zolpidem (AMBIEN) 10 MG tablet    Sig: Take 1 tablet (10 mg total) by mouth at bedtime as needed for sleep.    Dispense:  30 tablet    Refill:  2     Follow up  plan: Return in about 3 months (around 05/21/2018) for Annual Physical.  Future labs ordered for 04/2018 approx - [facility] - he will notify us when ready to release orders / fax to [facility]  Saralyn Pilar, DO [facility] 02/18/2018, 1:13 PM

## 2018-03-31 ENCOUNTER — Other Ambulatory Visit: Payer: Self-pay | Admitting: Family Medicine

## 2018-03-31 DIAGNOSIS — I824Y3 Acute embolism and thrombosis of unspecified deep veins of proximal lower extremity, bilateral: Secondary | ICD-10-CM

## 2018-03-31 DIAGNOSIS — I2699 Other pulmonary embolism without acute cor pulmonale: Secondary | ICD-10-CM

## 2018-04-15 ENCOUNTER — Other Ambulatory Visit: Payer: Self-pay | Admitting: Family Medicine

## 2018-04-15 DIAGNOSIS — F331 Major depressive disorder, recurrent, moderate: Secondary | ICD-10-CM

## 2018-04-15 DIAGNOSIS — E785 Hyperlipidemia, unspecified: Secondary | ICD-10-CM

## 2018-04-15 DIAGNOSIS — F5101 Primary insomnia: Secondary | ICD-10-CM

## 2018-04-15 DIAGNOSIS — K219 Gastro-esophageal reflux disease without esophagitis: Secondary | ICD-10-CM

## 2018-04-15 DIAGNOSIS — I10 Essential (primary) hypertension: Secondary | ICD-10-CM

## 2018-04-22 NOTE — Progress Notes (Signed)
Received fax from [facility] that patient received Flu shot.   

## 2018-05-15 ENCOUNTER — Other Ambulatory Visit: Payer: Self-pay

## 2018-05-15 ENCOUNTER — Telehealth: Payer: Self-pay | Admitting: Family Medicine

## 2018-05-15 DIAGNOSIS — Z0000 Encounter for general adult medical examination without abnormal findings: Secondary | ICD-10-CM

## 2018-05-15 DIAGNOSIS — I2699 Other pulmonary embolism without acute cor pulmonale: Secondary | ICD-10-CM

## 2018-05-15 DIAGNOSIS — N401 Enlarged prostate with lower urinary tract symptoms: Secondary | ICD-10-CM

## 2018-05-15 DIAGNOSIS — R7309 Other abnormal glucose: Secondary | ICD-10-CM

## 2018-05-15 DIAGNOSIS — E782 Mixed hyperlipidemia: Secondary | ICD-10-CM

## 2018-05-15 DIAGNOSIS — R351 Nocturia: Secondary | ICD-10-CM

## 2018-05-15 DIAGNOSIS — I10 Essential (primary) hypertension: Secondary | ICD-10-CM

## 2018-05-15 DIAGNOSIS — F3341 Major depressive disorder, recurrent, in partial remission: Secondary | ICD-10-CM

## 2018-05-15 DIAGNOSIS — Z7901 Long term (current) use of anticoagulants: Secondary | ICD-10-CM

## 2018-05-15 NOTE — Telephone Encounter (Signed)
Pt need prescription for 2nd shingle shot   Will pick up at appt on 11/4

## 2018-05-15 NOTE — Telephone Encounter (Signed)
Made a reminder for this and will check with patient and place order on 11/4 to give him printed rx.  Saralyn Pilar, DO [facility] 05/15/2018, 5:30 PM

## 2018-05-20 ENCOUNTER — Other Ambulatory Visit: Payer: Medicare Other

## 2018-05-20 DIAGNOSIS — F5101 Primary insomnia: Secondary | ICD-10-CM

## 2018-05-21 LAB — CBC WITH DIFFERENTIAL/PLATELET
Basophils Absolute: 0 10*3/uL (ref 0.0–0.2)
Basos: 1 %
EOS (ABSOLUTE): 0 10*3/uL (ref 0.0–0.4)
Eos: 1 %
HEMOGLOBIN: 14.9 g/dL (ref 13.0–17.7)
Hematocrit: 43.8 % (ref 37.5–51.0)
IMMATURE GRANS (ABS): 0 10*3/uL (ref 0.0–0.1)
IMMATURE GRANULOCYTES: 0 %
LYMPHS: 49 %
Lymphocytes Absolute: 3.2 10*3/uL — ABNORMAL HIGH (ref 0.7–3.1)
MCH: 36.4 pg — ABNORMAL HIGH (ref 26.6–33.0)
MCHC: 34 g/dL (ref 31.5–35.7)
MCV: 107 fL — ABNORMAL HIGH (ref 79–97)
MONOCYTES: 8 %
Monocytes Absolute: 0.5 10*3/uL (ref 0.1–0.9)
NEUTROS PCT: 41 %
Neutrophils Absolute: 2.6 10*3/uL (ref 1.4–7.0)
PLATELETS: 192 10*3/uL (ref 150–450)
RBC: 4.09 x10E6/uL — AB (ref 4.14–5.80)
RDW: 12.7 % (ref 12.3–15.4)
WBC: 6.3 10*3/uL (ref 3.4–10.8)

## 2018-05-21 LAB — LIPID PANEL
Chol/HDL Ratio: 1.4 ratio (ref 0.0–5.0)
Cholesterol, Total: 189 mg/dL (ref 100–199)
HDL: 135 mg/dL (ref >39)
LDL Calculated: 25 mg/dL (ref 0–99)
Triglycerides: 147 mg/dL (ref 0–149)
VLDL Cholesterol Cal: 29 mg/dL (ref 5–40)

## 2018-05-21 LAB — COMPREHENSIVE METABOLIC PANEL
A/G RATIO: 1.6 (ref 1.2–2.2)
ALK PHOS: 97 IU/L (ref 39–117)
ALT: 17 IU/L (ref 0–44)
AST: 52 IU/L — ABNORMAL HIGH (ref 0–40)
Albumin: 4.2 g/dL (ref 3.5–4.8)
BILIRUBIN TOTAL: 0.9 mg/dL (ref 0.0–1.2)
BUN/Creatinine Ratio: 12 (ref 10–24)
BUN: 10 mg/dL (ref 8–27)
CHLORIDE: 98 mmol/L (ref 96–106)
CO2: 26 mmol/L (ref 20–29)
Calcium: 9.4 mg/dL (ref 8.6–10.2)
Creatinine, Ser: 0.86 mg/dL (ref 0.76–1.27)
GFR calc Af Amer: 97 mL/min/{1.73_m2} (ref >59)
GFR calc non Af Amer: 84 mL/min/{1.73_m2} (ref >59)
GLUCOSE: 108 mg/dL — AB (ref 65–99)
Globulin, Total: 2.7 g/dL (ref 1.5–4.5)
POTASSIUM: 4.3 mmol/L (ref 3.5–5.2)
Sodium: 142 mmol/L (ref 134–144)
Total Protein: 6.9 g/dL (ref 6.0–8.5)

## 2018-05-21 LAB — HEMOGLOBIN A1C
ESTIMATED AVERAGE GLUCOSE: 97 mg/dL
HEMOGLOBIN A1C: 5 % (ref 4.8–5.6)

## 2018-05-21 LAB — PSA: PROSTATE SPECIFIC AG, SERUM: 1 ng/mL (ref 0.0–4.0)

## 2018-05-21 LAB — T4, FREE: Free T4: 0.92 ng/dL (ref 0.82–1.77)

## 2018-05-21 LAB — TSH: TSH: 1.37 u[IU]/mL (ref 0.450–4.500)

## 2018-05-21 MED ORDER — ZOLPIDEM TARTRATE 10 MG PO TABS: 10.0000 mg | ORAL_TABLET | Freq: Every evening | ORAL | 2 refills | Status: DC | PRN

## 2018-05-27 ENCOUNTER — Ambulatory Visit (INDEPENDENT_AMBULATORY_CARE_PROVIDER_SITE_OTHER): Payer: Medicare Other | Admitting: Family Medicine

## 2018-05-27 ENCOUNTER — Encounter: Payer: Self-pay | Admitting: Family Medicine

## 2018-05-27 VITALS — BP 123/83 | HR 57 | Temp 98.0°F | Resp 16 | Ht 76.0 in | Wt 248.0 lb

## 2018-05-27 DIAGNOSIS — Z23 Encounter for immunization: Secondary | ICD-10-CM | POA: Diagnosis not present

## 2018-05-27 DIAGNOSIS — Z0000 Encounter for general adult medical examination without abnormal findings: Secondary | ICD-10-CM | POA: Diagnosis not present

## 2018-05-27 DIAGNOSIS — R351 Nocturia: Secondary | ICD-10-CM

## 2018-05-27 DIAGNOSIS — F5101 Primary insomnia: Secondary | ICD-10-CM

## 2018-05-27 DIAGNOSIS — R600 Localized edema: Secondary | ICD-10-CM | POA: Diagnosis not present

## 2018-05-27 DIAGNOSIS — N401 Enlarged prostate with lower urinary tract symptoms: Secondary | ICD-10-CM

## 2018-05-27 DIAGNOSIS — F3341 Major depressive disorder, recurrent, in partial remission: Secondary | ICD-10-CM | POA: Diagnosis not present

## 2018-05-27 DIAGNOSIS — Z7901 Long term (current) use of anticoagulants: Secondary | ICD-10-CM

## 2018-05-27 MED ORDER — ZOSTER VAC RECOMB ADJUVANTED 50 MCG/0.5ML IM SUSR: INTRAMUSCULAR | 1 refills | Status: DC

## 2018-05-27 NOTE — Patient Instructions (Addendum)
Thank you for coming to the office today.  Keep up the good work overall.  Based on scores, mood is improved.  Continue on Ambien.  No changes to medications.  Shingles Shingrix vaccine printed for you today take to pharmacy - has a refill for 2nd dose after 2 months   Please schedule a Follow-up Appointment to: Return in about 6 months (around 11/25/2018) for 6 month follow-up HTN, Mood, Insomnia.  If you have any other questions or concerns, please feel free to call the office or send a message through MyChart. You may also schedule an earlier appointment if necessary.  Additionally, you may be receiving a survey about your experience at our office within a few days to 1 week by e-mail or mail. We value your feedback.  Saralyn Pilar, DO [facility], [place]

## 2018-05-27 NOTE — Progress Notes (Signed)
Subjective:    Patient ID: SAIR FAULCON, male    DOB: 07-16-1941, 77 y.o.   MRN: [id number]  LOURDES MANNING is a 77 y.o. male presenting on 05/27/2018 for Annual Exam   HPI   Here for Annual Physical and Lab Review  FOLLOW-UPInsomnia, Chronic / History of Mood Disorder Overall improved. Continues on Ambien 10mg  nightly and Lexapro 10mg  daily. Recently refilled medicines. No new concerns. 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 Patient continues to express concern about possibility of Parkinsons, he only has L isolated hand/arm tremor with intention and movement. Denies any resting tremor or other symptoms - He admits less active and deconditioned, since retired >25 years, less active  FOLLOW-UP HTN Home BP readings normal. Today has normal BP. Current Meds - Amlodipine 5mg  daily, Metoprolol XL 25mg  daily   Reports good compliance, took meds today. Tolerating well, w/o complaints.  Lifestyle - Screening A1c 5.0, normal - fam history of DM - Screening Thyroid panel - normal TSH Free T4  PMH - NOCTURIA / Prostate Symptoms:  HYPERLIPIDEMIA: - Reports no concerns. Last lipid panel 04/2018, controlled  - Currently taking Atorvastatin 40mg , tolerating well without side effects or myalgias Lifestyle - Diet: Limited diet - Exercise: No regular exercise  History of Thrombocytopenia History of Splenectomy - Again he reports that he had plt down to 16, and describes the story of how he saw Hematology and referred to surgeon for splenectomy, resolved his problem and has had normal platelets since. He was confused about the lab result now, question answered.  Previously followed by Heme/Onc, treated with Prednisone in past, had issues with low plt then treated prednisone, had improved platelets. Previously decision to refer to Dr Michela Pitcher for splenectomy - they  never did find an exact cause for his thrombocytopenia as a result, because spleen was not abnormal on exam.  History of Recurrent PE/DVT, on anticoagulation lifelong - No changes or update - Continues to take Xarelto daily for anticoagulation  Health Maintenance: Due for Shingrix vaccine. History of Zostavax, requesting a printed order for Shingrix 0.5mg  mL IM once and then repeat in >2 months.  Colon CA Screening: Last Colonoscopy 12/27/2010 (done by [facility]), results reported "normal" but I do not have the report available.  UTD Flu Vaccine 04/19/18  Depression screen [facility] 2/9 05/27/2018 02/18/2018 11/19/2017  Decreased Interest 0 3 3  Down, Depressed, Hopeless 0 1 2  PHQ - 2 Score 0 4 5  Altered sleeping 3 3 3   Tired, decreased energy 2 3 3   Change in appetite 0 2 1  Feeling bad or failure about yourself  0 1 0  Trouble concentrating 0 0 0  Moving slowly or fidgety/restless 2 1 2   Suicidal thoughts 0 0 0  PHQ-9 Score 7 14 14   Difficult doing work/chores Not difficult at all Somewhat difficult Somewhat difficult  Some recent data might be hidden    Past Medical History:  Diagnosis Date  . Anxiety   . Essential hypertension   . Pulmonary emboli (HCC)   . 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 LEFT HEART CATHETERIZATION WITH CORONARY ANGIOGRAM Bilateral 07/27/2014   Procedure: LEFT HEART CATHETERIZATION WITH CORONARY ANGIOGRAM;  Surgeon: Runell Gess, MD;  Location: [facility] CATH LAB;  Service: Cardiovascular;  Laterality: Bilateral;  . SPLENECTOMY, TOTAL  TTP   Social History   Socioeconomic History  . Marital status: Single    Spouse name: Not on file  . Number of children: Not on file  . Years of education: Not on file  . Highest education level: Not on file  Occupational History  . Occupation: Retired Water quality scientist)  Social Needs  . Financial resource strain: Not on file  . Food insecurity:    Worry: Not on file     Inability: Not on file  . Transportation needs:    Medical: Not on file    Non-medical: Not on file  Tobacco Use  . Smoking status: Former Smoker    Years: 20.00    Types: Cigarettes    Last attempt to quit: 07/24/1978    Years since quitting: 39.8  . Smokeless tobacco: Former Neurosurgeon  . Tobacco comment: Quit tobacco in 1980  Substance and Sexual Activity  . Alcohol use: Yes    Alcohol/week: 3.0 standard drinks    Types: 3 Shots of liquor per week    Comment: daily  . Drug use: No  . Sexual activity: Yes    Birth control/protection: None  Lifestyle  . Physical activity:    Days per week: Not on file    Minutes per session: Not on file  . Stress: Not on file  Relationships  . Social connections:    Talks on phone: Not on file    Gets together: Not on file    Attends religious service: Not on file    Active member of club or organization: Not on file    Attends meetings of clubs or organizations: Not on file    Relationship status: Not on file  . Intimate partner violence:    Fear of current or ex partner: Not on file    Emotionally abused: Not on file    Physically abused: Not on file    Forced sexual activity: Not on file  Other Topics Concern  . Not on file  Social History Narrative   Retired from [employer]. Retired x 20 years.  Two children.     Family History  Problem Relation Age of Onset  . CAD Father 30  . Kidney failure Mother   . CAD Brother    Current Outpatient Medications on File Prior to Visit  Medication Sig  . amLODipine (NORVASC) 5 MG tablet TAKE 1 TABLET BY MOUTH  DAILY  . atorvastatin (LIPITOR) 40 MG tablet TAKE 1 TABLET BY MOUTH  DAILY AT 6 PM.  . cholecalciferol (VITAMIN D) 1000 UNITS tablet Take 1,000 Units by mouth daily.  Marland Kitchen escitalopram (LEXAPRO) 10 MG tablet TAKE 1 TABLET BY MOUTH  DAILY  . losartan (COZAAR) 100 MG tablet TAKE 1 TABLET BY MOUTH  EVERY DAY FOR BLOOD  PRESSURE  . Melatonin 5 MG TABS Take 10 mg by mouth at bedtime.  3 tabs  . metoprolol succinate (TOPROL-XL) 25 MG 24 hr tablet TAKE 1 TABLET BY MOUTH  DAILY  . omeprazole (PRILOSEC) 40 MG capsule TAKE 1 CAPSULE DAILY BY  MOUTH.  . sildenafil (REVATIO) 20 MG tablet Take 1-5 pills about 30 min prior to sex. Start with 1 and increase as needed.  . vitamin B-12 (CYANOCOBALAMIN) 500 MCG tablet Take 500 mcg by mouth daily.  . vitamin E 1000 UNIT capsule Take 1,000 Units by mouth daily.  Carlena Hurl 20 MG TABS tablet TAKE 1 TABLET BY MOUTH  DAILY WITH SUPPER  . zolpidem (AMBIEN)  10 MG tablet Take 1 tablet (10 mg total) by mouth at bedtime as needed for sleep.   No current facility-administered medications on file prior to visit.     Review of Systems  Constitutional: Negative for activity change, appetite change, chills, diaphoresis, fatigue and fever.  HENT: Negative for congestion and hearing loss.   Eyes: Negative for visual disturbance.  Respiratory: Negative for apnea, cough, choking, chest tightness, shortness of breath and wheezing.   Cardiovascular: Negative for chest pain, palpitations and leg swelling.  Gastrointestinal: Negative for abdominal pain, anal bleeding, blood in stool, constipation, diarrhea, nausea and vomiting.  Endocrine: Negative for cold intolerance.  Genitourinary: Negative for difficulty urinating, dysuria, frequency and hematuria.  Musculoskeletal: Negative for arthralgias, back pain and neck pain.  Skin: Negative for rash.  Allergic/Immunologic: Negative for environmental allergies.  Neurological: Positive for tremors. Negative for dizziness, weakness, light-headedness, numbness and headaches.  Hematological: Negative for adenopathy.  Psychiatric/Behavioral: Positive for dysphoric mood (improved) and sleep disturbance (improved). Negative for behavioral problems. The patient is not nervous/anxious.    Per HPI unless specifically indicated above     Objective:    BP 123/83   Pulse (!) 57   Temp 98 F (36.7 C) (Oral)   Resp  16   Ht 6\' 4"  (1.93 m)   Wt 248 lb (112.5 kg)   BMI 30.19 kg/m   Wt Readings from Last 3 Encounters:  05/27/18 248 lb (112.5 kg)  02/18/18 250 lb (113.4 kg)  11/19/17 269 lb 12.8 oz (122.4 kg)    Physical Exam  Constitutional: He is oriented to person, place, and time. He appears well-developed and well-nourished. No distress.  Well-appearing, comfortable, cooperative, obese  HENT:  Head: Normocephalic and atraumatic.  Mouth/Throat: Oropharynx is clear and moist.  Frontal / maxillary sinuses non-tender. Nares patent without purulence or edema. R TM clear without erythema, effusion or bulging. LEFT Ear mild effusion without bulging. Oropharynx clear without erythema, exudates, edema or asymmetry.  Eyes: Pupils are equal, round, and reactive to light. Conjunctivae and EOM are normal. Right eye exhibits no discharge. Left eye exhibits no discharge.  Neck: Normal range of motion. Neck supple. No thyromegaly present.  Carotid bruits mild bilateral from radiation  Cardiovascular: Normal rate, regular rhythm and intact distal pulses.  Murmur (2/6 systolic murmur over apex w/ radiation to carotids) heard. Pulmonary/Chest: Effort normal and breath sounds normal. No respiratory distress. He has no wheezes. He has no rales.  Abdominal: Soft. Bowel sounds are normal. He exhibits no distension and no mass. There is no tenderness.  Musculoskeletal: Normal range of motion. He exhibits edema (trace to +1 or improved edema bilateral, some varicose veins). He exhibits no tenderness.  Upper / Lower Extremities: - Normal muscle tone, strength bilateral upper extremities 5/5, lower extremities 5/5  Lymphadenopathy:    He has no cervical adenopathy.  Neurological: He is alert and oriented to person, place, and time.  Distal sensation intact to light touch all extremities  No resting tremors. Some intention or tremor L hand wrist only fine tremor on movement when he demonstrates  Skin: Skin is warm and  dry. No rash noted. He is not diaphoretic. No erythema.  Psychiatric: He has a normal mood and affect. His behavior is normal.  Well groomed, good eye contact, normal speech and thoughts  Nursing note and vitals reviewed.  Results for orders placed or performed in visit on 05/15/18  T4, free  Result Value Ref Range   Free T4 0.92 0.82 -  1.77 ng/dL  PSA  Result Value Ref Range   Prostate Specific Ag, Serum 1.0 0.0 - 4.0 ng/mL  Comprehensive metabolic panel  Result Value Ref Range   Glucose 108 (H) 65 - 99 mg/dL   BUN 10 8 - 27 mg/dL   Creatinine, Ser 1.61 0.76 - 1.27 mg/dL   GFR calc non Af Amer 84 >59 mL/min/1.73   GFR calc Af Amer 97 >59 mL/min/1.73   BUN/Creatinine Ratio 12 10 - 24   Sodium 142 134 - 144 mmol/L   Potassium 4.3 3.5 - 5.2 mmol/L   Chloride 98 96 - 106 mmol/L   CO2 26 20 - 29 mmol/L   Calcium 9.4 8.6 - 10.2 mg/dL   Total Protein 6.9 6.0 - 8.5 g/dL   Albumin 4.2 3.5 - 4.8 g/dL   Globulin, Total 2.7 1.5 - 4.5 g/dL   Albumin/Globulin Ratio 1.6 1.2 - 2.2   Bilirubin Total 0.9 0.0 - 1.2 mg/dL   Alkaline Phosphatase 97 39 - 117 IU/L   AST 52 (H) 0 - 40 IU/L   ALT 17 0 - 44 IU/L  TSH  Result Value Ref Range   TSH 1.370 0.450 - 4.500 uIU/mL  Lipid panel  Result Value Ref Range   Cholesterol, Total 189 100 - 199 mg/dL   Triglycerides 096 0 - 149 mg/dL   HDL 045 >40 mg/dL   VLDL Cholesterol Cal 29 5 - 40 mg/dL   LDL Calculated 25 0 - 99 mg/dL   Chol/HDL Ratio 1.4 0.0 - 5.0 ratio  CBC with Differential/Platelet  Result Value Ref Range   WBC 6.3 3.4 - 10.8 x10E3/uL   RBC 4.09 (L) 4.14 - 5.80 x10E6/uL   Hemoglobin 14.9 13.0 - 17.7 g/dL   Hematocrit 98.1 19.1 - 51.0 %   MCV 107 (H) 79 - 97 fL   MCH 36.4 (H) 26.6 - 33.0 pg   MCHC 34.0 31.5 - 35.7 g/dL   RDW 47.8 29.5 - 62.1 %   Platelets 192 150 - 450 x10E3/uL   Neutrophils 41 Not Estab. %   Lymphs 49 Not Estab. %   Monocytes 8 Not Estab. %   Eos 1 Not Estab. %   Basos 1 Not Estab. %   Neutrophils Absolute  2.6 1.4 - 7.0 x10E3/uL   Lymphocytes Absolute 3.2 (H) 0.7 - 3.1 x10E3/uL   Monocytes Absolute 0.5 0.1 - 0.9 x10E3/uL   EOS (ABSOLUTE) 0.0 0.0 - 0.4 x10E3/uL   Basophils Absolute 0.0 0.0 - 0.2 x10E3/uL   Immature Granulocytes 0 Not Estab. %   Immature Grans (Abs) 0.0 0.0 - 0.1 x10E3/uL  Hemoglobin A1c  Result Value Ref Range   Hgb A1c MFr Bld 5.0 4.8 - 5.6 %   Est. average glucose Bld gHb Est-mCnc 97 mg/dL      Assessment & Plan:   Problem List Items Addressed This Visit    Bilateral lower extremity edema   Chronic anticoagulation    Stable without bleeding or complication. 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 See prior chart Failed Zoloft SSRI, Trazodone  Plan Continue on current therapy Ambien 10mg  nightly, already discussed risks and precautions, agrees to continue med Continue Lexapro 10mg  daily, melatonin      Nocturia associated with benign prostatic hyperplasia    Stable BPH  Without worsening symptoms Remain off meds currently      Recurrent major depression in partial remission (HCC)    Significantly improved Mood PHQ Improved insomnia on regular ambien See A&P Insomnia, likely secondary and factor to mood as well  Plan Continue Lexapro SSRI Continue Ambien Future reconsider therapist       Other Visit Diagnoses    Annual physical exam    -  Primary   Need for shingles vaccine       Relevant Medications   Zoster Vaccine Adjuvanted [facility]) injection      Updated Health Maintenance information Reviewed recent lab results with patient Encouraged improvement to lifestyle with diet and exercise - Goal of weight loss  Meds ordered this encounter  Medications  . Zoster Vaccine Adjuvanted [facility]) injection    Sig: Inject 0.1mL intramuscular once. Then repeat  dose after 2 months.    Dispense:  0.5 mL    Refill:  1    Follow up plan: Return in about 6 months (around 11/25/2018) for 6 month follow-up HTN, Mood, Insomnia.  Saralyn Pilar, DO [facility] 05/27/2018, 1:36 PM

## 2018-05-28 NOTE — Assessment & Plan Note (Signed)
Stable BPH Without worsening symptoms Remain off meds currently

## 2018-05-28 NOTE — Assessment & Plan Note (Signed)
Significantly improved Mood PHQ Improved insomnia on regular ambien See A&P Insomnia, likely secondary and factor to mood as well  Plan Continue Lexapro SSRI Continue Ambien Future reconsider therapist

## 2018-05-28 NOTE — Assessment & Plan Note (Signed)
Stable without bleeding or complication. Continue Xarelto 20mg daily for lifelong anticoagulation with history of b/l DVT and recurrent PE in 2016 Followed by Hematology 

## 2018-05-28 NOTE — Assessment & Plan Note (Signed)
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

## 2018-09-04 DIAGNOSIS — F5101 Primary insomnia: Secondary | ICD-10-CM

## 2018-09-05 MED ORDER — ZOLPIDEM TARTRATE 10 MG PO TABS: 10.0000 mg | ORAL_TABLET | Freq: Every evening | ORAL | 2 refills | Status: DC | PRN

## 2018-12-26 ENCOUNTER — Other Ambulatory Visit: Payer: Self-pay | Admitting: Family Medicine

## 2018-12-26 DIAGNOSIS — F5101 Primary insomnia: Secondary | ICD-10-CM

## 2018-12-26 MED ORDER — ZOLPIDEM TARTRATE 10 MG PO TABS: 10.0000 mg | ORAL_TABLET | Freq: Every evening | ORAL | 2 refills | Status: DC | PRN

## 2019-01-14 ENCOUNTER — Other Ambulatory Visit: Payer: Self-pay

## 2019-01-14 ENCOUNTER — Encounter: Payer: Self-pay | Admitting: Family Medicine

## 2019-01-14 ENCOUNTER — Ambulatory Visit: Payer: Medicare Other | Admitting: Family Medicine

## 2019-01-14 ENCOUNTER — Other Ambulatory Visit: Payer: Self-pay | Admitting: Family Medicine

## 2019-01-14 VITALS — BP 136/62 | HR 72 | Temp 98.4°F | Resp 16 | Ht 76.0 in | Wt 262.0 lb

## 2019-01-14 DIAGNOSIS — I10 Essential (primary) hypertension: Secondary | ICD-10-CM

## 2019-01-14 DIAGNOSIS — F3341 Major depressive disorder, recurrent, in partial remission: Secondary | ICD-10-CM | POA: Diagnosis not present

## 2019-01-14 DIAGNOSIS — Z0000 Encounter for general adult medical examination without abnormal findings: Secondary | ICD-10-CM

## 2019-01-14 DIAGNOSIS — Z7901 Long term (current) use of anticoagulants: Secondary | ICD-10-CM

## 2019-01-14 DIAGNOSIS — F5101 Primary insomnia: Secondary | ICD-10-CM

## 2019-01-14 DIAGNOSIS — N401 Enlarged prostate with lower urinary tract symptoms: Secondary | ICD-10-CM

## 2019-01-14 DIAGNOSIS — E782 Mixed hyperlipidemia: Secondary | ICD-10-CM

## 2019-01-14 MED ORDER — ZOLPIDEM TARTRATE 10 MG PO TABS: 10.0000 mg | ORAL_TABLET | Freq: Every evening | ORAL | 1 refills | Status: DC | PRN

## 2019-01-14 NOTE — Assessment & Plan Note (Addendum)
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 See prior chart Failed Zoloft Lexapro SSRI, Trazodone  Plan Continue on current therapy Ambien 10mg  nightly, already discussed risks and precautions, agrees to continue med - Discussed not covered med per insurance, now limited options, he can do cash pay with goodrx, sent new rx 90 day supply [facility], and printed goodrx to get discount to $25 for 90 day, he prefers this option instead of switch to alternative hypnotic sleep aid, I advised him in future if he wants to change he needs to provide list from insurance on which med is preferred or covered, we can discuss other options  Increase Lexapro from 10 to 20mg  daily and if not effective then we can consider taper down and switch to Fluoxetine if he needs

## 2019-01-14 NOTE — Assessment & Plan Note (Addendum)
Worsening mood now, seems less effective on current SSRI Improved insomnia on regular ambien See A&P Insomnia, likely secondary and factor to mood as well  Plan Advised trial at higher dose Lexapro 10 up to 20mg  - can double dose for now with existing pills, if ineffective after 1-2 weeks or need new rx, call office, we can alternatively taper down half dose for 1 week then switch to Fluoxetine if needed if prefer alternative medicine Continue Ambien Future reconsider therapist

## 2019-01-14 NOTE — Progress Notes (Signed)
Subjective:    Patient ID: Craig [name], male    DOB: 11/20/1940, 78 y.o.   MRN: [id number]  Craig [name] is a 78 y.o. male presenting on 01/14/2019 for Insomnia   HPI   FOLLOW-UPInsomnia, Chronic / Recurrent Major Depression in Partial Remission - Last visit with me 05/2018, for physical and reviewed same problem, treated with continued Ambien 10mg  nightly for chronic insomnia, also with SSRI Lexapro, see prior notes for background information. - Interval update with still [place] but now insurance no longer covering it - Today patient reports ask for way to cover ambien, or to switch med to something that insurance covers, it is 75% effective he prefer to keep taking it nightly, he has mild groggy symptoms in morning describes as briefly "loopy" feeling but it resolves promptly, he has not had any problems with sleep walking or other complications, tolerates medicine well helps him rest, he has declined sleep specialist referral in past well - Also taking SSRI. Currently Lexapro 10mg  daily for past >9 months, had been working by his report before now says it is ineffective. He has also failed Sertraline in past. - He is asking about alternative med options - See PHQ - Failed past meds Trazodone, Zoloft SSRI Denies suicidal or homicidal ideation,   [facility] 01/14/2019 05/27/2018 02/18/2018  Decreased Interest 3 0 3  Down, Depressed, Hopeless 2 0 1  PHQ - 2 Score 5 0 4  Altered sleeping 3 3 3   Tired, decreased energy 3 2 3   Change in appetite 1 0 2  Feeling bad or failure about yourself  0 0 1  Trouble concentrating 0 0 0  Moving slowly or fidgety/restless 1 2 1   Suicidal thoughts 0 0 0  PHQ-9 Score 13 7 14   Difficult doing work/chores Not difficult at all Not difficult at all Somewhat difficult  Some recent data might be hidden   GAD 7 : Generalized Anxiety Score 12/27/2016  Nervous, Anxious, on Edge 0  Control/stop worrying 0  Worry too  much - different things 1  Trouble relaxing 0  Restless 0  Easily annoyed or irritable 0  Afraid - awful might happen 0  Total GAD 7 Score 1  Anxiety Difficulty Not difficult at all     Social History   Tobacco Use  . Smoking status: Former Smoker    Years: 20.00    Types: Cigarettes    Quit date: 07/24/1978    Years since quitting: 40.5  . Smokeless tobacco: Former NeurosurgeonUser  . Tobacco comment: Quit tobacco in 1980  Substance Use Topics  . Alcohol use: Yes    Alcohol/week: 3.0 standard drinks    Types: 3 Shots of liquor per week    Comment: daily  . Drug use: No    Review of Systems Per HPI unless specifically indicated above     Objective:    BP 136/62   Pulse 72   Temp 98.4 F (36.9 C) (Oral)   Resp 16   Ht 6\' 4"  (1.93 m)   Wt 262 lb (118.8 kg)   BMI 31.89 kg/m   Wt Readings from Last 3 Encounters:  01/14/19 262 lb (118.8 kg)  05/27/18 248 lb (112.5 kg)  02/18/18 250 lb (113.4 kg)    Physical Exam Vitals signs and nursing note reviewed.  Constitutional:      General: He is not in acute distress.    Appearance: He is well-developed. He is  not diaphoretic.     Comments: Well-appearing, comfortable, cooperative  HENT:     Head: Normocephalic and atraumatic.  Eyes:     General:        Right eye: No discharge.        Left eye: No discharge.     Conjunctiva/sclera: Conjunctivae normal.  Neck:     Musculoskeletal: Normal range of motion and neck supple.     Thyroid: No thyromegaly.  Cardiovascular:     Rate and Rhythm: Normal rate and regular rhythm.     Heart sounds: Normal heart sounds. No murmur.  Pulmonary:     Effort: Pulmonary effort is normal. No respiratory distress.     Breath sounds: Normal breath sounds. No wheezing or rales.  Musculoskeletal: Normal range of motion.  Lymphadenopathy:     Cervical: No cervical adenopathy.  Skin:    General: Skin is warm and dry.     Findings: No erythema or rash.  Neurological:     Mental Status: He is  alert and oriented to person, place, and time.  Psychiatric:        Behavior: Behavior normal.     Comments: Well groomed, good eye contact, normal speech and thoughts       Results for orders placed or performed in visit on 05/15/18  T4, free  Result Value Ref Range   Free T4 0.92 0.82 - 1.77 ng/dL  PSA  Result Value Ref Range   Prostate Specific Ag, Serum 1.0 0.0 - 4.0 ng/mL  Comprehensive metabolic panel  Result Value Ref Range   Glucose 108 (H) 65 - 99 mg/dL   BUN 10 8 - 27 mg/dL   Creatinine, Ser 0.86 0.76 - 1.27 mg/dL   GFR calc non Af Amer 84 >59 mL/min/1.73   GFR calc Af Amer 97 >59 mL/min/1.73   BUN/Creatinine Ratio 12 10 - 24   Sodium 142 134 - 144 mmol/L   Potassium 4.3 3.5 - 5.2 mmol/L   Chloride 98 96 - 106 mmol/L   CO2 26 20 - 29 mmol/L   Calcium 9.4 8.6 - 10.2 mg/dL   Total Protein 6.9 6.0 - 8.5 g/dL   Albumin 4.2 3.5 - 4.8 g/dL   Globulin, Total 2.7 1.5 - 4.5 g/dL   Albumin/Globulin Ratio 1.6 1.2 - 2.2   Bilirubin Total 0.9 0.0 - 1.2 mg/dL   Alkaline Phosphatase 97 39 - 117 IU/L   AST 52 (H) 0 - 40 IU/L   ALT 17 0 - 44 IU/L  TSH  Result Value Ref Range   TSH 1.370 0.450 - 4.500 uIU/mL  Lipid panel  Result Value Ref Range   Cholesterol, Total 189 100 - 199 mg/dL   Triglycerides 147 0 - 149 mg/dL   HDL 135 >39 mg/dL   VLDL Cholesterol Cal 29 5 - 40 mg/dL   LDL Calculated 25 0 - 99 mg/dL   Chol/HDL Ratio 1.4 0.0 - 5.0 ratio  CBC with Differential/Platelet  Result Value Ref Range   WBC 6.3 3.4 - 10.8 x10E3/uL   RBC 4.09 (L) 4.14 - 5.80 x10E6/uL   Hemoglobin 14.9 13.0 - 17.7 g/dL   Hematocrit 43.8 37.5 - 51.0 %   MCV 107 (H) 79 - 97 fL   MCH 36.4 (H) 26.6 - 33.0 pg   MCHC 34.0 31.5 - 35.7 g/dL   RDW 12.7 12.3 - 15.4 %   Platelets 192 150 - 450 x10E3/uL   Neutrophils 41 Not Estab. %   Lymphs  49 Not Estab. %   Monocytes 8 Not Estab. %   Eos 1 Not Estab. %   Basos 1 Not Estab. %   Neutrophils Absolute 2.6 1.4 - 7.0 x10E3/uL   Lymphocytes  Absolute 3.2 (H) 0.7 - 3.1 x10E3/uL   Monocytes Absolute 0.5 0.1 - 0.9 x10E3/uL   EOS (ABSOLUTE) 0.0 0.0 - 0.4 x10E3/uL   Basophils Absolute 0.0 0.0 - 0.2 x10E3/uL   Immature Granulocytes 0 Not Estab. %   Immature Grans (Abs) 0.0 0.0 - 0.1 x10E3/uL  Hemoglobin A1c  Result Value Ref Range   Hgb A1c MFr Bld 5.0 4.8 - 5.6 %   Est. average glucose Bld gHb Est-mCnc 97 mg/dL      Assessment & Plan:   Problem List Items Addressed This Visit    Insomnia    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 See prior chart Failed Zoloft Lexapro SSRI, Trazodone  Plan Continue on current therapy Ambien 10mg  nightly, already discussed risks and precautions, agrees to continue med - Discussed not covered med per insurance, now limited options, he can do cash pay with goodrx, sent new rx 90 day supply [facility], and printed goodrx to get discount to $25 for 90 day, he prefers this option instead of switch to alternative hypnotic sleep aid, I advised him in future if he wants to change he needs to provide list from insurance on which med is preferred or covered, we can discuss other options  Increase Lexapro from 10 to 20mg  daily and if not effective then we can consider taper down and switch to Fluoxetine if he needs      Relevant Medications   zolpidem (AMBIEN) 10 MG tablet   Recurrent major depression in partial remission (HCC) - Primary    Worsening mood now, seems less effective on current SSRI Improved insomnia on regular ambien See A&P Insomnia, likely secondary and factor to mood as well  Plan Advised trial at higher dose Lexapro 10 up to 20mg  - can double dose for now with existing pills, if ineffective after 1-2 weeks or need new rx, call office, we can alternatively taper down half dose for 1 week then switch to Fluoxetine if needed if prefer alternative medicine Continue Ambien Future reconsider therapist          Meds ordered this encounter   Medications  . zolpidem (AMBIEN) 10 MG tablet    Sig: Take 1 tablet (10 mg total) by mouth at bedtime as needed for sleep.    Dispense:  90 tablet    Refill:  1    Patient opt to use cash pay, goodrx, since insurance no longer covering    Follow up plan: Return in about 6 months (around 07/16/2019) for Annual Physical.  Future labs ordered for 06/2019  Saralyn PilarAlexander Nicole Defino, DO [facility] 01/14/2019, 1:33 PM

## 2019-01-14 NOTE — Patient Instructions (Addendum)
Thank you for coming to the office today.  In future - can use - www.goodrx.com - look up ambien 10mg  90 pills, select [facility] print coupon.  Also if interested in switching med - since insurance not covering this one, ask them for a list of alternatives, and we can review it together to find a new medicine if you want.  - Try to increase Lexapro dose from 10mg  up to 2 pills at once = 20mg  daily, for 1-2 weeks, if you do not have enough supply of medicine at home, or it is ineffective, we will go ahead and change to alternative medicine, such as Fluoxetine (generic prozac)  Call within 1-2 weeks, if need new medicine, taper instructions ( usually half pill or half dose for 1 week then can switch to new medicine)  Or if doing well on lexapro 20mg   We can order new rx 20mg  tablets  DUE for FASTING BLOOD WORK (no food or drink after midnight before the lab appointment, only water or coffee without cream/sugar on the morning of)  SCHEDULE "Lab Only" visit in the morning at the clinic for lab draw in 6 MONTHS   - Make sure Lab Only appointment is at about 1 week before your next appointment, so that results will be available  For Lab Results, once available within 2-3 days of blood draw, you can can log in to MyChart online to view your results and a brief explanation. Also, we can discuss results at next follow-up visit.   Please schedule a Follow-up Appointment to: Return in about 6 months (around 07/16/2019) for Annual Physical.  If you have any other questions or concerns, please feel free to call the office or send a message through [place]. You may also schedule an earlier appointment if necessary.  Additionally, you may be receiving a survey about your experience at our office within a few days to 1 week by e-mail or mail. We value your feedback.  Nobie Putnam, DO [place]

## 2019-03-22 ENCOUNTER — Other Ambulatory Visit: Payer: Self-pay | Admitting: Family Medicine

## 2019-03-22 DIAGNOSIS — F5101 Primary insomnia: Secondary | ICD-10-CM

## 2019-03-22 DIAGNOSIS — F331 Major depressive disorder, recurrent, moderate: Secondary | ICD-10-CM

## 2019-03-26 DIAGNOSIS — F331 Major depressive disorder, recurrent, moderate: Secondary | ICD-10-CM

## 2019-03-26 DIAGNOSIS — F5101 Primary insomnia: Secondary | ICD-10-CM

## 2019-03-26 MED ORDER — ESCITALOPRAM OXALATE 20 MG PO TABS: 20.0000 mg | ORAL_TABLET | Freq: Every day | ORAL | 3 refills | Status: DC

## 2019-06-06 ENCOUNTER — Other Ambulatory Visit: Payer: Self-pay | Admitting: Family Medicine

## 2019-06-06 DIAGNOSIS — K219 Gastro-esophageal reflux disease without esophagitis: Secondary | ICD-10-CM

## 2019-06-12 ENCOUNTER — Other Ambulatory Visit: Payer: Self-pay | Admitting: Family Medicine

## 2019-06-12 DIAGNOSIS — E785 Hyperlipidemia, unspecified: Secondary | ICD-10-CM

## 2019-06-12 DIAGNOSIS — I824Y3 Acute embolism and thrombosis of unspecified deep veins of proximal lower extremity, bilateral: Secondary | ICD-10-CM

## 2019-06-12 DIAGNOSIS — I2699 Other pulmonary embolism without acute cor pulmonale: Secondary | ICD-10-CM

## 2019-06-12 DIAGNOSIS — I10 Essential (primary) hypertension: Secondary | ICD-10-CM

## 2019-06-23 ENCOUNTER — Telehealth: Payer: Self-pay

## 2019-06-23 DIAGNOSIS — R351 Nocturia: Secondary | ICD-10-CM

## 2019-06-23 DIAGNOSIS — I10 Essential (primary) hypertension: Secondary | ICD-10-CM

## 2019-06-23 DIAGNOSIS — Z0000 Encounter for general adult medical examination without abnormal findings: Secondary | ICD-10-CM

## 2019-06-23 DIAGNOSIS — Z7901 Long term (current) use of anticoagulants: Secondary | ICD-10-CM

## 2019-06-23 DIAGNOSIS — E782 Mixed hyperlipidemia: Secondary | ICD-10-CM

## 2019-06-23 DIAGNOSIS — N401 Enlarged prostate with lower urinary tract symptoms: Secondary | ICD-10-CM

## 2019-06-23 DIAGNOSIS — F5101 Primary insomnia: Secondary | ICD-10-CM

## 2019-06-23 NOTE — Addendum Note (Signed)
Addended by: Quadre Bristol L on: 06/23/2019 12:00 PM   Modules accepted: Orders  

## 2019-06-23 NOTE — Telephone Encounter (Signed)
Pt requested to change his labs over from [facility] to [facility].

## 2019-06-23 NOTE — Telephone Encounter (Signed)
Pt requesting his lab order to be changed over to [facility].

## 2019-06-23 NOTE — Addendum Note (Signed)
Addended by: RUSSELL, KELITA L on: 06/23/2019 12:00 PM   Modules accepted: Orders  

## 2019-06-23 NOTE — Addendum Note (Signed)
Addended by: Frona Yost L on: 06/23/2019 12:00 PM   Modules accepted: Orders  

## 2019-06-23 NOTE — Addendum Note (Signed)
Addended by: Wilson Singer on: 06/23/2019 12:00 PM   Modules accepted: Orders

## 2019-06-25 LAB — CBC WITH DIFFERENTIAL/PLATELET
Basophils Absolute: 0 10*3/uL (ref 0.0–0.2)
Basos: 0 %
EOS (ABSOLUTE): 0 10*3/uL (ref 0.0–0.4)
Eos: 1 %
Hematocrit: 40.4 % (ref 37.5–51.0)
Hemoglobin: 14.3 g/dL (ref 13.0–17.7)
Immature Grans (Abs): 0 10*3/uL (ref 0.0–0.1)
Immature Granulocytes: 0 %
Lymphocytes Absolute: 2.5 10*3/uL (ref 0.7–3.1)
Lymphs: 40 %
MCH: 40.2 pg — ABNORMAL HIGH (ref 26.6–33.0)
MCHC: 35.4 g/dL (ref 31.5–35.7)
MCV: 114 fL — ABNORMAL HIGH (ref 79–97)
Monocytes Absolute: 0.8 10*3/uL (ref 0.1–0.9)
Monocytes: 12 %
NRBC: 1 % — ABNORMAL HIGH (ref 0–0)
Neutrophils Absolute: 2.9 10*3/uL (ref 1.4–7.0)
Neutrophils: 47 %
Platelets: 215 10*3/uL (ref 150–450)
RBC: 3.56 x10E6/uL — ABNORMAL LOW (ref 4.14–5.80)
RDW: 13.3 % (ref 11.6–15.4)
WBC: 6.2 10*3/uL (ref 3.4–10.8)

## 2019-06-25 LAB — COMPREHENSIVE METABOLIC PANEL
ALT: 28 IU/L (ref 0–44)
AST: 83 IU/L — ABNORMAL HIGH (ref 0–40)
Albumin/Globulin Ratio: 1.9 (ref 1.2–2.2)
Albumin: 4 g/dL (ref 3.7–4.7)
Alkaline Phosphatase: 107 IU/L (ref 39–117)
BUN/Creatinine Ratio: 9 — ABNORMAL LOW (ref 10–24)
BUN: 8 mg/dL (ref 8–27)
Bilirubin Total: 0.9 mg/dL (ref 0.0–1.2)
CO2: 24 mmol/L (ref 20–29)
Calcium: 9.4 mg/dL (ref 8.6–10.2)
Chloride: 104 mmol/L (ref 96–106)
Creatinine, Ser: 0.88 mg/dL (ref 0.76–1.27)
GFR calc Af Amer: 96 mL/min/{1.73_m2} (ref >59)
GFR calc non Af Amer: 83 mL/min/{1.73_m2} (ref >59)
Globulin, Total: 2.1 g/dL (ref 1.5–4.5)
Glucose: 119 mg/dL — ABNORMAL HIGH (ref 65–99)
Potassium: 4.2 mmol/L (ref 3.5–5.2)
Sodium: 146 mmol/L — ABNORMAL HIGH (ref 134–144)
Total Protein: 6.1 g/dL (ref 6.0–8.5)

## 2019-06-25 LAB — HEMOGLOBIN A1C
Est. average glucose Bld gHb Est-mCnc: 97 mg/dL
Hgb A1c MFr Bld: 5 % (ref 4.8–5.6)

## 2019-06-25 LAB — TSH: TSH: 0.721 u[IU]/mL (ref 0.450–4.500)

## 2019-06-25 LAB — LIPID PANEL
Chol/HDL Ratio: 1.7 ratio (ref 0.0–5.0)
Cholesterol, Total: 181 mg/dL (ref 100–199)
HDL: 109 mg/dL (ref >39)
LDL Chol Calc (NIH): 30 mg/dL (ref 0–99)
Triglycerides: 300 mg/dL — ABNORMAL HIGH (ref 0–149)
VLDL Cholesterol Cal: 42 mg/dL — ABNORMAL HIGH (ref 5–40)

## 2019-06-25 LAB — PSA: Prostate Specific Ag, Serum: 1.1 ng/mL (ref 0.0–4.0)

## 2019-07-08 ENCOUNTER — Other Ambulatory Visit: Payer: Self-pay

## 2019-07-08 ENCOUNTER — Encounter: Payer: Self-pay | Admitting: Family Medicine

## 2019-07-08 ENCOUNTER — Ambulatory Visit (INDEPENDENT_AMBULATORY_CARE_PROVIDER_SITE_OTHER): Payer: Medicare Other | Admitting: Family Medicine

## 2019-07-08 VITALS — BP 123/60 | HR 56 | Temp 98.4°F | Resp 16 | Ht 76.0 in | Wt 261.0 lb

## 2019-07-08 DIAGNOSIS — Z86718 Personal history of other venous thrombosis and embolism: Secondary | ICD-10-CM

## 2019-07-08 DIAGNOSIS — Z789 Other specified health status: Secondary | ICD-10-CM

## 2019-07-08 DIAGNOSIS — I10 Essential (primary) hypertension: Secondary | ICD-10-CM

## 2019-07-08 DIAGNOSIS — E782 Mixed hyperlipidemia: Secondary | ICD-10-CM

## 2019-07-08 DIAGNOSIS — I2699 Other pulmonary embolism without acute cor pulmonale: Secondary | ICD-10-CM

## 2019-07-08 DIAGNOSIS — Z7289 Other problems related to lifestyle: Secondary | ICD-10-CM

## 2019-07-08 DIAGNOSIS — Z7901 Long term (current) use of anticoagulants: Secondary | ICD-10-CM

## 2019-07-08 DIAGNOSIS — Z0000 Encounter for general adult medical examination without abnormal findings: Secondary | ICD-10-CM

## 2019-07-08 DIAGNOSIS — F3341 Major depressive disorder, recurrent, in partial remission: Secondary | ICD-10-CM

## 2019-07-08 DIAGNOSIS — R7401 Elevation of levels of liver transaminase levels: Secondary | ICD-10-CM | POA: Diagnosis not present

## 2019-07-08 DIAGNOSIS — F5101 Primary insomnia: Secondary | ICD-10-CM

## 2019-07-08 NOTE — Patient Instructions (Addendum)
Thank you for coming to the office today.  Keep up the good work Goal to scale back some of the alcohol amount or not every day if possible, some mild elevated Liver Enzyme today and Triglycerides.  Continue current medications.  BP is controlled.  Sugar is excellent, no sign of prediabetes.  If decide to pursue back x-ray lumbar spine or referral to orthopedic, let me know by message or phone and we can refer.  Please schedule a Follow-up Appointment to: Return in about 6 months (around 01/06/2020) for 6 month follow-up HTN, HLD, Mood/Insomnia.  If you have any other questions or concerns, please feel free to call the office or send a message through [place]. You may also schedule an earlier appointment if necessary.  Additionally, you may be receiving a survey about your experience at our office within a few days to 1 week by e-mail or mail. We value your feedback.  Nobie Putnam, DO [place]

## 2019-07-08 NOTE — Progress Notes (Signed)
Subjective:    Patient ID: Craig [name], male    DOB: 11-20-1940, 78 y.o.   MRN: [id number]  Craig [name] is a 78 y.o. male presenting on 07/08/2019 for Annual Exam   HPI  Here for Annual Physical and Lab Review  Alcohol Consumption / Elevated AST LFT He drinks alcohol regularly 2 oz 100 proof vodka Last lab result 06/24/19 was 83, prior result 52 (04/2018) otherwise previously normal including ALT  FOLLOW-UPInsomnia, Chronic / Recurrent Major Depression in Partial Remission Last visit dose increased Lexapro from 10 up to 20 (02/2019) He is doing well on this dose. 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 Patient continues to express concern about possibility of Parkinsons, he only has L isolated hand/arm tremor with intention and movement. Denies any resting tremor or other symptoms  FOLLOW-UP HTN Home BP readings normal. Today has normal BP. Current Meds -Amlodipine 5mg  daily, Metoprolol XL 25mg  daily Reports good compliance, took meds today. Tolerating well, w/o complaints.  Lifestyle - Screening A1c 5.0, normal - fam history of DM - Screening Thyroid panel - normal TSH Free T4  HYPERLIPIDEMIA: - Reports no concerns. Last lipid panel 06/2019, controlled  Except elevated TG 300 - Currently taking Atorvastatin 40mg , tolerating well without side effects or myalgias Lifestyle - Diet: Limited diet - Exercise: No regular exercise  Recurrent PE/DVT, on anticoagulation lifelong Reports no new problem. Continues to take Xarelto daily for anticoagulation  Additional concerns  Edema LE / Sore on L great toe - healing.  Fall on concrete, missed last step Fall R side hip, had bruise that resolved, now has R back pain History L5 separation?  Chiropractor   Health Maintenance: History of Zostavax. UTD Shingrix vaccine.  Colon CA Screening: Last Colonoscopy6/11/2010(done by Craig [name]), results reported "normal" but I do not have the report available.  UTD Flu Vaccine 04/2019   Depression screen Craig [name] 2/9 07/09/2019 01/14/2019 05/27/2018  Decreased Interest 3 3 0  Down, Depressed, Hopeless 2 2 0  PHQ - 2 Score 5 5 0  Altered sleeping 3 3 3   Tired, decreased energy 3 3 2   Change in appetite 1 1 0  Feeling bad or failure about yourself  - 0 0  Trouble concentrating 0 0 0  Moving slowly or fidgety/restless 1 1 2   Suicidal thoughts 0 0 0  PHQ-9 Score 13 13 7   Difficult doing work/chores Not difficult at all Not difficult at all Not difficult at all  Some recent data might be hidden   GAD 7 : Generalized Anxiety Score 12/27/2016  Nervous, Anxious, on Edge 0  Control/stop worrying 0  Worry too much - different things 1  Trouble relaxing 0  Restless 0  Easily annoyed or irritable 0  Afraid - awful might happen 0  Total GAD 7 Score 1  Anxiety Difficulty Not difficult at all     Past Medical History:  Diagnosis Date  . Anxiety   . Essential hypertension   . Pulmonary emboli ([place])   . 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 LEFT HEART CATHETERIZATION WITH CORONARY ANGIOGRAM Bilateral 07/27/2014   Procedure: LEFT HEART CATHETERIZATION WITH CORONARY ANGIOGRAM;  Surgeon: Lorretta Harp, MD;  Location: [name];  Service: Cardiovascular;  Laterality: Bilateral;  .  SPLENECTOMY, TOTAL     TTP   Social History   Socioeconomic History  . Marital status: Single    Spouse name: Not on file  . Number of children: Not on file  . Years of education: Not on file  . Highest education level: Not on file  Occupational History  . Occupation: Retired Water quality scientist)  Tobacco Use  . Smoking status: Former Smoker    Years: 20.00    Types: Cigarettes    Quit date: 07/24/1978     Years since quitting: 40.9  . Smokeless tobacco: Former Neurosurgeon  . Tobacco comment: Quit tobacco in 1980  Substance and Sexual Activity  . Alcohol use: Yes    Alcohol/week: 3.0 standard drinks    Types: 3 Shots of liquor per week    Comment: daily  . Drug use: No  . Sexual activity: Yes    Birth control/protection: None  Other Topics Concern  . Not on file  Social History Narrative   Retired from [employer]. Retired x 20 years.  Two children.     Social Determinants of Health   Financial Resource Strain:   . Difficulty of Paying Living Expenses: Not on file  Food Insecurity:   . Worried About Programme researcher, broadcasting/film/video in the Last Year: Not on file  . Ran Out of Food in the Last Year: Not on file  Transportation Needs:   . Lack of Transportation (Medical): Not on file  . Lack of Transportation (Non-Medical): Not on file  Physical Activity:   . Days of Exercise per Week: Not on file  . Minutes of Exercise per Session: Not on file  Stress:   . Feeling of Stress : Not on file  Social Connections:   . Frequency of Communication with Friends and Family: Not on file  . Frequency of Social Gatherings with Friends and Family: Not on file  . Attends Religious Services: Not on file  . Active Member of Clubs or Organizations: Not on file  . Attends Banker Meetings: Not on file  . Marital Status: Not on file  Intimate Partner Violence:   . Fear of Current or Ex-Partner: Not on file  . Emotionally Abused: Not on file  . Physically Abused: Not on file  . Sexually Abused: Not on file   Family History  Problem Relation Age of Onset  . CAD Father 40  . Kidney failure Mother   . CAD Brother    Current Outpatient Medications on File Prior to Visit  Medication Sig  . amLODipine (NORVASC) 5 MG tablet TAKE 1 TABLET BY MOUTH  DAILY  . atorvastatin (LIPITOR) 40 MG tablet TAKE 1 TABLET BY MOUTH  DAILY AT 6 PM.  . cholecalciferol (VITAMIN D) 1000 UNITS tablet Take 1,000  Units by mouth daily.  Marland Kitchen escitalopram (LEXAPRO) 20 MG tablet Take 1 tablet (20 mg total) by mouth daily.  Marland Kitchen losartan (COZAAR) 100 MG tablet TAKE 1 TABLET BY MOUTH  EVERY DAY FOR BLOOD  PRESSURE  . Melatonin 5 MG TABS Take 10 mg by mouth at bedtime. 3 tabs  . metoprolol succinate (TOPROL-XL) 25 MG 24 hr tablet TAKE 1 TABLET BY MOUTH  DAILY  . omeprazole (PRILOSEC) 40 MG capsule TAKE 1 CAPSULE BY MOUTH  DAILY  . sildenafil (REVATIO) 20 MG tablet Take 1-5 pills about 30 min prior to sex. Start with 1 and increase as needed.  . vitamin B-12 (CYANOCOBALAMIN) 500 MCG tablet Take 500 mcg  by mouth daily.  . vitamin E 1000 UNIT capsule Take 1,000 Units by mouth daily.  Carlena Hurl. XARELTO 20 MG TABS tablet TAKE 1 TABLET BY MOUTH  DAILY WITH SUPPER  . zolpidem (AMBIEN) 10 MG tablet Take 1 tablet (10 mg total) by mouth at bedtime as needed for sleep.  Marland Kitchen. Zoster Vaccine Adjuvanted [name](SHINGRIX) injection Inject 0.225mL intramuscular once. Then repeat dose after 2 months.   No current facility-administered medications on file prior to visit.    Review of Systems  Constitutional: Negative for activity change, appetite change, chills, diaphoresis, fatigue and fever.  HENT: Negative for congestion and hearing loss.   Eyes: Negative for visual disturbance.  Respiratory: Negative for apnea, cough, chest tightness, shortness of breath and wheezing.   Cardiovascular: Negative for chest pain, palpitations and leg swelling.  Gastrointestinal: Negative for abdominal pain, anal bleeding, blood in stool, constipation, diarrhea, nausea and vomiting.  Endocrine: Negative for cold intolerance.  Genitourinary: Negative for decreased urine volume, difficulty urinating, dysuria, frequency, hematuria and urgency.  Musculoskeletal: Negative for arthralgias, back pain and neck pain.  Skin: Negative for rash.  Allergic/Immunologic: Negative for environmental allergies.  Neurological: Negative for dizziness, weakness, light-headedness,  numbness and headaches.  Hematological: Negative for adenopathy.  Psychiatric/Behavioral: Negative for behavioral problems, dysphoric mood and sleep disturbance. The patient is not nervous/anxious.    Per HPI unless specifically indicated above     Objective:    BP 123/60   Pulse (!) 56   Temp 98.4 F (36.9 C) (Oral)   Resp 16   Ht 6\' 4"  (1.93 m)   Wt 261 lb (118.4 kg)   BMI 31.77 kg/m   Wt Readings from Last 3 Encounters:  07/08/19 261 lb (118.4 kg)  01/14/19 262 lb (118.8 kg)  05/27/18 248 lb (112.5 kg)    Physical Exam Vitals and nursing note reviewed.  Constitutional:      General: He is not in acute distress.    Appearance: He is well-developed. He is not diaphoretic.     Comments: Well-appearing, comfortable, cooperative  HENT:     Head: Normocephalic and atraumatic.  Eyes:     General:        Right eye: No discharge.        Left eye: No discharge.     Conjunctiva/sclera: Conjunctivae normal.     Pupils: Pupils are equal, round, and reactive to light.  Neck:     Thyroid: No thyromegaly.  Cardiovascular:     Rate and Rhythm: Normal rate and regular rhythm.     Heart sounds: Murmur (2/6 systolic murmur over apex w/ radiation to carotids) present.  Pulmonary:     Effort: Pulmonary effort is normal. No respiratory distress.     Breath sounds: Normal breath sounds. No wheezing or rales.  Abdominal:     General: Bowel sounds are normal. There is no distension.     Palpations: Abdomen is soft. There is no mass.     Tenderness: There is no abdominal tenderness.  Musculoskeletal:        General: No tenderness. Normal range of motion.     Cervical back: Normal range of motion and neck supple.     Comments: Upper / Lower Extremities: - Normal muscle tone, strength bilateral upper extremities 5/5, lower extremities 5/5  Lymphadenopathy:     Cervical: No cervical adenopathy.  Skin:    General: Skin is warm and dry.     Findings: No erythema or rash.  Neurological:  Mental Status: He is alert and oriented to person, place, and time.     Comments: Distal sensation intact to light touch all extremities  Psychiatric:        Behavior: Behavior normal.     Comments: Well groomed, good eye contact, normal speech and thoughts        Results for orders placed or performed in visit on 06/23/19  Hemoglobin A1c  Result Value Ref Range   Hgb A1c MFr Bld 5.0 4.8 - 5.6 %   Est. average glucose Bld gHb Est-mCnc 97 mg/dL  Physical Comprehensive metabolic panel  Result Value Ref Range   Glucose 119 (H) 65 - 99 mg/dL   BUN 8 8 - 27 mg/dL   Creatinine, Ser 4.09 0.76 - 1.27 mg/dL   GFR calc non Af Amer 83 >59 mL/min/1.73   GFR calc Af Amer 96 >59 mL/min/1.73   BUN/Creatinine Ratio 9 (L) 10 - 24   Sodium 146 (H) 134 - 144 mmol/L   Potassium 4.2 3.5 - 5.2 mmol/L   Chloride 104 96 - 106 mmol/L   CO2 24 20 - 29 mmol/L   Calcium 9.4 8.6 - 10.2 mg/dL   Total Protein 6.1 6.0 - 8.5 g/dL   Albumin 4.0 3.7 - 4.7 g/dL   Globulin, Total 2.1 1.5 - 4.5 g/dL   Albumin/Globulin Ratio 1.9 1.2 - 2.2   Bilirubin Total 0.9 0.0 - 1.2 mg/dL   Alkaline Phosphatase 107 39 - 117 IU/L   AST 83 (H) 0 - 40 IU/L   ALT 28 0 - 44 IU/L  Lipid panel  Result Value Ref Range   Cholesterol, Total 181 100 - 199 mg/dL   Triglycerides 811 (H) 0 - 149 mg/dL   HDL 914 >78 mg/dL   VLDL Cholesterol Cal 42 (H) 5 - 40 mg/dL   LDL Chol Calc (NIH) 30 0 - 99 mg/dL   Chol/HDL Ratio 1.7 0.0 - 5.0 ratio  PSA  Result Value Ref Range   Prostate Specific Ag, Serum 1.1 0.0 - 4.0 ng/mL  CBC with Differential/Platelet  Result Value Ref Range   WBC 6.2 3.4 - 10.8 x10E3/uL   RBC 3.56 (L) 4.14 - 5.80 x10E6/uL   Hemoglobin 14.3 13.0 - 17.7 g/dL   Hematocrit 29.5 62.1 - 51.0 %   MCV 114 (H) 79 - 97 fL   MCH 40.2 (H) 26.6 - 33.0 pg   MCHC 35.4 31.5 - 35.7 g/dL   RDW 30.8 65.7 - 84.6 %   Platelets 215 150 - 450 x10E3/uL   Neutrophils 47 Not Estab. %   Lymphs 40 Not Estab. %   Monocytes 12 Not  Estab. %   Eos 1 Not Estab. %   Basos 0 Not Estab. %   Neutrophils Absolute 2.9 1.4 - 7.0 x10E3/uL   Lymphocytes Absolute 2.5 0.7 - 3.1 x10E3/uL   Monocytes Absolute 0.8 0.1 - 0.9 x10E3/uL   EOS (ABSOLUTE) 0.0 0.0 - 0.4 x10E3/uL   Basophils Absolute 0.0 0.0 - 0.2 x10E3/uL   Immature Granulocytes 0 Not Estab. %   Immature Grans (Abs) 0.0 0.0 - 0.1 x10E3/uL   NRBC 1 (H) 0 - 0 %  TSH  Result Value Ref Range   TSH 0.721 0.450 - 4.500 uIU/mL      Assessment & Plan:   Problem List Items Addressed This Visit    Recurrent pulmonary emboli (HCC)    Stable, on chronic anticoagulation lifelong due to b/l DVT and recurrent PE - No new  recurrence or concerns - Continue Xarelto      Recurrent major depression in partial remission (HCC)    Improved mood on Lexapro  SSRI Secondary Insomnia Continue SSRI Lexapro , Ambien      Insomnia    Stable controlled on higher dose Ambien  nightly Chronic insomnia >60 years See prior chart Failed Zoloft Lexapro SSRI, Trazodone  Plan Continue on current therapy Ambien  nightly, already discussed risks and precautions, agrees to continue med      HLD (hyperlipidemia)    Controlled cholesterol on statin lifestyle except high TG >300 Last lipid panel 06/2019  Plan: 1. Continue current meds - ATorvastatin  2. Encourage improved lifestyle - low carb/cholesterol, reduce portion size, continue improving regular exercise      History of DVT of lower extremity    Stable, on chronic anticoagulation lifelong due to b/l DVT and recurrent PE - No new recurrence or concerns - Continue Xarelto      Essential hypertension (Chronic)    Controlled - Home BP readings normal  Complication with h/o Grade 2 diastolic dysfunction > now on repeat ECHO normal LVEF and diastolic fxn Followed by [name]   Plan:  1. Continue current BP regimen Amlodipine , Losartan , Metoprolol XL  daily 2. Encourage improved lifestyle -  low sodium diet, improve regular exercise 3. Continue monitor BP outside office, bring readings to next visit, if persistently >140/90 or new symptoms notify office sooner      Chronic anticoagulation    Stable without bleeding or complication. Continue Xarelto  daily for lifelong anticoagulation with history of b/l DVT and recurrent PE in 2016       Other Visit Diagnoses    Annual physical exam    -  Primary   Elevated AST (SGOT)       Alcohol use         Updated Health Maintenance information Reviewed recent lab results with patient Encouraged improvement to lifestyle with diet and exercise - Goal of weight loss   #Alcohol / LFT Counseling on alcohol cessation and health risks of chronic alcohol use Secondary mild elevated LFT, slight hypernatremia  No orders of the defined types were placed in this encounter.   Follow up plan: Return in about 6 months (around 01/06/2020) for 6 month follow-up HTN, HLD, Mood/Insomnia.  Saralyn Pilar, DO [facility] 07/08/2019, 2:07 PM

## 2019-07-09 NOTE — Assessment & Plan Note (Signed)
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

## 2019-07-09 NOTE — Assessment & Plan Note (Addendum)
Improved mood on Lexapro 20mg  SSRI Secondary Insomnia Continue SSRI Lexapro 20mg , Ambien

## 2019-07-09 NOTE — Assessment & Plan Note (Signed)
Controlled - Home BP readings normal  Complication with h/o Grade 2 diastolic dysfunction > now on repeat ECHO normal LVEF and diastolic fxn Followed by [facility]    Plan:  1. Continue current BP regimen Amlodipine 5mg, Losartan 100mg, Metoprolol XL 25mg daily 2. Encourage improved lifestyle - low sodium diet, improve regular exercise 3. Continue monitor BP outside office, bring readings to next visit, if persistently >140/90 or new symptoms notify office sooner 

## 2019-07-09 NOTE — Assessment & Plan Note (Signed)
Stable, on chronic anticoagulation lifelong due to b/l DVT and recurrent PE - No new recurrence or concerns - Continue Xarelto 

## 2019-07-09 NOTE — Assessment & Plan Note (Signed)
Controlled cholesterol on statin lifestyle except high TG >300 Last lipid panel 06/2019  Plan: 1. Continue current meds - ATorvastatin 40mg  2. Encourage improved lifestyle - low carb/cholesterol, reduce portion size, continue improving regular exercise

## 2019-07-09 NOTE — Assessment & Plan Note (Signed)
Stable without bleeding or complication. Continue Xarelto 20mg daily for lifelong anticoagulation with history of b/l DVT and recurrent PE in 2016 

## 2019-07-11 ENCOUNTER — Other Ambulatory Visit: Payer: Self-pay | Admitting: Family Medicine

## 2019-07-11 DIAGNOSIS — F5101 Primary insomnia: Secondary | ICD-10-CM

## 2019-07-11 NOTE — Telephone Encounter (Signed)
Controlled substance. Will need to wait for Dr. Raliegh Ip

## 2019-10-22 DIAGNOSIS — H25813 Combined forms of age-related cataract, bilateral: Secondary | ICD-10-CM | POA: Diagnosis not present

## 2020-01-16 ENCOUNTER — Other Ambulatory Visit: Payer: Self-pay | Admitting: Family Medicine

## 2020-01-16 ENCOUNTER — Ambulatory Visit: Payer: Medicare PPO | Admitting: Family Medicine

## 2020-01-16 ENCOUNTER — Other Ambulatory Visit: Payer: Self-pay

## 2020-01-16 ENCOUNTER — Encounter: Payer: Self-pay | Admitting: Family Medicine

## 2020-01-16 VITALS — BP 124/59 | HR 54 | Temp 97.5°F | Resp 16 | Ht 76.0 in | Wt 252.6 lb

## 2020-01-16 DIAGNOSIS — Z0000 Encounter for general adult medical examination without abnormal findings: Secondary | ICD-10-CM

## 2020-01-16 DIAGNOSIS — K219 Gastro-esophageal reflux disease without esophagitis: Secondary | ICD-10-CM | POA: Diagnosis not present

## 2020-01-16 DIAGNOSIS — Z7901 Long term (current) use of anticoagulants: Secondary | ICD-10-CM

## 2020-01-16 DIAGNOSIS — N401 Enlarged prostate with lower urinary tract symptoms: Secondary | ICD-10-CM

## 2020-01-16 DIAGNOSIS — F331 Major depressive disorder, recurrent, moderate: Secondary | ICD-10-CM | POA: Diagnosis not present

## 2020-01-16 DIAGNOSIS — F5101 Primary insomnia: Secondary | ICD-10-CM

## 2020-01-16 DIAGNOSIS — E785 Hyperlipidemia, unspecified: Secondary | ICD-10-CM

## 2020-01-16 DIAGNOSIS — E782 Mixed hyperlipidemia: Secondary | ICD-10-CM

## 2020-01-16 DIAGNOSIS — I10 Essential (primary) hypertension: Secondary | ICD-10-CM

## 2020-01-16 DIAGNOSIS — Z86718 Personal history of other venous thrombosis and embolism: Secondary | ICD-10-CM

## 2020-01-16 DIAGNOSIS — I2699 Other pulmonary embolism without acute cor pulmonale: Secondary | ICD-10-CM

## 2020-01-16 DIAGNOSIS — R351 Nocturia: Secondary | ICD-10-CM

## 2020-01-16 DIAGNOSIS — R7309 Other abnormal glucose: Secondary | ICD-10-CM

## 2020-01-16 MED ORDER — OMEPRAZOLE 40 MG PO CPDR: 40.0000 mg | DELAYED_RELEASE_CAPSULE | Freq: Every day | ORAL | 3 refills | Status: DC

## 2020-01-16 MED ORDER — METOPROLOL SUCCINATE ER 25 MG PO TB24: 25.0000 mg | ORAL_TABLET | Freq: Every day | ORAL | 3 refills | Status: DC

## 2020-01-16 MED ORDER — LOSARTAN POTASSIUM 100 MG PO TABS: 100.0000 mg | ORAL_TABLET | Freq: Every day | ORAL | 3 refills | Status: DC

## 2020-01-16 MED ORDER — ATORVASTATIN CALCIUM 40 MG PO TABS: 40.0000 mg | ORAL_TABLET | Freq: Every day | ORAL | 3 refills | Status: DC

## 2020-01-16 MED ORDER — ESCITALOPRAM OXALATE 20 MG PO TABS: 20.0000 mg | ORAL_TABLET | Freq: Every day | ORAL | 0 refills | Status: DC

## 2020-01-16 MED ORDER — RIVAROXABAN 20 MG PO TABS: 20.0000 mg | ORAL_TABLET | Freq: Every day | ORAL | 3 refills | Status: DC

## 2020-01-16 MED ORDER — AMLODIPINE BESYLATE 5 MG PO TABS: 5.0000 mg | ORAL_TABLET | Freq: Every day | ORAL | 3 refills | Status: DC

## 2020-01-16 MED ORDER — MIRTAZAPINE 15 MG PO TABS: 15.0000 mg | ORAL_TABLET | Freq: Every day | ORAL | 0 refills | Status: DC

## 2020-01-16 NOTE — Patient Instructions (Addendum)
Thank you for coming to the office today.  Start new medicine for sleeping insomnia and mood - Mirtazapine 15mg  nightly - try this for at least 1-2 months, to see if it can help you sleep better, may take a few weeks to take effect. - YOu can KEEP taking Escitalopram lexapro 20mg  daily, this may work better when you take both now, re ordered med  Stop Ambien In future we can consider other options such as Lunesta in future instead.  Likely burning pain in legs is nerve pinched.   DUE for FASTING BLOOD WORK (no food or drink after midnight before the lab appointment, only water or coffee without cream/sugar on the morning of)  SCHEDULE "Lab Only" visit in the morning at the clinic for lab draw in 6 MONTHS   - Make sure Lab Only appointment is at about 1 week before your next appointment, so that results will be available  For Lab Results, once available within 2-3 days of blood draw, you can can log in to MyChart online to view your results and a brief explanation. Also, we can discuss results at next follow-up visit.   Please schedule a Follow-up Appointment to: Return in about 6 months (around 07/17/2020) for Annual Physical.  If you have any other questions or concerns, please feel free to call the office or send a message through MyChart. You may also schedule an earlier appointment if necessary.  Additionally, you may be receiving a survey about your experience at our office within a few days to 1 week by e-mail or mail. We value your feedback.  , DO [facility], Saralyn Pilar

## 2020-01-16 NOTE — Assessment & Plan Note (Signed)
Stable, on chronic anticoagulation lifelong due to b/l DVT and recurrent PE - No new recurrence or concerns - Continue Xarelto 

## 2020-01-16 NOTE — Assessment & Plan Note (Addendum)
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 He declines. He does ask about CPAP and sleep study, declines to pursue.

## 2020-01-16 NOTE — Assessment & Plan Note (Signed)
Controlled - Home BP readings normal  Complication with h/o Grade 2 diastolic dysfunction > now on repeat ECHO normal LVEF and diastolic fxn Followed by [facility]    Plan:  1. Continue current BP regimen Amlodipine 5mg, Losartan 100mg, Metoprolol XL 25mg daily 2. Encourage improved lifestyle - low sodium diet, improve regular exercise 3. Continue monitor BP outside office, bring readings to next visit, if persistently >140/90 or new symptoms notify office sooner 

## 2020-01-16 NOTE — Progress Notes (Signed)
Subjective:    Patient ID: Craig [name], male    DOB: 1941/06/27, 79 y.o.   MRN: [id number]  Craig [name] is a 79 y.o. 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 He said had some weird dreams and he stopped it. 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 Patient continues to express concern about possibility of Parkinsons, he only has L isolated hand/arm tremor with intention and movement. Denies any resting tremor or other symptoms  Lower Extremity Edema Right leg burning stinging and pain at times.  FOLLOW-UP HTN He is doing well currently. Home BP readings normal. Today has normal BP. Had improved BP at dentist as well. Needs med[facility] sent to [facility] Current Meds -Amlodipine 5mg  daily, Metoprolol XL 25mg  daily Reports good compliance, took meds today. Tolerating well, w/o complaints.  HYPERLIPIDEMIA: - Reports no concerns. Last lipid panel12/2020, controlled  Except elevated TG 300 - Currently takingAtorvastatin 40mg , tolerating well without side effects or myalgias Lifestyle - Diet:Limited diet - Exercise:No regular exercise  Recurrent PE/DVT, on anticoagulation lifelong Reports no new problem. Continues to take Xarelto daily for anticoagulation   Health Maintenance: UTD COVID19 vaccine.  Depression screen [facility] 2/9 01/16/2020 07/09/2019 01/14/2019  Decreased Interest 3 3 3   Down, Depressed, Hopeless 3 2 2   PHQ - 2 Score 6 5 5     Altered sleeping 3 3 3   Tired, decreased energy 3 3 3   Change in appetite 2 1 1   Feeling bad or failure about yourself  1 - 0  Trouble concentrating 0 0 0  Moving slowly or fidgety/restless 2 1 1   Suicidal thoughts 0 0 0  PHQ-9 Score 17 13 13   Difficult doing work/chores Somewhat difficult Not difficult at all Not difficult at all  Some recent data might be hidden   GAD 7 : Generalized Anxiety Score 01/16/2020 12/27/2016  Nervous, Anxious, on Edge 3 0  Control/stop worrying 3 0  Worry too much - different things 3 1  Trouble relaxing 2 0  Restless 2 0  Easily annoyed or irritable 3 0  Afraid - awful might happen 2 0  Total GAD 7 Score 18 1  Anxiety Difficulty Somewhat difficult Not difficult at all     Social History   Tobacco Use  . Smoking status: Former Smoker    Years: 20.00    Types: Cigarettes    Quit date: 07/24/1978    Years since quitting: 41.5  . Smokeless tobacco: Former 01/16/2019  . Tobacco comment: Quit tobacco in 1980  Vaping Use  . Vaping Use: Never used  Substance Use Topics  . Alcohol use: Yes    Alcohol/week: 3.0 standard drinks    Types: 3 Shots of liquor per week    Comment: daily  . Drug use: No    Review of Systems Per HPI unless specifically indicated above     Objective:    BP (!) 124/59   Pulse (!) 54   Temp (!) 97.5 F (36.4 C) (Temporal)   Resp 16  Ht 6\' 4"  (1.93 m)   Wt 252 lb 9.6 oz (114.6 kg)   SpO2 95%   BMI 30.75 kg/m   Wt Readings from Last 3 Encounters:  01/16/20 252 lb 9.6 oz (114.6 kg)  07/08/19 261 lb (118.4 kg)  01/14/19 262 lb (118.8 kg)    Physical Exam Vitals and nursing note reviewed.  Constitutional:      General: He is not in acute distress.    Appearance: He is well-developed. He is obese. He is not diaphoretic.     Comments: Well-appearing, comfortable, cooperative  HENT:     Head: Normocephalic and atraumatic.  Eyes:     General:        Right eye: No discharge.        Left eye: No discharge.      Conjunctiva/sclera: Conjunctivae normal.  Neck:     Thyroid: No thyromegaly.  Cardiovascular:     Rate and Rhythm: Normal rate and regular rhythm.     Heart sounds: Normal heart sounds. No murmur heard.   Pulmonary:     Effort: Pulmonary effort is normal. No respiratory distress.     Breath sounds: Normal breath sounds. No wheezing or rales.  Musculoskeletal:        General: Normal range of motion.     Cervical back: Normal range of motion and neck supple.     Right lower leg: Edema present.     Left lower leg: Edema present.  Lymphadenopathy:     Cervical: No cervical adenopathy.  Skin:    General: Skin is warm and dry.     Findings: No erythema or rash.  Neurological:     Mental Status: He is alert and oriented to person, place, and time.  Psychiatric:        Behavior: Behavior normal.     Comments: Well groomed, good eye contact, normal speech and thoughts    Results for orders placed or performed in visit on 06/23/19  Hemoglobin A1c  Result Value Ref Range   Hgb A1c MFr Bld 5.0 4.8 - 5.6 %   Est. average glucose Bld gHb Est-mCnc 97 mg/dL  Physical Comprehensive metabolic panel  Result Value Ref Range   Glucose 119 (H) 65 - 99 mg/dL   BUN 8 8 - 27 mg/dL   Creatinine, Ser 06/25/19 0.76 - 1.27 mg/dL   GFR calc non Af Amer 83 >59 mL/min/1.73   GFR calc Af Amer 96 >59 mL/min/1.73   BUN/Creatinine Ratio 9 (L) 10 - 24   Sodium 146 (H) 134 - 144 mmol/L   Potassium 4.2 3.5 - 5.2 mmol/L   Chloride 104 96 - 106 mmol/L   CO2 24 20 - 29 mmol/L   Calcium 9.4 8.6 - 10.2 mg/dL   Total Protein 6.1 6.0 - 8.5 g/dL   Albumin 4.0 3.7 - 4.7 g/dL   Globulin, Total 2.1 1.5 - 4.5 g/dL   Albumin/Globulin Ratio 1.9 1.2 - 2.2   Bilirubin Total 0.9 0.0 - 1.2 mg/dL   Alkaline Phosphatase 107 39 - 117 IU/L   AST 83 (H) 0 - 40 IU/L   ALT 28 0 - 44 IU/L  Lipid panel  Result Value Ref Range   Cholesterol, Total 181 100 - 199 mg/dL   Triglycerides 7.40 (H) 0 - 149 mg/dL   HDL 814 481 mg/dL    VLDL Cholesterol Cal 42 (H) 5 - 40 mg/dL   LDL Chol Calc (NIH) 30 0 - 99 mg/dL   Chol/HDL  Ratio 1.7 0.0 - 5.0 ratio  PSA  Result Value Ref Range   Prostate Specific Ag, Serum 1.1 0.0 - 4.0 ng/mL  CBC with Differential/Platelet  Result Value Ref Range   WBC 6.2 3.4 - 10.8 x10E3/uL   RBC 3.56 (L) 4.14 - 5.80 x10E6/uL   Hemoglobin 14.3 13.0 - 17.7 g/dL   Hematocrit 40.4 37.5 - 51.0 %   MCV 114 (H) 79 - 97 fL   MCH 40.2 (H) 26.6 - 33.0 pg   MCHC 35.4 31 - 35 g/dL   RDW 13.3 11.6 - 15.4 %   Platelets 215 150 - 450 x10E3/uL   Neutrophils 47 Not Estab. %   Lymphs 40 Not Estab. %   Monocytes 12 Not Estab. %   Eos 1 Not Estab. %   Basos 0 Not Estab. %   Neutrophils Absolute 2.9 1 - 7 x10E3/uL   Lymphocytes Absolute 2.5 0 - 3 x10E3/uL   Monocytes Absolute 0.8 0 - 0 x10E3/uL   EOS (ABSOLUTE) 0.0 0.0 - 0.4 x10E3/uL   Basophils Absolute 0.0 0 - 0 x10E3/uL   Immature Granulocytes 0 Not Estab. 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 He declines. He does ask about CPAP and sleep study, declines to pursue.      Relevant Medications   escitalopram (LEXAPRO) 20 MG tablet   Essential hypertension (Chronic)    Controlled - Home BP readings normal  Complication with h/o Grade 2 diastolic dysfunction > now on repeat ECHO normal LVEF and diastolic fxn Followed by [facility] Cardiology    Plan:  1. Continue current BP regimen Amlodipine 5mg , Losartan 100mg , Metoprolol XL 25mg  daily 2. Encourage improved lifestyle - low sodium diet, improve regular exercise 3. Continue monitor BP outside office, bring readings to next visit, if persistently >140/90 or new symptoms notify office sooner      Relevant Medications   rivaroxaban (XARELTO) 20 MG TABS tablet   metoprolol succinate (TOPROL-XL) 25 MG 24 hr tablet   losartan (COZAAR) 100 MG tablet   atorvastatin (LIPITOR) 40 MG tablet  amLODipine (NORVASC) 5 MG tablet    Other Visit Diagnoses    Gastroesophageal reflux disease without esophagitis       Relevant Medications   omeprazole (PRILOSEC) 40 MG capsule   Hyperlipidemia with target LDL less than 70       Relevant Medications   rivaroxaban (XARELTO) 20 MG TABS tablet   metoprolol succinate (TOPROL-XL) 25 MG 24 hr tablet   losartan (COZAAR) 100 MG tablet   atorvastatin (LIPITOR) 40 MG tablet   amLODipine (NORVASC) 5 MG tablet      Meds ordered this encounter  Medications  . rivaroxaban (XARELTO) 20 MG TABS tablet    Sig: Take 1 tablet (20 mg total) by mouth daily with supper.    Dispense:  90 tablet    Refill:  3    Requesting 1 year supply  . omeprazole (PRILOSEC) 40 MG capsule    Sig: Take 1 capsule  (40 mg total) by mouth daily.    Dispense:  90 capsule    Refill:  3  . metoprolol succinate (TOPROL-XL) 25 MG 24 hr tablet    Sig: Take 1 tablet (25 mg total) by mouth daily.    Dispense:  90 tablet    Refill:  3  . losartan (COZAAR) 100 MG tablet    Sig: Take 1 tablet (100 mg total) by mouth daily.    Dispense:  90 tablet    Refill:  3    Requesting 1 year supply  . atorvastatin (LIPITOR) 40 MG tablet    Sig: Take 1 tablet (40 mg total) by mouth daily.    Dispense:  90 tablet    Refill:  3    Requesting 1 year supply  . amLODipine (NORVASC) 5 MG tablet    Sig: Take 1 tablet (5 mg total) by mouth daily.    Dispense:  90 tablet    Refill:  3    Requesting 1 year supply  . mirtazapine (REMERON) 15 MG tablet    Sig: Take 1 tablet (15 mg total) by mouth at bedtime.    Dispense:  90 tablet    Refill:  0  . escitalopram (LEXAPRO) 20 MG tablet    Sig: Take 1 tablet (20 mg total) by mouth daily.    Dispense:  90 tablet    Refill:  0     Follow up plan: Return in about 6 months (around 07/17/2020) for Annual Physical.  Future labs ordered for 07/12/20 [facility]  Saralyn Pilar, DO [facility] 01/16/2020, 11:14 AM

## 2020-01-16 NOTE — Progress Notes (Signed)
Changed orders to [facility]  Saralyn Pilar, DO [facility] 01/16/2020, 5:50 PM

## 2020-01-16 NOTE — Assessment & Plan Note (Signed)
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 Will add mirtazapine for mood and sleep He declines specialist psych or sleep

## 2020-01-20 ENCOUNTER — Ambulatory Visit: Payer: Medicare Other | Admitting: Family Medicine

## 2020-03-22 DIAGNOSIS — F331 Major depressive disorder, recurrent, moderate: Secondary | ICD-10-CM

## 2020-03-23 MED ORDER — MIRTAZAPINE 15 MG PO TABS: 15.0000 mg | ORAL_TABLET | Freq: Every day | ORAL | 1 refills | Status: DC

## 2020-03-31 ENCOUNTER — Other Ambulatory Visit: Payer: Self-pay | Admitting: Family Medicine

## 2020-03-31 DIAGNOSIS — F331 Major depressive disorder, recurrent, moderate: Secondary | ICD-10-CM

## 2020-04-04 ENCOUNTER — Other Ambulatory Visit: Payer: Self-pay | Admitting: Family Medicine

## 2020-04-04 DIAGNOSIS — F331 Major depressive disorder, recurrent, moderate: Secondary | ICD-10-CM

## 2020-04-04 DIAGNOSIS — I10 Essential (primary) hypertension: Secondary | ICD-10-CM

## 2020-04-04 DIAGNOSIS — I2699 Other pulmonary embolism without acute cor pulmonale: Secondary | ICD-10-CM

## 2020-04-04 DIAGNOSIS — E785 Hyperlipidemia, unspecified: Secondary | ICD-10-CM

## 2020-04-04 DIAGNOSIS — F5101 Primary insomnia: Secondary | ICD-10-CM

## 2020-04-04 NOTE — Telephone Encounter (Signed)
Requested Prescriptions  Pending Prescriptions Disp Refills   escitalopram (LEXAPRO) 20 MG tablet [Pharmacy Med Name: ESCITALOPRAM OXALATE 20 MG Tablet] 90 tablet 1    Sig: TAKE 1 TABLET (20 MG TOTAL) BY MOUTH DAILY.     Psychiatry:  Antidepressants - SSRI Passed - 04/04/2020  4:54 AM      Passed - Completed PHQ-2 or PHQ-9 in the last 360 days.      Passed - Valid encounter within last 6 months    Recent Outpatient Visits          2 months ago Moderate recurrent major depression (HCC)   [facility] [place], Netta Neat, DO   9 months ago Annual physical exam   [facility] [place], Netta Neat, DO   1 year ago Recurrent major depression in partial remission [facility])   [facility] Althea Charon, Netta Neat, DO   1 year ago Annual physical exam   [facility] Smitty Cords, DO   2 years ago Recurrent major depression in partial remission [facility])   [facility] Althea Charon, Netta Neat, DO      Future Appointments            In 3 months Althea Charon, Netta Neat, DO [facility]            XARELTO 20 MG TABS tablet [Pharmacy Med Name: XARELTO 20 MG Tablet] 90 tablet 3    Sig: TAKE 1 TABLET BY MOUTH  DAILY WITH SUPPER     Hematology: Anticoagulants - rivaroxaban Failed - 04/04/2020  4:54 AM      Failed - ALT in normal range and within 180 days    ALT  Date Value Ref Range Status  06/24/2019 28 0 - 44 IU/L Final   SGPT (ALT)  Date Value Ref Range Status  07/25/2014 16 U/L Final    Comment:    14-63 NOTE: New Reference Range 02/10/14          Failed - AST in normal range and within 180 days    AST  Date Value Ref Range Status  06/24/2019 83 (H) 0 - 40 IU/L Final   SGOT(AST)  Date Value Ref Range Status  07/25/2014 29 15 - 37 Unit/L Final         Passed - Cr in normal range and within 360 days    Creat  Date Value Ref Range Status  05/11/2016 0.84 0.70  - 1.18 mg/dL Final    Comment:      For patients > or = 78 years of age: The upper reference limit for Creatinine is approximately 13% higher for people identified as African-American.      Creatinine, Ser  Date Value Ref Range Status  06/24/2019 0.88 0.76 - 1.27 mg/dL Final         Passed - HCT in normal range and within 360 days    Hematocrit  Date Value Ref Range Status  06/24/2019 40.4 37.5 - 51.0 % Final         Passed - HGB in normal range and within 360 days    Hemoglobin  Date Value Ref Range Status  06/24/2019 14.3 13.0 - 17.7 g/dL Final         Passed - PLT in normal range and within 360 days    Platelets  Date Value Ref Range Status  06/24/2019 215 150 - 450 x10E3/uL Final  Passed - Valid encounter within last 12 months    Recent Outpatient Visits          2 months ago Moderate recurrent major depression (HCC)   [facility] [place], Netta Neat, DO   9 months ago Annual physical exam   [facility], Netta Neat, DO   1 year ago Recurrent major depression in partial remission [facility])   [facility], Netta Neat, DO   1 year ago Annual physical exam   [facility] Smitty Cords, DO   2 years ago Recurrent major depression in partial remission [facility])   [facility], Netta Neat, DO      Future Appointments            In 3 months Althea Charon, Netta Neat, DO [facility]            amLODipine (NORVASC) 5 MG tablet [Pharmacy Med Name: AMLODIPINE BESYLATE 5 MG Tablet] 90 tablet 3    Sig: TAKE 1 TABLET BY MOUTH  DAILY     Cardiovascular:  Calcium Channel Blockers Passed - 04/04/2020  4:54 AM      Passed - Last BP in normal range    BP Readings from Last 1 Encounters:  01/16/20 (!) 124/59         Passed - Valid encounter within last 6 months    Recent Outpatient Visits          2 months ago  Moderate recurrent major depression (HCC)   [facility] [place], Netta Neat, DO   9 months ago Annual physical exam   [facility] [place], Netta Neat, DO   1 year ago Recurrent major depression in partial remission [facility])   [facility] Althea Charon, Netta Neat, DO   1 year ago Annual physical exam   [facility] Smitty Cords, DO   2 years ago Recurrent major depression in partial remission [facility])   [facility] Althea Charon, Netta Neat, DO      Future Appointments            In 3 months Althea Charon, Netta Neat, DO [facility]            atorvastatin (LIPITOR) 40 MG tablet [Pharmacy Med Name: ATORVASTATIN CALCIUM 40 MG Tablet] 90 tablet 3    Sig: TAKE 1 TABLET BY MOUTH  DAILY AT 6 PM.     Cardiovascular:  Antilipid - Statins Failed - 04/04/2020  4:54 AM      Failed - LDL in normal range and within 360 days    LDL Chol Calc (NIH)  Date Value Ref Range Status  06/24/2019 30 0 - 99 mg/dL Final         Failed - Triglycerides in normal range and within 360 days    Triglycerides  Date Value Ref Range Status  06/24/2019 300 (H) 0 - 149 mg/dL Final         Passed - Total Cholesterol in normal range and within 360 days    Cholesterol, Total  Date Value Ref Range Status  06/24/2019 181 100 - 199 mg/dL Final         Passed - HDL in normal range and within 360 days    HDL  Date Value Ref Range Status  06/24/2019 109 >39 mg/dL Final         Passed -  Patient is not pregnant      Passed - Valid encounter within last 12 months    Recent Outpatient Visits          2 months ago Moderate recurrent major depression (HCC)   [facility] Smitty Cords, DO   9 months ago Annual physical exam   [facility] [place], Netta Neat, DO   1 year ago Recurrent major depression in partial remission [facility])   [facility] Althea Charon, Netta Neat, DO   1 year ago Annual physical exam   [facility] Smitty Cords, DO   2 years ago Recurrent major depression in partial remission [facility])   [facility] Althea Charon, Netta Neat, DO      Future Appointments            In 3 months Althea Charon, Netta Neat, DO [facility]            losartan (COZAAR) 100 MG tablet [Pharmacy Med Name: LOSARTAN POTASSIUM 100 MG Tablet] 90 tablet 3    Sig: TAKE 1 TABLET BY MOUTH  DAILY FOR BLOOD PRESSURE     Cardiovascular:  Angiotensin Receptor Blockers Failed - 04/04/2020  4:54 AM      Failed - Cr in normal range and within 180 days    Creat  Date Value Ref Range Status  05/11/2016 0.84 0.70 - 1.18 mg/dL Final    Comment:      For patients > or = 78 years of age: The upper reference limit for Creatinine is approximately 13% higher for people identified as African-American.      Creatinine, Ser  Date Value Ref Range Status  06/24/2019 0.88 0.76 - 1.27 mg/dL Final         Failed - K in normal range and within 180 days    Potassium  Date Value Ref Range Status  06/24/2019 4.2 3.5 - 5.2 mmol/L Final  07/25/2014 3.4 (L) 3.5 - 5.1 mmol/L Final         Passed - Patient is not pregnant      Passed - Last BP in normal range    BP Readings from Last 1 Encounters:  01/16/20 (!) 124/59         Passed - Valid encounter within last 6 months    Recent Outpatient Visits          2 months ago Moderate recurrent major depression (HCC)   [facility] Smitty Cords, DO   9 months ago Annual physical exam   [facility] Smitty C[facility], DO   1 year ago Recurrent major depression in partial remission [facility])   [facility] Althe[facility] Char[facility], Netta Neat, DO   1 year ago Annual physical exam   [facility] Smitty Cords, DO   2 years ago Recurrent major depression in  partial remission [facility])   [facility] Althea Charon, Netta Neat, DO      Future Appointments            In 3 months Althea Charon, Netta Neat, DO [facility]

## 2020-07-05 ENCOUNTER — Other Ambulatory Visit: Payer: Self-pay | Admitting: Family Medicine

## 2020-07-05 DIAGNOSIS — F5101 Primary insomnia: Secondary | ICD-10-CM

## 2020-07-05 DIAGNOSIS — F331 Major depressive disorder, recurrent, moderate: Secondary | ICD-10-CM

## 2020-07-09 ENCOUNTER — Telehealth: Payer: Self-pay | Admitting: Family Medicine

## 2020-07-09 DIAGNOSIS — E782 Mixed hyperlipidemia: Secondary | ICD-10-CM

## 2020-07-09 DIAGNOSIS — I10 Essential (primary) hypertension: Secondary | ICD-10-CM

## 2020-07-09 DIAGNOSIS — F331 Major depressive disorder, recurrent, moderate: Secondary | ICD-10-CM

## 2020-07-09 DIAGNOSIS — F5101 Primary insomnia: Secondary | ICD-10-CM

## 2020-07-09 DIAGNOSIS — R351 Nocturia: Secondary | ICD-10-CM

## 2020-07-09 DIAGNOSIS — I2699 Other pulmonary embolism without acute cor pulmonale: Secondary | ICD-10-CM

## 2020-07-09 DIAGNOSIS — Z0000 Encounter for general adult medical examination without abnormal findings: Secondary | ICD-10-CM

## 2020-07-09 DIAGNOSIS — N401 Enlarged prostate with lower urinary tract symptoms: Secondary | ICD-10-CM

## 2020-07-09 DIAGNOSIS — R7309 Other abnormal glucose: Secondary | ICD-10-CM

## 2020-07-09 NOTE — Telephone Encounter (Signed)
Signed orders change to [facility]

## 2020-07-12 ENCOUNTER — Other Ambulatory Visit: Payer: Medicare PPO

## 2020-07-12 ENCOUNTER — Other Ambulatory Visit: Payer: Self-pay

## 2020-07-12 DIAGNOSIS — I2699 Other pulmonary embolism without acute cor pulmonale: Secondary | ICD-10-CM

## 2020-07-12 DIAGNOSIS — I10 Essential (primary) hypertension: Secondary | ICD-10-CM | POA: Diagnosis not present

## 2020-07-12 DIAGNOSIS — F331 Major depressive disorder, recurrent, moderate: Secondary | ICD-10-CM

## 2020-07-12 DIAGNOSIS — F5101 Primary insomnia: Secondary | ICD-10-CM

## 2020-07-12 DIAGNOSIS — R351 Nocturia: Secondary | ICD-10-CM

## 2020-07-12 DIAGNOSIS — N401 Enlarged prostate with lower urinary tract symptoms: Secondary | ICD-10-CM | POA: Diagnosis not present

## 2020-07-12 DIAGNOSIS — Z0000 Encounter for general adult medical examination without abnormal findings: Secondary | ICD-10-CM

## 2020-07-12 DIAGNOSIS — E782 Mixed hyperlipidemia: Secondary | ICD-10-CM | POA: Diagnosis not present

## 2020-07-12 DIAGNOSIS — R7309 Other abnormal glucose: Secondary | ICD-10-CM | POA: Diagnosis not present

## 2020-07-13 LAB — LIPID PANEL
Cholesterol: 142 mg/dL (ref <200)
HDL: 67 mg/dL (ref >=40)
LDL Cholesterol (Calc): 54 mg/dL (calc)
Non-HDL Cholesterol (Calc): 75 mg/dL (calc) (ref <130)
Total CHOL/HDL Ratio: 2.1 (calc) (ref <5.0)
Triglycerides: 129 mg/dL (ref <150)

## 2020-07-13 LAB — COMPLETE METABOLIC PANEL WITH GFR
AG Ratio: 1.2 (calc) (ref 1.0–2.5)
ALT: 11 U/L (ref 9–46)
AST: 29 U/L (ref 10–35)
Albumin: 3.5 g/dL — ABNORMAL LOW (ref 3.6–5.1)
Alkaline phosphatase (APISO): 65 U/L (ref 35–144)
BUN: 10 mg/dL (ref 7–25)
CO2: 30 mmol/L (ref 20–32)
Calcium: 9.2 mg/dL (ref 8.6–10.3)
Chloride: 104 mmol/L (ref 98–110)
Creat: 0.84 mg/dL (ref 0.70–1.18)
GFR, Est African American: 97 mL/min/{1.73_m2} (ref >=60)
GFR, Est Non African American: 84 mL/min/{1.73_m2} (ref >=60)
Globulin: 2.9 g/dL (calc) (ref 1.9–3.7)
Glucose, Bld: 120 mg/dL — ABNORMAL HIGH (ref 65–99)
Potassium: 3.9 mmol/L (ref 3.5–5.3)
Sodium: 144 mmol/L (ref 135–146)
Total Bilirubin: 0.5 mg/dL (ref 0.2–1.2)
Total Protein: 6.4 g/dL (ref 6.1–8.1)

## 2020-07-13 LAB — CBC WITH DIFFERENTIAL/PLATELET
Absolute Monocytes: 576 cells/uL (ref 200–950)
Basophils Absolute: 40 cells/uL (ref 0–200)
Basophils Relative: 0.6 %
Eosinophils Absolute: 101 cells/uL (ref 15–500)
Eosinophils Relative: 1.5 %
HCT: 42.8 % (ref 38.5–50.0)
Hemoglobin: 14.8 g/dL (ref 13.2–17.1)
Lymphs Abs: 3022 cells/uL (ref 850–3900)
MCH: 38.7 pg — ABNORMAL HIGH (ref 27.0–33.0)
MCHC: 34.6 g/dL (ref 32.0–36.0)
MCV: 112 fL — ABNORMAL HIGH (ref 80.0–100.0)
MPV: 11.5 fL (ref 7.5–12.5)
Monocytes Relative: 8.6 %
Neutro Abs: 2961 cells/uL (ref 1500–7800)
Neutrophils Relative %: 44.2 %
Platelets: 292 10*3/uL (ref 140–400)
RBC: 3.82 10*6/uL — ABNORMAL LOW (ref 4.20–5.80)
RDW: 13 % (ref 11.0–15.0)
Total Lymphocyte: 45.1 %
WBC: 6.7 10*3/uL (ref 3.8–10.8)

## 2020-07-13 LAB — HEMOGLOBIN A1C
Hgb A1c MFr Bld: 5.4 % of total Hgb (ref <5.7)
Mean Plasma Glucose: 108 mg/dL
eAG (mmol/L): 6 mmol/L

## 2020-07-13 LAB — PSA: PSA: 1.15 ng/mL (ref <=4.0)

## 2020-07-19 ENCOUNTER — Encounter: Payer: Self-pay | Admitting: Family Medicine

## 2020-07-19 ENCOUNTER — Ambulatory Visit (INDEPENDENT_AMBULATORY_CARE_PROVIDER_SITE_OTHER): Payer: Medicare PPO | Admitting: Family Medicine

## 2020-07-19 ENCOUNTER — Other Ambulatory Visit: Payer: Self-pay

## 2020-07-19 VITALS — BP 127/78 | HR 66 | Temp 97.5°F | Resp 16 | Ht 76.0 in | Wt 282.6 lb

## 2020-07-19 DIAGNOSIS — F331 Major depressive disorder, recurrent, moderate: Secondary | ICD-10-CM

## 2020-07-19 DIAGNOSIS — R600 Localized edema: Secondary | ICD-10-CM

## 2020-07-19 DIAGNOSIS — N401 Enlarged prostate with lower urinary tract symptoms: Secondary | ICD-10-CM

## 2020-07-19 DIAGNOSIS — I10 Essential (primary) hypertension: Secondary | ICD-10-CM | POA: Diagnosis not present

## 2020-07-19 DIAGNOSIS — R351 Nocturia: Secondary | ICD-10-CM

## 2020-07-19 DIAGNOSIS — Z7901 Long term (current) use of anticoagulants: Secondary | ICD-10-CM

## 2020-07-19 DIAGNOSIS — Z0000 Encounter for general adult medical examination without abnormal findings: Secondary | ICD-10-CM

## 2020-07-19 DIAGNOSIS — F5101 Primary insomnia: Secondary | ICD-10-CM

## 2020-07-19 DIAGNOSIS — E782 Mixed hyperlipidemia: Secondary | ICD-10-CM

## 2020-07-19 DIAGNOSIS — I2699 Other pulmonary embolism without acute cor pulmonale: Secondary | ICD-10-CM

## 2020-07-19 NOTE — Assessment & Plan Note (Signed)
Controlled cholesterol on statin lifestyle Last lipid panel 06/2020  Plan: 1. Continue current meds - ATorvastatin 40mg  2. Encourage improved lifestyle - low carb/cholesterol, reduce portion size, continue improving regular exercise

## 2020-07-19 NOTE — Assessment & Plan Note (Signed)
Stable without bleeding or complication. Continue Xarelto 20mg  daily for lifelong anticoagulation with history of b/l DVT and recurrent PE in 2016

## 2020-07-19 NOTE — Assessment & Plan Note (Signed)
Recurrent depression, moderate, chronic - some improvement Secondary insomnia vs primary insomnia  Continue Lexapro 20mg  daily Improved on Mirtazapine for sleep as well He declines specialist psych or sleep

## 2020-07-19 NOTE — Patient Instructions (Addendum)
Thank you for coming to the office today.  Goal to improve exercise regimen to help weight and reduce swelling  Use RICE therapy: - R - Rest / relative rest with activity modification avoid overuse of joint - I - Ice packs (make sure you use a towel or sock / something to protect skin) - C - Compression with stockings or ACE wrap to apply pressure and reduce swelling allowing more support - E - Elevation - if significant swelling, lift leg above heart level (toes above your nose) to help reduce swelling, most helpful at night after day of being on your feet   1. Chemistry - Mostly Normal results, including electrolytes, kidney and liver function. - Slightly elevated fasting blood sugar   2. Hemoglobin A1c (Diabetes screening) - 5.4, normal not in range of Pre-Diabetes (>5.7 to 6.4)   3. PSA Prostate Cancer Screening - 1.15, negative.  4. CBC Blood Counts - Mostly Normal, no anemia, no other significant abnormality  5. Cholesterol - Controlled Cholesterol on Atorvastatin   Please schedule a Follow-up Appointment to: Return in about 6 months (around 01/17/2021) for 6 month follow-up Mood PHQ, Sleep, Weight, Swelling.  If you have any other questions or concerns, please feel free to call the office or send a message through MyChart. You may also schedule an earlier appointment if necessary.  Additionally, you may be receiving a survey about your experience at our office within a few days to 1 week by e-mail or mail. We value your feedback.  Saralyn Pilar, DO [facility] [place]

## 2020-07-19 NOTE — Assessment & Plan Note (Signed)
Stable BPH Checking PSA routinely, negative

## 2020-07-19 NOTE — Assessment & Plan Note (Signed)
Controlled - Home BP readings normal  Complication with h/o Grade 2 diastolic dysfunction > now on repeat ECHO normal LVEF and diastolic fxn Followed by [facility]    Plan:  1. Continue current BP regimen Amlodipine 5mg , Losartan 100mg , Metoprolol XL 25mg  daily 2. Encourage improved lifestyle - low sodium diet, improve regular exercise 3. Continue monitor BP outside office, bring readings to next visit, if persistently >140/90 or new symptoms notify office sooner

## 2020-07-19 NOTE — Assessment & Plan Note (Signed)
Chronic problem. L>R, edema, likely some lymphedema component Has not seen Vascular. Previous Cards work up without obvious cause of swelling has had normal ECHO On Amlodipine low dose Likely major factor is weight gain and sedentary lifestyle Counseling on improving activity to help improve circulation in lower extremity, next option offered Vascular referral for consultation [facility] to do further work up imaging and treatment, he declines today

## 2020-07-19 NOTE — Progress Notes (Signed)
Subjective:    Patient ID: Craig [name], male    DOB: 12/19/1940, 79 y.o.   MRN: [id number]  Craig [name] is a 79 y.o. male presenting on 07/19/2020 for Annual Exam   HPI  Here for Annual Physical and Lab Review  Alcohol Consumption / Elevated AST LFT Reduced alcohol intake Last lab result 06/2020 showed normalized AST liver enzyme  FOLLOW-UPInsomnia, Chronic /Recurrent Major Depression in Partial Remission See prior report He is doing well on current medicatoins Taking Zolpidem 10mg  daily - See PHQ - Failed past meds Trazodone, Zoloft SSRI Denies suicidal or homicidal ideation,  FOLLOW-UP HTN Home BP readings normal. Today has normal BP. Current Meds -Amlodipine 5mg  daily, Metoprolol XL 25mg  daily Reports good compliance, took meds today. Tolerating well, w/o complaints.  Lifestyle - Screening A1c 5.4, normal - fam history of DM - Screening Thyroid panel - normal TSH Free T4  HYPERLIPIDEMIA: - Reports no concerns. Last lipid panel12/2021, controlled,. Improved TG - Currently takingAtorvastatin 40mg , tolerating well without side effects or myalgias Lifestyle - Diet:Limited diet - Exercise:No regular exercise  Recurrent PE/DVT, on anticoagulation lifelong Reports no new problem. Continues to take Xarelto daily for anticoagulation  Additional concerns  Edema LE vs Lymphedema He has chronic history of lower extremity swelling, L>R, he has done some conservative measures with elevation but still has episodic worse swelling. He admits very sedentary past several months in winter, less active, with covid precautions as well. Offered refer to vascular specialist, L>R he is not ready to see specialist.   Health Maintenance: History of Zostavax. UTD Shingrix vaccine.  Colon CA Screening: [facility] results reported "normal" but I do not have the report available.  UTD Flu Vaccine 04/2019  [facility]  [facility] 07/19/2020 01/16/2020 07/09/2019  Decreased Interest 2 3 3   Down, Depressed, Hopeless 2 3 2   PHQ - 2 Score 4 6 5   Altered sleeping 3 3 3   Tired, decreased energy 2 3 3   Change in appetite 1 2 1   Feeling bad or failure about yourself  1 1 -  Trouble concentrating 1 0 0  Moving slowly or fidgety/restless 1 2 1   Suicidal thoughts 0 0 0  PHQ-9 Score 13 17 13   Difficult doing work/chores Somewhat difficult Somewhat difficult Not difficult at all  Some recent data might be hidden   GAD 7 : Generalized Anxiety Score 01/16/2020 12/27/2016  Nervous, Anxious, on Edge 3 0  Control/stop worrying 3 0  Worry too much - different things 3 1  Trouble relaxing 2 0  Restless 2 0  Easily annoyed or irritable 3 0  Afraid - awful might happen 2 0  Total GAD 7 Score 18 1  Anxiety Difficulty Somewhat difficult Not difficult at all      Past Medical History:  Diagnosis Date  . Anxiety   . Essential hypertension   . Pulmonary emboli (HCC)   . 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Past Surgical History:  Procedure Laterality Date  . LEFT HEART CATHETERIZATION WITH CORONARY ANGIOGRAM Bilateral 07/27/2014   Procedure: LEFT HEART CATHETERIZATION WITH CORONARY ANGIOGRAM;  Surgeon: Runell GessJonathan J Berry, MD;  [facility];  Service: Cardiovascular;  Laterality: Bilateral;  . SPLENECTOMY, TOTAL     TTP   Social History   Socioeconomic History  . Marital status: Single    Spouse name: Not on file  . Number of children: Not on file  . Years of education: Not on file  . Highest  education level: Not on file  Occupational History  . Occupation: Retired Water quality scientist)  Tobacco Use  . Smoking status: Former Smoker    Years: 20.00    Types: Cigarettes    Quit date: 07/24/1978    Years since quitting: 42.0  . Smokeless tobacco: Former Neurosurgeon  . Tobacco comment: Quit tobacco in 1980  Vaping Use  . Vaping Use: Never used  Substance and Sexual Activity  . Alcohol use: Yes    Alcohol/week: 3.0  standard drinks    Types: 3 Shots of liquor per week    Comment: daily  . Drug use: No  . Sexual activity: Yes    Birth control/protection: None  Other Topics Concern  . Not on file  Social History Narrative   Retired from [employer]. Retired x 20 years.  Two children.     Social Determinants of Health   Financial Resource Strain: Not on file  Food Insecurity: Not on file  Transportation Needs: Not on file  Physical Activity: Not on file  Stress: Not on file  Social Connections: Not on file  Intimate Partner Violence: Not on file   Family History  Problem Relation Age of Onset  . CAD Father 55  . Kidney failure Mother   . CAD Brother    Current Outpatient Medications on File Prior to Visit  Medication Sig  . amLODipine (NORVASC) 5 MG tablet Take 1 tablet (5 mg total) by mouth daily.  Marland Kitchen atorvastatin (LIPITOR) 40 MG tablet Take 1 tablet (40 mg total) by mouth daily.  . cholecalciferol (VITAMIN D) 1000 UNITS tablet Take 1,000 Units by mouth daily.  Marland Kitchen escitalopram (LEXAPRO) 20 MG tablet TAKE 1 TABLET EVERY DAY  . losartan (COZAAR) 100 MG tablet Take 1 tablet (100 mg total) by mouth daily.  . Melatonin 5 MG TABS Take 10 mg by mouth at bedtime. 3 tabs  . metoprolol succinate (TOPROL-XL) 25 MG 24 hr tablet Take 1 tablet (25 mg total) by mouth daily.  . mirtazapine (REMERON) 15 MG tablet TAKE 1 TABLET AT BEDTIME  . omeprazole (PRILOSEC) 40 MG capsule Take 1 capsule (40 mg total) by mouth daily.  . rivaroxaban (XARELTO) 20 MG TABS tablet Take 1 tablet (20 mg total) by mouth daily with supper.  . vitamin B-12 (CYANOCOBALAMIN) 500 MCG tablet Take 500 mcg by mouth daily.  . vitamin E 1000 UNIT capsule Take 1,000 Units by mouth daily.  Marland Kitchen Zoster Vaccine Adjuvanted [facility]) injection Inject 0.21mL intramuscular once. Then repeat dose after 2 months.   No current facility-administered medications on file prior to visit.    Review of Systems  Constitutional: Negative for  activity change, appetite change, chills, diaphoresis, fatigue and fever.  HENT: Negative for congestion and hearing loss.   Eyes: Negative for visual disturbance.  Respiratory: Negative for apnea, cough, chest tightness, shortness of breath and wheezing.   Cardiovascular: Positive for leg swelling. Negative for chest pain and palpitations.  Gastrointestinal: Negative for abdominal pain, anal bleeding, blood in stool, constipation, diarrhea, nausea and vomiting.  Endocrine: Negative for cold intolerance.  Genitourinary: Negative for difficulty urinating, dysuria, frequency and hematuria.  Musculoskeletal: Negative for arthralgias, back pain and neck pain.  Skin: Negative for rash.  Allergic/Immunologic: Negative for environmental allergies.  Neurological: Negative for dizziness, weakness, light-headedness, numbness and headaches.  Hematological: Negative for adenopathy.  Psychiatric/Behavioral: Positive for sleep disturbance. Negative for agitation, behavioral problems, decreased concentration, dysphoric mood, self-injury and suicidal ideas. The patient is not  nervous/anxious.    Per HPI unless specifically indicated above      Objective:    BP 127/78   Pulse 66   Temp (!) 97.5 F (36.4 C) (Temporal)   Resp 16   Ht 6\' 4"  (1.93 m)   Wt 282 lb 9.6 oz (128.2 kg)   SpO2 96%   BMI 34.40 kg/m   Wt Readings from Last 3 Encounters:  07/19/20 282 lb 9.6 oz (128.2 kg)  01/16/20 252 lb 9.6 oz (114.6 kg)  07/08/19 261 lb (118.4 kg)    Physical Exam Vitals and nursing note reviewed.  Constitutional:      General: He is not in acute distress.    Appearance: He is well-developed and well-nourished. He is not diaphoretic.     Comments: Well-appearing, comfortable, cooperative  HENT:     Head: Normocephalic and atraumatic.     Mouth/Throat:     Mouth: Oropharynx is clear and moist.  Eyes:     General:        Right eye: No discharge.        Left eye: No discharge.     Extraocular  Movements: EOM normal.     Conjunctiva/sclera: Conjunctivae normal.     Pupils: Pupils are equal, round, and reactive to light.  Neck:     Thyroid: No thyromegaly.  Cardiovascular:     Rate and Rhythm: Normal rate and regular rhythm.     Pulses: Intact distal pulses.     Heart sounds: Normal heart sounds. No murmur heard.   Pulmonary:     Effort: Pulmonary effort is normal. No respiratory distress.     Breath sounds: Normal breath sounds. No wheezing or rales.  Abdominal:     General: Bowel sounds are normal. There is no distension.     Palpations: Abdomen is soft. There is no mass.     Tenderness: There is no abdominal tenderness.  Musculoskeletal:        General: No tenderness. Normal range of motion.     Cervical back: Normal range of motion and neck supple.     Right lower leg: Edema present.     Left lower leg: Edema (Significantly inc compared to R, stable chronic issue pitting +2, no erythema or ulceration) present.     Comments: Upper / Lower Extremities: - Normal muscle tone, strength bilateral upper extremities 5/5, lower extremities 5/5  Lymphadenopathy:     Cervical: No cervical adenopathy.  Skin:    General: Skin is warm and dry.     Findings: No erythema or rash.  Neurological:     Mental Status: He is alert and oriented to person, place, and time.     Comments: Distal sensation intact to light touch all extremities  Psychiatric:        Mood and Affect: Mood and affect normal.        Behavior: Behavior normal.     Comments: Well groomed, good eye contact, normal speech and thoughts       Results for orders placed or performed in visit on 07/12/20  Lipid panel  Result Value Ref Range   Cholesterol 142 <200 mg/dL   HDL 67 > OR = 40 mg/dL   Triglycerides 07/14/20 409 mg/dL   LDL Cholesterol (Calc) 54 mg/dL (calc)   Total CHOL/HDL Ratio 2.1 <5.0 (calc)   Non-HDL Cholesterol (Calc) 75 <811 mg/dL (calc)  CBC with Differential/Platelet  Result Value Ref Range    WBC 6.7 3.8 - 10.8 Thousand/uL  RBC 3.82 (L) 4.20 - 5.80 Million/uL   Hemoglobin 14.8 13.2 - 17.1 g/dL   HCT 91.4 78.2 - 95.6 %   MCV 112.0 (H) 80.0 - 100.0 fL   MCH 38.7 (H) 27.0 - 33.0 pg   MCHC 34.6 32.0 - 36.0 g/dL   RDW 21.3 08.6 - 57.8 %   Platelets 292 140 - 400 Thousand/uL   MPV 11.5 7.5 - 12.5 fL   Neutro Abs 2,961 1,500 - 7,800 cells/uL   Lymphs Abs 3,022 850 - 3,900 cells/uL   Absolute Monocytes 576 200 - 950 cells/uL   Eosinophils Absolute 101 15 - 500 cells/uL   Basophils Absolute 40 0 - 200 cells/uL   Neutrophils Relative % 44.2 %   Total Lymphocyte 45.1 %   Monocytes Relative 8.6 %   Eosinophils Relative 1.5 %   Basophils Relative 0.6 %  Hemoglobin A1c  Result Value Ref Range   Hgb A1c MFr Bld 5.4 <5.7 % of total Hgb   Mean Plasma Glucose 108 mg/dL   eAG (mmol/L) 6.0 mmol/L  COMPLETE METABOLIC PANEL WITH GFR  Result Value Ref Range   Glucose, Bld 120 (H) 65 - 99 mg/dL   BUN 10 7 - 25 mg/dL   Creat 4.69 6.29 - 5.28 mg/dL   GFR, Est Non African American 84 > OR = 60 mL/min/1.12m2   GFR, Est African American 97 > OR = 60 mL/min/1.29m2   BUN/Creatinine Ratio NOT APPLICABLE 6 - 22 (calc)   Sodium 144 135 - 146 mmol/L   Potassium 3.9 3.5 - 5.3 mmol/L   Chloride 104 98 - 110 mmol/L   CO2 30 20 - 32 mmol/L   Calcium 9.2 8.6 - 10.3 mg/dL   Total Protein 6.4 6.1 - 8.1 g/dL   Albumin 3.5 (L) 3.6 - 5.1 g/dL   Globulin 2.9 1.9 - 3.7 g/dL (calc)   AG Ratio 1.2 1.0 - 2.5 (calc)   Total Bilirubin 0.5 0.2 - 1.2 mg/dL   Alkaline phosphatase (APISO) 65 35 - 144 U/L   AST 29 10 - 35 U/L   ALT 11 9 - 46 U/L  PSA  Result Value Ref Range   PSA 1.15 < OR = 4.0 ng/mL      Assessment & Plan:   Problem List Items Addressed This Visit    Recurrent pulmonary emboli (HCC)    Stable, on chronic anticoagulation lifelong due to b/l DVT and recurrent PE - No new recurrence or concerns - Continue Xarelto      Nocturia associated with benign prostatic hyperplasia    Stable  BPH Checking PSA routinely, negative      Moderate recurrent major depression (HCC)    Recurrent depression, moderate, chronic - some improvement Secondary insomnia vs primary insomnia  Continue Lexapro  daily Improved on Mirtazapine for sleep as well He declines specialist psych or sleep      Insomnia    Improved on current meds - but wt gain 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 He declines. He does ask about CPAP and sleep study, declines to pursue.      HLD (hyperlipidemia)    Controlled cholesterol on statin lifestyle Last lipid panel 06/2020  Plan: 1. Continue current  meds - ATorvastatin 40mg  2. Encourage improved lifestyle - low carb/cholesterol, reduce portion size, continue improving regular exercise      Essential hypertension (Chronic)    Controlled - Home BP readings normal  Complication with h/o Grade 2 diastolic dysfunction > now on repeat ECHO normal LVEF and diastolic fxn Followed by [facility]    Plan:  1. Continue current BP regimen Amlodipine 5mg , Losartan 100mg , Metoprolol XL 25mg  daily 2. Encourage improved lifestyle - low sodium diet, improve regular exercise 3. Continue monitor BP outside office, bring readings to next visit, if persistently >140/90 or new symptoms notify office sooner      Chronic anticoagulation    Stable without bleeding or complication. Continue Xarelto 20mg  daily for lifelong anticoagulation with history of b/l DVT and recurrent PE in 2016      Bilateral lower extremity edema    Chronic problem. L>R, edema, likely some lymphedema component Has not seen Vascular. Previous Cards work up without obvious cause of  swelling has had normal ECHO On Amlodipine low dose Likely major factor is weight gain and sedentary lifestyle Counseling on improving activity to help improve circulation in lower extremity, next option offered Vascular referral for consultation [facility] to do further work up imaging and treatment, he declines today       Other Visit Diagnoses    Annual physical exam    -  Primary      Updated Health Maintenance information Reviewed recent lab results with patient Encouraged improvement to lifestyle with diet and exercise - Goal of weight loss   No orders of the defined types were placed in this encounter.     Follow up plan: Return in about 6 months (around 01/17/2021) for 6 month follow-up Mood PHQ, Sleep, Weight, Swelling.  , DO [facility] 07/19/2020, 11:30 AM

## 2020-07-19 NOTE — Assessment & Plan Note (Signed)
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 He declines. He does ask about CPAP and sleep study, declines to pursue.

## 2020-07-19 NOTE — Assessment & Plan Note (Signed)
Stable, on chronic anticoagulation lifelong due to b/l DVT and recurrent PE - No new recurrence or concerns - Continue Xarelto

## 2020-11-07 ENCOUNTER — Other Ambulatory Visit: Payer: Self-pay | Admitting: Family Medicine

## 2020-11-07 DIAGNOSIS — K219 Gastro-esophageal reflux disease without esophagitis: Secondary | ICD-10-CM

## 2020-11-07 NOTE — Telephone Encounter (Signed)
Requested Prescriptions  Pending Prescriptions Disp Refills  . omeprazole (PRILOSEC) 40 MG capsule [Pharmacy Med Name: OMEPRAZOLE 40 MG Capsule Delayed Release] 90 capsule 3    Sig: TAKE 1 CAPSULE (40 MG TOTAL) BY MOUTH DAILY.     Gastroenterology: Proton Pump Inhibitors Passed - 11/07/2020 11:44 AM      Passed - Valid encounter within last 12 months    Recent Outpatient Visits          3 months ago Annual physical exam   [facility] Smitty Cords, DO   9 months ago Moderate recurrent major depression [facility])   [facility] Althea Charon, Netta Neat, DO   1 year ago Annual physical exam   [facility] Smitty Cords, DO   1 year ago Recurrent major depression in partial remission [facility])   [facility] Smitty Cords, DO   2 years ago Annual physical exam   [facility] Smitty Cords, DO      Future Appointments            In 2 months Althea Charon, Netta Neat, DO [facility], [facility]

## 2020-11-10 ENCOUNTER — Other Ambulatory Visit: Payer: Self-pay | Admitting: Family Medicine

## 2020-11-10 DIAGNOSIS — I10 Essential (primary) hypertension: Secondary | ICD-10-CM

## 2020-11-10 NOTE — Telephone Encounter (Signed)
Requested Prescriptions  Pending Prescriptions Disp Refills  . metoprolol succinate (TOPROL-XL) 25 MG 24 hr tablet [Pharmacy Med Name: METOPROLOL SUCCINATE ER 25 MG Tablet Extended Release 24 Hour] 90 tablet 0    Sig: TAKE 1 TABLET (25 MG TOTAL) BY MOUTH DAILY.     Cardiovascular:  Beta Blockers Passed - 11/10/2020  4:42 AM      Passed - Last BP in normal range    BP Readings from Last 1 Encounters:  07/19/20 127/78         Passed - Last Heart Rate in normal range    Pulse Readings from Last 1 Encounters:  07/19/20 66         Passed - Valid encounter within last 6 months    Recent Outpatient Visits          3 months ago Annual physical exam   [facility] Smitty Cords, DO   9 months ago Moderate recurrent major depression [facility])   [facility] Althea Charon, Netta Neat, DO   1 year ago Annual physical exam   [facility] Smitty Cords, DO   1 year ago Recurrent major depression in partial remission [facility])   [facility] Smitty Cords, DO   2 years ago Annual physical exam   [facility] Smitty Cords, DO      Future Appointments            In 2 months Althea Charon, Netta Neat, DO [facility], [facility]

## 2021-01-06 ENCOUNTER — Other Ambulatory Visit: Payer: Self-pay | Admitting: Family Medicine

## 2021-01-06 DIAGNOSIS — F331 Major depressive disorder, recurrent, moderate: Secondary | ICD-10-CM

## 2021-01-20 ENCOUNTER — Ambulatory Visit: Payer: Medicare PPO | Admitting: Family Medicine

## 2021-01-20 ENCOUNTER — Ambulatory Visit
Admission: RE | Admit: 2021-01-20 | Discharge: 2021-01-20 | Disposition: A | Discharge status: Home / Self Care | Payer: Medicare PPO | Source: Ambulatory Visit | Attending: Family Medicine | Admitting: Family Medicine

## 2021-01-20 ENCOUNTER — Encounter: Payer: Self-pay | Admitting: Family Medicine

## 2021-01-20 ENCOUNTER — Other Ambulatory Visit: Payer: Self-pay

## 2021-01-20 ENCOUNTER — Ambulatory Visit
Admission: RE | Admit: 2021-01-20 | Discharge: 2021-01-20 | Disposition: A | Discharge status: Home / Self Care | Payer: Medicare PPO | Attending: Family Medicine | Admitting: Family Medicine

## 2021-01-20 ENCOUNTER — Other Ambulatory Visit: Payer: Self-pay | Admitting: Family Medicine

## 2021-01-20 VITALS — BP 135/59 | HR 66 | Ht 76.0 in | Wt 283.2 lb

## 2021-01-20 DIAGNOSIS — R059 Cough, unspecified: Secondary | ICD-10-CM | POA: Diagnosis not present

## 2021-01-20 DIAGNOSIS — H6983 Other specified disorders of Eustachian tube, bilateral: Secondary | ICD-10-CM

## 2021-01-20 DIAGNOSIS — R0602 Shortness of breath: Secondary | ICD-10-CM | POA: Diagnosis not present

## 2021-01-20 DIAGNOSIS — Z0000 Encounter for general adult medical examination without abnormal findings: Secondary | ICD-10-CM

## 2021-01-20 DIAGNOSIS — Z7901 Long term (current) use of anticoagulants: Secondary | ICD-10-CM

## 2021-01-20 DIAGNOSIS — I890 Lymphedema, not elsewhere classified: Secondary | ICD-10-CM | POA: Diagnosis not present

## 2021-01-20 DIAGNOSIS — E782 Mixed hyperlipidemia: Secondary | ICD-10-CM

## 2021-01-20 DIAGNOSIS — R7309 Other abnormal glucose: Secondary | ICD-10-CM

## 2021-01-20 DIAGNOSIS — J3089 Other allergic rhinitis: Secondary | ICD-10-CM

## 2021-01-20 DIAGNOSIS — R053 Chronic cough: Secondary | ICD-10-CM | POA: Diagnosis not present

## 2021-01-20 DIAGNOSIS — N401 Enlarged prostate with lower urinary tract symptoms: Secondary | ICD-10-CM

## 2021-01-20 DIAGNOSIS — F331 Major depressive disorder, recurrent, moderate: Secondary | ICD-10-CM

## 2021-01-20 DIAGNOSIS — R351 Nocturia: Secondary | ICD-10-CM

## 2021-01-20 DIAGNOSIS — I10 Essential (primary) hypertension: Secondary | ICD-10-CM

## 2021-01-20 MED ORDER — FUROSEMIDE 20 MG PO TABS: 20.0000 mg | ORAL_TABLET | Freq: Every day | ORAL | 2 refills | Status: DC | PRN

## 2021-01-20 MED ORDER — FLUTICASONE PROPIONATE 50 MCG/ACT NA SUSP: 2.0000 | Freq: Every day | NASAL | 3 refills | Status: DC

## 2021-01-20 NOTE — Patient Instructions (Addendum)
Thank you for coming to the office today.  Start Furosemide fluid pill 20mg  daily as needed, it can cause increased urination to allow the swelling to improve. Can take this as needed for now, can take a few days in a row.  Start nasal steroid Flonase 2 sprays in each nostril daily for 4-6 weeks, may repeat course seasonally or as needed  The ringing can be caused by the fluid. This should improve. If it does not we can refer to ENT / Audiology for hearing.  Stay tuned in future for referral.  [facility] [address] [place], [place]  Main: [phone]   -------------  Chest [phone] today stay tuned for results.    Lymphedema  Lymphedema is swelling that is caused by the abnormal collection of lymph in the tissues under the skin. Lymph is excess fluid from the tissues in your body that is removed through the lymphatic system. This system is part of your body's defense system (immune system) and includes lymph nodes and lymph vessels. The lymph vessels collect and carry the excess fluid, fats, proteins, and waste from the tissues of the body to the bloodstream. This system also works to clean and remove bacteria andwaste products from the body. Lymphedema occurs when the lymphatic system is blocked. When the lymph vessels or lymph nodes are blocked or damaged, lymph does not drain properly. This causes an abnormal buildup of lymph, which leads to swelling in the affected area. This may include the trunk area, or an arm or leg. Lymphedema cannot becured by medicines, but various methods can be used to help reduce the swelling. What are the causes? The cause of this condition depends on the type of lymphedema that you have. Primary lymphedema is caused by the absence of lymph vessels or having abnormal lymph vessels at birth. Secondary lymphedema occurs when lymph vessels are blocked or damaged. Secondary lymphedema is more common. Common  causes of lymph vessel blockage include: Skin infection, such as cellulitis. Infection by parasites (filariasis). Injury. Radiation therapy. Cancer. Formation of scar tissue. Surgery. What are the signs or symptoms? Symptoms of this condition include: Swelling of the arm or leg. A heavy or tight feeling in the arm or leg. Swelling of the feet, toes, or fingers. Shoes or rings may fit more tightly than before. Redness of the skin over the affected area. Limited movement of the affected limb. Sensitivity to touch or discomfort in the affected limb. How is this diagnosed? This condition may be diagnosed based on: Your symptoms and medical history. A physical exam. Bioimpedance spectroscopy. In this test, painless electrical currents are used to measure fluid levels in your body. Imaging tests, such as: MRI. CT scan. Duplex ultrasound. This test uses sound waves to produce images of the vessels and the blood flow on a screen. Lymphoscintigraphy. In this test, a low dose of a radioactive substance is injected to trace the flow of lymph through your lymph vessels. Lymphangiography. In this test, a contrast dye is injected into the lymph vessel to help show blockages. How is this treated?  If an underlying condition is causing the lymphedema, that condition will betreated. For example, antibiotic medicines may be used to treat an infection. Treatment for this condition will depend on the cause of your lymphedema. Treatment may include: Complete decongestive therapy (CDT). This is done by a certified lymphedema therapist to reduce fluid congestion. This therapy includes: Skin care. Compression wrapping of the  affected area. Manual lymph drainage. This is a special massage technique that promotes lymph drainage out of a limb. Specific exercises. Certain exercises can help fluid move out of the affected limb. Compression. Various methods may be used to apply pressure to the affected limb to  reduce the swelling. They include: Wearing compression stockings or sleeves on the affected limb. Wrapping the affected limb with special bandages. Surgery. This is usually done for severe cases only. For example, surgery may be done if you have trouble moving the limb or if the swelling does not get better with other treatments. Follow these instructions at home: Self-care The affected area is more likely to become injured or infected. Take these steps to help prevent infection: Keep the affected area clean and dry. Use approved creams or lotions to keep the skin moisturized. Protect your skin from cuts: Use gloves while cooking or gardening. Do not walk barefoot. If you shave the affected area, use an Neurosurgeon. Do not wear tight clothes, shoes, or jewelry. Eat a healthy diet that includes a lot of fruits and vegetables. Activity Do exercises as told by your health care provider. Do not sit with your legs crossed. When possible, keep the affected limb raised (elevated) above the level of your heart. Avoid carrying things with an arm that is affected by lymphedema. General instructions Wear compression stockings or sleeves as told by your health care provider. Note any changes in size of the affected limb. You may be instructed to take regular measurements and keep track of them. Take over-the-counter and prescription medicines only as told by your health care provider. If you were prescribed an antibiotic medicine, take or apply it as told by your health care provider. Do not stop using the antibiotic even if you start to feel better or if your condition improves. Do not use heating pads or ice packs on the affected area. Avoid having blood draws, IV insertions, or blood pressure checks on the affected limb. Keep all follow-up visits. This is important. Contact a health care provider if you: Continue to have swelling in your limb. Have fluid leaking from the skin of your swollen  limb. Have a cut that does not heal. Have redness or pain in the affected area. Develop purplish spots, rash, blisters, or sores (lesions) on your affected limb. Get help right away if you: Have new swelling in your limb that starts suddenly. Have shortness of breath or chest pain. Have a fever or chills. These symptoms may represent a serious problem that is an emergency. Do not wait to see if the symptoms will go away. Get medical help right away. Call your local emergency services (911 in the [place] Do not drive yourself to the hospital. Summary Lymphedema is swelling that is caused by the abnormal collection of lymph in the tissues under the skin. Lymph is fluid from the tissues in your body that is removed through the lymphatic system. This system collects and carries excess fluid, fats, proteins, and wastes from the tissues of the body to the bloodstream. Lymphedema causes swelling, pain, and redness in the affected area. This may include the trunk area, or an arm or leg. Treatment for this condition may depend on the cause of your lymphedema. Treatment may include treating the underlying cause, complete decongestive therapy (CDT), compression methods, or surgery. This information is not intended to replace advice given to you by your health care provider. Make sure you discuss any questions you have with your healthcare provider.  Document Revised: 05/05/2020 Document Reviewed: 05/05/2020 Elsevier Patient Education  2022 [employer]     Please schedule a Follow-up Appointment to: Return in about 6 months (around 07/22/2021) for 6 month Annual Physical ([facility] orders).  If you have any other questions or concerns, please feel free to call the office or send a message through MyChart. You may also schedule an earlier appointment if necessary.  Additionally, you may be receiving a survey about your experience at our office within a few days to 1 week by e-mail or mail. We value your  feedback.  Saralyn Pilar, DO [facility], [place]

## 2021-01-20 NOTE — Progress Notes (Signed)
Subjective:    Patient ID: Craig [name], male    DOB: Dec 07, 1940, 80 y.o.   MRN: [id number]  Craig [name] is a 80 y.o. male presenting on 01/20/2021 for Depression, Leg Swelling, and Shortness of Breath   HPI  Lymphedema /Lower Extremity Edema Chronic problem. Previous visit 6 month ago discussed same issue. He has history of prior cardiac work up including ECHO without abnormality. He has normal kidney function. He has persistent swelling in both lower extremity with pitting edema. He admits wearing some compression socks but leave big indentation in leg. He says if laying flat legs improve and swelling improves if get up and seated will have return of swelling. Some soreness and tenderness of lower extremities. Recurrent PE/DVT, on anticoagulation lifelong Reports no new problem. Continues to take Xarelto daily for anticoagulation  Chronic Dry Cough Non productive. Feels like choking, and itchy burning feeling in back of throat. He has GERD and is on Omeprazole 40mg  daily with good results for GERD He asks about Chest X-ray - Tried inhaler in past PRN. Not having wheezing or significant dyspnea.  Tinnitus Bilateral. Admits ear pressure fullness.  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 See prior report He is doing well on current medicatoins Taking Zolpidem 10mg  daily - See PHQ - Failed past meds Trazodone, Zoloft SSRI      Depression screen [facility] 2/9 01/20/2021 07/19/2020 01/16/2020  Decreased Interest 1 2 3   Down, Depressed, Hopeless 0 2 3  PHQ - 2 Score 1 4 6   Altered sleeping 1 3 3   Tired, decreased energy 1 2 3   Change in appetite 1 1 2   Feeling bad or failure about yourself  0 1 1  Trouble concentrating 0 1 0  Moving slowly or fidgety/restless - 1 2  Suicidal thoughts 0 0 0  PHQ-9 Score 4 13 17   Difficult doing work/chores Not difficult at all Somewhat difficult Somewhat difficult  Some recent data might be hidden   GAD 7 :  Generalized Anxiety Score 01/20/2021 01/16/2020 12/27/2016  Nervous, Anxious, on Edge 0 3 0  Control/stop worrying 0 3 0  Worry too much - different things 0 3 1  Trouble relaxing 0 2 0  Restless 0 2 0  Easily annoyed or irritable 1 3 0  Afraid - awful might happen 0 2 0  Total GAD 7 Score 1 18 1   Anxiety Difficulty Not difficult at all Somewhat difficult Not difficult at all     Social History   Tobacco Use   Smoking status: Former    Years: 20.00    Pack years: 0.00    Types: Cigarettes    Quit date: 07/24/1978    Years since quitting: 42.5   Smokeless tobacco: Former   Tobacco comments:    Quit tobacco in 1980  Vaping Use   Vaping Use: Never used  Substance Use Topics   Alcohol use: Yes    Alcohol/week: 3.0 standard drinks    Types: 3 Shots of liquor per week    Comment: daily   Drug use: No    Review of Systems Per HPI unless specifically indicated above     Objective:    BP (!) 135/59   Pulse 66   Ht 6\' 4"  (1.93 m)   Wt 283 lb 3.2 oz (128.5 kg)   SpO2 91%   BMI 34.47 kg/m   Wt Readings from Last 3 Encounters:  01/20/21 283 lb 3.2 oz (128.5 kg)  07/19/20 282 lb 9.6 oz (128.2 kg)  01/16/20 252 lb 9.6 oz (114.6 kg)    Physical Exam Vitals and nursing note reviewed.  Constitutional:      General: He is not in acute distress.    Appearance: He is well-developed. He is obese. He is not diaphoretic.     Comments: Well-appearing, comfortable, cooperative  HENT:     Head: Normocephalic and atraumatic.  Eyes:     General:        Right eye: No discharge.        Left eye: No discharge.     Conjunctiva/sclera: Conjunctivae normal.  Neck:     Thyroid: No thyromegaly.  Cardiovascular:     Rate and Rhythm: Normal rate and regular rhythm.     Pulses: Normal pulses.     Heart sounds: Normal heart sounds. No murmur heard. Pulmonary:     Effort: Pulmonary effort is normal. No respiratory distress.     Breath sounds: Normal breath sounds. No wheezing or rales.   Musculoskeletal:        General: Normal range of motion.     Cervical back: Normal range of motion and neck supple.     Right lower leg: Edema (2+ pitting edema bilateral lower ext. no erythema, mild soreness non focal tender.) present.     Left lower leg: Edema (+2 pitting edema) present.  Lymphadenopathy:     Cervical: No cervical adenopathy.  Skin:    General: Skin is warm and dry.     Findings: No erythema or rash.  Neurological:     Mental Status: He is alert and oriented to person, place, and time. Mental status is at baseline.  Psychiatric:        Behavior: Behavior normal.     Comments: Well groomed, good eye contact, normal speech and thoughts   Results for orders placed or performed in visit on 07/12/20  Lipid panel  Result Value Ref Range   Cholesterol 142 <200 mg/dL   HDL 67 > OR = 40 mg/dL   Triglycerides 947 <096 mg/dL   LDL Cholesterol (Calc) 54 mg/dL (calc)   Total CHOL/HDL Ratio 2.1 <5.0 (calc)   Non-HDL Cholesterol (Calc) 75 <283 mg/dL (calc)  CBC with Differential/Platelet  Result Value Ref Range   WBC 6.7 3.8 - 10.8 Thousand/uL   RBC 3.82 (L) 4.20 - 5.80 Million/uL   Hemoglobin 14.8 13.2 - 17.1 g/dL   HCT 66.2 94.7 - 65.4 %   MCV 112.0 (H) 80.0 - 100.0 fL   MCH 38.7 (H) 27.0 - 33.0 pg   MCHC 34.6 32.0 - 36.0 g/dL   RDW 65.0 35.4 - 65.6 %   Platelets 292 140 - 400 Thousand/uL   MPV 11.5 7.5 - 12.5 fL   Neutro Abs 2,961 1,500 - 7,800 cells/uL   Lymphs Abs 3,022 850 - 3,900 cells/uL   Absolute Monocytes 576 200 - 950 cells/uL   Eosinophils Absolute 101 15 - 500 cells/uL   Basophils Absolute 40 0 - 200 cells/uL   Neutrophils Relative % 44.2 %   Total Lymphocyte 45.1 %   Monocytes Relative 8.6 %   Eosinophils Relative 1.5 %   Basophils Relative 0.6 %  Hemoglobin A1c  Result Value Ref Range   Hgb A1c MFr Bld 5.4 <5.7 % of total Hgb   Mean Plasma Glucose 108 mg/dL   eAG (mmol/L) 6.0 mmol/L  COMPLETE METABOLIC PANEL WITH GFR  Result Value Ref Range    Glucose, Bld 120 (  H) 65 - 99 mg/dL   BUN 10 7 - 25 mg/dL   Creat 5.36 6.44 - 0.34 mg/dL   GFR, Est Non African American 84 > OR = 60 mL/min/1.63m2   GFR, Est African American 97 > OR = 60 mL/min/1.56m2   BUN/Creatinine Ratio NOT APPLICABLE 6 - 22 (calc)   Sodium 144 135 - 146 mmol/L   Potassium 3.9 3.5 - 5.3 mmol/L   Chloride 104 98 - 110 mmol/L   CO2 30 20 - 32 mmol/L   Calcium 9.2 8.6 - 10.3 mg/dL   Total Protein 6.4 6.1 - 8.1 g/dL   Albumin 3.5 (L) 3.6 - 5.1 g/dL   Globulin 2.9 1.9 - 3.7 g/dL (calc)   AG Ratio 1.2 1.0 - 2.5 (calc)   Total Bilirubin 0.5 0.2 - 1.2 mg/dL   Alkaline phosphatase (APISO) 65 35 - 144 U/L   AST 29 10 - 35 U/L   ALT 11 9 - 46 U/L  PSA  Result Value Ref Range   PSA 1.15 < OR = 4.0 ng/mL      Assessment & Plan:   Problem List Items Addressed This Visit     Lymphedema of both lower extremities - Primary   Relevant Medications   furosemide (LASIX) 20 MG tablet   Other Visit Diagnoses     Eustachian tube dysfunction, bilateral       Relevant Medications   fluticasone (FLONASE) 50 MCG/ACT nasal spray   Seasonal allergic rhinitis due to other allergic trigger       Relevant Medications   fluticasone (FLONASE) 50 MCG/ACT nasal spray   Other Relevant Orders   DG Chest 2 View   Cough       Relevant Orders   DG Chest 2 View      Chronic Cough Dry cough without other concerning features Suspected upper airway source. Possible GERD vs post nasal drainage / rhinitis Already on PPI 40mg  omeprazole daily Start nasal steroid Flonase 2 sprays in each nostril daily for 4-6 weeks, may repeat course seasonally or as needed Check baseline CXR today  Lymphedema, lower ext Chronic problem, has had worsening in past 6-12 months Hx of prior recurrent DVT can contribute but he has bilateral lymphedema on exam with pitting edema ECHO in past negative, kidney function normal no other abnormality in blood work that would explain History most suggestive of  lymphedema RICE therapy, compression Trial on Furosemide 20mg  daily PRN only for temporary symptom relief Emphasized vascular specialty referral as next option for management may need lymphedema pump, he will defer this for now re-eval in 6 months.  Tinnitus Can be related to eustachian tube dysfunction bilateral Trial on Flonase Can refer to ENT/Audio if indicated   Meds ordered this encounter  Medications   furosemide (LASIX) 20 MG tablet    Sig: Take 1 tablet (20 mg total) by mouth daily as needed for edema or fluid.    Dispense:  30 tablet    Refill:  2   fluticasone (FLONASE) 50 MCG/ACT nasal spray    Sig: Place 2 sprays into both nostrils daily. Use for 4-6 weeks then stop and use seasonally or as needed.    Dispense:  16 g    Refill:  3    Follow up plan: Return in about 6 months (around 07/22/2021) for 6 month fasting lab only then 1 week later Annual Physical.  Future labs ordered for [facility] in system, can be released when patient is ready   Althea CharonKaramalegos, DO [facility] 01/20/2021, 11:17 AM

## 2021-01-22 ENCOUNTER — Other Ambulatory Visit: Payer: Self-pay | Admitting: Family Medicine

## 2021-01-22 DIAGNOSIS — I10 Essential (primary) hypertension: Secondary | ICD-10-CM

## 2021-01-22 NOTE — Telephone Encounter (Signed)
Requested medications are due for refill today yes  Requested medications are on the active medication list yes  Last refill 11/11/20  Last visit 06/2020 OV note addressed DM, 12/2020 seen for unrelated reasons  Future visit scheduled 07/2020  Notes to clinic Unsure if June visit allows to fill to last until Jan 2022 visit.

## 2021-02-09 ENCOUNTER — Other Ambulatory Visit: Payer: Self-pay | Admitting: Family Medicine

## 2021-02-09 DIAGNOSIS — I10 Essential (primary) hypertension: Secondary | ICD-10-CM

## 2021-02-10 ENCOUNTER — Other Ambulatory Visit: Payer: Self-pay | Admitting: Family Medicine

## 2021-02-10 DIAGNOSIS — I2699 Other pulmonary embolism without acute cor pulmonale: Secondary | ICD-10-CM

## 2021-02-10 DIAGNOSIS — E785 Hyperlipidemia, unspecified: Secondary | ICD-10-CM

## 2021-02-10 DIAGNOSIS — I10 Essential (primary) hypertension: Secondary | ICD-10-CM

## 2021-02-10 NOTE — Telephone Encounter (Signed)
Requested medications are due for refill today.  yes  Requested medications are on the active medications list.  yes  Last refill. 01/16/2020  Future visit scheduled.   yes  Notes to clinic.  All 3 prescriptions are expired.

## 2021-02-14 ENCOUNTER — Other Ambulatory Visit: Payer: Self-pay | Admitting: Family Medicine

## 2021-02-14 DIAGNOSIS — F331 Major depressive disorder, recurrent, moderate: Secondary | ICD-10-CM

## 2021-02-14 DIAGNOSIS — F5101 Primary insomnia: Secondary | ICD-10-CM

## 2021-03-13 ENCOUNTER — Other Ambulatory Visit: Payer: Self-pay | Admitting: Family Medicine

## 2021-03-13 DIAGNOSIS — I890 Lymphedema, not elsewhere classified: Secondary | ICD-10-CM

## 2021-03-13 NOTE — Telephone Encounter (Signed)
Last RF 01/20/21 #30 2 RF

## 2021-03-31 ENCOUNTER — Encounter: Payer: Self-pay | Admitting: Family Medicine

## 2021-04-06 ENCOUNTER — Other Ambulatory Visit: Payer: Self-pay

## 2021-04-06 ENCOUNTER — Telehealth: Payer: Self-pay

## 2021-04-06 ENCOUNTER — Telehealth: Payer: Medicare PPO | Admitting: Internal Medicine

## 2021-04-06 ENCOUNTER — Emergency Department
Admission: EM | Admit: 2021-04-06 | Discharge: 2021-04-07 | Disposition: A | Discharge status: Home / Self Care | Payer: Medicare PPO | Attending: Emergency Medicine | Admitting: Emergency Medicine

## 2021-04-06 ENCOUNTER — Emergency Department: Payer: Medicare PPO

## 2021-04-06 ENCOUNTER — Encounter: Payer: Self-pay | Admitting: Family Medicine

## 2021-04-06 ENCOUNTER — Ambulatory Visit: Payer: Medicare PPO | Admitting: Internal Medicine

## 2021-04-06 DIAGNOSIS — G8929 Other chronic pain: Secondary | ICD-10-CM | POA: Insufficient documentation

## 2021-04-06 DIAGNOSIS — M545 Low back pain, unspecified: Secondary | ICD-10-CM | POA: Diagnosis not present

## 2021-04-06 DIAGNOSIS — Z20822 Contact with and (suspected) exposure to covid-19: Secondary | ICD-10-CM | POA: Insufficient documentation

## 2021-04-06 DIAGNOSIS — R053 Chronic cough: Secondary | ICD-10-CM | POA: Insufficient documentation

## 2021-04-06 DIAGNOSIS — R296 Repeated falls: Secondary | ICD-10-CM

## 2021-04-06 DIAGNOSIS — Z955 Presence of coronary angioplasty implant and graft: Secondary | ICD-10-CM | POA: Diagnosis not present

## 2021-04-06 DIAGNOSIS — R531 Weakness: Secondary | ICD-10-CM

## 2021-04-06 DIAGNOSIS — Z7901 Long term (current) use of anticoagulants: Secondary | ICD-10-CM | POA: Insufficient documentation

## 2021-04-06 DIAGNOSIS — Z87891 Personal history of nicotine dependence: Secondary | ICD-10-CM | POA: Insufficient documentation

## 2021-04-06 DIAGNOSIS — I10 Essential (primary) hypertension: Secondary | ICD-10-CM | POA: Diagnosis not present

## 2021-04-06 DIAGNOSIS — R0902 Hypoxemia: Secondary | ICD-10-CM | POA: Diagnosis not present

## 2021-04-06 DIAGNOSIS — R42 Dizziness and giddiness: Secondary | ICD-10-CM | POA: Diagnosis not present

## 2021-04-06 DIAGNOSIS — W19XXXA Unspecified fall, initial encounter: Secondary | ICD-10-CM | POA: Insufficient documentation

## 2021-04-06 DIAGNOSIS — R29818 Other symptoms and signs involving the nervous system: Secondary | ICD-10-CM | POA: Diagnosis not present

## 2021-04-06 DIAGNOSIS — R001 Bradycardia, unspecified: Secondary | ICD-10-CM | POA: Diagnosis not present

## 2021-04-06 DIAGNOSIS — Z79899 Other long term (current) drug therapy: Secondary | ICD-10-CM | POA: Insufficient documentation

## 2021-04-06 LAB — URINALYSIS, COMPLETE (UACMP) WITH MICROSCOPIC
Bacteria, UA: NONE SEEN
Bilirubin Urine: NEGATIVE
Glucose, UA: NEGATIVE mg/dL
Ketones, ur: 5 mg/dL — AB
Leukocytes,Ua: NEGATIVE
Nitrite: NEGATIVE
Protein, ur: NEGATIVE mg/dL
Specific Gravity, Urine: 1.012 (ref 1.005–1.030)
Squamous Epithelial / HPF: NONE SEEN (ref 0–5)
pH: 6 (ref 5.0–8.0)

## 2021-04-06 LAB — HEPATIC FUNCTION PANEL
ALT: 26 U/L (ref 0–44)
AST: 85 U/L — ABNORMAL HIGH (ref 15–41)
Albumin: 3.5 g/dL (ref 3.5–5.0)
Alkaline Phosphatase: 65 U/L (ref 38–126)
Bilirubin, Direct: 0.2 mg/dL (ref 0.0–0.2)
Indirect Bilirubin: 0.9 mg/dL (ref 0.3–0.9)
Total Bilirubin: 1.1 mg/dL (ref 0.3–1.2)
Total Protein: 6.9 g/dL (ref 6.5–8.1)

## 2021-04-06 LAB — BASIC METABOLIC PANEL
Anion gap: 10 (ref 5–15)
BUN: 9 mg/dL (ref 8–23)
CO2: 29 mmol/L (ref 22–32)
Calcium: 9.1 mg/dL (ref 8.9–10.3)
Chloride: 99 mmol/L (ref 98–111)
Creatinine, Ser: 0.71 mg/dL (ref 0.61–1.24)
GFR, Estimated: >60 mL/min (ref >60)
Glucose, Bld: 107 mg/dL — ABNORMAL HIGH (ref 70–99)
Potassium: 3.8 mmol/L (ref 3.5–5.1)
Sodium: 138 mmol/L (ref 135–145)

## 2021-04-06 LAB — CBC
HCT: 39.8 % (ref 39.0–52.0)
Hemoglobin: 14.2 g/dL (ref 13.0–17.0)
MCH: 38.5 pg — ABNORMAL HIGH (ref 26.0–34.0)
MCHC: 35.7 g/dL (ref 30.0–36.0)
MCV: 107.9 fL — ABNORMAL HIGH (ref 80.0–100.0)
Platelets: 258 10*3/uL (ref 150–400)
RBC: 3.69 MIL/uL — ABNORMAL LOW (ref 4.22–5.81)
RDW: 21.1 % — ABNORMAL HIGH (ref 11.5–15.5)
WBC: 10.3 10*3/uL (ref 4.0–10.5)
nRBC: 0 % (ref 0.0–0.2)

## 2021-04-06 LAB — RESP PANEL BY RT-PCR (FLU A&B, COVID) ARPGX2
Influenza A by PCR: NEGATIVE
Influenza B by PCR: NEGATIVE
SARS Coronavirus 2 by RT PCR: NEGATIVE

## 2021-04-06 LAB — TROPONIN I (HIGH SENSITIVITY): Troponin I (High Sensitivity): 14 ng/L (ref <18)

## 2021-04-06 NOTE — ED Notes (Signed)
Pt O2 saturations low at this time. This RN placed this pt on 2L [place] O2, MD Paduchowski made aware.

## 2021-04-06 NOTE — ED Notes (Signed)
RN Amy at bedside assisting pt to urinate

## 2021-04-06 NOTE — Telephone Encounter (Signed)
The pt was scheduled for a virtual mychart visit on [facility] schedule today for weakness in his legs. After triaging the patient he informed me that Sunday, September 4th he [place] with [place] son about 40 feet and his legs became extremely weak. He said he notice it was a struggle getting down to the dock and even more of a struggle walking back up to the house from the dock. He said when he got back to the house and sat down to rest his legs, he has not been able to bear weight on his legs since. Unable to stand at all on his legs because they are extremely weak. He have to get help transferring to a bedside commode. His wife said they bought a wheelchair, Holter lift to get him up and walker that he is not been able to use. They have to use the lift to get him out of the bed. The patient denies any pain or sickness, no other symptoms besides the leg weakness. Regina notified and verbalize that he need to go to the emergency room to be evaluated. She said it is not normal for you to go from ambulating to not ambulating with no know reason. The patient and his wife verbalize understanding. She said she will not be able to get him out the house by herself. I recommended that she call EMS to transport him to the hospital. She verbalize understanding, no questions or concerns.

## 2021-04-06 NOTE — ED Provider Notes (Signed)
[facility] Emergency Department Provider Note  Time seen: 3:45 PM  I have reviewed the triage vital signs and the nursing notes.   HISTORY  Chief Complaint Weakness   HPI Craig [name] is a 80 y.o. male with a past medical history anxiety, hypertension, past PE, depression, presents to the emergency department for generalized weakness.  According to the patient for the past 10 days he has been experiencing significant weakness in his bilateral lower extremities.  Denies any unilateral weakness.  Does state lower back pain but states this is chronic.  Denies any incontinence.  Patient states he just does not feel like he has a strength to get out of bed any longer.  Patient denies any fever abdominal pain, chest pain.  Denies any nausea vomiting or diarrhea.  Does state a slight cough but states this is chronic.  Patient states he has had multiple falls over the past week due to weakness.  States previously had been at least 6 months since he fell.    Past Medical History:  Diagnosis Date   Anxiety    Essential hypertension    Pulmonary emboli (HCC)    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 Authorizing Provider  amLODipine (NORVASC) 5 MG tablet TAKE 1 TABLET (5 MG TOTAL) BY MOUTH DAILY. 02/10/21   Karamalegos, Netta Neat, DO  atorvastatin (LIPITOR) 40 MG tablet TAKE 1 TABLET (40 MG TOTAL) BY MOUTH DAILY. 02/10/21   Karamalegos, Netta Neat, DO  cholecalciferol (VITAMIN D) 1000 UNITS tablet Take 1,000 Units by mouth daily.    [provider]  escitalopram (LEXAPRO) 20 MG tablet TAKE 1 TABLET EVERY DAY 02/14/21   Karamalegos, Netta Neat, DO  fluticasone (FLONASE) 50 MCG/ACT nasal spray Place 2 sprays into both nostrils daily. Use for 4-6 weeks then stop and use seasonally or as needed. 01/20/21   Karamalegos, Netta Neat, DO  furosemide (LASIX) 20 MG tablet Take 1 tablet (20 mg total) by mouth daily as needed for edema or fluid. 01/20/21   Karamalegos, Netta Neat, DO  losartan (COZAAR) 100 MG tablet TAKE 1 TABLET (100 MG TOTAL) BY MOUTH DAILY. 02/09/21   Karamalegos, Netta Neat, DO  Melatonin 5 MG TABS Take 10 mg by mouth at bedtime. 3 tabs    [provider]  metoprolol succinate (TOPROL-XL) 25 MG 24 hr tablet TAKE 1 TABLET (25 MG TOTAL) BY MOUTH DAILY. 01/26/21   Smitty Cords, DO  mirtazapine (  REMERON) 15 MG tablet TAKE 1 TABLET AT BEDTIME 01/06/21   Karamalegos, Alexander J, DO  omeprazole (PRILOSEC) 40 MG capsule TAKE 1 CAPSULE (40 MG TOTAL) BY MOUTH DAILY. 11/07/20   Karamalegos, Netta Neat, DO  vitamin B-12 (CYANOCOBALAMIN) 500 MCG tablet Take 500 mcg by mouth daily.    [provider]  vitamin E 1000 UNIT capsule Take 1,000 Units by mouth daily.    [provider]  XARELTO 20 MG TABS tablet TAKE 1 TABLET (20 MG TOTAL) BY MOUTH DAILY  WITH SUPPER. 02/10/21   Karamalegos, Netta Neat, DO  Zoster Vaccine Adjuvanted [facility]) injection Inject 0.97mL intramuscular once. Then repeat dose after 2 months. 05/27/18   Karamalegos, Netta Neat, DO    Allergies  Allergen Reactions   Trazodone And Nefazodone Itching and Swelling    Family History  Problem Relation Age of Onset   CAD Father 26   Kidney failure Mother    CAD Brother     Social History Social History   Tobacco Use   Smoking status: Former    Years: 20.00    Types: Cigarettes    Quit date: 07/24/1978    Years since quitting: 42.7   Smokeless tobacco: Former   Tobacco comments:    Quit tobacco in 1980  Vaping Use   Vaping Use: Never used  Substance Use Topics   Alcohol use: Yes    Alcohol/week: 3.0 standard drinks    Types: 3 Shots of liquor per week    Comment: daily   Drug use: No    Review of Systems Constitutional: Negative for fever.  Positive for generalized weakness. Eyes: Negative for visual complaints ENT: Negative for recent illness/congestion Cardiovascular: Negative for chest pain. Respiratory: Negative for shortness of breath.  Slight cough, chronic. Gastrointestinal: Negative for abdominal pain, vomiting and diarrhea. Genitourinary: Negative for urinary compaints Musculoskeletal: Negative for musculoskeletal complaints Skin: Negative for skin complaints  Neurological: Negative for headache.  Positive bilateral leg weakness. All other ROS negative  ____________________________________________   PHYSICAL EXAM:  VITAL SIGNS: ED Triage Vitals  Enc Vitals Group     BP 04/06/21 1233 118/65     Pulse Rate 04/06/21 1233 (!) 59     Resp 04/06/21 1232 20     Temp 04/06/21 1232 98.1 F (36.7 C)     Temp Source 04/06/21 1232 Oral     SpO2 04/06/21 1233 96 %     Weight 04/06/21 1233 270 lb (122.5 kg)     Height 04/06/21 1233 6\' 4"  (1.93 m)     Head Circumference --      Peak Flow --      Pain Score 04/06/21 1233 0     Pain Loc --       Pain Edu? --      Excl. in GC? --    Constitutional: Alert and oriented. Well appearing and in no distress. Eyes: Normal exam ENT      Head: Normocephalic and atraumatic.      Mouth/Throat: Mucous membranes are moist. Cardiovascular: Normal rate, regular rhythm.  Respiratory: Normal respiratory effort without tachypnea nor retractions. Breath sounds are clear  Gastrointestinal: Soft and nontender. No distention.   Musculoskeletal: Nontender with normal range of motion in all extremities.  Neurologic:  Normal speech and language.  Patient has 3/5 strength in bilateral lower extremities.  Sensation appears intact.  5/5 motor in bilateral upper extremities.  No pronator drift. Skin:  Skin is warm, dry and intact.  Psychiatric: Mood  and affect are normal.   ____________________________________________    EKG  EKG viewed and interpreted by myself shows sinus bradycardia at 56 bpm with a widened QRS, normal axis, normal intervals, nonspecific ST changes.  No ST elevation.  ____________________________________________    RADIOLOGY  CT scan of the head is negative for acute abnormality.  ____________________________________________   INITIAL IMPRESSION / ASSESSMENT AND PLAN / ED COURSE  Pertinent labs & imaging results that were available during my care of the patient were reviewed by me and considered in my medical decision making (see chart for details).   Patient presents to the emergency department for generalized weakness over the past 10 days.  States most of his weakness is in the bilateral lower extremities but has a sensation of generalized fatigue as well.  No fever.  No chest pain abdominal pain nausea vomiting diarrhea does state slight cough but states this is chronic.  Lab work is largely within normal limits.  CT scan of the head is negative.  I have added on a troponin as a precaution.  We will also check a urinalysis and a COVID swab.  We will continue to closely  monitor.  If the patient's work-up remains negative patient may benefit from MR imaging of the brain and possibly L-spine.  MRIs are essentially negative for acute/concerning abnormalities.  I spoke to the patient he wishes to go home given the reassuring ER work-up.  After speaking to the patient more in depth he states this has been a much longer progressive decline although worse over the past 1 week.  States he has bought multiple assistive devices at home such as a Nurse, adult, Passenger transport manager, bedside commode.  Patient does not have any help at home however so we will order a transition of care consult for the patient so hopefully they can help with physical therapy if needed I have also placed a face-to-face order for the patient.  Offered to place the patient into a rehab/nursing facility given his weakness but the patient wishes to go home.  MONROE QIN was evaluated in Emergency Department on 04/06/2021 for the symptoms described in the history of present illness. He was evaluated in the context of the global COVID-19 pandemic, which necessitated consideration that the patient might be at risk for infection with the SARS-CoV-2 virus that causes COVID-19. Institutional protocols and algorithms that pertain to the evaluation of patients at risk for COVID-19 are in a state of rapid change based on information released by regulatory bodies including the CDC and federal and state organizations. These policies and algorithms were followed during the patient's care in the ED.  ____________________________________________   FINAL CLINICAL IMPRESSION(S) / ED DIAGNOSES  Weakness Falls   Minna Antis, MD 04/06/21 2319

## 2021-04-06 NOTE — Telephone Encounter (Signed)
Thanks for your evaluation and help with this patient. This is definitely unusual for him. He has known history of notable leg swelling and lower extremity edema, venous insufficiency, among other chronic conditions but this degree of weakness and immobility does not seem typical of his past medical conditions. I agree with ED evaluation triage.  Saralyn Pilar, DO [facility] 04/06/2021, 12:22 PM

## 2021-04-06 NOTE — ED Triage Notes (Signed)
Pt to ED [facility] from home for weakness in legs for past 5 days. Multiple falls the past few days. +blood thinners Alert and oriented

## 2021-04-07 DIAGNOSIS — Z743 Need for continuous supervision: Secondary | ICD-10-CM | POA: Diagnosis not present

## 2021-04-07 DIAGNOSIS — R531 Weakness: Secondary | ICD-10-CM | POA: Diagnosis not present

## 2021-04-07 DIAGNOSIS — I959 Hypotension, unspecified: Secondary | ICD-10-CM | POA: Diagnosis not present

## 2021-04-07 NOTE — ED Notes (Signed)
Patient discharged to home per MD order. Patient in stable condition, and deemed medically cleared by ED provider for discharge. Discharge instructions reviewed with patient/family using "Teach Back"; verbalized understanding of medication education and administration, and information about follow-up care. Denies further concerns. ° °

## 2021-04-07 NOTE — ED Notes (Signed)
Pt. Waiting for EMS transport home.

## 2021-04-07 NOTE — ED Notes (Signed)
Called [facility] to transport patient back home. 

## 2021-04-11 ENCOUNTER — Other Ambulatory Visit: Payer: Self-pay | Admitting: Family Medicine

## 2021-04-11 DIAGNOSIS — I890 Lymphedema, not elsewhere classified: Secondary | ICD-10-CM

## 2021-04-18 NOTE — Progress Notes (Signed)
   CSW received request to contact Dr. Blythe Stanford to update him on patient home health status.  Patient did not have TOC consult and was discharge home from ED. Patient will contact PCP for home health orders.

## 2021-04-18 NOTE — Addendum Note (Signed)
Addended by: Smitty Cords on: 04/18/2021 01:28 PM   Modules accepted: Orders

## 2021-04-20 ENCOUNTER — Telehealth: Payer: Self-pay | Admitting: Family Medicine

## 2021-04-20 DIAGNOSIS — F3341 Major depressive disorder, recurrent, in partial remission: Secondary | ICD-10-CM | POA: Diagnosis not present

## 2021-04-20 DIAGNOSIS — G4700 Insomnia, unspecified: Secondary | ICD-10-CM | POA: Diagnosis not present

## 2021-04-20 DIAGNOSIS — J302 Other seasonal allergic rhinitis: Secondary | ICD-10-CM | POA: Diagnosis not present

## 2021-04-20 DIAGNOSIS — K219 Gastro-esophageal reflux disease without esophagitis: Secondary | ICD-10-CM | POA: Diagnosis not present

## 2021-04-20 DIAGNOSIS — D693 Immune thrombocytopenic purpura: Secondary | ICD-10-CM | POA: Diagnosis not present

## 2021-04-20 DIAGNOSIS — F419 Anxiety disorder, unspecified: Secondary | ICD-10-CM | POA: Diagnosis not present

## 2021-04-20 DIAGNOSIS — H9313 Tinnitus, bilateral: Secondary | ICD-10-CM | POA: Diagnosis not present

## 2021-04-20 DIAGNOSIS — I890 Lymphedema, not elsewhere classified: Secondary | ICD-10-CM | POA: Diagnosis not present

## 2021-04-20 DIAGNOSIS — I10 Essential (primary) hypertension: Secondary | ICD-10-CM | POA: Diagnosis not present

## 2021-04-20 NOTE — Telephone Encounter (Signed)
Copied from CRM [id number]. Topic: Quick Communication - Home Health Verbal Orders >> Apr 20, 2021 11:09 AM Darron Doom wrote: Caller/Agency: Romilda Garret // [facility]  Callback Number: [id number] ok to LM Requesting OT/PT/Skilled Nursing/Social Work/Speech Therapy: PT  Frequency: 1 w 8

## 2021-04-22 NOTE — Telephone Encounter (Signed)
OK for PT as requested 

## 2021-04-25 DIAGNOSIS — D693 Immune thrombocytopenic purpura: Secondary | ICD-10-CM | POA: Diagnosis not present

## 2021-04-25 DIAGNOSIS — I890 Lymphedema, not elsewhere classified: Secondary | ICD-10-CM | POA: Diagnosis not present

## 2021-04-25 DIAGNOSIS — G4700 Insomnia, unspecified: Secondary | ICD-10-CM | POA: Diagnosis not present

## 2021-04-25 DIAGNOSIS — K219 Gastro-esophageal reflux disease without esophagitis: Secondary | ICD-10-CM | POA: Diagnosis not present

## 2021-04-25 DIAGNOSIS — F3341 Major depressive disorder, recurrent, in partial remission: Secondary | ICD-10-CM | POA: Diagnosis not present

## 2021-04-25 DIAGNOSIS — J302 Other seasonal allergic rhinitis: Secondary | ICD-10-CM | POA: Diagnosis not present

## 2021-04-25 DIAGNOSIS — I10 Essential (primary) hypertension: Secondary | ICD-10-CM | POA: Diagnosis not present

## 2021-04-25 DIAGNOSIS — F419 Anxiety disorder, unspecified: Secondary | ICD-10-CM | POA: Diagnosis not present

## 2021-04-25 DIAGNOSIS — H9313 Tinnitus, bilateral: Secondary | ICD-10-CM | POA: Diagnosis not present

## 2021-04-27 NOTE — Telephone Encounter (Signed)
Verbal given 

## 2021-04-28 DIAGNOSIS — I10 Essential (primary) hypertension: Secondary | ICD-10-CM | POA: Diagnosis not present

## 2021-04-28 DIAGNOSIS — I890 Lymphedema, not elsewhere classified: Secondary | ICD-10-CM | POA: Diagnosis not present

## 2021-04-28 DIAGNOSIS — F419 Anxiety disorder, unspecified: Secondary | ICD-10-CM | POA: Diagnosis not present

## 2021-04-28 DIAGNOSIS — H9313 Tinnitus, bilateral: Secondary | ICD-10-CM | POA: Diagnosis not present

## 2021-04-28 DIAGNOSIS — D693 Immune thrombocytopenic purpura: Secondary | ICD-10-CM | POA: Diagnosis not present

## 2021-04-28 DIAGNOSIS — J302 Other seasonal allergic rhinitis: Secondary | ICD-10-CM | POA: Diagnosis not present

## 2021-04-28 DIAGNOSIS — F3341 Major depressive disorder, recurrent, in partial remission: Secondary | ICD-10-CM | POA: Diagnosis not present

## 2021-04-28 DIAGNOSIS — G4700 Insomnia, unspecified: Secondary | ICD-10-CM | POA: Diagnosis not present

## 2021-04-28 DIAGNOSIS — K219 Gastro-esophageal reflux disease without esophagitis: Secondary | ICD-10-CM | POA: Diagnosis not present

## 2021-05-04 DIAGNOSIS — J302 Other seasonal allergic rhinitis: Secondary | ICD-10-CM | POA: Diagnosis not present

## 2021-05-04 DIAGNOSIS — H9313 Tinnitus, bilateral: Secondary | ICD-10-CM | POA: Diagnosis not present

## 2021-05-04 DIAGNOSIS — F419 Anxiety disorder, unspecified: Secondary | ICD-10-CM | POA: Diagnosis not present

## 2021-05-04 DIAGNOSIS — G4700 Insomnia, unspecified: Secondary | ICD-10-CM | POA: Diagnosis not present

## 2021-05-04 DIAGNOSIS — D693 Immune thrombocytopenic purpura: Secondary | ICD-10-CM | POA: Diagnosis not present

## 2021-05-04 DIAGNOSIS — K219 Gastro-esophageal reflux disease without esophagitis: Secondary | ICD-10-CM | POA: Diagnosis not present

## 2021-05-04 DIAGNOSIS — I890 Lymphedema, not elsewhere classified: Secondary | ICD-10-CM | POA: Diagnosis not present

## 2021-05-04 DIAGNOSIS — I10 Essential (primary) hypertension: Secondary | ICD-10-CM | POA: Diagnosis not present

## 2021-05-04 DIAGNOSIS — F3341 Major depressive disorder, recurrent, in partial remission: Secondary | ICD-10-CM | POA: Diagnosis not present

## 2021-05-06 ENCOUNTER — Encounter: Payer: Self-pay | Admitting: Family Medicine

## 2021-05-06 ENCOUNTER — Ambulatory Visit: Payer: Medicare PPO | Admitting: Family Medicine

## 2021-05-06 ENCOUNTER — Other Ambulatory Visit: Payer: Self-pay

## 2021-05-06 VITALS — BP 95/51 | HR 49 | Ht 76.0 in | Wt 255.0 lb

## 2021-05-06 DIAGNOSIS — I890 Lymphedema, not elsewhere classified: Secondary | ICD-10-CM | POA: Diagnosis not present

## 2021-05-06 DIAGNOSIS — R531 Weakness: Secondary | ICD-10-CM | POA: Diagnosis not present

## 2021-05-06 DIAGNOSIS — R296 Repeated falls: Secondary | ICD-10-CM

## 2021-05-06 MED ORDER — FUROSEMIDE 40 MG PO TABS: 40.0000 mg | ORAL_TABLET | Freq: Every day | ORAL | 3 refills | Status: DC | PRN

## 2021-05-06 NOTE — Patient Instructions (Addendum)
Thank you for coming to the office today.  Increase dose Furosemide from 20 to 40mg  - new pill as needed can skip if don't need, take if swelling is present or worse and weight is up, try to take every other or only few days a week if possible.  We can check potassium sooner if need.  ---------------------------------------- We haven't sent referral yet but may if you are interested or needed  [facility] [address] [place], [place]  Main: [phone]   ---------------------------------------------  Leg cramps - Try spoonful of yellow mustard to relieve leg cramps or try daily to prevent the problem  - OTC natural option is Hyland's Leg Cramps (Dissolving tablet) take as needed for muscle cramps   Please schedule a Follow-up Appointment to: Return in about 3 months (around 08/06/2021) for 3 month apt as scheduled..  If you have any other questions or concerns, please feel free to call the office or send a message through MyChart. You may also schedule an earlier appointment if necessary.  Additionally, you may be receiving a survey about your experience at our office within a few days to 1 week by e-mail or mail. We value your feedback.  08/08/2021, DO [facility], [facility]

## 2021-05-06 NOTE — Progress Notes (Signed)
Subjective:    Patient ID: Craig [name], male    DOB: [birth year], 80 y.o.   MRN: [id number]  Craig [name] is a 80 y.o. male presenting on 05/06/2021 for Fatigue and Extremity Weakness   HPI  Generalized Weakness, lower extremities Lymphedema, lower extremities  Recent ED visit 04/06/21 [facility] ED visit with weakness, discharged. We arranged for Home Health, and eventually scheduled.  He has some home DME, has Hoyer lift for bed, wheelchair, lift chair, 3N1, handicap bars in bathrooms, handicap commode in one bathroom.  He has been now working with Home Health PT therapy x 2-3 sessions He reports some improvement with walking last night, but overall he says some limited results, not resolving the weakness  He had issue with dizziness He has had some increased recurrent falls due to unsteadiness on feet and feeling weak. He will get turned around and dizzy. 1 x week for 8 weeks.  He was on Furosemide 20mg  PRN for period of time and swelling helped, then he stopped going to bathroom. Didn't help any more has stopped med.  Has not seen vascular    Depression screen [facility] 2/9 01/20/2021 07/19/2020 01/16/2020  Decreased Interest 1 2 3   Down, Depressed, Hopeless 0 2 3  PHQ - 2 Score 1 4 6   Altered sleeping 1 3 3   Tired, decreased energy 1 2 3   Change in appetite 1 1 2   Feeling bad or failure about yourself  0 1 1  Trouble concentrating 0 1 0  Moving slowly or fidgety/restless - 1 2  Suicidal thoughts 0 0 0  PHQ-9 Score 4 13 17   Difficult doing work/chores Not difficult at all Somewhat difficult Somewhat difficult  Some recent data might be hidden    Social History   Tobacco Use   Smoking status: Former    Years: 20.00    Types: Cigarettes    Quit date: 07/24/1978    Years since quitting: 42.8   Smokeless tobacco: Former   Tobacco comments:    Quit tobacco in 1980  Vaping Use   Vaping Use: Never used  Substance Use Topics   Alcohol use: Yes    Alcohol/week: 3.0 standard  drinks    Types: 3 Shots of liquor per week    Comment: daily   Drug use: No    Review of Systems Per HPI unless specifically indicated above     Objective:    BP (!) 95/51   Pulse (!) 49   Ht 6\' 4"  (1.93 m)   Wt 255 lb (115.7 kg)   SpO2 95%   BMI 31.04 kg/m   Wt Readings from Last 3 Encounters:  05/06/21 255 lb (115.7 kg)  04/06/21 270 lb (122.5 kg)  01/20/21 283 lb 3.2 oz (128.5 kg)    Physical Exam Vitals and nursing note reviewed.  Constitutional:      General: He is not in acute distress.    Appearance: He is well-developed. He is not diaphoretic.     Comments: Well-appearing, comfortable, cooperative  HENT:     Head: Normocephalic and atraumatic.  Eyes:     General:        Right eye: No discharge.        Left eye: No discharge.     Conjunctiva/sclera: Conjunctivae normal.  Neck:     Thyroid: No thyromegaly.  Cardiovascular:     Rate and Rhythm: Normal rate and regular rhythm.     Pulses: Normal pulses.  Heart sounds: Normal heart sounds. No murmur heard. Pulmonary:     Effort: Pulmonary effort is normal. No respiratory distress.     Breath sounds: Normal breath sounds. No wheezing or rales.  Musculoskeletal:        General: Normal range of motion.     Cervical back: Normal range of motion and neck supple.     Right lower leg: Edema present.     Left lower leg: Edema (+2 pitting edema, lymphedema bilateral) present.  Lymphadenopathy:     Cervical: No cervical adenopathy.  Skin:    General: Skin is warm and dry.     Findings: No erythema or rash.  Neurological:     Mental Status: He is alert and oriented to person, place, and time. Mental status is at baseline.  Psychiatric:        Behavior: Behavior normal.     Comments: Well groomed, good eye contact, normal speech and thoughts     Results for orders placed or performed during the hospital encounter of 04/06/21  Resp Panel by RT-PCR (Flu A&B, Covid) Nasopharyngeal Swab   Specimen:  Nasopharyngeal Swab; Nasopharyngeal(NP) swabs in vial transport medium  Result Value Ref Range   SARS Coronavirus 2 by RT PCR NEGATIVE NEGATIVE   Influenza A by PCR NEGATIVE NEGATIVE   Influenza B by PCR NEGATIVE NEGATIVE  Basic metabolic panel  Result Value Ref Range   Sodium 138 135 - 145 mmol/L   Potassium 3.8 3.5 - 5.1 mmol/L   Chloride 99 98 - 111 mmol/L   CO2 29 22 - 32 mmol/L   Glucose, Bld 107 (H) 70 - 99 mg/dL   BUN 9 8 - 23 mg/dL   Creatinine, Ser 7.78 0.61 - 1.24 mg/dL   Calcium 9.1 8.9 - 24.2 mg/dL   GFR, Estimated >35 >36 mL/min   Anion gap 10 5 - 15  CBC  Result Value Ref Range   WBC 10.3 4.0 - 10.5 K/uL   RBC 3.69 (L) 4.22 - 5.81 MIL/uL   Hemoglobin 14.2 13.0 - 17.0 g/dL   HCT 14.4 31.5 - 40.0 %   MCV 107.9 (H) 80.0 - 100.0 fL   MCH 38.5 (H) 26.0 - 34.0 pg   MCHC 35.7 30.0 - 36.0 g/dL   RDW 86.7 (H) 61.9 - 50.9 %   Platelets 258 150 - 400 K/uL   nRBC 0.0 0.0 - 0.2 %  Urinalysis, Complete w Microscopic Urine, Clean Catch  Result Value Ref Range   Color, Urine YELLOW (A) YELLOW   APPearance CLEAR (A) CLEAR   Specific Gravity, Urine 1.012 1.005 - 1.030   pH 6.0 5.0 - 8.0   Glucose, UA NEGATIVE NEGATIVE mg/dL   Hgb urine dipstick SMALL (A) NEGATIVE   Bilirubin Urine NEGATIVE NEGATIVE   Ketones, ur 5 (A) NEGATIVE mg/dL   Protein, ur NEGATIVE NEGATIVE mg/dL   Nitrite NEGATIVE NEGATIVE   Leukocytes,Ua NEGATIVE NEGATIVE   RBC / HPF 0-5 0 - 5 RBC/hpf   WBC, UA 0-5 0 - 5 WBC/hpf   Bacteria, UA NONE SEEN NONE SEEN   Squamous Epithelial / LPF NONE SEEN 0 - 5   Mucus PRESENT    Hyaline Casts, UA PRESENT   Hepatic function panel  Result Value Ref Range   Total Protein 6.9 6.5 - 8.1 g/dL   Albumin 3.5 3.5 - 5.0 g/dL   AST 85 (H) 15 - 41 U/L   ALT 26 0 - 44 U/L   Alkaline Phosphatase 65  38 - 126 U/L   Total Bilirubin 1.1 0.3 - 1.2 mg/dL   Bilirubin, Direct 0.2 0.0 - 0.2 mg/dL   Indirect Bilirubin 0.9 0.3 - 0.9 mg/dL  Troponin I (High Sensitivity)  Result  Value Ref Range   Troponin I (High Sensitivity) 14 <18 ng/L      Assessment & Plan:   Problem List Items Addressed This Visit     Lymphedema of both lower extremities   Relevant Medications   furosemide (LASIX) 40 MG tablet   Other Visit Diagnoses     Generalized weakness    -  Primary   Recurrent falls           Continue with Home Health PT as scheduled for up to 8 weeks to work on strengthening to improve muscle strength and exercise tolerance / balance. Goal to limit falls  He has already adapted house for mobility, this has been an improvement. Still has falls.  Encouraged use of walker as appropriate to limit fall risk.  Likely lymphedema swelling contributing to weakness in legs, due to heaviness of legs and weakness in muscles.  Will resume trial of Lasix - at higher dose now 40mg  PRN discussed dosing when to use if wt gain or edema, if good urine output can continue PRN, limit use if BP low and may need to HOLD amlodipine in future if blood pressure too low.  If not effective will refer to Vascular specialist next for further evaluation, management, may need lymphedema pump / compression.   Meds ordered this encounter  Medications   furosemide (LASIX) 40 MG tablet    Sig: Take 1 tablet (40 mg total) by mouth daily as needed for fluid or edema.    Dispense:  30 tablet    Refill:  3      Follow up plan: Return in about 3 months (around 08/06/2021) for 3 month apt as scheduled.08/08/2021, DO [facility] 05/06/2021, 2:39 PM

## 2021-05-11 DIAGNOSIS — F419 Anxiety disorder, unspecified: Secondary | ICD-10-CM | POA: Diagnosis not present

## 2021-05-11 DIAGNOSIS — G4700 Insomnia, unspecified: Secondary | ICD-10-CM | POA: Diagnosis not present

## 2021-05-11 DIAGNOSIS — I890 Lymphedema, not elsewhere classified: Secondary | ICD-10-CM | POA: Diagnosis not present

## 2021-05-11 DIAGNOSIS — I10 Essential (primary) hypertension: Secondary | ICD-10-CM | POA: Diagnosis not present

## 2021-05-11 DIAGNOSIS — J302 Other seasonal allergic rhinitis: Secondary | ICD-10-CM | POA: Diagnosis not present

## 2021-05-11 DIAGNOSIS — D693 Immune thrombocytopenic purpura: Secondary | ICD-10-CM | POA: Diagnosis not present

## 2021-05-11 DIAGNOSIS — H9313 Tinnitus, bilateral: Secondary | ICD-10-CM | POA: Diagnosis not present

## 2021-05-11 DIAGNOSIS — K219 Gastro-esophageal reflux disease without esophagitis: Secondary | ICD-10-CM | POA: Diagnosis not present

## 2021-05-11 DIAGNOSIS — F3341 Major depressive disorder, recurrent, in partial remission: Secondary | ICD-10-CM | POA: Diagnosis not present

## 2021-05-18 ENCOUNTER — Telehealth: Payer: Self-pay

## 2021-05-18 DIAGNOSIS — K219 Gastro-esophageal reflux disease without esophagitis: Secondary | ICD-10-CM | POA: Diagnosis not present

## 2021-05-18 DIAGNOSIS — F3341 Major depressive disorder, recurrent, in partial remission: Secondary | ICD-10-CM | POA: Diagnosis not present

## 2021-05-18 DIAGNOSIS — G4700 Insomnia, unspecified: Secondary | ICD-10-CM | POA: Diagnosis not present

## 2021-05-18 DIAGNOSIS — F419 Anxiety disorder, unspecified: Secondary | ICD-10-CM | POA: Diagnosis not present

## 2021-05-18 DIAGNOSIS — I10 Essential (primary) hypertension: Secondary | ICD-10-CM | POA: Diagnosis not present

## 2021-05-18 DIAGNOSIS — H9313 Tinnitus, bilateral: Secondary | ICD-10-CM | POA: Diagnosis not present

## 2021-05-18 DIAGNOSIS — J302 Other seasonal allergic rhinitis: Secondary | ICD-10-CM | POA: Diagnosis not present

## 2021-05-18 DIAGNOSIS — I890 Lymphedema, not elsewhere classified: Secondary | ICD-10-CM | POA: Diagnosis not present

## 2021-05-18 DIAGNOSIS — D693 Immune thrombocytopenic purpura: Secondary | ICD-10-CM | POA: Diagnosis not present

## 2021-05-18 NOTE — Telephone Encounter (Signed)
Noted, he can come in to have further evaluation x-rays if he would like.  Otherwise apply ice and take Tylenol OTC for pain.

## 2021-05-18 NOTE — Telephone Encounter (Signed)
Copied from CRM [id number]. Topic: General - Other >> May 18, 2021  1:02 PM Traci Sermon wrote: Reason for CRM: Soni from [facility] stating the pt fell on 10/18, pt did not want to seek medical attention, but did have some pain on butt, and a bruise, as well as any time he sits or walks a certain way it does hurt a litte, please advise.

## 2021-05-19 NOTE — Telephone Encounter (Signed)
I contacted the patient and he informed me that his symptoms have resolved. No need for any further evaluation.

## 2021-05-25 DIAGNOSIS — F3341 Major depressive disorder, recurrent, in partial remission: Secondary | ICD-10-CM | POA: Diagnosis not present

## 2021-05-25 DIAGNOSIS — J302 Other seasonal allergic rhinitis: Secondary | ICD-10-CM | POA: Diagnosis not present

## 2021-05-25 DIAGNOSIS — K219 Gastro-esophageal reflux disease without esophagitis: Secondary | ICD-10-CM | POA: Diagnosis not present

## 2021-05-25 DIAGNOSIS — F419 Anxiety disorder, unspecified: Secondary | ICD-10-CM | POA: Diagnosis not present

## 2021-05-25 DIAGNOSIS — D693 Immune thrombocytopenic purpura: Secondary | ICD-10-CM | POA: Diagnosis not present

## 2021-05-25 DIAGNOSIS — I890 Lymphedema, not elsewhere classified: Secondary | ICD-10-CM | POA: Diagnosis not present

## 2021-05-25 DIAGNOSIS — I10 Essential (primary) hypertension: Secondary | ICD-10-CM | POA: Diagnosis not present

## 2021-05-25 DIAGNOSIS — H9313 Tinnitus, bilateral: Secondary | ICD-10-CM | POA: Diagnosis not present

## 2021-05-25 DIAGNOSIS — G4700 Insomnia, unspecified: Secondary | ICD-10-CM | POA: Diagnosis not present

## 2021-06-01 DIAGNOSIS — D693 Immune thrombocytopenic purpura: Secondary | ICD-10-CM | POA: Diagnosis not present

## 2021-06-01 DIAGNOSIS — G4700 Insomnia, unspecified: Secondary | ICD-10-CM | POA: Diagnosis not present

## 2021-06-01 DIAGNOSIS — J302 Other seasonal allergic rhinitis: Secondary | ICD-10-CM | POA: Diagnosis not present

## 2021-06-01 DIAGNOSIS — H9313 Tinnitus, bilateral: Secondary | ICD-10-CM | POA: Diagnosis not present

## 2021-06-01 DIAGNOSIS — I10 Essential (primary) hypertension: Secondary | ICD-10-CM | POA: Diagnosis not present

## 2021-06-01 DIAGNOSIS — F419 Anxiety disorder, unspecified: Secondary | ICD-10-CM | POA: Diagnosis not present

## 2021-06-01 DIAGNOSIS — I890 Lymphedema, not elsewhere classified: Secondary | ICD-10-CM | POA: Diagnosis not present

## 2021-06-01 DIAGNOSIS — K219 Gastro-esophageal reflux disease without esophagitis: Secondary | ICD-10-CM | POA: Diagnosis not present

## 2021-06-01 DIAGNOSIS — F3341 Major depressive disorder, recurrent, in partial remission: Secondary | ICD-10-CM | POA: Diagnosis not present

## 2021-06-08 DIAGNOSIS — I10 Essential (primary) hypertension: Secondary | ICD-10-CM | POA: Diagnosis not present

## 2021-06-08 DIAGNOSIS — I890 Lymphedema, not elsewhere classified: Secondary | ICD-10-CM | POA: Diagnosis not present

## 2021-06-08 DIAGNOSIS — F419 Anxiety disorder, unspecified: Secondary | ICD-10-CM | POA: Diagnosis not present

## 2021-06-08 DIAGNOSIS — F3341 Major depressive disorder, recurrent, in partial remission: Secondary | ICD-10-CM | POA: Diagnosis not present

## 2021-06-08 DIAGNOSIS — D693 Immune thrombocytopenic purpura: Secondary | ICD-10-CM | POA: Diagnosis not present

## 2021-06-08 DIAGNOSIS — H9313 Tinnitus, bilateral: Secondary | ICD-10-CM | POA: Diagnosis not present

## 2021-06-08 DIAGNOSIS — G4700 Insomnia, unspecified: Secondary | ICD-10-CM | POA: Diagnosis not present

## 2021-06-08 DIAGNOSIS — K219 Gastro-esophageal reflux disease without esophagitis: Secondary | ICD-10-CM | POA: Diagnosis not present

## 2021-06-08 DIAGNOSIS — J302 Other seasonal allergic rhinitis: Secondary | ICD-10-CM | POA: Diagnosis not present

## 2021-06-23 ENCOUNTER — Other Ambulatory Visit: Payer: Self-pay | Admitting: Family Medicine

## 2021-06-23 DIAGNOSIS — I890 Lymphedema, not elsewhere classified: Secondary | ICD-10-CM

## 2021-06-24 NOTE — Telephone Encounter (Signed)
Requested Prescriptions  Pending Prescriptions Disp Refills  . furosemide (LASIX) 40 MG tablet [Pharmacy Med Name: FUROSEMIDE 40 MG TABLET] 90 tablet 0    Sig: TAKE 1 TABLET (40 MG TOTAL) BY MOUTH DAILY AS NEEDED FOR FLUID OR EDEMA.     Cardiovascular:  Diuretics - Loop Passed - 06/23/2021 11:12 AM      Passed - K in normal range and within 360 days    Potassium  Date Value Ref Range Status  04/06/2021 3.8 3.5 - 5.1 mmol/L Final  07/25/2014 3.4 (L) 3.5 - 5.1 mmol/L Final         Passed - Ca in normal range and within 360 days    Calcium  Date Value Ref Range Status  04/06/2021 9.1 8.9 - 10.3 mg/dL Final   Calcium, Total  Date Value Ref Range Status  07/25/2014 9.4 8.5 - 10.1 mg/dL Final         Passed - Na in normal range and within 360 days    Sodium  Date Value Ref Range Status  04/06/2021 138 135 - 145 mmol/L Final  06/24/2019 146 (H) 134 - 144 mmol/L Final  07/25/2014 141 136 - 145 mmol/L Final         Passed - Cr in normal range and within 360 days    Creat  Date Value Ref Range Status  07/12/2020 0.84 0.70 - 1.18 mg/dL Final    Comment:    For patients >31 years of age, the reference limit for Creatinine is approximately 13% higher for people identified as African-American. .    Creatinine, Ser  Date Value Ref Range Status  04/06/2021 0.71 0.61 - 1.24 mg/dL Final         Passed - Last BP in normal range    BP Readings from Last 1 Encounters:  05/06/21 (!) 95/51         Passed - Valid encounter within last 6 months    Recent Outpatient Visits          1 month ago Generalized weakness   [facility], Netta Neat, DO   5 months ago Lymphedema of both lower extremities   [facility] Smitty Cords, DO   11 months ago Annual physical exam   [facility] Smitty Cords, DO   1 year ago Moderate recurrent major depression [facility])   [facility] Smitty Cords, DO   1 year ago Annual physical exam   [facility] Smitty Cords, DO      Future Appointments            In 1 month Althea Charon, Netta Neat, DO [facility]

## 2021-07-06 ENCOUNTER — Other Ambulatory Visit: Payer: Self-pay | Admitting: Family Medicine

## 2021-07-06 DIAGNOSIS — I10 Essential (primary) hypertension: Secondary | ICD-10-CM

## 2021-07-06 NOTE — Telephone Encounter (Signed)
Requested Prescriptions  Pending Prescriptions Disp Refills   losartan (COZAAR) 100 MG tablet [Pharmacy Med Name: LOSARTAN POTASSIUM 100 MG Tablet] 90 tablet 0    Sig: TAKE 1 TABLET (100 MG TOTAL) DAILY.     Cardiovascular:  Angiotensin Receptor Blockers Passed - 07/06/2021  3:21 AM      Passed - Cr in normal range and within 180 days    Creat  Date Value Ref Range Status  07/12/2020 0.84 0.70 - 1.18 mg/dL Final    Comment:    For patients >79 years of age, the reference limit for Creatinine is approximately 13% higher for people identified as African-American. .    Creatinine, Ser  Date Value Ref Range Status  04/06/2021 0.71 0.61 - 1.24 mg/dL Final         Passed - K in normal range and within 180 days    Potassium  Date Value Ref Range Status  04/06/2021 3.8 3.5 - 5.1 mmol/L Final  07/25/2014 3.4 (L) 3.5 - 5.1 mmol/L Final         Passed - Patient is not pregnant      Passed - Last BP in normal range    BP Readings from Last 1 Encounters:  05/06/21 (!) 95/51         Passed - Valid encounter within last 6 months    Recent Outpatient Visits          2 months ago Generalized weakness   [facility] [place], Netta Neat, DO   5 months ago Lymphedema of both lower extremities   [facility] Smitty Cords, DO   11 months ago Annual physical exam   [facility] Smitty Cords, DO   1 year ago Moderate recurrent major depression [facility])   [facility] Smitty Cords, DO   1 year ago Annual physical exam   [facility] Smitty Cords, DO      Future Appointments            In 1 month Althea Charon, Netta Neat, DO [facility], [facility]

## 2021-08-03 ENCOUNTER — Other Ambulatory Visit: Payer: Medicare PPO

## 2021-08-10 ENCOUNTER — Ambulatory Visit (INDEPENDENT_AMBULATORY_CARE_PROVIDER_SITE_OTHER): Payer: Medicare PPO | Admitting: Family Medicine

## 2021-08-10 ENCOUNTER — Other Ambulatory Visit: Payer: Self-pay

## 2021-08-10 ENCOUNTER — Encounter: Payer: Self-pay | Admitting: Family Medicine

## 2021-08-10 VITALS — BP 112/54 | HR 101 | Ht 76.0 in | Wt 253.4 lb

## 2021-08-10 DIAGNOSIS — Z0000 Encounter for general adult medical examination without abnormal findings: Secondary | ICD-10-CM

## 2021-08-10 DIAGNOSIS — E782 Mixed hyperlipidemia: Secondary | ICD-10-CM

## 2021-08-10 DIAGNOSIS — N401 Enlarged prostate with lower urinary tract symptoms: Secondary | ICD-10-CM

## 2021-08-10 DIAGNOSIS — R351 Nocturia: Secondary | ICD-10-CM | POA: Diagnosis not present

## 2021-08-10 DIAGNOSIS — F331 Major depressive disorder, recurrent, moderate: Secondary | ICD-10-CM

## 2021-08-10 DIAGNOSIS — Z86718 Personal history of other venous thrombosis and embolism: Secondary | ICD-10-CM

## 2021-08-10 DIAGNOSIS — Z7901 Long term (current) use of anticoagulants: Secondary | ICD-10-CM

## 2021-08-10 DIAGNOSIS — R7309 Other abnormal glucose: Secondary | ICD-10-CM | POA: Diagnosis not present

## 2021-08-10 DIAGNOSIS — I890 Lymphedema, not elsewhere classified: Secondary | ICD-10-CM

## 2021-08-10 DIAGNOSIS — I2699 Other pulmonary embolism without acute cor pulmonale: Secondary | ICD-10-CM | POA: Diagnosis not present

## 2021-08-10 NOTE — Patient Instructions (Addendum)
Thank you for coming to the office today.  Referral for lower extremity swelling, I believe you have Lymphedema diagnosis in both that can cause swelling and pain, and is hard to treat, especially if fluid pills are not working.  [facility] [address] [place], [place] [address]  Main: [phone]   ----------  Keep off Furosemide for now since it is not resolving the fluid swelling.  Keep in touch if needed for med refills.  Labs today stay tuned on results.  Start nasal steroid Flonase 2 sprays in each nostril daily for 4-6 weeks, may repeat course seasonally or as needed   Please schedule a Follow-up Appointment to: Return in about 4 months (around 12/08/2021) for 4 month follow-up Lymphedema (vascular), HTN.  If you have any other questions or concerns, please feel free to call the office or send a message through MyChart. You may also schedule an earlier appointment if necessary.  Additionally, you may be receiving a survey about your experience at our office within a few days to 1 week by e-mail or mail. We value your feedback.  Saralyn Pilar, DO [facility], [place]

## 2021-08-10 NOTE — Progress Notes (Signed)
Subjective:    Patient ID: Craig [name], male    DOB: 1940-12-07, 81 y.o.   MRN: [id number]  Craig [name] is a 81 y.o. male presenting on 08/10/2021 for Annual Exam   HPI  Here for Annual Physical and Fsting labs.  Follow up Lymphedema, lower extremities Generalized Weakness, lower extremities Recent history 04/2021, managed with diuretic and medication and lifestyle modification. He is interested in pursuing vascular referral now as discussed before He was on Furosemide 20mg  PRN for period of time and swelling helped, then he stopped going to bathroom. Didn't help any more has stopped med. 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 Today has normal BP Current Meds - Amlodipine 5mg  daily, Metoprolol XL 25mg  daily   Reports good compliance, took meds today. Tolerating well, w/o complaints.   Lifestyle - Screening A1c 5.4, normal - fam history of DM - Screening Thyroid panel - normal TSH Free T4    HYPERLIPIDEMIA: - Reports no concerns. Last lipid panel reviewed, controlled,. Improved TG - Currently taking Atorvastatin 40mg , tolerating well without side effects or myalgias Lifestyle - Diet: Limited diet - Exercise: No regular exercise   Recurrent PE/DVT, on anticoagulation lifelong Reports no new problem. Continues to take Xarelto daily for anticoagulation   Depression screen [facility] 2/9 08/10/2021 01/20/2021 07/19/2020  Decreased Interest 3 1 2   Down, Depressed, Hopeless 3 0 2  PHQ - 2 Score 6 1 4   Altered sleeping 3 1 3   Tired, decreased energy 3 1 2   Change in appetite 1 1 1   Feeling bad or failure about yourself  1 0 1  Trouble concentrating 3 0 1  Moving slowly or fidgety/restless 1 - 1   Suicidal thoughts 0 0 0  PHQ-9 Score 18 4 13   Difficult doing work/chores Extremely dIfficult Not difficult at all Somewhat difficult  Some recent data might be hidden    Past Medical History:  Diagnosis Date   Anxiety    Essential hypertension    Pulmonary emboli ([place])    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[facility])    Past Surgical History:  Procedure Laterality Date   LEFT HEART CATHETERIZATION WITH CORONARY ANGIOGRAM Bilateral 07/27/2014   Procedure: LEFT HEART CATHETERIZATION WITH CORONARY ANGIOGRAM;  Surgeon: Lorretta Harp, MD;  Location[facility] CATH LAB;  Service: Cardiovascular;  Laterality: Bilateral;   SPLENECTOMY, TOTAL     TTP   Social History   Socioeconomic History   Marital status: Single    Spouse name: Not on file   Number of children: Not on file   Years of education: Not on file   Highest education level: Not on file  Occupational History   Occupation: Retired Physiological scientist)  Tobacco Use   Smoking status: Former    Years: 20.00    Types: Cigarettes    Quit date: 07/24/1978    Years since quitting: 43.0   Smokeless tobacco: Former   Tobacco comments:    Quit tobacco in [place] Use   Vaping Use: Never used  Substance and Sexual Activity   Alcohol use: Yes    Alcohol/week: 3.0 standard drinks    Types: 3 Shots of liquor per week    Comment: daily  Drug use: No   Sexual activity: Yes    Birth control/protection: None  Other Topics Concern   Not on file  Social History Narrative   Retired from [employer]. Retired x 20 years.  Two children.     Social Determinants of Health   Financial Resource Strain: Not on file  Food Insecurity: Not on file  Transportation Needs: Not on file  Physical Activity: Not on file  Stress: Not on file  Social Connections: Not on file  Intimate Partner Violence: Not on file   Family History  Problem Relation Age of Onset   CAD Father 16   Kidney failure Mother    CAD Brother    Current Outpatient  Medications on File Prior to Visit  Medication Sig   amLODipine (NORVASC) 5 MG tablet TAKE 1 TABLET (5 MG TOTAL) BY MOUTH DAILY.   atorvastatin (LIPITOR) 40 MG tablet TAKE 1 TABLET (40 MG TOTAL) BY MOUTH DAILY.   cholecalciferol (VITAMIN D) 1000 UNITS tablet Take 1,000 Units by mouth daily.   escitalopram (LEXAPRO) 20 MG tablet TAKE 1 TABLET EVERY DAY   fluticasone (FLONASE) 50 MCG/ACT nasal spray Place 2 sprays into both nostrils daily. Use for 4-6 weeks then stop and use seasonally or as needed.   furosemide (LASIX) 40 MG tablet TAKE 1 TABLET (40 MG TOTAL) BY MOUTH DAILY AS NEEDED FOR FLUID OR EDEMA.   losartan (COZAAR) 100 MG tablet TAKE 1 TABLET (100 MG TOTAL) DAILY.   Melatonin 5 MG TABS Take 10 mg by mouth at bedtime. 3 tabs   metoprolol succinate (TOPROL-XL) 25 MG 24 hr tablet TAKE 1 TABLET (25 MG TOTAL) BY MOUTH DAILY.   omeprazole (PRILOSEC) 40 MG capsule TAKE 1 CAPSULE (40 MG TOTAL) BY MOUTH DAILY.   vitamin B-12 (CYANOCOBALAMIN) 500 MCG tablet Take 500 mcg by mouth daily.   vitamin E 1000 UNIT capsule Take 1,000 Units by mouth daily.   XARELTO 20 MG TABS tablet TAKE 1 TABLET (20 MG TOTAL) BY MOUTH DAILY WITH SUPPER.   Zoster Vaccine Adjuvanted [facility]) injection Inject 0.20mL intramuscular once. Then repeat dose after 2 months.   No current facility-administered medications on file prior to visit.    Review of Systems  Constitutional:  Negative for activity change, appetite change, chills, diaphoresis, fatigue and fever.  HENT:  Negative for congestion and hearing loss.   Eyes:  Negative for visual disturbance.  Respiratory:  Negative for cough, chest tightness, shortness of breath and wheezing.   Cardiovascular:  Positive for leg swelling. Negative for chest pain and palpitations.  Gastrointestinal:  Negative for abdominal pain, constipation, diarrhea, nausea and vomiting.  Genitourinary:  Negative for dysuria, frequency and hematuria.  Musculoskeletal:  Negative for  arthralgias and neck pain.  Skin:  Negative for rash.  Neurological:  Negative for dizziness, weakness, light-headedness, numbness and headaches.  Hematological:  Negative for adenopathy.  Psychiatric/Behavioral:  Negative for behavioral problems, dysphoric mood and sleep disturbance.   Per HPI unless specifically indicated above     Objective:    BP (!) 112/54    Pulse (!) 101    Ht 6\' 4"  (1.93 m)    Wt 253 lb 6.4 oz (114.9 kg)    SpO2 90%    BMI 30.84 kg/m   Wt Readings from Last 3 Encounters:  08/10/21 253 lb 6.4 oz (114.9 kg)  05/06/21 255 lb (115.7 kg)  04/06/21 270 lb (122.5 kg)    Physical Exam Vitals and nursing note reviewed.  Constitutional:      General: He is not in acute distress.    Appearance: He is well-developed. He is not diaphoretic.     Comments: Well-appearing, comfortable, cooperative  HENT:     Head: Normocephalic and atraumatic.     Ears:     Comments: Effusion R>L Eyes:     General:        Right eye: No discharge.        Left eye: No discharge.     Conjunctiva/sclera: Conjunctivae normal.     Pupils: Pupils are equal, round, and reactive to light.  Neck:     Thyroid: No thyromegaly.  Cardiovascular:     Rate and Rhythm: Normal rate and regular rhythm.     Pulses: Normal pulses.     Heart sounds: Normal heart sounds. No murmur heard. Pulmonary:     Effort: Pulmonary effort is normal. No respiratory distress.     Breath sounds: Normal breath sounds. No wheezing or rales.  Abdominal:     General: Bowel sounds are normal. There is no distension.     Palpations: Abdomen is soft. There is no mass.     Tenderness: There is no abdominal tenderness.  Musculoskeletal:        General: No tenderness. Normal range of motion.     Cervical back: Normal range of motion and neck supple.     Right lower leg: Edema present.     Left lower leg: Edema present.     Comments: Upper / Lower Extremities: - Normal muscle tone, strength bilateral upper extremities  5/5, lower extremities 5/5  Lymphadenopathy:     Cervical: No cervical adenopathy.  Skin:    General: Skin is warm and dry.     Findings: No erythema or rash.  Neurological:     Mental Status: He is alert and oriented to person, place, and time.     Comments: Distal sensation intact to light touch all extremities  Psychiatric:        Mood and Affect: Mood normal.        Behavior: Behavior normal.        Thought Content: Thought content normal.     Comments: Well groomed, good eye contact, normal speech and thoughts   Results for orders placed or performed during the hospital encounter of 04/06/21  Resp Panel by RT-PCR (Flu A&B, Covid) Nasopharyngeal Swab   Specimen: Nasopharyngeal Swab; Nasopharyngeal(NP) swabs in vial transport medium  Result Value Ref Range   SARS Coronavirus 2 by RT PCR NEGATIVE NEGATIVE   Influenza A by PCR NEGATIVE NEGATIVE   Influenza B by PCR NEGATIVE NEGATIVE  Basic metabolic panel  Result Value Ref Range   Sodium 138 135 - 145 mmol/L   Potassium 3.8 3.5 - 5.1 mmol/L   Chloride 99 98 - 111 mmol/L   CO2 29 22 - 32 mmol/L   Glucose, Bld 107 (H) 70 - 99 mg/dL   BUN 9 8 - 23 mg/dL   Creatinine, Ser 0.71 0.61 - 1.24 mg/dL   Calcium 9.1 8.9 - 10.3 mg/dL   GFR, Estimated >60 >60 mL/min   Anion gap 10 5 - 15  CBC  Result Value Ref Range   WBC 10.3 4.0 - 10.5 K/uL   RBC 3.69 (L) 4.22 - 5.81 MIL/uL   Hemoglobin 14.2 13.0 - 17.0 g/dL   HCT 39.8 39.0 - 52.0 %   MCV 107.9 (H) 80.0 - 100.0 fL   MCH 38.5 (H) 26.0 -  34.0 pg   MCHC 35.7 30.0 - 36.0 g/dL   RDW 21.1 (H) 11.5 - 15.5 %   Platelets 258 150 - 400 K/uL   nRBC 0.0 0.0 - 0.2 %  Urinalysis, Complete w Microscopic Urine, Clean Catch  Result Value Ref Range   Color, Urine YELLOW (A) YELLOW   APPearance CLEAR (A) CLEAR   Specific Gravity, Urine 1.012 1.005 - 1.030   pH 6.0 5.0 - 8.0   Glucose, UA NEGATIVE NEGATIVE mg/dL   Hgb urine dipstick SMALL (A) NEGATIVE   Bilirubin Urine NEGATIVE NEGATIVE    Ketones, ur 5 (A) NEGATIVE mg/dL   Protein, ur NEGATIVE NEGATIVE mg/dL   Nitrite NEGATIVE NEGATIVE   Leukocytes,Ua NEGATIVE NEGATIVE   RBC / HPF 0-5 0 - 5 RBC/hpf   WBC, UA 0-5 0 - 5 WBC/hpf   Bacteria, UA NONE SEEN NONE SEEN   Squamous Epithelial / LPF NONE SEEN 0 - 5   Mucus PRESENT    Hyaline Casts, UA PRESENT   Hepatic function panel  Result Value Ref Range   Total Protein 6.9 6.5 - 8.1 g/dL   Albumin 3.5 3.5 - 5.0 g/dL   AST 85 (H) 15 - 41 U/L   ALT 26 0 - 44 U/L   Alkaline Phosphatase 65 38 - 126 U/L   Total Bilirubin 1.1 0.3 - 1.2 mg/dL   Bilirubin, Direct 0.2 0.0 - 0.2 mg/dL   Indirect Bilirubin 0.9 0.3 - 0.9 mg/dL  Troponin I (High Sensitivity)  Result Value Ref Range   Troponin I (High Sensitivity) 14 <18 ng/L      Assessment & Plan:   Problem List Items Addressed This Visit     Recurrent pulmonary emboli (HCC)   Relevant Orders   CBC with Differential/Platelet   Nocturia associated with benign prostatic hyperplasia   Relevant Orders   PSA   Moderate recurrent major depression (HCC)   Lymphedema of both lower extremities   Relevant Orders   Ambulatory referral to Vascular Surgery   COMPLETE METABOLIC PANEL WITH GFR   HLD (hyperlipidemia)   Relevant Orders   COMPLETE METABOLIC PANEL WITH GFR   Lipid panel   History of DVT of lower extremity   Relevant Orders   CBC with Differential/Platelet   Chronic anticoagulation   Relevant Orders   COMPLETE METABOLIC PANEL WITH GFR   CBC with Differential/Platelet   Other Visit Diagnoses     Annual physical exam    -  Primary   Relevant Orders   COMPLETE METABOLIC PANEL WITH GFR   Lipid panel   CBC with Differential/Platelet   Hemoglobin A1c   PSA   Abnormal glucose       Relevant Orders   Hemoglobin A1c      Updated Health Maintenance information Labs ordered Encouraged improvement to lifestyle with diet and exercise Goal of weight loss  Continue on anticoagulation chronic for history DVT    Referral for lower extremity swelling, I believe you have Lymphedema diagnosis in both that can cause swelling and pain, and is hard to treat, especially if fluid pills are not working.  [facility], [facility] [place], [place] [address]  Main: [phone]   ----------  Keep off Furosemide for now since it is not resolving the fluid swelling.  Keep in touch if needed for med refills.  Labs today stay tuned on results.  Start nasal steroid Flonase 2 sprays in each nostril daily for 4-6 weeks, may repeat course  seasonally or as needed  Eustachian tube dysfunction No orders of the defined types were placed in this encounter.     Follow up plan: Return in about 4 months (around 12/08/2021) for 4 month follow-up Lymphedema (vascular), HTN.  Nobie Putnam, [facility] 08/10/2021, 11:02 AM

## 2021-08-11 LAB — CBC WITH DIFFERENTIAL/PLATELET
Absolute Monocytes: 525 cells/uL (ref 200–950)
Basophils Absolute: 20 cells/uL (ref 0–200)
Basophils Relative: 0.4 %
Eosinophils Absolute: 10 cells/uL — ABNORMAL LOW (ref 15–500)
Eosinophils Relative: 0.2 %
HCT: 42.4 % (ref 38.5–50.0)
Hemoglobin: 13.9 g/dL (ref 13.2–17.1)
Lymphs Abs: 1810 cells/uL (ref 850–3900)
MCH: 34.9 pg — ABNORMAL HIGH (ref 27.0–33.0)
MCHC: 32.8 g/dL (ref 32.0–36.0)
MCV: 106.5 fL — ABNORMAL HIGH (ref 80.0–100.0)
MPV: 11.4 fL (ref 7.5–12.5)
Monocytes Relative: 10.5 %
Neutro Abs: 2635 cells/uL (ref 1500–7800)
Neutrophils Relative %: 52.7 %
Platelets: 265 10*3/uL (ref 140–400)
RBC: 3.98 10*6/uL — ABNORMAL LOW (ref 4.20–5.80)
RDW: 12.8 % (ref 11.0–15.0)
Total Lymphocyte: 36.2 %
WBC: 5 10*3/uL (ref 3.8–10.8)

## 2021-08-11 LAB — HEMOGLOBIN A1C
Hgb A1c MFr Bld: 5.5 % of total Hgb (ref <5.7)
Mean Plasma Glucose: 111 mg/dL
eAG (mmol/L): 6.2 mmol/L

## 2021-08-11 LAB — PSA: PSA: 0.64 ng/mL (ref <=4.00)

## 2021-08-11 LAB — COMPLETE METABOLIC PANEL WITH GFR
AG Ratio: 1.6 (calc) (ref 1.0–2.5)
ALT: 9 U/L (ref 9–46)
AST: 20 U/L (ref 10–35)
Albumin: 3.8 g/dL (ref 3.6–5.1)
Alkaline phosphatase (APISO): 58 U/L (ref 35–144)
BUN/Creatinine Ratio: 16 (calc) (ref 6–22)
BUN: 11 mg/dL (ref 7–25)
CO2: 36 mmol/L — ABNORMAL HIGH (ref 20–32)
Calcium: 9.4 mg/dL (ref 8.6–10.3)
Chloride: 99 mmol/L (ref 98–110)
Creat: 0.67 mg/dL — ABNORMAL LOW (ref 0.70–1.28)
Globulin: 2.4 g/dL (calc) (ref 1.9–3.7)
Glucose, Bld: 96 mg/dL (ref 65–99)
Potassium: 4.5 mmol/L (ref 3.5–5.3)
Sodium: 140 mmol/L (ref 135–146)
Total Bilirubin: 1 mg/dL (ref 0.2–1.2)
Total Protein: 6.2 g/dL (ref 6.1–8.1)
eGFR: 95 mL/min/{1.73_m2} (ref >=60)

## 2021-08-11 LAB — LIPID PANEL
Cholesterol: 126 mg/dL (ref <200)
HDL: 68 mg/dL (ref >=40)
LDL Cholesterol (Calc): 35 mg/dL (calc)
Non-HDL Cholesterol (Calc): 58 mg/dL (calc) (ref <130)
Total CHOL/HDL Ratio: 1.9 (calc) (ref <5.0)
Triglycerides: 146 mg/dL (ref <150)

## 2021-08-18 ENCOUNTER — Encounter: Payer: Self-pay | Admitting: Family Medicine

## 2021-08-27 ENCOUNTER — Other Ambulatory Visit: Payer: Self-pay | Admitting: Family Medicine

## 2021-08-27 DIAGNOSIS — K219 Gastro-esophageal reflux disease without esophagitis: Secondary | ICD-10-CM

## 2021-08-27 NOTE — Telephone Encounter (Signed)
Requested Prescriptions  Pending Prescriptions Disp Refills   omeprazole (PRILOSEC) 40 MG capsule [Pharmacy Med Name: OMEPRAZOLE 40 MG Capsule Delayed Release] 90 capsule 3    Sig: TAKE 1 CAPSULE EVERY DAY     Gastroenterology: Proton Pump Inhibitors Passed - 08/27/2021  3:32 AM      Passed - Valid encounter within last 12 months    Recent Outpatient Visits          2 weeks ago Annual physical exam   [facility] Smitty Cords, DO   3 months ago Generalized weakness   [facility] [place], Netta Neat, DO   7 months ago Lymphedema of both lower extremities   [facility] [place], Netta Neat, DO   1 year ago Annual physical exam   [facility] Smitty Cords, DO   1 year ago Moderate recurrent major depression [facility])   [facility] Althea Charon, Netta Neat, DO      Future Appointments            In 3 months Althea Charon, Netta Neat, DO [facility], [facility]

## 2021-09-18 ENCOUNTER — Other Ambulatory Visit: Payer: Self-pay | Admitting: Family Medicine

## 2021-09-18 DIAGNOSIS — F5101 Primary insomnia: Secondary | ICD-10-CM

## 2021-09-18 DIAGNOSIS — F331 Major depressive disorder, recurrent, moderate: Secondary | ICD-10-CM

## 2021-09-20 NOTE — Telephone Encounter (Signed)
Requested Prescriptions  Pending Prescriptions Disp Refills   escitalopram (LEXAPRO) 20 MG tablet [Pharmacy Med Name: ESCITALOPRAM OXALATE 20 MG Tablet] 90 tablet 1    Sig: TAKE 1 TABLET EVERY DAY     Psychiatry:  Antidepressants - SSRI Passed - 09/18/2021  1:53 PM      Passed - Completed PHQ-2 or PHQ-9 in the last 360 days      Passed - Valid encounter within last 6 months    Recent Outpatient Visits          1 month ago Annual physical exam   [facility] [place] Smitty Cords, DO   4 months ago Generalized weakness   [facility] [place], Netta Neat, DO   8 months ago Lymphedema of both lower extremities   [facility] [place], Netta Neat, DO   1 year ago Annual physical exam   [facility] Smitty Cords, DO   1 year ago Moderate recurrent major depression [facility])   [facility] Althea Charon, Netta Neat, DO      Future Appointments            In 2 months Althea Charon, Netta Neat, DO [facility]

## 2021-10-04 ENCOUNTER — Encounter (INDEPENDENT_AMBULATORY_CARE_PROVIDER_SITE_OTHER): Payer: Self-pay

## 2021-10-04 ENCOUNTER — Ambulatory Visit (INDEPENDENT_AMBULATORY_CARE_PROVIDER_SITE_OTHER): Payer: Medicare PPO | Admitting: Vascular Surgery

## 2021-10-04 ENCOUNTER — Other Ambulatory Visit: Payer: Self-pay

## 2021-10-04 ENCOUNTER — Encounter (INDEPENDENT_AMBULATORY_CARE_PROVIDER_SITE_OTHER): Payer: Self-pay | Admitting: Vascular Surgery

## 2021-10-04 VITALS — BP 98/62 | HR 72 | Resp 17 | Ht 75.0 in | Wt 253.0 lb

## 2021-10-04 DIAGNOSIS — I890 Lymphedema, not elsewhere classified: Secondary | ICD-10-CM

## 2021-10-04 DIAGNOSIS — I10 Essential (primary) hypertension: Secondary | ICD-10-CM

## 2021-10-04 DIAGNOSIS — E782 Mixed hyperlipidemia: Secondary | ICD-10-CM | POA: Diagnosis not present

## 2021-10-04 DIAGNOSIS — I2699 Other pulmonary embolism without acute cor pulmonale: Secondary | ICD-10-CM | POA: Diagnosis not present

## 2021-10-04 NOTE — Progress Notes (Signed)
? ? ?Patient ID: Craig [name], male   DOB: [birth year], 81 y.o.   MRN: [id number] ? ?Chief Complaint  ?Patient presents with  ? Establish Care  ?  Ref by PCP Dr Raliegh Ip  for lymphedema consult  ? ? ?HPI ?Craig [name] is a 80 y.o. male.  I am asked to see the patient by Dr. Parks Ranger for evaluation of leg swelling and limb edema.  The patient has had steady decline in his activity over several months and over that time he has become essentially wheelchair-bound.  He does not really elevate his legs.  He does not wear compression socks although he has some.  Both legs are swollen about the same.  The legs are heavy and she does have previous history of pulmonary embolus and DVT.  He has also had a stroke and is very debilitated at this point.   ? ? ?Past Medical History:  ?Diagnosis Date  ? Anxiety   ? Essential hypertension   ? Pulmonary emboli ([place])   ? Stroke [facility])   ? 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([place])   ? ? ?Past Surgical History:  ?Procedure Laterality Date  ? LEFT HEART CATHETERIZATION WITH CORONARY ANGIOGRAM Bilateral 07/27/2014  ? Procedure: LEFT HEART CATHETERIZATION WITH CORONARY ANGIOGRAM;  Surgeon: Lorretta Harp, MD;  Location: [facility] CATH LAB;  Service: Cardiovascular;  Laterality: Bilateral;  ? SPLENECTOMY, TOTAL    ? TTP  ? ? ? ?Family History  ?Problem Relation Age of Onset  ? CAD Father 46  ? Kidney failure Mother   ? CAD Brother   ? ? ? ? ?Social History  ? ?Tobacco Use  ? Smoking status: Former  ?  Years: 20.00  ?  Types: Cigarettes  ?  Quit date: 07/24/1978  ?  Years since quitting: 43.2  ? Smokeless tobacco: Former  ? Tobacco comments:  ?  Quit tobacco in 1980  ?Vaping Use  ? Vaping Use: Never used  ?Substance Use Topics  ? Alcohol use: Yes  ?  Alcohol/week: 3.0 standard drinks  ?  Types: 3 Shots of liquor per week  ?  Comment: daily  ? Drug use: No  ? ? ? ?Allergies  ?Allergen Reactions  ? Trazodone And Nefazodone Itching and Swelling  ? ? ?Current Outpatient Medications  ?Medication Sig Dispense Refill  ? amLODipine  (NORVASC) 5 MG tablet TAKE 1 TABLET (5 MG TOTAL) BY MOUTH DAILY. 90 tablet 3  ? atorvastatin (LIPITOR) 40 MG tablet TAKE 1 TABLET (40 MG TOTAL) BY MOUTH DAILY. 90 tablet 3  ? cholecalciferol (VITAMIN D) 1000 UNITS tablet Take 1,000 Units by mouth daily.    ? escitalopram (LEXAPRO) 20 MG tablet TAKE 1 TABLET EVERY DAY 90 tablet 1  ? fluticasone (FLONASE) 50 MCG/ACT nasal spray Place 2 sprays into both nostrils daily. Use for 4-6 weeks then stop and use seasonally or as needed. 16 g 3  ? furosemide (LASIX) 40 MG tablet TAKE 1 TABLET (40 MG TOTAL) BY MOUTH DAILY AS NEEDED FOR FLUID OR EDEMA. 90 tablet 0  ? losartan (COZAAR) 100 MG tablet TAKE 1 TABLET (100 MG TOTAL) DAILY. 90 tablet 0  ? Melatonin 5 MG TABS Take 10 mg by mouth at bedtime. 3 tabs    ? metoprolol succinate (TOPROL-XL) 25 MG 24 hr tablet TAKE 1 TABLET (25 MG TOTAL) BY MOUTH DAILY. 90 tablet 3  ? omeprazole (PRILOSEC) 40 MG capsule TAKE 1 CAPSULE EVERY DAY 90 capsule 3  ? vitamin B-12 (CYANOCOBALAMIN) 500 MCG  tablet Take 500 mcg by mouth daily.    ? XARELTO 20 MG TABS tablet TAKE 1 TABLET (20 MG TOTAL) BY MOUTH DAILY WITH SUPPER. 90 tablet 3  ? vitamin E 1000 UNIT capsule Take 1,000 Units by mouth daily. (Patient not taking: Reported on 10/04/2021)    ? Zoster Vaccine Adjuvanted [facility]) injection Inject 0.1m intramuscular once. Then repeat dose after 2 months. 0.5 mL 1  ? ?No current facility-administered medications for this visit.  ? ? ? ? ?REVIEW OF SYSTEMS (Negative unless checked) ? ?Constitutional: _0 Weight loss  _1 Fever  _2 Chills ?Cardiac: _3 Chest pain   _4 Chest pressure   _5 Palpitations   _6 Shortness of breath when laying flat   _7 Shortness of breath at rest   _8 Shortness of breath with exertion. ?Vascular:  _9 Pain in legs with walking   _10 Pain in legs at rest   _11 Pain in legs when laying flat   _12 Claudication   _13 Pain in feet when walking  _14 Pain in feet at rest  _15 Pain in feet when laying flat   _16 History of DVT   _17 Phlebitis   _18 Swelling in  legs   _19 Varicose veins   _20 Non-healing ulcers ?Pulmonary:   _21 Uses home oxygen   _22 Productive cough   _23 Hemoptysis   _24 Wheeze  _25 COPD   _26 Asthma ?Neurologic:  _27 Dizziness  _28 Blackouts   _29 Seizures   _30 History of stroke   _31 History of TIA  _32 Aphasia   _33 Temporary blindness   _34 Dysphagia   _35 Weakness or numbness in arms   _36 Weakness or numbness in legs ?Musculoskeletal:  _37 Arthritis   _38 Joint swelling   _39 Joint pain   _40 Low back pain ?Hematologic:  _41 Easy bruising  _42 Easy bleeding   _43 Hypercoagulable state   _44 Anemic  _45 Hepatitis ?Gastrointestinal:  _46 Blood in stool   _47 Vomiting blood  _48 Gastroesophageal reflux/heartburn   _49 Abdominal pain ?Genitourinary:  _50 Chronic kidney disease   _51 Difficult urination  _52 Frequent urination  _53 Burning with urination   _54 Hematuria ?Skin:  _55 Rashes   _56 Ulcers   _57 Wounds ?Psychological:  _58 History of anxiety   _59  History of major depression. ? ? ? ?Physical Exam ?BP 98/62 (BP Location: Left Arm)   Pulse 72   Resp 17   Ht _60  (1.905 m)   Wt 253 lb (114.8 kg)   BMI 31.62 kg/m?  ?Gen:  WD/WN, NAD. Debilitated appearing ?Head: [place]/AT, No temporalis wasting.  ?Ear/Nose/Throat: Hearing grossly intact, nares w/o erythema or drainage, oropharynx w/o Erythema/Exudate ?Eyes: Conjunctiva clear, sclera non-icteric  ?Neck: trachea midline.  No JVD.  ?Pulmonary:  Good air movement, respirations not labored, no use of accessory muscles  ?Cardiac: RRR, no JVD ?Vascular:  ?Vessel Right Left  ?Radial Palpable Palpable  ?    ?    ?    ?    ?    ?    ?DP 1+ 1+  ?PT NP NP  ? ?Gastrointestinal:. No masses, surgical incisions, or scars. ?Musculoskeletal: M/S 5/5 throughout.  Extremities without ischemic changes.  No deformity or atrophy. In a wheelchair. 2+ BLE edema. ?Neurologic: Sensation grossly intact in extremities.  Symmetrical.  Speech is fluent. Motor exam as listed above. ?Psychiatric: Judgment intact, Mood & affect appropriate for pt's clinical situation. ?Dermatologic: No rashes or  ulcers noted.  No cellulitis or open wounds. ? ? ? ?Radiology ?No results found. ? ?Labs ?Recent Results (from the past 2160 hour(s))  ?COMPLETE METABOLIC PANEL WITH GFR     Status: Abnormal  ? Collection Time: 08/10/21 11:32 AM  ?Result Value Ref Range  ? Glucose, Bld 96 65 -  99 mg/dL  ?  Comment: . ?           Fasting reference interval ?. ?  ? BUN 11 7 - 25 mg/dL  ? Creat 0.67 (L) 0.70 - 1.28 mg/dL  ? eGFR 95 > OR = 60 mL/min/1.57m  ?  Comment: The eGFR is based on the CKD-EPI 2021 equation. To calculate  ?the new eGFR from a previous Creatinine or Cystatin C ?result, go to https://www.kidney.org/professionals/ ?kdoqi/gfr%5Fcalculator ?  ? BUN/Creatinine Ratio 16 6 - 22 (calc)  ? Sodium 140 135 - 146 mmol/L  ? Potassium 4.5 3.5 - 5.3 mmol/L  ? Chloride 99 98 - 110 mmol/L  ? CO2 36 (H) 20 - 32 mmol/L  ? Calcium 9.4 8.6 - 10.3 mg/dL  ? Total Protein 6.2 6.1 - 8.1 g/dL  ? Albumin 3.8 3.6 - 5.1 g/dL  ? Globulin 2.4 1.9 - 3.7 g/dL (calc)  ? AG Ratio 1.6 1.0 - 2.5 (calc)  ? Total Bilirubin 1.0 0.2 - 1.2 mg/dL  ? Alkaline phosphatase (APISO) 58 35 - 144 U/L  ? AST 20 10 - 35 U/L  ? ALT 9 9 - 46 U/L  ?Lipid panel     Status: None  ? Collection Time: 08/10/21 11:32 AM  ?Result Value Ref Range  ? Cholesterol 126 <200 mg/dL  ? HDL 68 > OR = 40 mg/dL  ? Triglycerides 146 <150 mg/dL  ? LDL Cholesterol (Calc) 35 mg/dL (calc)  ?  Comment: Reference range: <100 ?.Marland Kitchen?Desirable range <100 mg/dL for primary prevention;   ?<70 mg/dL for patients with CHD or diabetic patients  ?with > or = 2 CHD risk factors. ?. ?LDL-C is now calculated using the Martin-Hopkins  ?calculation, which is a validated novel method providing  ?better accuracy than the Friedewald equation in the  ?estimation of LDL-C.  ?MCresenciano Genreet al. JAnnamaria Helling 20093;818(29: 2061-2068  ?(http://education.[employer].com/faq/FAQ164) ?  ? Total CHOL/HDL Ratio 1.9 <5.0 (calc)  ? Non-HDL Cholesterol (Calc) 58 <130 mg/dL (calc)  ?  Comment: For patients with diabetes plus 1  major ASCVD risk  ?factor, treating to a non-HDL-C goal of <100 mg/dL  ?(LDL-C of <70 mg/dL) is considered a therapeutic  ?option. ?  ?CBC with Differential/Platelet     Status: Abnormal  ? Collection Time: 01

## 2021-10-05 NOTE — Assessment & Plan Note (Signed)

## 2021-10-05 NOTE — Assessment & Plan Note (Signed)
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

## 2021-10-05 NOTE — Assessment & Plan Note (Signed)
blood pressure control important in reducing the progression of atherosclerotic disease. On appropriate oral medications.  

## 2021-10-05 NOTE — Assessment & Plan Note (Signed)
lipid control important in reducing the progression of atherosclerotic disease. Continue statin therapy  

## 2021-11-22 ENCOUNTER — Encounter (INDEPENDENT_AMBULATORY_CARE_PROVIDER_SITE_OTHER): Payer: Self-pay | Admitting: Vascular Surgery

## 2021-11-22 ENCOUNTER — Ambulatory Visit (INDEPENDENT_AMBULATORY_CARE_PROVIDER_SITE_OTHER): Payer: Medicare PPO

## 2021-11-22 ENCOUNTER — Ambulatory Visit (INDEPENDENT_AMBULATORY_CARE_PROVIDER_SITE_OTHER): Payer: Medicare PPO | Admitting: Vascular Surgery

## 2021-11-22 VITALS — BP 112/74 | HR 91 | Resp 16

## 2021-11-22 DIAGNOSIS — I2699 Other pulmonary embolism without acute cor pulmonale: Secondary | ICD-10-CM

## 2021-11-22 DIAGNOSIS — I10 Essential (primary) hypertension: Secondary | ICD-10-CM

## 2021-11-22 DIAGNOSIS — E782 Mixed hyperlipidemia: Secondary | ICD-10-CM | POA: Diagnosis not present

## 2021-11-22 DIAGNOSIS — I890 Lymphedema, not elsewhere classified: Secondary | ICD-10-CM

## 2021-11-22 NOTE — Progress Notes (Signed)
? ? ?MRN : [id number] ? ?Craig [name] is a 81 y.o. (15-Jan-1941) male who presents with chief complaint of  ?Chief Complaint  ?Patient presents with  ? Follow-up  ?  Ultrasound follow up  ?. ? ?History of Present Illness: Patient returns today in follow up of his leg swelling.  This is really not changed much since his last visit despite compression and elevation.  He still remains very inactive.  Venous reflux study today demonstrates no evidence of deep venous thrombosis, superficial thrombophlebitis, or significant venous reflux in either lower extremity. ? ?Current Outpatient Medications  ?Medication Sig Dispense Refill  ? amLODipine (NORVASC) 5 MG tablet TAKE 1 TABLET (5 MG TOTAL) BY MOUTH DAILY. 90 tablet 3  ? atorvastatin (LIPITOR) 40 MG tablet TAKE 1 TABLET (40 MG TOTAL) BY MOUTH DAILY. 90 tablet 3  ? cholecalciferol (VITAMIN D) 1000 UNITS tablet Take 1,000 Units by mouth daily.    ? escitalopram (LEXAPRO) 20 MG tablet TAKE 1 TABLET EVERY DAY 90 tablet 1  ? fluticasone (FLONASE) 50 MCG/ACT nasal spray Place 2 sprays into both nostrils daily. Use for 4-6 weeks then stop and use seasonally or as needed. 16 g 3  ? furosemide (LASIX) 40 MG tablet TAKE 1 TABLET (40 MG TOTAL) BY MOUTH DAILY AS NEEDED FOR FLUID OR EDEMA. 90 tablet 0  ? losartan (COZAAR) 100 MG tablet TAKE 1 TABLET (100 MG TOTAL) DAILY. 90 tablet 0  ? Melatonin 5 MG TABS Take 10 mg by mouth at bedtime. 3 tabs    ? metoprolol succinate (TOPROL-XL) 25 MG 24 hr tablet TAKE 1 TABLET (25 MG TOTAL) BY MOUTH DAILY. 90 tablet 3  ? omeprazole (PRILOSEC) 40 MG capsule TAKE 1 CAPSULE EVERY DAY 90 capsule 3  ? vitamin B-12 (CYANOCOBALAMIN) 500 MCG tablet Take 500 mcg by mouth daily.    ? XARELTO 20 MG TABS tablet TAKE 1 TABLET (20 MG TOTAL) BY MOUTH DAILY WITH SUPPER. 90 tablet 3  ? Zoster Vaccine Adjuvanted [facility]) injection Inject 0.81mL intramuscular once. Then repeat dose after 2 months. 0.5 mL 1  ? vitamin E 1000 UNIT capsule Take 1,000 Units by mouth  daily. (Patient not taking: Reported on 10/04/2021)    ? ?No current facility-administered medications for this visit.  ? ? ?Past Medical History:  ?Diagnosis Date  ? Anxiety   ? Essential hypertension   ? Pulmonary emboli ([facility])   ? Stroke [facility])   ? 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 ? ?Past Surgical History:  ?Procedure Laterality Date  ? LEFT HEART CATHETERIZATION WITH CORONARY ANGIOGRAM Bilateral 07/27/2014  ? Procedure: LEFT HEART CATHETERIZATION WITH CORONARY ANGIOGRAM;  Surgeon: Runell Gess, MD;  Location: [facility] CATH LAB;  Service: Cardiovascular;  Laterality: Bilateral;  ? SPLENECTOMY, TOTAL    ? TTP  ? ? ? ?Social History  ? ?Tobacco Use  ? Smoking status: Former  ?  Years: 20.00  ?  Types: Cigarettes  ?  Quit date: 07/24/1978  ?  Years since quitting: 43.3  ? Smokeless tobacco: Former  ? Tobacco comments:  ?  Quit tobacco in 1980  ?Vaping Use  ? Vaping Use: Never used  ?Substance Use Topics  ? Alcohol use: Yes  ?  Alcohol/week: 3.0 standard drinks  ?  Types: 3 Shots of liquor per week  ?  Comment: daily  ? Drug use: No  ? ? ? ? ?Family History  ?Problem Relation Age of Onset  ? CAD Father 20  ? Kidney failure  Mother   ? CAD Brother   ? ? ? ?Allergies  ?Allergen Reactions  ? Trazodone And Nefazodone Itching and Swelling  ? ? ?REVIEW OF SYSTEMS (Negative unless checked) ?  ?Constitutional: [] Weight loss  [] Fever  [] Chills ?Cardiac: [] Chest pain   [] Chest pressure   [] Palpitations   [] Shortness of breath when laying flat   [] Shortness of breath at rest   [] Shortness of breath with exertion. ?Vascular:  [] Pain in legs with walking   [] Pain in legs at rest   [] Pain in legs when laying flat   [] Claudication   [] Pain in feet when walking  [] Pain in feet at rest  [] Pain in feet when laying flat   [x] History of DVT   [] Phlebitis   [x] Swelling in legs   [] Varicose veins   [] Non-healing ulcers ?Pulmonary:   [] Uses home oxygen   [] Productive cough   [] Hemoptysis   [] Wheeze  [] COPD   [] Asthma ?Neurologic:  [] Dizziness  [] Blackouts    [] Seizures   [x] History of stroke   [] History of TIA  [] Aphasia   [] Temporary blindness   [] Dysphagia   [] Weakness or numbness in arms   [x] Weakness or numbness in legs ?Musculoskeletal:  [x] Arthritis   [] Joint swelling   [x] Joint pain   [] Low back pain ?Hematologic:  [] Easy bruising  [] Easy bleeding   [] Hypercoagulable state   [] Anemic  [] Hepatitis ?Gastrointestinal:  [] Blood in stool   [] Vomiting blood  [] Gastroesophageal reflux/heartburn   [] Abdominal pain ?Genitourinary:  [] Chronic kidney disease   [] Difficult urination  [] Frequent urination  [] Burning with urination   [] Hematuria ?Skin:  [] Rashes   [] Ulcers   [] Wounds ?Psychological:  [] History of anxiety   []  History of major depression. ? ?Physical Examination ? ?BP 112/74 (BP Location: Right Arm)   Pulse 91   Resp 16  ?Gen:  WD/WN, NAD ?Head: [place]/AT, No temporalis wasting. ?Ear/Nose/Throat: Hearing grossly intact, nares w/o erythema or drainage ?Eyes: Conjunctiva clear. Sclera non-icteric ?Neck: Supple.  Trachea midline ?Pulmonary:  Good air movement, no use of accessory muscles.  ?Cardiac: RRR, no JVD ?Vascular:  ?Vessel Right Left  ?Radial Palpable Palpable  ?    ?    ? ?Musculoskeletal: M/S 5/5 throughout.  No deformity or atrophy.  In a wheelchair.  2+ bilateral lower extremity edema. ?Neurologic: Sensation grossly intact in extremities.  Symmetrical.  Speech is fluent.  ?Psychiatric: Judgment intact, Mood & affect appropriate for pt's clinical situation. ?Dermatologic: No rashes or ulcers noted.  No cellulitis or open wounds. ? ? ? ? ? ?Labs ?No results found for this or any previous visit (from the past 2160 hour(s)). ? ?Radiology ?No results found. ? ?Assessment/Plan ?HLD (hyperlipidemia) ?lipid control important in reducing the progression of atherosclerotic disease. Continue statin therapy ?  ?  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  ?Essential  hypertension ?blood pressure control important in reducing the progression of atherosclerotic disease. On appropriate oral medications. ? ?Lymphedema of both lower extremities ?The patient has at least stage II lymphedema with swelling refractory to compression and elevation. Venous reflux study today demonstrates no evidence of deep venous thrombosis, superficial thrombophlebitis, or significant venous reflux in either lower extremity.  Given this finding, a lymphedema pump would be an excellent adjuvant therapy to try to get his swelling under better control.  We will try to get that obtained in the near future at his convenience.  We will see him back in 6 months. ? ? ? ?  Festus Barren, MD ? ?11/23/2021 ?12:10 PM ? ? ? ?This note was created with Dragon medical transcription system.  Any errors from dictation are purely unintentional  ?

## 2021-11-23 NOTE — Assessment & Plan Note (Signed)
The patient has at least stage II lymphedema with swelling refractory to compression and elevation. Venous reflux study today demonstrates no evidence of deep venous thrombosis, superficial thrombophlebitis, or significant venous reflux in either lower extremity.  Given this finding, a lymphedema pump would be an excellent adjuvant therapy to try to get his swelling under better control.  We will try to get that obtained in the near future at his convenience.  We will see him back in 6 months. ?

## 2021-11-30 ENCOUNTER — Other Ambulatory Visit: Payer: Self-pay | Admitting: Family Medicine

## 2021-11-30 DIAGNOSIS — I10 Essential (primary) hypertension: Secondary | ICD-10-CM

## 2021-11-30 NOTE — Telephone Encounter (Signed)
Requested Prescriptions  ?Pending Prescriptions Disp Refills  ?? losartan (COZAAR) 100 MG tablet [Pharmacy Med Name: LOSARTAN POTASSIUM 100 MG Tablet] 90 tablet 0  ?  Sig: TAKE 1 TABLET EVERY DAY  ?  ? Cardiovascular:  Angiotensin Receptor Blockers Failed - 11/30/2021  3:16 AM  ?  ?  Failed - Cr in normal range and within 180 days  ?  Creat  ?Date Value Ref Range Status  ?08/10/2021 0.67 (L) 0.70 - 1.28 mg/dL Final  ?   ?  ?  Passed - K in normal range and within 180 days  ?  Potassium  ?Date Value Ref Range Status  ?08/10/2021 4.5 3.5 - 5.3 mmol/L Final  ?07/25/2014 3.4 (L) 3.5 - 5.1 mmol/L Final  ?   ?  ?  Passed - Patient is not pregnant  ?  ?  Passed - Last BP in normal range  ?  BP Readings from Last 1 Encounters:  ?11/22/21 112/74  ?   ?  ?  Passed - Valid encounter within last 6 months  ?  Recent Outpatient Visits   ?      ? 3 months ago Annual physical exam  ? [facility] Smitty Cords, DO  ? 6 months ago Generalized weakness  ? [facility], Netta Neat, DO  ? 10 months ago Lymphedema of both lower extremities  ? [facility] Althea Charon, Netta Neat, DO  ? 1 year ago Annual physical exam  ? [facility] Smitty Cords, DO  ? 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 [facility] Smitty Cords, DO  ?  ?  ?Future Appointments   ?        ? In 1 week Althea Charon Netta Neat, DO [facility]  ?  ? ?  ?  ?  ? ? ?

## 2021-12-08 ENCOUNTER — Encounter: Payer: Self-pay | Admitting: Family Medicine

## 2021-12-08 ENCOUNTER — Ambulatory Visit (INDEPENDENT_AMBULATORY_CARE_PROVIDER_SITE_OTHER): Payer: Medicare PPO

## 2021-12-08 ENCOUNTER — Ambulatory Visit: Payer: Medicare PPO | Admitting: Family Medicine

## 2021-12-08 VITALS — BP 138/64 | HR 81 | Ht 75.0 in | Wt 252.6 lb

## 2021-12-08 DIAGNOSIS — F331 Major depressive disorder, recurrent, moderate: Secondary | ICD-10-CM | POA: Diagnosis not present

## 2021-12-08 DIAGNOSIS — I890 Lymphedema, not elsewhere classified: Secondary | ICD-10-CM | POA: Diagnosis not present

## 2021-12-08 DIAGNOSIS — G3184 Mild cognitive impairment, so stated: Secondary | ICD-10-CM

## 2021-12-08 DIAGNOSIS — J3089 Other allergic rhinitis: Secondary | ICD-10-CM | POA: Diagnosis not present

## 2021-12-08 DIAGNOSIS — Z0000 Encounter for general adult medical examination without abnormal findings: Secondary | ICD-10-CM

## 2021-12-08 DIAGNOSIS — F5101 Primary insomnia: Secondary | ICD-10-CM

## 2021-12-08 DIAGNOSIS — H6983 Other specified disorders of Eustachian tube, bilateral: Secondary | ICD-10-CM

## 2021-12-08 MED ORDER — DONEPEZIL HCL 5 MG PO TABS: 5.0000 mg | ORAL_TABLET | Freq: Every day | ORAL | 2 refills | Status: DC

## 2021-12-08 MED ORDER — FLUTICASONE PROPIONATE 50 MCG/ACT NA SUSP: 2.0000 | Freq: Every day | NASAL | 3 refills | Status: DC

## 2021-12-08 NOTE — Assessment & Plan Note (Signed)
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 He declines. He does ask about CPAP and sleep study, declines to pursue.

## 2021-12-08 NOTE — Patient Instructions (Addendum)
Thank you for coming to the office today.  Re ordered [facility]  Referral sent  [facility] [id number]  [place], [place] [address] Ph: [phone]  Start Donepezil (Aricept) 5mg  nightly for memory / cognitive. Let me know if doing well or if need dose 10mg  or other options.  Keep up with Lymphedema  Please schedule a Follow-up Appointment to: Return in about 4 months (around 04/10/2022) for 4 month follow-up 6CIT/MMSE Cognitive med, Lymphedema.  If you have any other questions or concerns, please feel free to call the office or send a message through [place]. You may also schedule an earlier appointment if necessary.  Additionally, you may be receiving a survey about your experience at our office within a few days to 1 week by e-mail or mail. We value your feedback.  Nobie Putnam, DO [place]

## 2021-12-08 NOTE — Progress Notes (Signed)
Subjective:   Craig [name] is a 81 y.o. male who presents for Medicare Annual/Subsequent preventive examination.  Review of Systems  Per HPI unless specifically indicated below        Objective:     Wt Readings from Last 3 Encounters:  12/08/21 252 lb 9.6 oz (114.6 kg)  10/04/21 253 lb (114.8 kg)  08/10/21 253 lb 6.4 oz (114.9 kg)   Temp Readings from Last 3 Encounters:  04/06/21 98.1 F (36.7 C) (Oral)  07/19/20 (!) 97.5 F (36.4 C) (Temporal)  01/16/20 (!) 97.5 F (36.4 C) (Temporal)   BP Readings from Last 3 Encounters:  12/08/21 138/64  11/22/21 112/74  10/04/21 98/62   Pulse Readings from Last 3 Encounters:  12/08/21 81  11/22/21 91  10/04/21 72        05/08/2017    9:14 AM 08/05/2015   11:05 AM 07/20/2015    3:14 PM 07/25/2014    5:02 PM  Advanced Directives  Does Patient Have a Medical Advance Directive? Yes Yes Yes Yes  Type of Paramedic of [place];Living will Living will;Healthcare Power of Attorney  Living will  Does patient want to make changes to medical advance directive?    No - Patient declined  Copy of [place] in Chart? No - copy requested Yes  No - copy requested    Current Medications (verified) Outpatient Encounter Medications as of 12/08/2021  Medication Sig   amLODipine (NORVASC) 5 MG tablet TAKE 1 TABLET (5 MG TOTAL) BY MOUTH DAILY.   atorvastatin (LIPITOR) 40 MG tablet TAKE 1 TABLET (40 MG TOTAL) BY MOUTH DAILY.   cholecalciferol (VITAMIN D) 1000 UNITS tablet Take 1,000 Units by mouth daily.   donepezil (ARICEPT) 5 MG tablet Take 1 tablet (5 mg total) by mouth at bedtime.   escitalopram (LEXAPRO) 20 MG tablet TAKE 1 TABLET EVERY DAY   fluticasone (FLONASE) 50 MCG/ACT nasal spray Place 2 sprays into both nostrils daily. Use for 4-6 weeks then stop and use seasonally or as needed.   furosemide (LASIX) 40 MG tablet TAKE 1 TABLET (40 MG TOTAL) BY MOUTH DAILY AS NEEDED FOR FLUID OR EDEMA.    losartan (COZAAR) 100 MG tablet TAKE 1 TABLET EVERY DAY   Melatonin 5 MG TABS Take 10 mg by mouth at bedtime. 3 tabs   metoprolol succinate (TOPROL-XL) 25 MG 24 hr tablet TAKE 1 TABLET (25 MG TOTAL) BY MOUTH DAILY.   omeprazole (PRILOSEC) 40 MG capsule TAKE 1 CAPSULE EVERY DAY   vitamin B-12 (CYANOCOBALAMIN) 500 MCG tablet Take 500 mcg by mouth daily.   vitamin E 1000 UNIT capsule Take 1,000 Units by mouth daily.   XARELTO 20 MG TABS tablet TAKE 1 TABLET (20 MG TOTAL) BY MOUTH DAILY WITH SUPPER.   Zoster Vaccine Adjuvanted [facility]) injection Inject 0.97mL intramuscular once. Then repeat dose after 2 months. (Patient not taking: Reported on 12/08/2021)   No facility-administered encounter medications on file as of 12/08/2021.    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[facility])    Past Surgical History:  Procedure Laterality Date   LEFT HEART CATHETERIZATION WITH CORONARY ANGIOGRAM Bilateral 07/27/2014   Procedure: LEFT HEART CATHETERIZATION WITH CORONARY ANGIOGRAM;  Surgeon: Lorretta Harp, MD;  Location[facility] CATH LAB;  Service: Cardiovascular;  Laterality: Bilateral;  SPLENECTOMY, TOTAL     TTP   Family History  Problem Relation Age of Onset   CAD Father 37   Kidney failure Mother    CAD Brother    Social History   Socioeconomic History   Marital status: Single    Spouse name: Not on file   Number of children: Not on file   Years of education: Not on file   Highest education level: Not on file  Occupational History   Occupation: Retired Physiological scientist)  Tobacco Use   Smoking status: Former    Years: 20.00    Types: Cigarettes    Quit date: 07/24/1978    Years since quitting: 43.4   Smokeless tobacco: Former   Tobacco comments:    Quit tobacco in [place] Use   Vaping Use: Never used  Substance and  Sexual Activity   Alcohol use: Yes    Alcohol/week: 3.0 standard drinks    Types: 3 Shots of liquor per week    Comment: daily   Drug use: No   Sexual activity: Yes    Birth control/protection: None  Other Topics Concern   Not on file  Social History Narrative   Retired from [employer]. Retired x 20 years.  Two children.     Social Determinants of Health   Financial Resource Strain: Low Risk    Difficulty of Paying Living Expenses: Not hard at all  Food Insecurity: No Food Insecurity   Worried About Charity fundraiser in the Last Year: Never true   [place] in the Last Year: Never true  Transportation Needs: No Transportation Needs   Lack of Transportation (Medical): No   Lack of Transportation (Non-Medical): No  Physical Activity: Inactive   Days of Exercise per Week: 0 days   Minutes of Exercise per Session: 0 min  Stress: Stress Concern Present   Feeling of Stress : Very much  Social Connections: Socially Integrated   Frequency of Communication with Friends and Family: Twice a week   Frequency of Social Gatherings with Friends and Family: Once a week   Attends Religious Services: 1 to 4 times per year   Active Member of [employer] or Organizations: No   Attends Music therapist: 1 to 4 times per year   Marital Status: Married    Tobacco Counseling Counseling given: Not Answered Tobacco comments: Quit tobacco in 1980   Clinical Intake:  Pre-visit preparation completed: No  Pain : 0-10 Pain Score: 0-No pain     Nutritional Risks: None  How often do you need to have someone help you when you read instructions, pamphlets, or other written materials from your doctor or pharmacy?: 1 - Never  Diabetic?No   Interpreter Needed?: No  Information entered by :: Donnie Mesa, CMA   Activities of Daily Living    12/08/2021   11:55 AM  In your present state of health, do you have any difficulty performing the following activities:   Hearing? 1  Vision? 1  Difficulty concentrating or making decisions? 0  Walking or climbing stairs? 1  Dressing or bathing? 0  Doing errands, shopping? 0    Patient Care Team: Olin Hauser, DO as PCP - General (Family Medicine)  Indicate any recent Medical Services you may have received from other than [facility] providers in the past year (date may be approximate).    Assessment:   This is a routine wellness examination for [place].  Hearing/Vision  screen No results found.  Dietary issues and exercise activities discussed: Current Exercise Habits: The patient does not participate in regular exercise at present, Exercise limited by: orthopedic condition(s);cardiac condition(s)   Goals Addressed             This Visit's Progress    DIET - INCREASE WATER INTAKE        Depression Screen    12/08/2021   12:09 PM 08/10/2021   10:39 AM 01/20/2021   11:11 AM 07/19/2020   11:34 AM 01/16/2020   11:15 AM 07/09/2019    1:27 AM 01/14/2019    1:43 PM  PHQ 2/9 Scores  PHQ - 2 Score 1 6 1 4 6 5 5   PHQ- 9 Score  18 4 13 17 13 13     Fall Risk    12/08/2021   11:48 AM 08/10/2021   10:38 AM 01/20/2021   11:10 AM 07/19/2020   11:25 AM 07/08/2019    1:56 PM  Fall Risk   Falls in the past year? 1 0 0 0 1  Number falls in past yr: 1 0 0 0 0  Injury with Fall? 1 0 0 0 0  Risk for fall due to : History of fall(s) Impaired mobility     Follow up Falls evaluation completed Falls evaluation completed Falls evaluation completed Falls evaluation completed Falls evaluation completed    [place]:  Any stairs in or around the home? Yes If so, are there any without handrails? No  Home free of loose throw rugs in walkways, pet beds, electrical cords, etc? Yes  Adequate lighting in your home to reduce risk of falls? Yes   ASSISTIVE DEVICES UTILIZED TO PREVENT FALLS:  Life alert? Yes  Use of a cane, walker or w/c? Yes  Grab bars in the bathroom?  Yes  Shower chair or bench in shower? Yes  Elevated toilet seat or a handicapped toilet? Yes   TIMED UP AND GO:  Was the test performed? No .  Length of time to ambulate 10 feet: N/A sec.     Cognitive Function:        12/08/2021   11:55 AM 05/08/2017    9:46 AM  6CIT Screen  What Year? 0 points 0 points  What month? 0 points 0 points  What time? 0 points 0 points  Count back from 20 0 points 0 points  Months in reverse 0 points 0 points  Repeat phrase 0 points 0 points  Total Score 0 points 0 points    Immunizations Immunization History  Administered Date(s) Administered   Influenza Split 06/02/2014   Influenza Whole 06/02/2014   Influenza, High Dose Seasonal PF 04/27/2015, 05/11/2016, 05/08/2017, 04/19/2018, 05/14/2019, 04/12/2020   Influenza-Unspecified 05/06/2012, 04/27/2015, 04/19/2018   PFIZER(Purple Top)SARS-COV-2 Vaccination 08/04/2019, 08/25/2019, 04/12/2020   Pneumococcal Conjugate-13 03/09/2016   Pneumococcal Polysaccharide-23 06/02/2014   Pneumococcal-Unspecified 06/02/2014   Tdap 03/09/2016   Zoster Recombinat (Shingrix) 06/05/2018, 08/07/2018    TDAP status: Up to date  Flu Vaccine status: Up to date  Pneumococcal vaccine status: Up to date  Covid-19 vaccine status: Completed vaccines  Qualifies for Shingles Vaccine? Yes   Zostavax completed Yes   Shingrix Completed?: Yes  Screening Tests Health Maintenance  Topic Date Due   Hepatitis C Screening  Never done   COVID-19 Vaccine (4 - Booster for Pfizer series) 06/07/2020   INFLUENZA VACCINE  02/21/2022   TETANUS/TDAP  03/09/2026   Pneumonia Vaccine 31+ Years old  Completed   Zoster Vaccines- Shingrix  Completed   HPV VACCINES  Aged Out    Health Maintenance  Health Maintenance Due  Topic Date Due   Hepatitis C Screening  Never done   COVID-19 Vaccine (4 - Booster for Pfizer series) 06/07/2020    Colonscopy:   Lung Cancer Screening: (Low Dose CT Chest recommended if Age 85-80  years, 30 pack-year currently smoking OR have quit w/in 15years.) does not qualify.   Lung Cancer Screening Referral: does not qualify   Additional Screening:  Hepatitis C Screening: does qualify  Vision Screening: Recommended annual ophthalmology exams for early detection of glaucoma and other disorders of the eye. Is the patient up to date with their annual eye exam?  Yes  Who is the provider or what is the name of the office in which the patient attends annual eye exams?  If pt is not established with a provider, would they like to be referred to a provider to establish care? No .   Dental Screening: Recommended annual dental exams for proper oral hygiene  Community Resource Referral / Chronic Care Management: CRR required this visit?  No   CCM required this visit?  No      Plan:     I have personally reviewed and noted the following in the patient's chart:   Medical and social history Use of alcohol, tobacco or illicit drugs  Current medications and supplements including opioid prescriptions. Patient is not currently taking opioid prescriptions. Functional ability and status Nutritional status Physical activity Advanced directives List of other physicians Hospitalizations, surgeries, and ER visits in previous 12 months Vitals Screenings to include cognitive, depression, and falls Referrals and appointments  In addition, I have reviewed and discussed with patient certain preventive protocols, quality metrics, and best practice recommendations. A written personalized care plan for preventive services as well as general preventive health recommendations were provided to patient.     Wilson Singer, [place]   12/08/2021

## 2021-12-08 NOTE — Patient Instructions (Signed)
Fall Prevention in the Home, Adult Falls can cause injuries and can happen to people of all ages. There are many things you can do to make your home safe and to help prevent falls. Ask for help when making these changes. What actions can I take to prevent falls? General Instructions Use good lighting in all rooms. Replace any light bulbs that burn out. Turn on the lights in dark areas. Use night-lights. Keep items that you use often in easy-to-reach places. Lower the shelves around your home if needed. Set up your furniture so you have a clear path. Avoid moving your furniture around. Do not have throw rugs or other things on the floor that can make you trip. Avoid walking on wet floors. If any of your floors are uneven, fix them. Add color or contrast paint or tape to clearly mark and help you see: Grab bars or handrails. First and last steps of staircases. Where the edge of each step is. If you use a stepladder: Make sure that it is fully opened. Do not climb a closed stepladder. Make sure the sides of the stepladder are locked in place. Ask someone to hold the stepladder while you use it. Know where your pets are when moving through your home. What can I do in the bathroom?     Keep the floor dry. Clean up any water on the floor right away. Remove soap buildup in the tub or shower. Use nonskid mats or decals on the floor of the tub or shower. Attach bath mats securely with double-sided, nonslip rug tape. If you need to sit down in the shower, use a plastic, nonslip stool. Install grab bars by the toilet and in the tub and shower. Do not use towel bars as grab bars. What can I do in the bedroom? Make sure that you have a light by your bed that is easy to reach. Do not use any sheets or blankets for your bed that hang to the floor. Have a firm chair with side arms that you can use for support when you get dressed. What can I do in the kitchen? Clean up any spills right away. If  you need to reach something above you, use a step stool with a grab bar. Keep electrical cords out of the way. Do not use floor polish or wax that makes floors slippery. What can I do with my stairs? Do not leave any items on the stairs. Make sure that you have a light switch at the top and the bottom of the stairs. Make sure that there are handrails on both sides of the stairs. Fix handrails that are broken or loose. Install nonslip stair treads on all your stairs. Avoid having throw rugs at the top or bottom of the stairs. Choose a carpet that does not hide the edge of the steps on the stairs. Check carpeting to make sure that it is firmly attached to the stairs. Fix carpet that is loose or worn. What can I do on the outside of my home? Use bright outdoor lighting. Fix the edges of walkways and driveways and fix any cracks. Remove anything that might make you trip as you walk through a door, such as a raised step or threshold. Trim any bushes or trees on paths to your home. Check to see if handrails are loose or broken and that both sides of all steps have handrails. Install guardrails along the edges of any raised decks and porches. Clear paths of anything   that can make you trip, such as tools or rocks. Have leaves, snow, or ice cleared regularly. Use sand or salt on paths during winter. Clean up any spills in your garage right away. This includes grease or oil spills. What other actions can I take? Wear shoes that: Have a low heel. Do not wear high heels. Have rubber bottoms. Feel good on your feet and fit well. Are closed at the toe. Do not wear open-toe sandals. Use tools that help you move around if needed. These include: Canes. Walkers. Scooters. Crutches. Review your medicines with your doctor. Some medicines can make you feel dizzy. This can increase your chance of falling. Ask your doctor what else you can do to help prevent falls. Where to find more information Centers  for Disease Control and Prevention, [employer]: www.cdc.gov National Institute on Aging: www.nia.nih.gov Contact a doctor if: You are afraid of falling at home. You feel weak, drowsy, or dizzy at home. You fall at home. Summary There are many simple things that you can do to make your home safe and to help prevent falls. Ways to make your home safe include removing things that can make you trip and installing grab bars in the bathroom. Ask for help when making these changes in your home. This information is not intended to replace advice given to you by your health care provider. Make sure you discuss any questions you have with your health care provider. Document Revised: 04/11/2021 Document Reviewed: 02/11/2020 Elsevier Patient Education  2023 Elsevier Inc.  Health Maintenance, Male Adopting a healthy lifestyle and getting preventive care are important in promoting health and wellness. Ask your health care provider about: The right schedule for you to have regular tests and exams. Things you can do on your own to prevent diseases and keep yourself healthy. What should I know about diet, weight, and exercise? Eat a healthy diet  Eat a diet that includes plenty of vegetables, fruits, low-fat dairy products, and lean protein. Do not eat a lot of foods that are high in solid fats, added sugars, or sodium. Maintain a healthy weight Body mass index (BMI) is a measurement that can be used to identify possible weight problems. It estimates body fat based on height and weight. Your health care provider can help determine your BMI and help you achieve or maintain a healthy weight. Get regular exercise Get regular exercise. This is one of the most important things you can do for your health. Most adults should: Exercise for at least 150 minutes each week. The exercise should increase your heart rate and make you sweat (moderate-intensity exercise). Do strengthening exercises at least twice a week.  This is in addition to the moderate-intensity exercise. Spend less time sitting. Even light physical activity can be beneficial. Watch cholesterol and blood lipids Have your blood tested for lipids and cholesterol at 80 years of age, then have this test every 5 years. You may need to have your cholesterol levels checked more often if: Your lipid or cholesterol levels are high. You are older than 80 years of age. You are at high risk for heart disease. What should I know about cancer screening? Many types of cancers can be detected early and may often be prevented. Depending on your health history and family history, you may need to have cancer screening at various ages. This may include screening for: Colorectal cancer. Prostate cancer. Skin cancer. Lung cancer. What should I know about heart disease, diabetes, and high blood pressure? Blood   pressure and heart disease High blood pressure causes heart disease and increases the risk of stroke. This is more likely to develop in people who have high blood pressure readings or are overweight. Talk with your health care provider about your target blood pressure readings. Have your blood pressure checked: Every 3-5 years if you are 18-39 years of age. Every year if you are 40 years old or older. If you are between the ages of 65 and 75 and are a current or former smoker, ask your health care provider if you should have a one-time screening for abdominal aortic aneurysm (AAA). Diabetes Have regular diabetes screenings. This checks your fasting blood sugar level. Have the screening done: Once every three years after age 45 if you are at a normal weight and have a low risk for diabetes. More often and at a younger age if you are overweight or have a high risk for diabetes. What should I know about preventing infection? Hepatitis B If you have a higher risk for hepatitis B, you should be screened for this virus. Talk with your health care provider  to find out if you are at risk for hepatitis B infection. Hepatitis C Blood testing is recommended for: Everyone born from 1945 through 1965. Anyone with known risk factors for hepatitis C. Sexually transmitted infections (STIs) You should be screened each year for STIs, including gonorrhea and chlamydia, if: You are sexually active and are younger than 80 years of age. You are older than 80 years of age and your health care provider tells you that you are at risk for this type of infection. Your sexual activity has changed since you were last screened, and you are at increased risk for chlamydia or gonorrhea. Ask your health care provider if you are at risk. Ask your health care provider about whether you are at high risk for HIV. Your health care provider may recommend a prescription medicine to help prevent HIV infection. If you choose to take medicine to prevent HIV, you should first get tested for HIV. You should then be tested every 3 months for as long as you are taking the medicine. Follow these instructions at home: Alcohol use Do not drink alcohol if your health care provider tells you not to drink. If you drink alcohol: Limit how much you have to 0-2 drinks a day. Know how much alcohol is in your drink. In the [place] one drink equals one 12 oz bottle of beer (355 mL), one 5 oz glass of wine (148 mL), or one 1 oz glass of hard liquor (44 mL). Lifestyle Do not use any products that contain nicotine or tobacco. These products include cigarettes, chewing tobacco, and vaping devices, such as e-cigarettes. If you need help quitting, ask your health care provider. Do not use street drugs. Do not share needles. Ask your health care provider for help if you need support or information about quitting drugs. General instructions Schedule regular health, dental, and eye exams. Stay current with your vaccines. Tell your health care provider if: You often feel depressed. You have ever been  abused or do not feel safe at home. Summary Adopting a healthy lifestyle and getting preventive care are important in promoting health and wellness. Follow your health care provider's instructions about healthy diet, exercising, and getting tested or screened for diseases. Follow your health care provider's instructions on monitoring your cholesterol and blood pressure. This information is not intended to replace advice given to you by your health care   provider. Make sure you discuss any questions you have with your health care provider. Document Revised: 11/29/2020 Document Reviewed: 11/29/2020 Elsevier Patient Education  2023 Elsevier Inc.  

## 2021-12-08 NOTE — Assessment & Plan Note (Signed)
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

## 2021-12-08 NOTE — Progress Notes (Signed)
Subjective:    Patient ID: Craig [name], male    DOB: 11/02/1940, 81 y.o.   MRN: [id number]  Craig [name] is a 81 y.o. male presenting on 12/08/2021 for Hypertension  Patient to see Donnie Mesa CMA for AMW today as well  HPI  Follow up Lymphedema, lower extremities Generalized Weakness, lower extremities Recent history 04/2021, managed with diuretic and medication and lifestyle modification. He has seen [facility] in interval w Dr Lucky Cowboy Now he is waiting on Lymphedema Pump, per Vascular, already ordered and approved. Admits still with lymphedema today - optimistic about treatment with lymphedema pump  Mild Cognitive Impairment with Memory loss Wife reports increasing difficulty with episodes of confusion and some memory loss impacting him. Interested in treatment options  Eustachian Tube Dysfunction Reduced Hearing R >L Using Flonase but not improving >2 years.   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 Today has normal BP Current Meds - Amlodipine 533mdaily, Metoprolol XL 2529maily   Reports good compliance, took meds today. Tolerating well, w/o complaints.    Recurrent PE/DVT, on anticoagulation lifelong Reports no new problem. Continues to take Xarelto daily for anticoagulation      12/08/2021   12:09 PM 08/10/2021   10:39 AM 01/20/2021   11:11 AM  Depression screen PHQ 2/9  Decreased Interest 1 3 1   Down, Depressed, Hopeless 0 3 0  PHQ - 2 Score 1 6 1   Altered sleeping  3 1  Tired, decreased energy  3 1  Change in appetite  1 1  Feeling bad or failure about  yourself   1 0  Trouble concentrating  3 0  Moving slowly or fidgety/restless  1   Suicidal thoughts  0 0  PHQ-9 Score  18 4  Difficult doing work/chores  Extremely dIfficult Not difficult at all       08/10/2021   10:39 AM 01/20/2021   11:11 AM 01/16/2020   11:16 AM 12/27/2016   11:14 AM  GAD 7 : Generalized Anxiety Score  Nervous, Anxious, on Edge 2 0 3 0  Control/stop worrying 1 0 3 0  Worry too much - different things 1 0 3 1  Trouble relaxing 1 0 2 0  Restless 1 0 2 0  Easily annoyed or irritable 1 1 3  0  Afraid - awful might happen 1 0 2 0  Total GAD 7 Score 8 1 18 1   Anxiety Difficulty Somewhat difficult Not difficult at all Somewhat difficult Not difficult at all     Social History   Tobacco Use   Smoking status: Former    Years: 20.00    Types: Cigarettes    Quit date: 07/24/1978    Years since quitting: 43.4   Smokeless tobacco: Former   Tobacco comments:    Quit tobacco in 1980  Vaping Use   Vaping Use: Never used  Substance Use Topics   Alcohol use: Yes    Alcohol/week: 3.0 standard drinks    Types: 3 Shots of liquor per week    Comment: daily   Drug use: No    Review of Systems Per  HPI unless specifically indicated above     Objective:    BP 138/64   Pulse 81   Ht 6' 3"  (1.905 m)   Wt 252 lb 9.6 oz (114.6 kg)   SpO2 90%   BMI 31.57 kg/m   Wt Readings from Last 3 Encounters:  12/08/21 252 lb 9.6 oz (114.6 kg)  10/04/21 253 lb (114.8 kg)  08/10/21 253 lb 6.4 oz (114.9 kg)    Physical Exam Vitals and nursing note reviewed.  Constitutional:      General: He is not in acute distress.    Appearance: He is well-developed. He is not diaphoretic.     Comments: Well-appearing, comfortable, cooperative  HENT:     Head: Normocephalic and atraumatic.     Ears:     Comments: Effusion R>L Eyes:     General:        Right eye: No discharge.        Left eye: No discharge.     Conjunctiva/sclera: Conjunctivae normal.     Pupils: Pupils are equal,  round, and reactive to light.  Neck:     Thyroid: No thyromegaly.  Cardiovascular:     Rate and Rhythm: Normal rate and regular rhythm.     Pulses: Normal pulses.     Heart sounds: Normal heart sounds. No murmur heard. Pulmonary:     Effort: Pulmonary effort is normal. No respiratory distress.     Breath sounds: Normal breath sounds. No wheezing or rales.  Abdominal:     General: Bowel sounds are normal. There is no distension.     Palpations: Abdomen is soft. There is no mass.     Tenderness: There is no abdominal tenderness.  Musculoskeletal:        General: No tenderness. Normal range of motion.     Cervical back: Normal range of motion and neck supple.     Right lower leg: Edema present.     Left lower leg: Edema present.     Comments: Upper / Lower Extremities: - Normal muscle tone, strength bilateral upper extremities 5/5, lower extremities 5/5  Lymphadenopathy:     Cervical: No cervical adenopathy.  Skin:    General: Skin is warm and dry.     Findings: No erythema or rash.  Neurological:     Mental Status: He is alert and oriented to person, place, and time.     Comments: Distal sensation intact to light touch all extremities  Psychiatric:        Mood and Affect: Mood normal.        Behavior: Behavior normal.        Thought Content: Thought content normal.     Comments: Well groomed, good eye contact, normal speech and thoughts      12/08/2021   11:55 AM 05/08/2017    9:46 AM  6CIT Screen  What Year? 0 points 0 points  What month? 0 points 0 points  What time? 0 points 0 points  Count back from 20 0 points 0 points  Months in reverse 0 points 0 points  Repeat phrase 0 points 0 points  Total Score 0 points 0 points      Results for orders placed or performed in visit on 08/10/21  COMPLETE METABOLIC PANEL WITH GFR  Result Value Ref Range   Glucose, Bld 96 65 - 99 mg/dL   BUN 11 7 - 25 mg/dL   Creat 0.67 (L) 0.70 - 1.28 mg/dL   eGFR 95 >  OR = 60  mL/min/1.2m   BUN/Creatinine Ratio 16 6 - 22 (calc)   Sodium 140 135 - 146 mmol/L   Potassium 4.5 3.5 - 5.3 mmol/L   Chloride 99 98 - 110 mmol/L   CO2 36 (H) 20 - 32 mmol/L   Calcium 9.4 8.6 - 10.3 mg/dL   Total Protein 6.2 6.1 - 8.1 g/dL   Albumin 3.8 3.6 - 5.1 g/dL   Globulin 2.4 1.9 - 3.7 g/dL (calc)   AG Ratio 1.6 1.0 - 2.5 (calc)   Total Bilirubin 1.0 0.2 - 1.2 mg/dL   Alkaline phosphatase (APISO) 58 35 - 144 U/L   AST 20 10 - 35 U/L   ALT 9 9 - 46 U/L  Lipid panel  Result Value Ref Range   Cholesterol 126 <200 mg/dL   HDL 68 > OR = 40 mg/dL   Triglycerides 146 <150 mg/dL   LDL Cholesterol (Calc) 35 mg/dL (calc)   Total CHOL/HDL Ratio 1.9 <5.0 (calc)   Non-HDL Cholesterol (Calc) 58 <130 mg/dL (calc)  CBC with Differential/Platelet  Result Value Ref Range   WBC 5.0 3.8 - 10.8 Thousand/uL   RBC 3.98 (L) 4.20 - 5.80 Million/uL   Hemoglobin 13.9 13.2 - 17.1 g/dL   HCT 42.4 38.5 - 50.0 %   MCV 106.5 (H) 80.0 - 100.0 fL   MCH 34.9 (H) 27.0 - 33.0 pg   MCHC 32.8 32.0 - 36.0 g/dL   RDW 12.8 11.0 - 15.0 %   Platelets 265 140 - 400 Thousand/uL   MPV 11.4 7.5 - 12.5 fL   Neutro Abs 2,635 1,500 - 7,800 cells/uL   Lymphs Abs 1,810 850 - 3,900 cells/uL   Absolute Monocytes 525 200 - 950 cells/uL   Eosinophils Absolute 10 (L) 15 - 500 cells/uL   Basophils Absolute 20 0 - 200 cells/uL   Neutrophils Relative % 52.7 %   Total Lymphocyte 36.2 %   Monocytes Relative 10.5 %   Eosinophils Relative 0.2 %   Basophils Relative 0.4 %  Hemoglobin A1c  Result Value Ref Range   Hgb A1c MFr Bld 5.5 <5.7 % of total Hgb   Mean Plasma Glucose 111 mg/dL   eAG (mmol/L) 6.2 mmol/L  PSA  Result Value Ref Range   PSA 0.64 < OR = 4.00 ng/mL      Assessment & Plan:   Problem List Items Addressed This Visit     Moderate recurrent major depression (HCC)    Recurrent depression, moderate, chronic - some improvement Secondary insomnia vs primary insomnia  Continue Lexapro 239mdaily -  discuss alternatives, limited options from me as he has failed many other therapy options He declines specialist psych or sleep       Lymphedema of both lower extremities - Primary    Followed by [facility] He is proceeding with Lymphedema pumps bilateral lower extremity       Insomnia    Improved on current meds - but wt gain 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 He declines. He does ask about CPAP and sleep study, declines to pursue.       Other Visit Diagnoses     Eustachian tube dysfunction, bilateral       Relevant Medications   fluticasone (FLONASE)  50 MCG/ACT nasal spray   Other Relevant Orders   Ambulatory referral to ENT   Seasonal allergic rhinitis due to other allergic trigger       Relevant Medications   fluticasone (FLONASE) 50 MCG/ACT nasal spray   Mild cognitive impairment with memory loss       Relevant Medications   donepezil (ARICEPT) 5 MG tablet       Mild Cognitive Impairment w Memory Loss Reviewed MRI Brain from 03/2021, some evidence of microvascular and chronic changes can contribute Reported episodes of confusion even in the house or familiar places Some recall and overall remains cognitively intact and functional - discussion today on options to preserve cognitive function / memory, we agreed to a trial of Aricept 75m daily, consider dose adjust if need. Follow-up, future consider Neuro consult if indicated  Meds ordered this encounter  Medications   fluticasone (FLONASE) 50 MCG/ACT nasal spray    Sig: Place 2 sprays into both nostrils daily. Use for 4-6 weeks then stop and use seasonally or as needed.    Dispense:  16 g    Refill:  3   donepezil (ARICEPT) 5 MG tablet    Sig: Take 1  tablet (5 mg total) by mouth at bedtime.    Dispense:  30 tablet    Refill:  2     Follow up plan: Return in about 4 months (around 04/10/2022) for 4 month follow-up 6CIT/MMSE Cognitive med, Lymphedema.   ANobie Putnam DO [facility] 12/08/2021, 11:11 AM

## 2021-12-08 NOTE — Assessment & Plan Note (Signed)
Recurrent depression, moderate, chronic - some improvement Secondary insomnia vs primary insomnia  Continue Lexapro 20mg  daily - discuss alternatives, limited options from me as he has failed many other therapy options He declines specialist psych or sleep

## 2021-12-15 ENCOUNTER — Emergency Department: Payer: Medicare PPO

## 2021-12-15 ENCOUNTER — Other Ambulatory Visit: Payer: Self-pay

## 2021-12-15 ENCOUNTER — Inpatient Hospital Stay
Admission: EM | Admit: 2021-12-15 | Discharge: 2021-12-18 | DRG: 432 | Disposition: A | Discharge status: Home / Self Care | Payer: Medicare PPO | Attending: Obstetrics and Gynecology | Admitting: Obstetrics and Gynecology

## 2021-12-15 DIAGNOSIS — J90 Pleural effusion, not elsewhere classified: Secondary | ICD-10-CM | POA: Diagnosis present

## 2021-12-15 DIAGNOSIS — Z8249 Family history of ischemic heart disease and other diseases of the circulatory system: Secondary | ICD-10-CM | POA: Diagnosis not present

## 2021-12-15 DIAGNOSIS — I890 Lymphedema, not elsewhere classified: Secondary | ICD-10-CM | POA: Diagnosis present

## 2021-12-15 DIAGNOSIS — J9601 Acute respiratory failure with hypoxia: Secondary | ICD-10-CM | POA: Diagnosis not present

## 2021-12-15 DIAGNOSIS — Y92019 Unspecified place in single-family (private) house as the place of occurrence of the external cause: Secondary | ICD-10-CM | POA: Diagnosis not present

## 2021-12-15 DIAGNOSIS — I2782 Chronic pulmonary embolism: Secondary | ICD-10-CM | POA: Diagnosis present

## 2021-12-15 DIAGNOSIS — Z86718 Personal history of other venous thrombosis and embolism: Secondary | ICD-10-CM | POA: Diagnosis not present

## 2021-12-15 DIAGNOSIS — R0902 Hypoxemia: Secondary | ICD-10-CM | POA: Diagnosis not present

## 2021-12-15 DIAGNOSIS — I2699 Other pulmonary embolism without acute cor pulmonale: Secondary | ICD-10-CM | POA: Diagnosis present

## 2021-12-15 DIAGNOSIS — R296 Repeated falls: Secondary | ICD-10-CM | POA: Diagnosis not present

## 2021-12-15 DIAGNOSIS — S0101XA Laceration without foreign body of scalp, initial encounter: Secondary | ICD-10-CM | POA: Diagnosis not present

## 2021-12-15 DIAGNOSIS — R188 Other ascites: Secondary | ICD-10-CM | POA: Diagnosis not present

## 2021-12-15 DIAGNOSIS — Z9181 History of falling: Secondary | ICD-10-CM | POA: Diagnosis not present

## 2021-12-15 DIAGNOSIS — F0390 Unspecified dementia without behavioral disturbance: Secondary | ICD-10-CM | POA: Diagnosis present

## 2021-12-15 DIAGNOSIS — I10 Essential (primary) hypertension: Secondary | ICD-10-CM | POA: Diagnosis present

## 2021-12-15 DIAGNOSIS — J9811 Atelectasis: Secondary | ICD-10-CM | POA: Diagnosis not present

## 2021-12-15 DIAGNOSIS — E876 Hypokalemia: Secondary | ICD-10-CM | POA: Diagnosis present

## 2021-12-15 DIAGNOSIS — M7989 Other specified soft tissue disorders: Secondary | ICD-10-CM | POA: Diagnosis not present

## 2021-12-15 DIAGNOSIS — Z888 Allergy status to other drugs, medicaments and biological substances status: Secondary | ICD-10-CM

## 2021-12-15 DIAGNOSIS — R601 Generalized edema: Secondary | ICD-10-CM | POA: Diagnosis present

## 2021-12-15 DIAGNOSIS — Z20822 Contact with and (suspected) exposure to covid-19: Secondary | ICD-10-CM | POA: Diagnosis present

## 2021-12-15 DIAGNOSIS — F1090 Alcohol use, unspecified, uncomplicated: Secondary | ICD-10-CM | POA: Diagnosis present

## 2021-12-15 DIAGNOSIS — S01112A Laceration without foreign body of left eyelid and periocular area, initial encounter: Secondary | ICD-10-CM | POA: Diagnosis present

## 2021-12-15 DIAGNOSIS — I482 Chronic atrial fibrillation, unspecified: Secondary | ICD-10-CM

## 2021-12-15 DIAGNOSIS — W06XXXA Fall from bed, initial encounter: Secondary | ICD-10-CM | POA: Diagnosis present

## 2021-12-15 DIAGNOSIS — Z79899 Other long term (current) drug therapy: Secondary | ICD-10-CM

## 2021-12-15 DIAGNOSIS — W19XXXA Unspecified fall, initial encounter: Principal | ICD-10-CM

## 2021-12-15 DIAGNOSIS — Z9889 Other specified postprocedural states: Secondary | ICD-10-CM

## 2021-12-15 DIAGNOSIS — S0590XA Unspecified injury of unspecified eye and orbit, initial encounter: Secondary | ICD-10-CM | POA: Diagnosis not present

## 2021-12-15 DIAGNOSIS — N200 Calculus of kidney: Secondary | ICD-10-CM | POA: Diagnosis not present

## 2021-12-15 DIAGNOSIS — K7031 Alcoholic cirrhosis of liver with ascites: Secondary | ICD-10-CM | POA: Diagnosis not present

## 2021-12-15 DIAGNOSIS — Z043 Encounter for examination and observation following other accident: Secondary | ICD-10-CM | POA: Diagnosis not present

## 2021-12-15 DIAGNOSIS — Z8673 Personal history of transient ischemic attack (TIA), and cerebral infarction without residual deficits: Secondary | ICD-10-CM | POA: Diagnosis not present

## 2021-12-15 DIAGNOSIS — Z7902 Long term (current) use of antithrombotics/antiplatelets: Secondary | ICD-10-CM

## 2021-12-15 DIAGNOSIS — R0602 Shortness of breath: Secondary | ICD-10-CM | POA: Diagnosis present

## 2021-12-15 DIAGNOSIS — F419 Anxiety disorder, unspecified: Secondary | ICD-10-CM | POA: Diagnosis present

## 2021-12-15 DIAGNOSIS — N281 Cyst of kidney, acquired: Secondary | ICD-10-CM | POA: Diagnosis not present

## 2021-12-15 DIAGNOSIS — K8020 Calculus of gallbladder without cholecystitis without obstruction: Secondary | ICD-10-CM | POA: Diagnosis not present

## 2021-12-15 DIAGNOSIS — R600 Localized edema: Secondary | ICD-10-CM | POA: Diagnosis not present

## 2021-12-15 DIAGNOSIS — R58 Hemorrhage, not elsewhere classified: Secondary | ICD-10-CM | POA: Diagnosis not present

## 2021-12-15 DIAGNOSIS — I5021 Acute systolic (congestive) heart failure: Secondary | ICD-10-CM | POA: Diagnosis not present

## 2021-12-15 LAB — BASIC METABOLIC PANEL
Anion gap: 5 (ref 5–15)
BUN: 15 mg/dL (ref 8–23)
CO2: 32 mmol/L (ref 22–32)
Calcium: 9.2 mg/dL (ref 8.9–10.3)
Chloride: 102 mmol/L (ref 98–111)
Creatinine, Ser: 0.93 mg/dL (ref 0.61–1.24)
GFR, Estimated: >60 mL/min (ref >60)
Glucose, Bld: 99 mg/dL (ref 70–99)
Potassium: 4.3 mmol/L (ref 3.5–5.1)
Sodium: 139 mmol/L (ref 135–145)

## 2021-12-15 LAB — CBC
HCT: 40.9 % (ref 39.0–52.0)
Hemoglobin: 13.6 g/dL (ref 13.0–17.0)
MCH: 30.9 pg (ref 26.0–34.0)
MCHC: 33.3 g/dL (ref 30.0–36.0)
MCV: 93 fL (ref 80.0–100.0)
Platelets: 226 10*3/uL (ref 150–400)
RBC: 4.4 MIL/uL (ref 4.22–5.81)
RDW: 22.2 % — ABNORMAL HIGH (ref 11.5–15.5)
WBC: 5.2 10*3/uL (ref 4.0–10.5)
nRBC: 0 % (ref 0.0–0.2)

## 2021-12-15 LAB — PROTIME-INR
INR: 2.2 — ABNORMAL HIGH (ref 0.8–1.2)
Prothrombin Time: 24.6 seconds — ABNORMAL HIGH (ref 11.4–15.2)

## 2021-12-15 LAB — APTT: aPTT: 38 seconds — ABNORMAL HIGH (ref 24–36)

## 2021-12-15 LAB — RESP PANEL BY RT-PCR (FLU A&B, COVID) ARPGX2
Influenza A by PCR: NEGATIVE
Influenza B by PCR: NEGATIVE
SARS Coronavirus 2 by RT PCR: NEGATIVE

## 2021-12-15 LAB — TROPONIN I (HIGH SENSITIVITY): Troponin I (High Sensitivity): 8 ng/L (ref <18)

## 2021-12-15 LAB — BRAIN NATRIURETIC PEPTIDE: B Natriuretic Peptide: 363.3 pg/mL — ABNORMAL HIGH (ref 0.0–100.0)

## 2021-12-15 MED ORDER — LOSARTAN POTASSIUM 50 MG PO TABS: 100.0000 mg | ORAL_TABLET | Freq: Every day | ORAL | Status: DC

## 2021-12-15 MED ORDER — FUROSEMIDE 10 MG/ML IJ SOLN
40.0000 mg | Freq: Once | INTRAMUSCULAR | Status: AC
Start: 2021-12-15 — End: 2021-12-15
  Administered 2021-12-15: 40 mg via INTRAVENOUS
  Filled 2021-12-15: qty 4

## 2021-12-15 MED ORDER — ATORVASTATIN CALCIUM 20 MG PO TABS
40.0000 mg | ORAL_TABLET | Freq: Every day | ORAL | Status: DC
  Administered 2021-12-16 – 2021-12-18 (×3): 40 mg via ORAL
  Filled 2021-12-15 (×3): qty 2

## 2021-12-15 MED ORDER — DONEPEZIL HCL 5 MG PO TABS
5.0000 mg | ORAL_TABLET | Freq: Every day | ORAL | Status: DC
  Administered 2021-12-15 – 2021-12-17 (×3): 5 mg via ORAL
  Filled 2021-12-15 (×4): qty 1

## 2021-12-15 MED ORDER — ESCITALOPRAM OXALATE 10 MG PO TABS
20.0000 mg | ORAL_TABLET | Freq: Every day | ORAL | Status: DC
  Administered 2021-12-16 – 2021-12-18 (×3): 20 mg via ORAL
  Filled 2021-12-15 (×3): qty 2

## 2021-12-15 MED ORDER — METOPROLOL SUCCINATE ER 50 MG PO TB24
25.0000 mg | ORAL_TABLET | Freq: Every day | ORAL | Status: DC
  Administered 2021-12-16 – 2021-12-18 (×3): 25 mg via ORAL
  Filled 2021-12-15 (×3): qty 1

## 2021-12-15 MED ORDER — FLUTICASONE PROPIONATE 50 MCG/ACT NA SUSP
2.0000 | Freq: Every day | NASAL | Status: DC
  Administered 2021-12-16 – 2021-12-17 (×2): 2 via NASAL
  Filled 2021-12-15: qty 16

## 2021-12-15 MED ORDER — IOHEXOL 350 MG/ML SOLN
100.0000 mL | Freq: Once | INTRAVENOUS | Status: AC | PRN
  Administered 2021-12-15: 100 mL via INTRAVENOUS

## 2021-12-15 MED ORDER — TEMAZEPAM 7.5 MG PO CAPS
7.5000 mg | ORAL_CAPSULE | Freq: Once | ORAL | Status: AC
  Administered 2021-12-15: 7.5 mg via ORAL
  Filled 2021-12-15: qty 1

## 2021-12-15 MED ORDER — RIVAROXABAN 20 MG PO TABS
20.0000 mg | ORAL_TABLET | Freq: Every day | ORAL | Status: DC
  Administered 2021-12-15: 20 mg via ORAL
  Filled 2021-12-15: qty 1

## 2021-12-15 MED ORDER — PANTOPRAZOLE SODIUM 40 MG IV SOLR
40.0000 mg | Freq: Two times a day (BID) | INTRAVENOUS | Status: DC
  Administered 2021-12-15 – 2021-12-18 (×6): 40 mg via INTRAVENOUS
  Filled 2021-12-15 (×6): qty 10

## 2021-12-15 NOTE — ED Triage Notes (Signed)
Pt to ED via EMS from home for fall. Pt has hx of recurrent falls and dementia. Pt takes xarelto and has laceration to forehead with bleeding controlled upon arrival. Pt mentating at baseline per EMS.

## 2021-12-15 NOTE — H&P (Incomplete)
History and Physical    Patient: Craig [name] TGY:[id number] DOB: 05/15/1941 DOA: 12/15/2021 DOS: the patient was seen and examined on 12/15/2021 PCP: Smitty Cords, DO  Patient coming from: {Point_of_Origin:26777}  Chief Complaint:  Chief Complaint  Patient presents with   Fall   HPI: Craig [name] is a 81 y.o. 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 His smoking use included cigarettes. He has quit using smokeless tobacco. He reports current alcohol use of about 3.0 standard drinks per week. He reports that he does not use drugs.  Allergies  Allergen Reactions   Trazodone And Nefazodone Itching and Swelling    Family History  Problem Relation Age of Onset   CAD Father 65   Kidney failure Mother    CAD Brother     Prior to Admission medications   Medication Sig Start Date End Date Taking? Authorizing Provider  amLODipine (NORVASC) 5 MG tablet TAKE 1 TABLET (5 MG TOTAL) BY MOUTH DAILY. 02/10/21   Karamalegos, Netta Neat, DO  atorvastatin (LIPITOR) 40 MG tablet TAKE 1 TABLET (40 MG TOTAL) BY MOUTH DAILY. 02/10/21   Karamalegos, Netta Neat, DO  cholecalciferol (VITAMIN D) 1000 UNITS tablet Take 1,000 Units by mouth daily.    [provider]  donepezil (ARICEPT) 5 MG tablet Take 1 tablet (5 mg total) by mouth at bedtime. 12/08/21   Karamalegos, Netta Neat, DO  escitalopram (LEXAPRO) 20  MG tablet TAKE 1 TABLET EVERY DAY 09/20/21   Karamalegos, Netta Neat, DO  fluticasone (FLONASE) 50 MCG/ACT nasal spray Place 2 sprays into both nostrils daily. Use for 4-6 weeks then stop and use seasonally or as needed. 12/08/21   Karamalegos, Netta Neat, DO  furosemide (LASIX) 40 MG tablet TAKE 1 TABLET (40 MG TOTAL) BY MOUTH DAILY AS NEEDED FOR FLUID OR EDEMA. 06/24/21   Althea Charon, Netta Neat, DO  losartan (COZAAR) 100 MG tablet TAKE 1 TABLET EVERY DAY 11/30/21   Karamalegos, Netta Neat, DO  Melatonin 5 MG TABS Take 10 mg by mouth at bedtime. 3 tabs    [provider]  metoprolol succinate (TOPROL-XL) 25 MG 24 hr tablet TAKE 1 TABLET (25 MG TOTAL) BY MOUTH DAILY. 01/26/21   Althea Charon, Netta Neat, DO  omeprazole (PRILOSEC) 40 MG capsule TAKE 1 CAPSULE EVERY DAY 08/27/21   Karamalegos, Netta Neat, DO  vitamin B-12 (CYANOCOBALAMIN) 500 MCG tablet Take 500 mcg by mouth daily.    [provider]  vitamin E 1000 UNIT capsule Take 1,000 Units by mouth daily.    [provider]  XARELTO 20 MG TABS tablet TAKE 1 TABLET (20 MG TOTAL) BY MOUTH DAILY WITH SUPPER. 02/10/21   Karamalegos, Netta Neat, DO  Zoster Vaccine Adjuvanted [facility]) injection Inject 0.64mL intramuscular once. Then repeat dose after 2 months. Patient not taking: Reported on 12/08/2021 05/27/18   Smitty Cords, DO    Physical Exam:  Vitals:   12/15/21 1419 12/15/21 1600 12/15/21 1813 12/15/21 1818  BP:  120/72 128/87   Pulse: 68 61 66   Resp: 20 10 14    Temp: 98 F (36.7 C) 98 F (36.7 C)    TempSrc:  Oral    SpO2: 90% 96% 91% 94%  Weight:      Height:       *** Data Reviewed: Results for orders placed or performed during the hospital encounter of 12/15/21 (from the past 24 hour(s))  CBC     Status: Abnormal   Collection Time: 12/15/21  2:16 PM  Result Value Ref Range   WBC 5.2 4.0 - 10.5 K/uL   RBC 4.40 4.22 - 5.81 MIL/uL   Hemoglobin 13.6 13.0 - 17.0 g/dL   HCT 12/17/21 88.4 - 16.6 %    MCV 93.0 80.0 - 100.0 fL   MCH 30.9 26.0 - 34.0 pg   MCHC 33.3 30.0 - 36.0 g/dL   RDW 06.3 (H) 01.6 - 01.0 %   Platelets 226 150 - 400 K/uL   nRBC 0.0 0.0 - 0.2 %  Basic metabolic panel     Status: None   Collection Time: 12/15/21  2:16 PM  Result Value Ref Range   Sodium 139 135 - 145 mmol/L   Potassium 4.3 3.5 - 5.1 mmol/L   Chloride 102 98 - 111 mmol/L   CO2 32 22 - 32 mmol/L   Glucose, Bld 99 70 - 99 mg/dL   BUN 15 8 - 23 mg/dL   Creatinine, Ser 12/17/21 0.61 - 1.24 mg/dL   Calcium 9.2 8.9 - 3.55 mg/dL   GFR, Estimated 73.2 >20 mL/min   Anion gap 5 5 - 15  Protime-INR     Status: Abnormal   Collection Time: 12/15/21  2:16 PM  Result Value Ref Range   Prothrombin Time 24.6 (H) 11.4 - 15.2 seconds   INR 2.2 (H) 0.8 - 1.2  APTT     Status: Abnormal   Collection Time: 12/15/21  2:16 PM  Result Value Ref Range   aPTT 38 (H) 24 - 36 seconds  Troponin I (High Sensitivity)     Status: None   Collection Time: 12/15/21  2:18 PM  Result Value Ref Range   Troponin I (High Sensitivity) 8 <18 ng/L  Brain natriuretic peptide     Status: Abnormal   Collection Time: 12/15/21  2:18 PM  Result Value Ref Range   B Natriuretic Peptide 363.3 (H) 0.0 - 100.0 pg/mL  Resp Panel by RT-PCR (Flu A&B, Covid) Anterior Nasal Swab     Status: None   Collection Time: 12/15/21  2:46 PM   Specimen: Anterior Nasal Swab  Result Value Ref Range   SARS Coronavirus 2 by RT PCR NEGATIVE NEGATIVE   Influenza A by PCR NEGATIVE NEGATIVE   Influenza B by PCR NEGATIVE NEGATIVE  .   Assessment and Plan: No notes have been filed under this hospital service. Service: Hospitalist     Advance Care Planning:   Code Status: Prior ***  Consults: ***  Family Communication: ***  Severity of Illness: {Observation/Inpatient:21159}  Author: 12/17/21, MD 12/15/2021 6:21 PM  For on call review [email or web address].

## 2021-12-15 NOTE — ED Provider Notes (Signed)
Patient currently at CT   Sharyn Creamer, MD 12/15/21 1434

## 2021-12-15 NOTE — ED Provider Notes (Signed)
Emergency Medicine Provider Triage Evaluation Note  Craig [name] , a 81 y.o. male  was evaluated in triage.  Pt complains of falling out of bed, cutting his left eyebrow.  Review of Systems  Positive: [name] out of bed, reports he has a cut over his left eyebrow.   Negative: No headache.  No neck pain.  Physical Exam  BP 123/79   Resp 20   Ht 6\' 3"  (1.905 m)   Wt 114.6 kg   BMI 31.57 kg/m  Gen:   Awake, no distress   Resp:  Normal effort  MSK:   Moves extremities without difficulty  Other:  Well oriented.  Bleeding controlled, bandage over his left forehead.  Medical Decision Making  Medically screening exam initiated at 2:15 PM.  Appropriate orders placed.  was informed that the remainder of the evaluation will be completed by another provider, this initial triage assessment does not replace that evaluation, and the importance of remaining in the ED until their evaluation is complete.  Discussed with patient patient agreeable and understanding with obtaining CT scan.  He does take anticoagulant   Craig Baumgartner, MD 12/15/21 1416

## 2021-12-15 NOTE — ED Provider Notes (Signed)
Patient received in signout from Dr. Fuller Plan pending follow-up CT imaging.  CT imaging shows no evidence of traumatic injury but does have evidence of ascites and anasarca pleural effusions.  He is hypoxic requiring O2 which she does not use at home.  Will order IV Lasix.  Will consult hospitalist for admission.   [name], MD 12/15/21 [phone]

## 2021-12-15 NOTE — ED Provider Notes (Signed)
[facility] Provider Note    Event Date/Time   First MD Initiated Contact with Patient 12/15/21 1444     (approximate)   History   Fall   HPI  Craig [name] is a 81 y.o. male with lymphedema, hypertension, PE on Xarelto who comes in with concern for fall.  Patient reports having a fall where he rolled out of bed and landed on his forehead.  He reports feeling at his normal self.  He states that he lives with his girlfriend at home.  He denies any other trauma to his chest abdomen or pelvis.  He does report some chronic shortness of breath but denies missing any of his blood thinner.  He does report that his legs are little bit more swollen than normal.   Physical Exam   Triage Vital Signs: ED Triage Vitals  Enc Vitals Group     BP 12/15/21 1409 123/79     Pulse Rate 12/15/21 1419 68     Resp 12/15/21 1409 20     Temp 12/15/21 1419 98 F (36.7 C)     Temp src --      SpO2 12/15/21 1419 90 %     Weight 12/15/21 1405 252 lb 9.3 oz (114.6 kg)     Height 12/15/21 1405 6\' 3"  (1.905 m)     Head Circumference --      Peak Flow --      Pain Score --      Pain Loc --      Pain Edu? --      Excl. in GC? --     Most recent vital signs: Vitals:   12/15/21 1409 12/15/21 1419  BP: 123/79   Pulse:  68  Resp: 20 20  Temp:  98 F (36.7 C)  SpO2:  90%     General: Awake, no distress.  CV:  Good peripheral perfusion.  Resp:  Normal effort.  Abd:  No distention.  Soft and nontender Other:  Patient has a 3 cm laceration above the left eyebrow.  Pupil is intact and is reactive.  Extraocular movements are intact.  No orbital tenderness. Swelling ntoed to bilaterally legs.    ED Results / Procedures / Treatments   Labs (all labs ordered are listed, but only abnormal results are displayed) Labs Reviewed  CBC - Abnormal; Notable for the following components:      Result Value   RDW 22.2 (*)    All other components within normal limits  PROTIME-INR -  Abnormal; Notable for the following components:   Prothrombin Time 24.6 (*)    INR 2.2 (*)    All other components within normal limits  APTT - Abnormal; Notable for the following components:   aPTT 38 (*)    All other components within normal limits  RESP PANEL BY RT-PCR (FLU A&B, COVID) [id number]  BASIC METABOLIC PANEL  BRAIN NATRIURETIC PEPTIDE  TROPONIN I (HIGH SENSITIVITY)     EKG  My interpretation of EKG:  Atrial fibrillation rate of 75 without any ST elevation or T wave version except for V3 with a right bundle branch block  RADIOLOGY Pending  PROCEDURES:  Critical Care performed: Yes, see critical care procedure note(s)  .1-3 Lead EKG Interpretation Performed by: Concha SeFunke, Anisha Starliper E, MD Authorized by: Concha SeFunke, Bralon Antkowiak E, MD     Interpretation: abnormal     ECG rate:  75   ECG rate assessment: normal     Rhythm: atrial fibrillation  Ectopy: none     Conduction: normal   .Critical Care Performed by: Concha Se, MD Authorized by: Concha Se, MD   Critical care provider statement:    Critical care time (minutes):  30   Critical care was necessary to treat or prevent imminent or life-threatening deterioration of the following conditions:  Respiratory failure   Critical care was time spent personally by me on the following activities:  Development of treatment plan with patient or surrogate, discussions with consultants, evaluation of patient's response to treatment, examination of patient, ordering and review of laboratory studies, ordering and review of radiographic studies, ordering and performing treatments and interventions, pulse oximetry, re-evaluation of patient's condition and review of old charts .Marland KitchenLaceration Repair  Date/Time: 12/15/2021 2:58 PM Performed by: Concha Se, MD Authorized by: Concha Se, MD   Consent:    Consent obtained:  Verbal   Consent given by:  Patient   Risks discussed:  Infection   Alternatives discussed:  No  treatment Universal protocol:    Patient identity confirmed:  Verbally with patient Anesthesia:    Anesthesia method:  Local infiltration   Local anesthetic:  Lidocaine 1% WITH epi Laceration details:    Location:  Face   Face location:  L eyebrow   Length (cm):  3   Depth (mm):  1 Exploration:    Limited defect created (wound extended): yes     Hemostasis achieved with:  Epinephrine and direct pressure   Imaging outcome: foreign body not noted     Contaminated: no   Treatment:    Area cleansed with:  Saline   Irrigation method:  Syringe   Debridement:  None   Undermining:  None Skin repair:    Repair method:  Sutures   Suture size:  6-0   Suture material:  Prolene   Number of sutures:  4 Approximation:    Approximation:  Close Repair type:    Repair type:  Simple Post-procedure details:    Procedure completion:  Tolerated well, no immediate complications   MEDICATIONS ORDERED IN ED: Medications - No data to display   IMPRESSION / MDM / ASSESSMENT AND PLAN / ED COURSE  I reviewed the triage vital signs and the nursing notes.   Patient comes in with a what sounds like a mechanical fall but on blood thinner so will get CT imaging to evaluate for any acute life threatening pathology differential including: intracranial hemorrhage, cervical fracture, facial fracture.  Patient was hypoxic to 88% so placed on 2 L.  We will get a chest x-ray and COVID test.  He does report a little bit of shortness of breath and his legs have some asymmetric swelling's we will get ultrasounds to evaluate for DVT.  If no other cause of hypoxia he may need CT imaging of this as well  Bmp normal  Cbc normal  INR elevated    Laceration was a repair.  Patient reports tetanus is up-to-date within the last 5 years.  He understands that the sutures need to be removed in 5 days.  Patient be handed off to oncoming team pending the rest of the work-up  Patient declines anything for pain  The  patient is on the cardiac monitor to evaluate for evidence of arrhythmia and/or significant heart rate changes.      FINAL CLINICAL IMPRESSION(S) / ED DIAGNOSES   Final diagnoses:  Fall, initial encounter  Laceration of scalp, initial encounter     Rx / DC Orders  ED Discharge Orders     None        Note:  This document was prepared using Dragon voice recognition software and may include unintentional dictation errors.   Concha Se, MD 12/15/21 [phone]

## 2021-12-15 NOTE — H&P (Signed)
History and Physical    Patient: Craig [name] WNU:[id number] DOB: Apr 13, 1941 DOA: 12/15/2021 DOS: the patient was seen and examined on 12/16/2021 PCP: Smitty Cords, DO  Patient coming from: Home  Chief Complaint:  Chief Complaint  Patient presents with   Fall   HPI: [name] is a 81 y.o. male with medical history significant of anxiety, hypertension, pulmonary embolism, DVTs, TTP, stroke syndrome presenting today with fall this morning.  Patient reported shortness of breath and swelling of his legs.  Patient does not give any history and is not aware of any history of cirrhosis however imaging does have some ascites which I suspect is from the alcohol.  Patient drinks on a daily basis mixed drink with vodka.  Wife and family at bedside do not report any withdrawals or any concerns.  Patient was taking Xarelto for his pulmonary embolism and there was a period where he was taken off of Xarelto after being on it for quite a while and was restarted.  He has a history of tobacco.  Per wife patient has no history of alcohol withdrawal or seizures or shakes. Patient reports chest pain, shortness of breath, edema affecting his leg thighs and his abdomen.  Patient did not know that he has ascites and has no history of cirrhosis that he knows of.  Per wife patient's never had any withdrawals shakes DTs or seizures in relation to alcohol.  Review of Systems: Review of Systems  Respiratory:  Positive for shortness of breath.   Cardiovascular:  Positive for chest pain and leg swelling.  All other systems reviewed and are negative.  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[facility])    Past Surgical History:  Procedure Laterality Date   LEFT HEART CATHETERIZATION WITH CORONARY ANGIOGRAM Bilateral 07/27/2014   Procedure: LEFT HEART CATHETERIZATION WITH CORONARY ANGIOGRAM;  Surgeon: Runell Gess, MD;  Location: [facility] CATH LAB;  Service: Cardiovascular;  Laterality: Bilateral;   SPLENECTOMY, TOTAL     TTP   Social History:  reports that he quit smoking about 43 years ago. His smoking use included cigarettes. He has quit using smokeless tobacco. He reports current alcohol use of about 3.0 standard drinks per week. He reports that he does not use drugs.  Allergies  Allergen Reactions   Trazodone And Nefazodone Itching and Swelling    Family History  Problem Relation Age of Onset   CAD Father 21   Kidney failure Mother    CAD Brother     Prior to Admission medications   Medication Sig Start Date End Date Taking? Authorizing Provider  amLODipine (NORVASC) 5 MG tablet TAKE 1 TABLET (5 MG TOTAL) BY MOUTH DAILY. 02/10/21   Karamalegos, Netta Neat, DO  atorvastatin (LIPITOR) 40 MG tablet TAKE 1 TABLET (40 MG TOTAL) BY MOUTH DAILY. 02/10/21   Karamalegos, Netta Neat, DO  cholecalciferol (VITAMIN D) 1000 UNITS tablet Take 1,000 Units by mouth daily.    [provider]  donepezil (ARICEPT) 5 MG tablet Take 1 tablet (5 mg total) by mouth at bedtime. 12/08/21   Karamalegos, Netta Neat, DO  escitalopram (LEXAPRO) 20 MG tablet TAKE 1 TABLET EVERY DAY 09/20/21   Karamalegos, Netta Neat, DO  fluticasone (FLONASE) 50 MCG/ACT nasal spray Place 2 sprays into both nostrils daily. Use for 4-6 weeks then stop and use seasonally or as needed.  12/08/21   Karamalegos, Netta Neat, DO  furosemide (LASIX) 40 MG tablet TAKE 1 TABLET (40 MG TOTAL) BY MOUTH DAILY AS NEEDED FOR FLUID OR EDEMA. 06/24/21   Althea Charon, Netta Neat, DO  losartan (COZAAR) 100 MG tablet TAKE 1 TABLET EVERY DAY 11/30/21   Karamalegos, Netta Neat, DO  Melatonin 5 MG TABS Take 10 mg by mouth at bedtime. 3 tabs    [provider]  metoprolol succinate (TOPROL-XL) 25 MG 24 hr tablet TAKE 1 TABLET (25 MG TOTAL) BY MOUTH DAILY. 01/26/21   Althea Charon, Netta Neat, DO  omeprazole (PRILOSEC) 40 MG capsule TAKE 1 CAPSULE  EVERY DAY 08/27/21   Karamalegos, Netta Neat, DO  vitamin B-12 (CYANOCOBALAMIN) 500 MCG tablet Take 500 mcg by mouth daily.    [provider]  vitamin E 1000 UNIT capsule Take 1,000 Units by mouth daily.    [provider]  XARELTO 20 MG TABS tablet TAKE 1 TABLET (20 MG TOTAL) BY MOUTH DAILY WITH SUPPER. 02/10/21   Karamalegos, Netta Neat, DO  Zoster Vaccine Adjuvanted [facility]) injection Inject 0.45mL intramuscular once. Then repeat dose after 2 months. Patient not taking: Reported on 12/08/2021 05/27/18   Smitty Cords, DO    Physical Exam: Vitals:   12/15/21 1818 12/15/21 1953 12/15/21 2020 12/16/21 0005  BP:  127/84 125/71 114/67  Pulse:  90 63 (!) 56  Resp:  Temp:  97.7 F (36.5 C) 97.8 F (36.6 C) 98.1 F (36.7 C)  TempSrc:  Oral  Oral  SpO2: 94% 96% 97% 91%  Weight:      Height:      Physical Exam Vitals and nursing note reviewed.  Constitutional:      General: He is not in acute distress.    Appearance: Normal appearance. He is not ill-appearing, toxic-appearing or diaphoretic.  HENT:     Head: Normocephalic and atraumatic.     Right Ear: Hearing and external ear normal.     Left Ear: Hearing and external ear normal.     Nose: Nose normal. No nasal deformity.     Mouth/Throat:     Lips: Pink.     Mouth: Mucous membranes are moist.     Tongue: No lesions.     Pharynx: Oropharynx is clear.  Eyes:     Extraocular Movements: Extraocular movements intact.     Pupils: Pupils are equal, round, and reactive to light.  Neck:     Vascular: No carotid bruit.  Cardiovascular:     Rate and Rhythm: Normal rate and regular rhythm.     Pulses: Normal pulses.     Heart sounds: Normal heart sounds.  Pulmonary:     Effort: Pulmonary effort is normal.     Breath sounds: Rales present.  Abdominal:     General: Bowel sounds are normal. There is distension.     Palpations: Abdomen is soft. There is no mass.     Tenderness: There is no  abdominal tenderness. There is no guarding.     Hernia: No hernia is present.  Musculoskeletal:     Right lower leg: No edema.     Left lower leg: No edema.  Skin:    General: Skin is warm.     Findings: Bruising present.  Neurological:     General: No focal deficit present.     Mental Status: He is alert and oriented to person, place, and time.     Cranial Nerves: Cranial nerves 2-12 are intact.  Motor: Motor function is intact.  Psychiatric:        Attention and Perception: Attention normal.        Mood and Affect: Mood normal.        Speech: Speech normal.        Behavior: Behavior normal. Behavior is cooperative.        Cognition and Memory: Cognition normal.    Data Reviewed: Results for orders placed or performed during the hospital encounter of 12/15/21 (from the past 24 hour(s))  CBC     Status: Abnormal   Collection Time: 12/15/21  2:16 PM  Result Value Ref Range   WBC 5.2 4.0 - 10.5 K/uL   RBC 4.40 4.22 - 5.81 MIL/uL   Hemoglobin 13.6 13.0 - 17.0 g/dL   HCT 24.0 97.3 - 53.2 %   MCV 93.0 80.0 - 100.0 fL   MCH 30.9 26.0 - 34.0 pg   MCHC 33.3 30.0 - 36.0 g/dL   RDW 99.2 (H) 42.6 - 83.4 %   Platelets 226 150 - 400 K/uL   nRBC 0.0 0.0 - 0.2 %  Basic metabolic panel     Status: None   Collection Time: 12/15/21  2:16 PM  Result Value Ref Range   Sodium 139 135 - 145 mmol/L   Potassium 4.3 3.5 - 5.1 mmol/L   Chloride 102 98 - 111 mmol/L   CO2 32 22 - 32 mmol/L   Glucose, Bld 99 70 - 99 mg/dL   BUN 15 8 - 23 mg/dL   Creatinine, Ser 1.96 0.61 - 1.24 mg/dL   Calcium 9.2 8.9 - 22.2 mg/dL   GFR, Estimated >97 >98 mL/min   Anion gap 5 5 - 15  Protime-INR     Status: Abnormal   Collection Time: 12/15/21  2:16 PM  Result Value Ref Range   Prothrombin Time 24.6 (H) 11.4 - 15.2 seconds   INR 2.2 (H) 0.8 - 1.2  APTT     Status: Abnormal   Collection Time: 12/15/21  2:16 PM  Result Value Ref Range   aPTT 38 (H) 24 - 36 seconds  Troponin I (High Sensitivity)      Status: None   Collection Time: 12/15/21  2:18 PM  Result Value Ref Range   Troponin I (High Sensitivity) 8 <18 ng/L  Brain natriuretic peptide     Status: Abnormal   Collection Time: 12/15/21  2:18 PM  Result Value Ref Range   B Natriuretic Peptide 363.3 (H) 0.0 - 100.0 pg/mL  Resp Panel by RT-PCR (Flu A&B, Covid) Anterior Nasal Swab     Status: None   Collection Time: 12/15/21  2:46 PM   Specimen: Anterior Nasal Swab  Result Value Ref Range   SARS Coronavirus 2 by RT PCR NEGATIVE NEGATIVE   Influenza A by PCR NEGATIVE NEGATIVE   Influenza B by PCR NEGATIVE NEGATIVE  .   Assessment and Plan: * Falls Attribute fall due to balance issue. ? 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 We will get orthostatic vitals.  Pt also has intermittent dizziness at home that he reports and we will get PT consult.   SOB (shortness of breath) We will get echo and supplemental oxygen. SpO2: 91 % O2 Flow Rate (L/min): 4 L/min    Anasarca Suspect from CHF will get echo but also alcoholic liver disease and hypoalbuminemia.  Cautious diuretic PRN, pt got lasix 40 mg iv x 1 now.  Electrolytes monitoring.    Recurrent pulmonary emboli (HCC) CT imaging does show  chronic pulmonary embolism patient's on Xarelto currently. No changes in anticoagulation plan currently. I will however get a 2D echocardiogram and suspect patient has right heart failure or pulmonary hypertension and needs Evaluation for sleep apnea on outpatient basis when stable.   Essential hypertension Blood pressure 114/67, pulse (!) 56, temperature 98.1 F (36.7 C), temperature source Oral, resp. rate 20, height 6\' 3"  (1.905 m), weight 114.6 kg, SpO2 91 %. Home regimen of metoprolol and losartan continued.  Amlodipine held.  Lasix held. If blood pressure overnight is still 120s or under 130 we will change metoprolol to propranolol or consider cutting that losartan to 50 mg.      Advance Care Planning:    Code Status: Full Code    Consults:  None   Family Communication:  Ashley,Beverly (Friend)  [phone] (Home Phone)  Severity of Illness: The appropriate patient status for this patient is OBSERVATION. Observation status is judged to be reasonable and necessary in order to provide the required intensity of service to ensure the patient's safety. The patient's presenting symptoms, physical exam findings, and initial radiographic and laboratory data in the context of their medical condition is felt to place them at decreased risk for further clinical deterioration. Furthermore, it is anticipated that the patient will be medically stable for discharge from the hospital within 2 midnights of admission.   Author: Gertha CalkinEkta V Orva Gwaltney, MD 12/16/2021 1:32 AM  For on call review [email or web address].

## 2021-12-15 NOTE — ED Notes (Signed)
Pt states he had fell at home today. Pt does have a laceration on his forehead and was stitched by MD. Pt does take xarelto at home. Per wife, pt has been having multiple falls.

## 2021-12-16 ENCOUNTER — Inpatient Hospital Stay: Admit: 2021-12-16 | Payer: Medicare PPO

## 2021-12-16 ENCOUNTER — Inpatient Hospital Stay (HOSPITAL_COMMUNITY)
Admit: 2021-12-16 | Discharge: 2021-12-16 | Disposition: A | Discharge status: Home / Self Care | Payer: Medicare PPO | Attending: Internal Medicine | Admitting: Internal Medicine

## 2021-12-16 ENCOUNTER — Inpatient Hospital Stay: Payer: Medicare PPO

## 2021-12-16 DIAGNOSIS — I5021 Acute systolic (congestive) heart failure: Secondary | ICD-10-CM

## 2021-12-16 DIAGNOSIS — I10 Essential (primary) hypertension: Secondary | ICD-10-CM | POA: Diagnosis not present

## 2021-12-16 DIAGNOSIS — R601 Generalized edema: Secondary | ICD-10-CM | POA: Diagnosis present

## 2021-12-16 DIAGNOSIS — W19XXXA Unspecified fall, initial encounter: Secondary | ICD-10-CM | POA: Diagnosis not present

## 2021-12-16 DIAGNOSIS — R188 Other ascites: Secondary | ICD-10-CM

## 2021-12-16 DIAGNOSIS — J9601 Acute respiratory failure with hypoxia: Secondary | ICD-10-CM

## 2021-12-16 DIAGNOSIS — R0602 Shortness of breath: Secondary | ICD-10-CM | POA: Diagnosis present

## 2021-12-16 LAB — HEPATITIS B CORE ANTIBODY, TOTAL: Hep B Core Total Ab: NONREACTIVE

## 2021-12-16 LAB — URINALYSIS, COMPLETE (UACMP) WITH MICROSCOPIC
Bilirubin Urine: NEGATIVE
Glucose, UA: NEGATIVE mg/dL
Ketones, ur: NEGATIVE mg/dL
Nitrite: NEGATIVE
Protein, ur: NEGATIVE mg/dL
Specific Gravity, Urine: 1.02 (ref 1.005–1.030)
Squamous Epithelial / HPF: NONE SEEN (ref 0–5)
pH: 6 (ref 5.0–8.0)

## 2021-12-16 LAB — PROTEIN, PLEURAL OR PERITONEAL FLUID: Total protein, fluid: 3.4 g/dL

## 2021-12-16 LAB — GLUCOSE, PLEURAL OR PERITONEAL FLUID: Glucose, Fluid: 101 mg/dL

## 2021-12-16 LAB — COMPREHENSIVE METABOLIC PANEL
ALT: 8 U/L (ref 0–44)
AST: 18 U/L (ref 15–41)
Albumin: 3.3 g/dL — ABNORMAL LOW (ref 3.5–5.0)
Alkaline Phosphatase: 59 U/L (ref 38–126)
Anion gap: 7 (ref 5–15)
BUN: 14 mg/dL (ref 8–23)
CO2: 34 mmol/L — ABNORMAL HIGH (ref 22–32)
Calcium: 9.1 mg/dL (ref 8.9–10.3)
Chloride: 99 mmol/L (ref 98–111)
Creatinine, Ser: 0.81 mg/dL (ref 0.61–1.24)
GFR, Estimated: >60 mL/min (ref >60)
Glucose, Bld: 83 mg/dL (ref 70–99)
Potassium: 3.5 mmol/L (ref 3.5–5.1)
Sodium: 140 mmol/L (ref 135–145)
Total Bilirubin: 1.4 mg/dL — ABNORMAL HIGH (ref 0.3–1.2)
Total Protein: 6.5 g/dL (ref 6.5–8.1)

## 2021-12-16 LAB — IRON AND TIBC
Iron: 37 ug/dL — ABNORMAL LOW (ref 45–182)
Saturation Ratios: 9 % — ABNORMAL LOW (ref 17.9–39.5)
TIBC: 399 ug/dL (ref 250–450)
UIBC: 362 ug/dL

## 2021-12-16 LAB — BODY FLUID CELL COUNT WITH DIFFERENTIAL
Eos, Fluid: 0 %
Lymphs, Fluid: 76 %
Monocyte-Macrophage-Serous Fluid: 19 %
Neutrophil Count, Fluid: 5 %
Total Nucleated Cell Count, Fluid: 1297 cu mm

## 2021-12-16 LAB — GLUCOSE, CAPILLARY: Glucose-Capillary: 175 mg/dL — ABNORMAL HIGH (ref 70–99)

## 2021-12-16 LAB — ECHOCARDIOGRAM COMPLETE
AR max vel: 2.95 cm2
AV Area VTI: 3.01 cm2
AV Area mean vel: 2.91 cm2
AV Mean grad: 2 mmHg
AV Peak grad: 4 mmHg
Ao pk vel: 1 m/s
Area-P 1/2: 3.83 cm2
Height: 75 in
S' Lateral: 3.8 cm
Weight: 4041.3 oz

## 2021-12-16 LAB — CBC
HCT: 37.2 % — ABNORMAL LOW (ref 39.0–52.0)
Hemoglobin: 12.6 g/dL — ABNORMAL LOW (ref 13.0–17.0)
MCH: 31 pg (ref 26.0–34.0)
MCHC: 33.9 g/dL (ref 30.0–36.0)
MCV: 91.4 fL (ref 80.0–100.0)
Platelets: 218 10*3/uL (ref 150–400)
RBC: 4.07 MIL/uL — ABNORMAL LOW (ref 4.22–5.81)
RDW: 22 % — ABNORMAL HIGH (ref 11.5–15.5)
WBC: 5.1 10*3/uL (ref 4.0–10.5)
nRBC: 0 % (ref 0.0–0.2)

## 2021-12-16 LAB — LACTATE DEHYDROGENASE, PLEURAL OR PERITONEAL FLUID: LD, Fluid: 74 U/L — ABNORMAL HIGH (ref 3–23)

## 2021-12-16 LAB — ALBUMIN, PLEURAL OR PERITONEAL FLUID: Albumin, Fluid: 2.1 g/dL

## 2021-12-16 LAB — LACTATE DEHYDROGENASE: LDH: 147 U/L (ref 98–192)

## 2021-12-16 LAB — HEPATITIS B SURFACE ANTIGEN: Hepatitis B Surface Ag: NONREACTIVE

## 2021-12-16 MED ORDER — RIVAROXABAN 15 MG PO TABS
15.0000 mg | ORAL_TABLET | Freq: Two times a day (BID) | ORAL | Status: DC
  Administered 2021-12-16 – 2021-12-17 (×2): 15 mg via ORAL
  Filled 2021-12-16 (×2): qty 1

## 2021-12-16 MED ORDER — RIVAROXABAN 20 MG PO TABS: 20.0000 mg | ORAL_TABLET | Freq: Every day | ORAL | Status: DC

## 2021-12-16 MED ORDER — FUROSEMIDE 10 MG/ML IJ SOLN
40.0000 mg | Freq: Every day | INTRAMUSCULAR | Status: DC
  Administered 2021-12-16: 40 mg via INTRAVENOUS
  Filled 2021-12-16: qty 4

## 2021-12-16 MED ORDER — FUROSEMIDE 20 MG PO TABS: 20.0000 mg | ORAL_TABLET | Freq: Every day | ORAL | Status: DC

## 2021-12-16 MED ORDER — SPIRONOLACTONE 25 MG PO TABS
100.0000 mg | ORAL_TABLET | Freq: Every day | ORAL | Status: DC
  Administered 2021-12-17 – 2021-12-18 (×2): 100 mg via ORAL
  Filled 2021-12-16 (×2): qty 4

## 2021-12-16 MED ORDER — FUROSEMIDE 40 MG PO TABS
40.0000 mg | ORAL_TABLET | Freq: Every day | ORAL | Status: DC
  Administered 2021-12-17 – 2021-12-18 (×2): 40 mg via ORAL
  Filled 2021-12-16 (×2): qty 1

## 2021-12-16 MED ORDER — ALBUMIN HUMAN 25 % IV SOLN: 25.0000 g | Freq: Once | INTRAVENOUS | Status: DC | PRN

## 2021-12-16 MED ORDER — SPIRONOLACTONE 25 MG PO TABS: 25.0000 mg | ORAL_TABLET | Freq: Every day | ORAL | Status: DC

## 2021-12-16 NOTE — Procedures (Signed)
PROCEDURE SUMMARY:  Successful image-guided paracentesis from the right upper abdomen.  Yielded 2.8 liters of clear yellow fluid.  No immediate complications.  EBL < 1 mL Patient tolerated well.   Specimen was sent for labs.  Please see imaging section of Epic for full dictation.  Villa Herb PA-C 12/16/2021 10:43 AM

## 2021-12-16 NOTE — Assessment & Plan Note (Signed)
We will get echo and supplemental oxygen. SpO2: 91 % O2 Flow Rate (L/min): 4 L/min

## 2021-12-16 NOTE — Assessment & Plan Note (Signed)
CT imaging does show chronic pulmonary embolism patient's on Xarelto currently. No changes in anticoagulation plan currently. I will however get a 2D echocardiogram and suspect patient has right heart failure or pulmonary hypertension and needs Evaluation for sleep apnea on outpatient basis when stable.

## 2021-12-16 NOTE — Progress Notes (Addendum)
PROGRESS NOTE    Craig [name]  AVW:[id number] DOB: 1941-02-27 DOA: 12/15/2021 PCP: Smitty Cords, DO  Outpatient Specialists: cardiology, vascular surgery    Brief Narrative:  From admission h and p Craig [name] is a 81 y.o. male with medical history significant of anxiety, hypertension, pulmonary embolism, DVTs, TTP, stroke syndrome presenting today with fall this morning.  Patient reported shortness of breath and swelling of his legs.  Patient does not give any history and is not aware of any history of cirrhosis however imaging does have some ascites which I suspect is from the alcohol.  Patient drinks on a daily basis mixed drink with vodka.  Wife and family at bedside do not report any withdrawals or any concerns.  Patient was taking Xarelto for his pulmonary embolism and there was a period where he was taken off of Xarelto after being on it for quite a while and was restarted.  He has a history of tobacco.  Per wife patient has no history of alcohol withdrawal or seizures or shakes. Patient reports chest pain, shortness of breath, edema affecting his leg thighs and his abdomen.  Patient did not know that he has ascites and has no history of cirrhosis that he knows of.  Per wife patient's never had any withdrawals shakes DTs or seizures in relation to alcohol.  Assessment & Plan:   Principal Problem:   Falls Active Problems:   SOB (shortness of breath)   Anasarca   Essential hypertension   Recurrent pulmonary emboli (HCC)  # Ascites # Pleural effusions Possible new decompensated cirrhosis. Bnp is elevated so chf is also in ddx. No abd tenderness to suggest sbp. Heavy drinking the likely culprit. Platelets normal. Albumin mildly low. Inr elevated though on doac. - ruq u/s with dopplers - paracentesis - urinalysis - TTE - iron panel - hepatitis panel - lasix 40 iv qd - may need thoracentesis  - hep b/c  # Acute hypoxic respiratory failure Breathing comfortably on  5 L. No PE or focal consolidation on CTA. Does have b/l effusions - f/u above results - diuresis as above - may need diagnostic/therapeutic thora if fails to improve - wean [place] o2  # Falls With bruise left eye. No fracture or other acute pathology on ct of face/head/neck - PT consult  # HTN Here bp wnl - cont home metop - hold home losart to allow room for diuresis  # History PE No actute PE or DVT on imaging - cont home xarelto  # Dementia - cont home aricept  # Alcohol use Reports one drink/day currently, previous heavy drinker - monitor for signs withdrawal but assuming his reported consumption is accurate unlikely to withdraw  # Lymphedema Followed by vascular, plan for lymphedema pump   DVT prophylaxis: home xarelto Code Status: full Family Communication: no answer when contact called  Level of care: Telemetry Cardiac Status is: Inpatient Remains inpatient appropriate because: severity of illness    Consultants:  none  Procedures: none  Antimicrobials:  none    Subjective: Feels well. No chest pain or sob.  Objective: Vitals:   12/15/21 2020 12/16/21 0005 12/16/21 0348 12/16/21 0440  BP: 125/71 114/67 114/69   Pulse: 63 (!) 56 (!) 43 60  Resp: Temp: 97.8 F (36.6 C) 98.1 F (36.7 C) 97.6 F (36.4 C)   TempSrc:  Oral    SpO2: 97% 91% (!) 84% 94%  Weight:      Height:  Intake/Output Summary (Last 24 hours) at 12/16/2021 [id number] Last data filed at 12/16/2021 0636 Gross per 24 hour  Intake --  Output 2475 ml  Net -2475 ml   Filed Weights   12/15/21 1405  Weight: 114.6 kg    Examination:  General exam: Appears calm and comfortable  Respiratory system: rales and decreased breaths ounds at bases Cardiovascular system: S1 & S2 heard, RRR. No JVD, murmurs, rubs, gallops or clicks.  Gastrointestinal system: Abdomen is distended, soft and nontender. No organomegaly or masses felt. Normal bowel sounds heard. Central nervous  system: Alert and oriented. No focal neurological deficits. Extremities: Symmetric 5 x 5 power. Skin: bruise left eye. Pitting edema right leg greater than left Psychiatry: appears a bit confused    Data Reviewed: I have personally reviewed following labs and imaging studies  CBC: Recent Labs  Lab 12/15/21 1416 12/16/21 0506  WBC 5.2 5.1  HGB 13.6 12.6*  HCT 40.9 37.2*  MCV 93.0 91.4  PLT 226 218   Basic Metabolic Panel: Recent Labs  Lab 12/15/21 1416 12/16/21 0506  NA 139 140  K 4.3 3.5  CL 102 99  CO2 32 34*  GLUCOSE 99 83  BUN 15 14  CREATININE 0.93 0.81  CALCIUM 9.2 9.1   GFR: Estimated Creatinine Clearance: 100.9 mL/min (by C-G formula based on SCr of 0.81 mg/dL). Liver Function Tests: Recent Labs  Lab 12/16/21 0506  AST 18  ALT 8  ALKPHOS 59  BILITOT 1.4*  PROT 6.5  ALBUMIN 3.3*   No results for input(s): LIPASE, AMYLASE in the last 168 hours. No results for input(s): AMMONIA in the last 168 hours. Coagulation Profile: Recent Labs  Lab 12/15/21 1416  INR 2.2*   Cardiac Enzymes: No results for input(s): CKTOTAL, CKMB, CKMBINDEX, TROPONINI in the last 168 hours. BNP (last 3 results) No results for input(s): PROBNP in the last 8760 hours. HbA1C: No results for input(s): HGBA1C in the last 72 hours. CBG: No results for input(s): GLUCAP in the last 168 hours. Lipid Profile: No results for input(s): CHOL, HDL, LDLCALC, TRIG, CHOLHDL, LDLDIRECT in the last 72 hours. Thyroid Function Tests: No results for input(s): TSH, T4TOTAL, FREET4, T3FREE, THYROIDAB in the last 72 hours. Anemia Panel: No results for input(s): VITAMINB12, FOLATE, FERRITIN, TIBC, IRON, RETICCTPCT in the last 72 hours. Urine analysis:    Component Value Date/Time   COLORURINE YELLOW (A) 04/06/2021 1825   APPEARANCEUR CLEAR (A) 04/06/2021 1825   APPEARANCEUR Cloudy (A) 04/15/2015 1031   LABSPEC 1.012 04/06/2021 1825   PHURINE 6.0 04/06/2021 1825   GLUCOSEU NEGATIVE  04/06/2021 1825   HGBUR SMALL (A) 04/06/2021 1825   BILIRUBINUR NEGATIVE 04/06/2021 1825   BILIRUBINUR Negative 04/15/2015 1031   KETONESUR 5 (A) 04/06/2021 1825   PROTEINUR NEGATIVE 04/06/2021 1825   NITRITE NEGATIVE 04/06/2021 1825   LEUKOCYTESUR NEGATIVE 04/06/2021 1825   Sepsis Labs: @LABRCNTIP (procalcitonin:4,lacticidven:4)  ) Recent Results (from the past 240 hour(s))  Resp Panel by RT-PCR (Flu A&B, Covid) Anterior Nasal Swab     Status: None   Collection Time: 12/15/21  2:46 PM   Specimen: Anterior Nasal Swab  Result Value Ref Range Status   SARS Coronavirus 2 by RT PCR NEGATIVE NEGATIVE Final    Comment: (NOTE) SARS-CoV-2 target nucleic acids are NOT DETECTED.  The SARS-CoV-2 RNA is generally detectable in upper respiratory specimens during the acute phase of infection. The lowest concentration of SARS-CoV-2 viral copies this assay can detect is 138 copies/mL. A negative result does not  preclude SARS-Cov-2 infection and should not be used as the sole basis for treatment or other patient management decisions. A negative result may occur with  improper specimen collection/handling, submission of specimen other than nasopharyngeal swab, presence of viral mutation(s) within the areas targeted by this assay, and inadequate number of viral copies(<138 copies/mL). A negative result must be combined with clinical observations, patient history, and epidemiological information. The expected result is Negative.  Fact Sheet for Patients:  BloggerCourse.com  Fact Sheet for Healthcare Providers:  SeriousBroker.it  This test is no t yet approved or cleared by the [place] FDA and  has been authorized for detection and/or diagnosis of SARS-CoV-2 by FDA under an Emergency Use Authorization (EUA). This EUA will remain  in effect (meaning this test can be used) for the duration of the COVID-19 declaration under Section 564(b)(1)  of the Act, 21 U.S.C.section 360bbb-3(b)(1), unless the authorization is terminated  or revoked sooner.       Influenza A by PCR NEGATIVE NEGATIVE Final   Influenza B by PCR NEGATIVE NEGATIVE Final    Comment: (NOTE) The Xpert Xpress SARS-CoV-2/FLU/RSV plus assay is intended as an aid in the diagnosis of influenza from Nasopharyngeal swab specimens and should not be used as a sole basis for treatment. Nasal washings and aspirates are unacceptable for Xpert Xpress SARS-CoV-2/FLU/RSV testing.  Fact Sheet for Patients: BloggerCourse.com  Fact Sheet for Healthcare Providers: SeriousBroker.it  This test is not yet approved or cleared by the [place] FDA and has been authorized for detection and/or diagnosis of SARS-CoV-2 by FDA under an Emergency Use Authorization (EUA). This EUA will remain in effect (meaning this test can be used) for the duration of the COVID-19 declaration under Section 564(b)(1) of the Act, 21 U.S.C. section 360bbb-3(b)(1), unless the authorization is terminated or revoked.  Performed at [facility], [address] [place], [place] [address]          Radiology Studies: DG Chest 2 View  Result Date: 12/15/2021 CLINICAL DATA:  Shortness of breath, fall EXAM: CHEST - 2 VIEW COMPARISON:  Previous studies including the examination of 04/06/2021 FINDINGS: Transverse diameter of heart is increased. There are no signs of alveolar pulmonary edema. Increased density is seen in the medial left lower lung fields. Increased markings are seen in the right lower lung fields. Costophrenic angles are clear. There is no pneumothorax. IMPRESSION: Increased density in the medial left lower lung fields may suggest atelectasis/pneumonia. Increased interstitial markings in the right lower lung fields may suggest scarring and/or atelectasis/pneumonia. There are no signs of alveolar pulmonary edema. Electronically  Signed   By: Ernie Avena M.D.   On: 12/15/2021 16:01   CT Head Wo Contrast  Result Date: 12/15/2021 CLINICAL DATA:  Fall, blunt facial trauma, laceration to forehead, history dementia and recurrent falls, hypertension, stroke EXAM: CT HEAD WITHOUT CONTRAST CT MAXILLOFACIAL WITHOUT CONTRAST CT CERVICAL SPINE WITHOUT CONTRAST TECHNIQUE: Multidetector CT imaging of the head, cervical spine, and maxillofacial structures were performed using the standard protocol without intravenous contrast. Multiplanar CT image reconstructions of the cervical spine and maxillofacial structures were also generated. Right side of face marked with BB. RADIATION DOSE REDUCTION: This exam was performed according to the departmental dose-optimization program which includes automated exposure control, adjustment of the mA and/or kV according to patient size and/or use of iterative reconstruction technique. COMPARISON:  CT head 04/06/2021 FINDINGS: CT HEAD FINDINGS Brain: Generalized atrophy. Normal ventricular morphology. No midline shift or mass effect. Small vessel chronic ischemic  changes of deep cerebral white matter. Small nonspecific parenchymal calcification RIGHT frontal lobe unchanged. Otherwise normal appearance of brain parenchyma. No intracranial hemorrhage, mass lesion, or evidence of acute infarction. No extra-axial fluid collections. Vascular: Mild atherosclerotic calcification of internal carotid arteries at skull base Skull: Intact Other: N/A CT MAXILLOFACIAL FINDINGS Osseous: TMJ alignment normal bilaterally. No facial bone fractures identified. Orbits: Intraorbital soft tissue planes clear Sinuses: Minimal mucosal thickening in the maxillary sinuses. Remaining paranasal sinuses, visualized mastoid air cells and middle ear cavities clear. Soft tissues: Small frontal scalp contusion. Hematoma LEFT periorbital extending to nasal region. No other facial soft tissue abnormalities. CT CERVICAL SPINE FINDINGS  Alignment: Normal Skull base and vertebrae: Osseous mineralization normal. Skull base intact. Vertebral body heights maintained. Multilevel disc space narrowing and endplate spur formation. No fracture, subluxation, or bone destruction. Minor facet degenerative changes. Soft tissues and spinal canal: Prevertebral soft tissues normal thickness. Atherosclerotic calcifications at carotid bifurcations cervical soft tissues otherwise unremarkable. Disc levels:  No specific abnormalities Upper chest: Small BILATERAL pleural effusions at lung apices greater on RIGHT. Other: N/A IMPRESSION: Atrophy with small vessel chronic ischemic changes of deep cerebral white matter. No acute intracranial abnormalities. No acute facial bone abnormalities. 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 No acute cervical spine abnormalities. Small BILATERAL pleural effusions greater on RIGHT. Electronically Signed   By: Ulyses Southward M.D.   On: 12/15/2021 15:59   CT Angio Chest PE W and/or Wo Contrast  Result Date: 12/15/2021 CLINICAL DATA:  Concern for pulmonary embolism. Several falls recently. EXAM: CT ANGIOGRAPHY CHEST CT ABDOMEN AND PELVIS WITH CONTRAST TECHNIQUE: Multidetector CT imaging of the chest was performed using the standard protocol during bolus administration of intravenous contrast. Multiplanar CT image reconstructions and MIPs were obtained to evaluate the vascular anatomy. Multidetector CT imaging of the abdomen and pelvis was performed using the standard protocol during bolus administration of intravenous contrast. RADIATION DOSE REDUCTION: This exam was performed according to the departmental dose-optimization program which includes automated exposure control, adjustment of the mA and/or kV according to patient size and/or use of iterative reconstruction technique. CONTRAST:  OMNIPAQUE IOHEXOL 350 MG/ML SOLN COMPARISON:  None Available. FINDINGS: CTA CHEST FINDINGS  Cardiovascular: There is a thin linear filling defect within the RIGHT lower lobe pulmonary artery. This very thin curvilinear defect is favored residual synechia from remodeled remote thromboembolus. No occlusion. No filling defects within the pulmonary arteries to suggest acute pulmonary embolism. No pericardial fluid. Calcifications of the aortic arch coronary arteries. Mediastinum/Nodes: No axillary or supraclavicular adenopathy. No mediastinal or hilar adenopathy. No pericardial fluid. Esophagus normal. Lungs/Pleura: Moderate bilateral pleural effusions. No pulmonary infarction. Mild bibasilar passive atelectasis. Musculoskeletal: No aggressive osseous lesion Review of the MIP images confirms the above findings. CT ABDOMEN and PELVIS FINDINGS Hepatobiliary: No focal hepatic lesion. Postcholecystectomy. No biliary dilatation. Liver has a lobular contour. Portal veins are patent. Large volume intraperitoneal free fluid suggest ascites. Pancreas: Pancreas is normal. No ductal dilatation. No pancreatic inflammation. Spleen: Post splenectomy Adrenals/urinary tract: Kidneys enhance symmetrically. Normal adrenal glands. Nonenhancing cysts of the LEFT kidney. Nonobstructing calculus LEFT kidney. Ureters bladder normal. Stomach/Bowel: Stomach, small-bowel and cecum are normal. The appendix is not identified but there is no pericecal inflammation to suggest appendicitis. The colon and rectosigmoid colon are normal. Vascular/Lymphatic: Abdominal aorta is normal caliber with atherosclerotic calcification. There is no retroperitoneal or periportal lymphadenopathy. No pelvic lymphadenopathy. Reproductive: Prostate normal Other: Intraperitoneal free air. Large volume ascites. Anasarca the soft  tissues. Musculoskeletal: No aggressive osseous lesion. Review of the MIP images confirms the above findings. IMPRESSION: Chest Impression: 1. No evidence acute pulmonary embolism. 2. Remodeled chronic pulmonary embolism in the RIGHT  lower lobe pulmonary artery. Nonocclusive. 3. Moderate bilateral pleural effusions with passive atelectasis of the lower lobes. Abdomen / Pelvis Impression: 1. Large volume of intraperitoneal free fluid in the abdomen pelvis suggest ascites. 2. Portal veins are patent.  Lobular margin of the liver. 3. Post splenectomy. 4. Anasarca the subcutaneous tissues in the abdomen pelvis. Electronically Signed   By: Genevive Bi M.D.   On: 12/15/2021 16:59   CT Cervical Spine Wo Contrast  Result Date: 12/15/2021 CLINICAL DATA:  Fall, blunt facial trauma, laceration to forehead, history dementia and recurrent falls, hypertension, stroke EXAM: CT HEAD WITHOUT CONTRAST CT MAXILLOFACIAL WITHOUT CONTRAST CT CERVICAL SPINE WITHOUT CONTRAST TECHNIQUE: Multidetector CT imaging of the head, cervical spine, and maxillofacial structures were performed using the standard protocol without intravenous contrast. Multiplanar CT image reconstructions of the cervical spine and maxillofacial structures were also generated. Right side of face marked with BB. RADIATION DOSE REDUCTION: This exam was performed according to the departmental dose-optimization program which includes automated exposure control, adjustment of the mA and/or kV according to patient size and/or use of iterative reconstruction technique. COMPARISON:  CT head 04/06/2021 FINDINGS: CT HEAD FINDINGS Brain: Generalized atrophy. Normal ventricular morphology. No midline shift or mass effect. Small vessel chronic ischemic changes of deep cerebral white matter. Small nonspecific parenchymal calcification RIGHT frontal lobe unchanged. Otherwise normal appearance of brain parenchyma. No intracranial hemorrhage, mass lesion, or evidence of acute infarction. No extra-axial fluid collections. Vascular: Mild atherosclerotic calcification of internal carotid arteries at skull base Skull: Intact Other: N/A CT MAXILLOFACIAL FINDINGS Osseous: TMJ alignment normal bilaterally. No  facial bone fractures identified. Orbits: Intraorbital soft tissue planes clear Sinuses: Minimal mucosal thickening in the maxillary sinuses. Remaining paranasal sinuses, visualized mastoid air cells and middle ear cavities clear. Soft tissues: Small frontal scalp contusion. Hematoma LEFT periorbital extending to nasal region. No other facial soft tissue abnormalities. CT CERVICAL SPINE FINDINGS Alignment: Normal Skull base and vertebrae: Osseous mineralization normal. Skull base intact. Vertebral body heights maintained. Multilevel disc space narrowing and endplate spur formation. No fracture, subluxation, or bone destruction. Minor facet degenerative changes. Soft tissues and spinal canal: Prevertebral soft tissues normal thickness. Atherosclerotic calcifications at carotid bifurcations cervical soft tissues otherwise unremarkable. Disc levels:  No specific abnormalities Upper chest: Small BILATERAL pleural effusions at lung apices greater on RIGHT. Other: N/A IMPRESSION: Atrophy with small vessel chronic ischemic changes of deep cerebral white matter. No acute intracranial abnormalities. No acute facial bone abnormalities. 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 No acute cervical spine abnormalities. Small BILATERAL pleural effusions greater on RIGHT. Electronically Signed   By: Ulyses Southward M.D.   On: 12/15/2021 15:59   CT ABDOMEN PELVIS W CONTRAST  Result Date: 12/15/2021 CLINICAL DATA:  Concern for pulmonary embolism. Several falls recently. EXAM: CT ANGIOGRAPHY CHEST CT ABDOMEN AND PELVIS WITH CONTRAST TECHNIQUE: Multidetector CT imaging of the chest was performed using the standard protocol during bolus administration of intravenous contrast. Multiplanar CT image reconstructions and MIPs were obtained to evaluate the vascular anatomy. Multidetector CT imaging of the abdomen and pelvis was performed using the standard protocol during bolus administration of intravenous  contrast. RADIATION DOSE REDUCTION: This exam was performed according to the departmental dose-optimization program which includes automated exposure control, adjustment of the mA and/or kV  according to patient size and/or use of iterative reconstruction technique. CONTRAST:  OMNIPAQUE IOHEXOL 350 MG/ML SOLN COMPARISON:  None Available. FINDINGS: CTA CHEST FINDINGS Cardiovascular: There is a thin linear filling defect within the RIGHT lower lobe pulmonary artery. This very thin curvilinear defect is favored residual synechia from remodeled remote thromboembolus. No occlusion. No filling defects within the pulmonary arteries to suggest acute pulmonary embolism. No pericardial fluid. Calcifications of the aortic arch coronary arteries. Mediastinum/Nodes: No axillary or supraclavicular adenopathy. No mediastinal or hilar adenopathy. No pericardial fluid. Esophagus normal. Lungs/Pleura: Moderate bilateral pleural effusions. No pulmonary infarction. Mild bibasilar passive atelectasis. Musculoskeletal: No aggressive osseous lesion Review of the MIP images confirms the above findings. CT ABDOMEN and PELVIS FINDINGS Hepatobiliary: No focal hepatic lesion. Postcholecystectomy. No biliary dilatation. Liver has a lobular contour. Portal veins are patent. Large volume intraperitoneal free fluid suggest ascites. Pancreas: Pancreas is normal. No ductal dilatation. No pancreatic inflammation. Spleen: Post splenectomy Adrenals/urinary tract: Kidneys enhance symmetrically. Normal adrenal glands. Nonenhancing cysts of the LEFT kidney. Nonobstructing calculus LEFT kidney. Ureters bladder normal. Stomach/Bowel: Stomach, small-bowel and cecum are normal. The appendix is not identified but there is no pericecal inflammation to suggest appendicitis. The colon and rectosigmoid colon are normal. Vascular/Lymphatic: Abdominal aorta is normal caliber with atherosclerotic calcification. There is no retroperitoneal or periportal  lymphadenopathy. No pelvic lymphadenopathy. Reproductive: Prostate normal Other: Intraperitoneal free air. Large volume ascites. Anasarca the soft tissues. Musculoskeletal: No aggressive osseous lesion. Review of the MIP images confirms the above findings. IMPRESSION: Chest Impression: 1. No evidence acute pulmonary embolism. 2. 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 Nonocclusive. 3. Moderate bilateral pleural effusions with passive atelectasis of the lower lobes. Abdomen / Pelvis Impression: 1. Large volume of intraperitoneal free fluid in the abdomen pelvis suggest ascites. 2. Portal veins are patent.  Lobular margin of the liver. 3. Post splenectomy. 4. Anasarca the subcutaneous tissues in the abdomen pelvis. Electronically Signed   By: Genevive Bi M.D.   On: 12/15/2021 16:59   US Venous Img Lower Bilateral  Result Date: 12/15/2021 CLINICAL DATA:  Swelling on the RIGHT greater than LEFT of the lower extremities. EXAM: Bilateral LOWER EXTREMITY VENOUS DOPPLER ULTRASOUND TECHNIQUE: Gray-scale sonography with compression, as well as color and duplex ultrasound, were performed to evaluate the deep venous system(s) from the level of the common femoral vein through the popliteal and proximal calf veins. COMPARISON:  None Available. FINDINGS: VENOUS Normal compressibility of the common femoral, superficial femoral, and popliteal veins, as well as the visualized calf veins. Visualized portions of profunda femoral vein and great saphenous vein unremarkable. No filling defects to suggest DVT on grayscale or color Doppler imaging. Doppler waveforms show normal direction of venous flow, normal respiratory plasticity and response to augmentation. OTHER Signs of edema in the subcutaneous fat of the bilateral lower extremities. Limitations: none IMPRESSION: No sonographic evidence of DVT in the RIGHT or LEFT lower extremity with evidence of lower extremity edema in the  subcutaneous fat. Electronically Signed   By: Donzetta Kohut M.D.   On: 12/15/2021 17:09   CT Maxillofacial Wo Contrast  Result Date: 12/15/2021 CLINICAL DATA:  Fall, blunt facial trauma, laceration to forehead, history dementia and recurrent falls, hypertension, stroke EXAM: CT HEAD WITHOUT CONTRAST CT MAXILLOFACIAL WITHOUT CONTRAST CT CERVICAL SPINE WITHOUT CONTRAST TECHNIQUE: Multidetector CT imaging of the head, cervical spine, and maxillofacial structures were performed using the standard protocol without intravenous contrast. Multiplanar CT image reconstructions of the cervical spine  and maxillofacial structures were also generated. Right side of face marked with BB. RADIATION DOSE REDUCTION: This exam was performed according to the departmental dose-optimization program which includes automated exposure control, adjustment of the mA and/or kV according to patient size and/or use of iterative reconstruction technique. COMPARISON:  CT head 04/06/2021 FINDINGS: CT HEAD FINDINGS Brain: Generalized atrophy. Normal ventricular morphology. No midline shift or mass effect. Small vessel chronic ischemic changes of deep cerebral white matter. Small nonspecific parenchymal calcification RIGHT frontal lobe unchanged. Otherwise normal appearance of brain parenchyma. No intracranial hemorrhage, mass lesion, or evidence of acute infarction. No extra-axial fluid collections. Vascular: Mild atherosclerotic calcification of internal carotid arteries at skull base Skull: Intact Other: N/A CT MAXILLOFACIAL FINDINGS Osseous: TMJ alignment normal bilaterally. No facial bone fractures identified. Orbits: Intraorbital soft tissue planes clear Sinuses: Minimal mucosal thickening in the maxillary sinuses. Remaining paranasal sinuses, visualized mastoid air cells and middle ear cavities clear. Soft tissues: Small frontal scalp contusion. Hematoma LEFT periorbital extending to nasal region. No other facial soft tissue abnormalities.  CT CERVICAL SPINE FINDINGS Alignment: Normal Skull base and vertebrae: Osseous mineralization normal. Skull base intact. Vertebral body heights maintained. Multilevel disc space narrowing and endplate spur formation. No fracture, subluxation, or bone destruction. Minor facet degenerative changes. Soft tissues and spinal canal: Prevertebral soft tissues normal thickness. Atherosclerotic calcifications at carotid bifurcations cervical soft tissues otherwise unremarkable. Disc levels:  No specific abnormalities Upper chest: Small BILATERAL pleural effusions at lung apices greater on RIGHT. Other: N/A IMPRESSION: Atrophy with small vessel chronic ischemic changes of deep cerebral white matter. No acute intracranial abnormalities. No acute facial bone abnormalities. 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 No acute cervical spine abnormalities. Small BILATERAL pleural effusions greater on RIGHT. Electronically Signed   By: Ulyses Southward M.D.   On: 12/15/2021 15:59        Scheduled Meds:  atorvastatin  40 mg Oral Daily   donepezil  5 mg Oral QHS   escitalopram  20 mg Oral Daily   fluticasone  2 spray Each Nare Daily   losartan  100 mg Oral Daily   metoprolol succinate  25 mg Oral Daily   pantoprazole (PROTONIX) IV  40 mg Intravenous Q12H   rivaroxaban  20 mg Oral Q supper   Continuous Infusions:   LOS: 1 day      Silvano Bilis, MD [facility]   If 7PM-7AM, please contact night-coverage [email or web address] Password [email or web address] 12/16/2021, 8:22 AM

## 2021-12-16 NOTE — Assessment & Plan Note (Addendum)
Suspect from CHF will get echo but also alcoholic liver disease and hypoalbuminemia.  Cautious diuretic PRN, pt got lasix 40 mg iv x 1 now.  Electrolytes monitoring.

## 2021-12-16 NOTE — Assessment & Plan Note (Signed)
Blood pressure 114/67, pulse (!) 56, temperature 98.1 F (36.7 C), temperature source Oral, resp. rate 20, height 6\' 3"  (1.905 m), weight 114.6 kg, SpO2 91 %. Home regimen of metoprolol and losartan continued.  Amlodipine held.  Lasix held. If blood pressure overnight is still 120s or under 130 we will change metoprolol to propranolol or consider cutting that losartan to 50 mg.

## 2021-12-16 NOTE — Assessment & Plan Note (Signed)
Attribute fall due to balance issue. ? 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 We will get orthostatic vitals.  Pt also has intermittent dizziness at home that he reports and we will get PT consult.

## 2021-12-16 NOTE — TOC Initial Note (Signed)
Transition of Care [facility]) - Initial/Assessment Note    Patient Details  Name: Craig [name] MRN: [id number] Date of Birth: 1941/03/19  Transition of Care [facility]) CM/SW Contact:    Truddie Hidden, RN Phone Number: 12/16/2021, 3:21 PM  Clinical Narrative:                  Transition of Care [facility]) Screening Note   Patient Details  Name: Craig [name] Date of Birth: [birth year]   Transition of [facility]) CM/SW Contact:    Truddie Hidden, RN Phone Number: 12/16/2021, 3:21 PM    Transition of Care Department [facility]) has reviewed patient and no TOC needs have been identified at this time. We will continue to monitor patient advancement through interdisciplinary progression rounds. If new patient transition needs arise, please place a TOC consult.          Patient Goals and CMS Choice        Expected Discharge Plan and Services                                                Prior Living Arrangements/Services                       Activities of Daily Living   ADL Screening (condition at time of admission) Patient's cognitive ability adequate to safely complete daily activities?: Yes Is the patient deaf or have difficulty hearing?: Yes Does the patient have difficulty seeing, even when wearing glasses/contacts?: No Does the patient have difficulty concentrating, remembering, or making decisions?: No Patient able to express need for assistance with ADLs?: Yes Does the patient have difficulty dressing or bathing?: No Independently performs ADLs?: Yes (appropriate for developmental age) Does the patient have difficulty walking or climbing stairs?: Yes Weakness of Legs: None Weakness of Arms/Hands: None  Permission Sought/Granted                  Emotional Assessment              Admission diagnosis:  Anasarca [R60.1] Acute respiratory failure with hypoxia (HCC) [J96.01] Falls [W19.XXXA] Fall, initial encounter [id number]] Laceration of  scalp, initial encounter [S01.01XA] Patient Active Problem List   Diagnosis Date Noted   SOB (shortness of breath) 12/16/2021   Anasarca 12/16/2021   Ascites 12/16/2021   Acute respiratory failure with hypoxia (HCC) 12/16/2021   Falls 12/15/2021   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   Lymphedema of both lower extremities 05/17/2017   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 [place] [address] Phone: [phone] Fax: [phone]  [facility] - [place], [place] - [address] [place] [address] Phone: [phone] Fax: [phone]     Social Determinants of Health (SDOH)  Interventions    Readmission Risk Interventions     View : No data to display.

## 2021-12-16 NOTE — Progress Notes (Signed)
*  PRELIMINARY RESULTS* Echocardiogram 2D Echocardiogram has been performed.  Craig [name] 12/16/2021, 2:37 PM

## 2021-12-17 ENCOUNTER — Inpatient Hospital Stay: Payer: Medicare PPO

## 2021-12-17 DIAGNOSIS — I10 Essential (primary) hypertension: Secondary | ICD-10-CM | POA: Diagnosis not present

## 2021-12-17 DIAGNOSIS — W19XXXA Unspecified fall, initial encounter: Secondary | ICD-10-CM | POA: Diagnosis not present

## 2021-12-17 LAB — BODY FLUID CELL COUNT WITH DIFFERENTIAL
Eos, Fluid: 0 %
Lymphs, Fluid: 84 %
Monocyte-Macrophage-Serous Fluid: 16 %
Neutrophil Count, Fluid: 0 %
Total Nucleated Cell Count, Fluid: 812 uL

## 2021-12-17 LAB — PROTEIN, PLEURAL OR PERITONEAL FLUID: Total protein, fluid: <3 g/dL

## 2021-12-17 LAB — CBC
HCT: 35.3 % — ABNORMAL LOW (ref 39.0–52.0)
Hemoglobin: 12 g/dL — ABNORMAL LOW (ref 13.0–17.0)
MCH: 30.5 pg (ref 26.0–34.0)
MCHC: 34 g/dL (ref 30.0–36.0)
MCV: 89.8 fL (ref 80.0–100.0)
Platelets: 201 10*3/uL (ref 150–400)
RBC: 3.93 MIL/uL — ABNORMAL LOW (ref 4.22–5.81)
RDW: 20.9 % — ABNORMAL HIGH (ref 11.5–15.5)
WBC: 3.6 10*3/uL — ABNORMAL LOW (ref 4.0–10.5)
nRBC: 0 % (ref 0.0–0.2)

## 2021-12-17 LAB — LACTATE DEHYDROGENASE, PLEURAL OR PERITONEAL FLUID: LD, Fluid: 106 U/L — ABNORMAL HIGH (ref 3–23)

## 2021-12-17 LAB — COMPREHENSIVE METABOLIC PANEL
ALT: 7 U/L (ref 0–44)
AST: 18 U/L (ref 15–41)
Albumin: 2.9 g/dL — ABNORMAL LOW (ref 3.5–5.0)
Alkaline Phosphatase: 54 U/L (ref 38–126)
Anion gap: 6 (ref 5–15)
BUN: 11 mg/dL (ref 8–23)
CO2: 36 mmol/L — ABNORMAL HIGH (ref 22–32)
Calcium: 8.8 mg/dL — ABNORMAL LOW (ref 8.9–10.3)
Chloride: 97 mmol/L — ABNORMAL LOW (ref 98–111)
Creatinine, Ser: 0.7 mg/dL (ref 0.61–1.24)
GFR, Estimated: >60 mL/min (ref >60)
Glucose, Bld: 93 mg/dL (ref 70–99)
Potassium: 3.3 mmol/L — ABNORMAL LOW (ref 3.5–5.1)
Sodium: 139 mmol/L (ref 135–145)
Total Bilirubin: 1.4 mg/dL — ABNORMAL HIGH (ref 0.3–1.2)
Total Protein: 5.6 g/dL — ABNORMAL LOW (ref 6.5–8.1)

## 2021-12-17 LAB — GLUCOSE, PLEURAL OR PERITONEAL FLUID: Glucose, Fluid: 101 mg/dL

## 2021-12-17 LAB — MAGNESIUM: Magnesium: 1.7 mg/dL (ref 1.7–2.4)

## 2021-12-17 LAB — HCV INTERPRETATION

## 2021-12-17 LAB — HEPATITIS B SURFACE ANTIBODY, QUANTITATIVE: Hep B S AB Quant (Post): 30 m[IU]/mL (ref >9.9)

## 2021-12-17 LAB — HCV AB W REFLEX TO QUANT PCR: HCV Ab: NONREACTIVE

## 2021-12-17 MED ORDER — POTASSIUM CHLORIDE CRYS ER 20 MEQ PO TBCR
40.0000 meq | EXTENDED_RELEASE_TABLET | Freq: Once | ORAL | Status: AC
  Administered 2021-12-17: 40 meq via ORAL
  Filled 2021-12-17: qty 2

## 2021-12-17 MED ORDER — RIVAROXABAN 20 MG PO TABS
20.0000 mg | ORAL_TABLET | Freq: Every day | ORAL | Status: DC
Start: 2021-12-18 — End: 2021-12-18

## 2021-12-17 MED ORDER — MAGNESIUM SULFATE IN D5W 1-5 GM/100ML-% IV SOLN
1.0000 g | Freq: Once | INTRAVENOUS | Status: AC
  Administered 2021-12-17: 1 g via INTRAVENOUS
  Filled 2021-12-17: qty 100

## 2021-12-17 NOTE — Progress Notes (Signed)
PT Cancellation Note  Patient Details Name: Craig [name] MRN: [id number] DOB: 1941-06-29   Cancelled Treatment:    Reason Eval/Treat Not Completed: Patient at procedure or test/unavailable PT orders received, chart reviewed. Attempted to see pt but pt out of room. Visitors present reporting pt has gone "to get more fluid taken off". Will f/u as able & as pt is available.  Aleda Grana, PT, DPT 12/17/21, 2:50 PM   Sandi Mariscal 12/17/2021, 2:49 PM

## 2021-12-17 NOTE — Progress Notes (Signed)
PROGRESS NOTE    Craig [name]  ZOX:[id number] DOB: 1940/11/10 DOA: 12/15/2021 PCP: Smitty Cords, DO  Outpatient Specialists: cardiology, vascular surgery    Brief Narrative:  From admission h and p Craig [name] is a 81 y.o. male with medical history significant of anxiety, hypertension, pulmonary embolism, DVTs, TTP, stroke syndrome presenting today with fall this morning.  Patient reported shortness of breath and swelling of his legs.  Patient does not give any history and is not aware of any history of cirrhosis however imaging does have some ascites which I suspect is from the alcohol.  Patient drinks on a daily basis mixed drink with vodka.  Wife and family at bedside do not report any withdrawals or any concerns.  Patient was taking Xarelto for his pulmonary embolism and there was a period where he was taken off of Xarelto after being on it for quite a while and was restarted.  He has a history of tobacco.  Per wife patient has no history of alcohol withdrawal or seizures or shakes. Patient reports chest pain, shortness of breath, edema affecting his leg thighs and his abdomen.  Patient did not know that he has ascites and has no history of cirrhosis that he knows of.  Per wife patient's never had any withdrawals shakes DTs or seizures in relation to alcohol.  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 Bnp is elevated and albumin is normal so chf is also in ddx, though TTE is without significant abnormalities. No abd tenderness to suggest sbp and culture is NGTD. Heavy drinking the likely culprit. Platelets normal. Albumin mildly low. Inr elevated though on doac. Portal system patent on CT. 2.3 L paracentesis on 5/26, labs  consistent w/ portal hypertension. Hep b/c negative. No lesions seen on RUQ u/s. No proteinuria on urinalysis. - continue lasix/spironolactone - will touch base w/ IR about performing diagnostic/therapeutic paracentesis  # Acute hypoxic respiratory failure Breathing comfortably on 3 L but drops to low 80s off it. No PE or focal consolidation on CTA. Does have b/l effusions, b/l atelectasis, and findings of chronic pe.  - IR thoracentesis as above - diuresis as above - cont o2 - incentive spirometry - very possibly may need to d/c on o2 given his b/l atelectasis that won't likely quickly resolve  # Falls With bruise left eye. No fracture or other acute pathology on ct of face/head/neck - PT consult  # Hypokalemia Replete as needed  # HTN Here bp wnl - cont home metop - hold home losart to allow room for diuresis  # History PE No actute PE or DVT on imaging - cont home xarelto  # Dementia - cont home aricept  # Alcohol use Reports one drink/day currently, previous heavy drinker - monitor for signs withdrawal but assuming his reported consumption is accurate unlikely to withdraw  # Lymphedema Followed by vascular, plan for lymphedema pump, though quite possibly won't prove necessary as primary cause of edema likely his cirrhosis   DVT prophylaxis: home xarelto Code Status: full Family Communication: significant other updated @ bedside 5/27  Level of care: Telemetry Cardiac Status is: Inpatient Remains inpatient appropriate because: severity of illness    Consultants:  none  Procedures:  none  Antimicrobials:  none    Subjective: Feels well. No chest pain or sob.  Objective: Vitals:   12/16/21 2335 12/17/21 0333 12/17/21 0726 12/17/21 1121  BP: 117/78 121/68 (!) 109/57 116/61  Pulse: 83 64 (!) 50 (!) 57  Resp:  16 20 19   Temp: 98.1 F (36.7 C) 98.9 F (37.2 C) 98 F (36.7 C) 98.6 F (37 C)  TempSrc:  Oral  Oral  SpO2: 94% 90% 92% 96%  Weight:       Height:        Intake/Output Summary (Last 24 hours) at 12/17/2021 1134 Last data filed at 12/16/2021 1920 Gross per 24 hour  Intake 360 ml  Output 175 ml  Net 185 ml   Filed Weights   12/15/21 1405  Weight: 114.6 kg    Examination:  General exam: Appears calm and comfortable  Respiratory system: rales and decreased breaths ounds at bases Cardiovascular system: S1 & S2 heard, RRR. No JVD, murmurs, rubs, gallops or clicks.  Gastrointestinal system: Abdomen is not distended, is soft and nontender. No organomegaly or masses felt. Normal bowel sounds heard. Central nervous system: Alert and oriented. No focal neurological deficits. Extremities: Symmetric 5 x 5 power. Skin: bruise left eye. Pitting edema right leg greater than left Psychiatry: appears a bit confused    Data Reviewed: I have personally reviewed following labs and imaging studies  CBC: Recent Labs  Lab 12/15/21 1416 12/16/21 0506 12/17/21 0503  WBC 5.2 5.1 3.6*  HGB 13.6 12.6* 12.0*  HCT 40.9 37.2* 35.3*  MCV 93.0 91.4 89.8  PLT 226 218 201   Basic Metabolic Panel: Recent Labs  Lab 12/15/21 1416 12/16/21 0506 12/17/21 0503  NA 139 140 139  K 4.3 3.5 3.3*  CL 102 99 97*  CO2 32 34* 36*  GLUCOSE 99 83 93  BUN 15 14 11   CREATININE 0.93 0.81 0.70  CALCIUM 9.2 9.1 8.8*   GFR: Estimated Creatinine Clearance: 102.2 mL/min (by C-G formula based on SCr of 0.7 mg/dL). Liver Function Tests: Recent Labs  Lab 12/16/21 0506 12/17/21 0503  AST 18 18  ALT 8 7  ALKPHOS 59 54  BILITOT 1.4* 1.4*  PROT 6.5 5.6*  ALBUMIN 3.3* 2.9*   No results for input(s): LIPASE, AMYLASE in the last 168 hours. No results for input(s): AMMONIA in the last 168 hours. Coagulation Profile: Recent Labs  Lab 12/15/21 1416  INR 2.2*   Cardiac Enzymes: No results for input(s): CKTOTAL, CKMB, CKMBINDEX, TROPONINI in the last 168 hours. BNP (last 3 results) No results for input(s): PROBNP in the last 8760  hours. HbA1C: No results for input(s): HGBA1C in the last 72 hours. CBG: Recent Labs  Lab 12/16/21 2105  GLUCAP 175*   Lipid Profile: No results for input(s): CHOL, HDL, LDLCALC, TRIG, CHOLHDL, LDLDIRECT in the last 72 hours. Thyroid Function Tests: No results for input(s): TSH, T4TOTAL, FREET4, T3FREE, THYROIDAB in the last 72 hours. Anemia Panel: Recent Labs    12/16/21 1345  TIBC 399  IRON 37*   Urine analysis:    Component Value Date/Time   COLORURINE YELLOW (A) 12/16/2021 0832   APPEARANCEUR CLEAR (A) 12/16/2021 0832   APPEARANCEUR Cloudy (A) 04/15/2015 1031   LABSPEC 1.020 12/16/2021 0832   PHURINE 6.0 12/16/2021 0832   GLUCOSEU NEGATIVE 12/16/2021 0832   HGBUR SMALL (A) 12/16/2021 0832   BILIRUBINUR NEGATIVE 12/16/2021 0832   BILIRUBINUR Negative 04/15/2015 1031   KETONESUR NEGATIVE 12/16/2021 0832   PROTEINUR NEGATIVE 12/16/2021  1610   NITRITE NEGATIVE 12/16/2021 0832   LEUKOCYTESUR TRACE (A) 12/16/2021 0832   Sepsis Labs: (procalcitonin:4,lacticidven:4)  ) Recent Results (from the past 240 hour(s))  Resp Panel by RT-PCR (Flu A&B, Covid) Anterior Nasal Swab     Status: None   Collection Time: 12/15/21  2:46 PM   Specimen: Anterior Nasal Swab  Result Value Ref Range Status   SARS Coronavirus 2 by RT PCR NEGATIVE NEGATIVE Final    Comment: (NOTE) SARS-CoV-2 target nucleic acids are NOT DETECTED.  The SARS-CoV-2 RNA is generally detectable in upper respiratory specimens during the acute phase of infection. The lowest concentration of SARS-CoV-2 viral copies this assay can detect is 138 copies/mL. A negative result does not preclude SARS-Cov-2 infection and should not be used as the sole basis for treatment or other patient management decisions. A negative result may occur with  improper specimen collection/handling, submission of specimen other than nasopharyngeal swab, presence of viral mutation(s) within the areas targeted by this assay, and  inadequate number of viral copies(<138 copies/mL). A negative result must be combined with clinical observations, patient history, and epidemiological information. The expected result is Negative.  Fact Sheet for Patients:  BloggerCourse.com  Fact Sheet for Healthcare Providers:  SeriousBroker.it  This test is no t yet approved or cleared by the [place] FDA and  has been authorized for detection and/or diagnosis of SARS-CoV-2 by FDA under an Emergency Use Authorization (EUA). This EUA will remain  in effect (meaning this test can be used) for the duration of the COVID-19 declaration under Section 564(b)(1) of the Act, 21 U.S.C.section 360bbb-3(b)(1), unless the authorization is terminated  or revoked sooner.       Influenza A by PCR NEGATIVE NEGATIVE Final   Influenza B by PCR NEGATIVE NEGATIVE Final    Comment: (NOTE) The Xpert Xpress SARS-CoV-2/FLU/RSV plus assay is intended as an aid in the diagnosis of influenza from Nasopharyngeal swab specimens and should not be used as a sole basis for treatment. Nasal washings and aspirates are unacceptable for Xpert Xpress SARS-CoV-2/FLU/RSV testing.  Fact Sheet for Patients: BloggerCourse.com  Fact Sheet for Healthcare Providers: SeriousBroker.it  This test is not yet approved or cleared by the [place] FDA and has been authorized for detection and/or diagnosis of SARS-CoV-2 by FDA under an Emergency Use Authorization (EUA). This EUA will remain in effect (meaning this test can be used) for the duration of the COVID-19 declaration under Section 564(b)(1) of the Act, 21 U.S.C. section 360bbb-3(b)(1), unless the authorization is terminated or revoked.  Performed at [facility], [address] [place], [place] [address]   Body fluid culture w Gram Stain     Status: None (Preliminary result)   Collection Time:  12/16/21 10:24 AM   Specimen: PATH Cytology Peritoneal fluid  Result Value Ref Range Status   Specimen Description   Final    PERITONEAL Performed at [facility], [address] [place], [place] [address]    Special Requests   Final    PERITONEAL Performed at [facility], [address] [place], [place] [address]    Gram Stain   Final    RARE WBC PRESENT, PREDOMINANTLY PMN NO ORGANISMS SEEN    Culture   Final    NO GROWTH < 24 HOURS Performed at [facility], [address], [place], [place] [address]    Report Status PENDING  Incomplete         Radiology Studies: DG Chest 2  View  Result Date: 12/15/2021 CLINICAL DATA:  Shortness of breath, fall EXAM: CHEST - 2 VIEW COMPARISON:  Previous studies including the examination of 04/06/2021 FINDINGS: Transverse diameter of heart is increased. There are no signs of alveolar pulmonary edema. Increased density is seen in the medial left lower lung fields. Increased markings are seen in the right lower lung fields. Costophrenic angles are clear. There is no pneumothorax. IMPRESSION: Increased density in the medial left lower lung fields may suggest atelectasis/pneumonia. Increased interstitial markings in the right lower lung fields may suggest scarring and/or atelectasis/pneumonia. There are no signs of alveolar pulmonary edema. Electronically Signed   By: Ernie Avena M.D.   On: 12/15/2021 16:01   CT Head Wo Contrast  Result Date: 12/15/2021 CLINICAL DATA:  Fall, blunt facial trauma, laceration to forehead, history dementia and recurrent falls, hypertension, stroke EXAM: CT HEAD WITHOUT CONTRAST CT MAXILLOFACIAL WITHOUT CONTRAST CT CERVICAL SPINE WITHOUT CONTRAST TECHNIQUE: Multidetector CT imaging of the head, cervical spine, and maxillofacial structures were performed using the standard protocol without intravenous contrast. Multiplanar CT image reconstructions of the cervical spine and maxillofacial  structures were also generated. Right side of face marked with BB. RADIATION DOSE REDUCTION: This exam was performed according to the departmental dose-optimization program which includes automated exposure control, adjustment of the mA and/or kV according to patient size and/or use of iterative reconstruction technique. COMPARISON:  CT head 04/06/2021 FINDINGS: CT HEAD FINDINGS Brain: Generalized atrophy. Normal ventricular morphology. No midline shift or mass effect. Small vessel chronic ischemic changes of deep cerebral white matter. Small nonspecific parenchymal calcification RIGHT frontal lobe unchanged. Otherwise normal appearance of brain parenchyma. No intracranial hemorrhage, mass lesion, or evidence of acute infarction. No extra-axial fluid collections. Vascular: Mild atherosclerotic calcification of internal carotid arteries at skull base Skull: Intact Other: N/A CT MAXILLOFACIAL FINDINGS Osseous: TMJ alignment normal bilaterally. No facial bone fractures identified. Orbits: Intraorbital soft tissue planes clear Sinuses: Minimal mucosal thickening in the maxillary sinuses. Remaining paranasal sinuses, visualized mastoid air cells and middle ear cavities clear. Soft tissues: Small frontal scalp contusion. Hematoma LEFT periorbital extending to nasal region. No other facial soft tissue abnormalities. CT CERVICAL SPINE FINDINGS Alignment: Normal Skull base and vertebrae: Osseous mineralization normal. Skull base intact. Vertebral body heights maintained. Multilevel disc space narrowing and endplate spur formation. No fracture, subluxation, or bone destruction. Minor facet degenerative changes. Soft tissues and spinal canal: Prevertebral soft tissues normal thickness. Atherosclerotic calcifications at carotid bifurcations cervical soft tissues otherwise unremarkable. Disc levels:  No specific abnormalities Upper chest: Small BILATERAL pleural effusions at lung apices greater on RIGHT. Other: N/A IMPRESSION:  Atrophy with small vessel chronic ischemic changes of deep cerebral white matter. No acute intracranial abnormalities. No acute facial bone abnormalities. 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 No acute cervical spine abnormalities. Small BILATERAL pleural effusions greater on RIGHT. Electronically Signed   By: Ulyses Southward M.D.   On: 12/15/2021 15:59   CT Angio Chest PE W and/or Wo Contrast  Result Date: 12/15/2021 CLINICAL DATA:  Concern for pulmonary embolism. Several falls recently. EXAM: CT ANGIOGRAPHY CHEST CT ABDOMEN AND PELVIS WITH CONTRAST TECHNIQUE: Multidetector CT imaging of the chest was performed using the standard protocol during bolus administration of intravenous contrast. Multiplanar CT image reconstructions and MIPs were obtained to evaluate the vascular anatomy. Multidetector CT imaging of the abdomen and pelvis was performed using the standard protocol during bolus administration of intravenous contrast. RADIATION DOSE REDUCTION: This exam was performed  according to the departmental dose-optimization program which includes automated exposure control, adjustment of the mA and/or kV according to patient size and/or use of iterative reconstruction technique. CONTRAST:  100mL OMNIPAQUE IOHEXOL 350 MG/ML SOLN COMPARISON:  None Available. FINDINGS: CTA CHEST FINDINGS Cardiovascular: There is a thin linear filling defect within the RIGHT lower lobe pulmonary artery. This very thin curvilinear defect is favored residual synechia from remodeled remote thromboembolus. No occlusion. No filling defects within the pulmonary arteries to suggest acute pulmonary embolism. No pericardial fluid. Calcifications of the aortic arch coronary arteries. Mediastinum/Nodes: No axillary or supraclavicular adenopathy. No mediastinal or hilar adenopathy. No pericardial fluid. Esophagus normal. Lungs/Pleura: Moderate bilateral pleural effusions. No pulmonary infarction. Mild  bibasilar passive atelectasis. Musculoskeletal: No aggressive osseous lesion Review of the MIP images confirms the above findings. CT ABDOMEN and PELVIS FINDINGS Hepatobiliary: No focal hepatic lesion. Postcholecystectomy. No biliary dilatation. Liver has a lobular contour. Portal veins are patent. Large volume intraperitoneal free fluid suggest ascites. Pancreas: Pancreas is normal. No ductal dilatation. No pancreatic inflammation. Spleen: Post splenectomy Adrenals/urinary tract: Kidneys enhance symmetrically. Normal adrenal glands. Nonenhancing cysts of the LEFT kidney. Nonobstructing calculus LEFT kidney. Ureters bladder normal. Stomach/Bowel: Stomach, small-bowel and cecum are normal. The appendix is not identified but there is no pericecal inflammation to suggest appendicitis. The colon and rectosigmoid colon are normal. Vascular/Lymphatic: Abdominal aorta is normal caliber with atherosclerotic calcification. There is no retroperitoneal or periportal lymphadenopathy. No pelvic lymphadenopathy. Reproductive: Prostate normal Other: Intraperitoneal free air. Large volume ascites. Anasarca the soft tissues. Musculoskeletal: No aggressive osseous lesion. Review of the MIP images confirms the above findings. IMPRESSION: Chest Impression: 1. No evidence acute pulmonary embolism. 2. 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 Nonocclusive. 3. Moderate bilateral pleural effusions with passive atelectasis of the lower lobes. Abdomen / Pelvis Impression: 1. Large volume of intraperitoneal free fluid in the abdomen pelvis suggest ascites. 2. Portal veins are patent.  Lobular margin of the liver. 3. Post splenectomy. 4. Anasarca the subcutaneous tissues in the abdomen pelvis. Electronically Signed   By: Genevive BiStewart  Edmunds M.D.   On: 12/15/2021 16:59   CT Cervical Spine Wo Contrast  Result Date: 12/15/2021 CLINICAL DATA:  Fall, blunt facial trauma, laceration to forehead, history dementia  and recurrent falls, hypertension, stroke EXAM: CT HEAD WITHOUT CONTRAST CT MAXILLOFACIAL WITHOUT CONTRAST CT CERVICAL SPINE WITHOUT CONTRAST TECHNIQUE: Multidetector CT imaging of the head, cervical spine, and maxillofacial structures were performed using the standard protocol without intravenous contrast. Multiplanar CT image reconstructions of the cervical spine and maxillofacial structures were also generated. Right side of face marked with BB. RADIATION DOSE REDUCTION: This exam was performed according to the departmental dose-optimization program which includes automated exposure control, adjustment of the mA and/or kV according to patient size and/or use of iterative reconstruction technique. COMPARISON:  CT head 04/06/2021 FINDINGS: CT HEAD FINDINGS Brain: Generalized atrophy. Normal ventricular morphology. No midline shift or mass effect. Small vessel chronic ischemic changes of deep cerebral white matter. Small nonspecific parenchymal calcification RIGHT frontal lobe unchanged. Otherwise normal appearance of brain parenchyma. No intracranial hemorrhage, mass lesion, or evidence of acute infarction. No extra-axial fluid collections. Vascular: Mild atherosclerotic calcification of internal carotid arteries at skull base Skull: Intact Other: N/A CT MAXILLOFACIAL FINDINGS Osseous: TMJ alignment normal bilaterally. No facial bone fractures identified. Orbits: Intraorbital soft tissue planes clear Sinuses: Minimal mucosal thickening in the maxillary sinuses. Remaining paranasal sinuses, visualized mastoid air cells and middle ear cavities clear. Soft tissues: Small  frontal scalp contusion. Hematoma LEFT periorbital extending to nasal region. No other facial soft tissue abnormalities. CT CERVICAL SPINE FINDINGS Alignment: Normal Skull base and vertebrae: Osseous mineralization normal. Skull base intact. Vertebral body heights maintained. Multilevel disc space narrowing and endplate spur formation. No fracture,  subluxation, or bone destruction. Minor facet degenerative changes. Soft tissues and spinal canal: Prevertebral soft tissues normal thickness. Atherosclerotic calcifications at carotid bifurcations cervical soft tissues otherwise unremarkable. Disc levels:  No specific abnormalities Upper chest: Small BILATERAL pleural effusions at lung apices greater on RIGHT. Other: N/A IMPRESSION: Atrophy with small vessel chronic ischemic changes of deep cerebral white matter. No acute intracranial abnormalities. No acute facial bone abnormalities. 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 No acute cervical spine abnormalities. Small BILATERAL pleural effusions greater on RIGHT. Electronically Signed   By: Ulyses Southward M.D.   On: 12/15/2021 15:59   CT ABDOMEN PELVIS W CONTRAST  Result Date: 12/15/2021 CLINICAL DATA:  Concern for pulmonary embolism. Several falls recently. EXAM: CT ANGIOGRAPHY CHEST CT ABDOMEN AND PELVIS WITH CONTRAST TECHNIQUE: Multidetector CT imaging of the chest was performed using the standard protocol during bolus administration of intravenous contrast. Multiplanar CT image reconstructions and MIPs were obtained to evaluate the vascular anatomy. Multidetector CT imaging of the abdomen and pelvis was performed using the standard protocol during bolus administration of intravenous contrast. RADIATION DOSE REDUCTION: This exam was performed according to the departmental dose-optimization program which includes automated exposure control, adjustment of the mA and/or kV according to patient size and/or use of iterative reconstruction technique. CONTRAST:  OMNIPAQUE IOHEXOL 350 MG/ML SOLN COMPARISON:  None Available. FINDINGS: CTA CHEST FINDINGS Cardiovascular: There is a thin linear filling defect within the RIGHT lower lobe pulmonary artery. This very thin curvilinear defect is favored residual synechia from remodeled remote thromboembolus. No occlusion. No filling  defects within the pulmonary arteries to suggest acute pulmonary embolism. No pericardial fluid. Calcifications of the aortic arch coronary arteries. Mediastinum/Nodes: No axillary or supraclavicular adenopathy. No mediastinal or hilar adenopathy. No pericardial fluid. Esophagus normal. Lungs/Pleura: Moderate bilateral pleural effusions. No pulmonary infarction. Mild bibasilar passive atelectasis. Musculoskeletal: No aggressive osseous lesion Review of the MIP images confirms the above findings. CT ABDOMEN and PELVIS FINDINGS Hepatobiliary: No focal hepatic lesion. Postcholecystectomy. No biliary dilatation. Liver has a lobular contour. Portal veins are patent. Large volume intraperitoneal free fluid suggest ascites. Pancreas: Pancreas is normal. No ductal dilatation. No pancreatic inflammation. Spleen: Post splenectomy Adrenals/urinary tract: Kidneys enhance symmetrically. Normal adrenal glands. Nonenhancing cysts of the LEFT kidney. Nonobstructing calculus LEFT kidney. Ureters bladder normal. Stomach/Bowel: Stomach, small-bowel and cecum are normal. The appendix is not identified but there is no pericecal inflammation to suggest appendicitis. The colon and rectosigmoid colon are normal. Vascular/Lymphatic: Abdominal aorta is normal caliber with atherosclerotic calcification. There is no retroperitoneal or periportal lymphadenopathy. No pelvic lymphadenopathy. Reproductive: Prostate normal Other: Intraperitoneal free air. Large volume ascites. Anasarca the soft tissues. Musculoskeletal: No aggressive osseous lesion. Review of the MIP images confirms the above findings. IMPRESSION: Chest Impression: 1. No evidence acute pulmonary embolism. 2. 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 Nonocclusive. 3. Moderate bilateral pleural effusions with passive atelectasis of the lower lobes. Abdomen / Pelvis Impression: 1. Large volume of intraperitoneal free fluid in the abdomen pelvis  suggest ascites. 2. Portal veins are patent.  Lobular margin of the liver. 3. Post splenectomy. 4. Anasarca the subcutaneous tissues in the abdomen pelvis. Electronically Signed  By: Genevive Bi M.D.   On: 12/15/2021 16:59   US Venous Img Lower Bilateral  Result Date: 12/15/2021 CLINICAL DATA:  Swelling on the RIGHT greater than LEFT of the lower extremities. EXAM: Bilateral LOWER EXTREMITY VENOUS DOPPLER ULTRASOUND TECHNIQUE: Gray-scale sonography with compression, as well as color and duplex ultrasound, were performed to evaluate the deep venous system(s) from the level of the common femoral vein through the popliteal and proximal calf veins. COMPARISON:  None Available. FINDINGS: VENOUS Normal compressibility of the common femoral, superficial femoral, and popliteal veins, as well as the visualized calf veins. Visualized portions of profunda femoral vein and great saphenous vein unremarkable. No filling defects to suggest DVT on grayscale or color Doppler imaging. Doppler waveforms show normal direction of venous flow, normal respiratory plasticity and response to augmentation. OTHER Signs of edema in the subcutaneous fat of the bilateral lower extremities. Limitations: none IMPRESSION: No sonographic evidence of DVT in the RIGHT or LEFT lower extremity with evidence of lower extremity edema in the subcutaneous fat. Electronically Signed   By: Donzetta Kohut M.D.   On: 12/15/2021 17:09   US Paracentesis  Result Date: 12/16/2021 INDICATION: Patient with history of daily ETOH use who fell at home today, CT shows new onset ascites. Request for diagnostic and therapeutic paracentesis. EXAM: ULTRASOUND GUIDED DIAGNOSTIC AND THERAPEUTIC PARACENTESIS MEDICATIONS: 8 mL 1% lidocaine COMPLICATIONS: None immediate. PROCEDURE: Informed written consent was obtained from the patient after a discussion of the risks, benefits and alternatives to treatment. A timeout was performed prior to the initiation of the  procedure. Initial ultrasound scanning demonstrates a large amount of ascites within the right upper abdominal quadrant. The right upper abdomen was prepped and draped in the usual sterile fashion. 1% lidocaine was used for local anesthesia. Following this, a 6 Fr Safe-T-Centesis catheter was introduced. An ultrasound image was saved for documentation purposes. The paracentesis was performed. The catheter was removed and a dressing was applied. The patient tolerated the procedure well without immediate post procedural complication. FINDINGS: A total of approximately 2.8 L of clear yellow fluid was removed. Samples were sent to the laboratory as requested by the clinical team. IMPRESSION: Successful ultrasound-guided paracentesis yielding 2.8 liters of peritoneal fluid. Read by Lynnette Caffey, PA-C Electronically Signed   By: Olive Bass M.D.   On: 12/16/2021 11:57   ECHOCARDIOGRAM COMPLETE  Result Date: 12/16/2021    ECHOCARDIOGRAM REPORT   Patient Name:   Craig [name] Date of Exam: 12/16/2021 Medical Rec #:  [id number]     Height:       75.0 in Accession #:    [id number]    Weight:       252.6 lb Date of Birth:  [birth year]      BSA:          2.423 m Patient Age:    80 years      BP:           117/69 mmHg Patient Gender: M             HR:           60 bpm. Exam Location:  [facility] Procedure: 2D Echo, Cardiac Doppler and Color Doppler Indications:     CHF-acute systolic I50.21  History:         Patient has prior history of Echocardiogram examinations, most                  recent 05/25/2017. Stroke; Risk Factors:Hypertension. Pulmonary  emboli.  Sonographer:     Cristela Blue Referring Phys:  [id number] Eliezer Mccoy PATEL Diagnosing Phys: Julien Nordmann MD  Sonographer Comments: Suboptimal apical window. IMPRESSIONS  1. Left ventricular ejection fraction, by estimation, is 60 to 65%. The left ventricle has normal function. The left ventricle has no regional wall motion abnormalities. Left ventricular diastolic  parameters are indeterminate.  2. Right ventricular systolic function is normal. The right ventricular size is moderately enlarged. There is normal pulmonary artery systolic pressure. The estimated right ventricular systolic pressure is 24.5 mmHg.  3. Left atrial size was severely dilated.  4. Right atrial size was severely dilated.  5. Large pleural effusion in the left lateral region.  6. The mitral valve is normal in structure. Mild mitral valve regurgitation. No evidence of mitral stenosis.  7. The aortic valve is normal in structure. There is moderate calcification of the aortic valve. Aortic valve regurgitation is not visualized. No aortic stenosis is present.  8. The inferior vena cava is normal in size with <50% respiratory variability, suggesting right atrial pressure of 8 mmHg. FINDINGS  Left Ventricle: Left ventricular ejection fraction, by estimation, is 60 to 65%. The left ventricle has normal function. The left ventricle has no regional wall motion abnormalities. The left ventricular internal cavity size was normal in size. There is  no left ventricular hypertrophy. Left ventricular diastolic parameters are indeterminate. Right Ventricle: The right ventricular size is moderately enlarged. No increase in right ventricular wall thickness. Right ventricular systolic function is normal. There is normal pulmonary artery systolic pressure. The tricuspid regurgitant velocity is 2.21 m/s, and with an assumed right atrial pressure of 5 mmHg, the estimated right ventricular systolic pressure is 24.5 mmHg. Left Atrium: Left atrial size was severely dilated. Right Atrium: Right atrial size was severely dilated. Pericardium: There is no evidence of pericardial effusion. Mitral Valve: The mitral valve is normal in structure. Mild mitral valve regurgitation. No evidence of mitral valve stenosis. Tricuspid Valve: The tricuspid valve is normal in structure. Tricuspid valve regurgitation is mild . No evidence of  tricuspid stenosis. Aortic Valve: The aortic valve is normal in structure. There is moderate calcification of the aortic valve. Aortic valve regurgitation is not visualized. No aortic stenosis is present. Aortic valve mean gradient measures 2.0 mmHg. Aortic valve peak gradient measures 4.0 mmHg. Aortic valve area, by VTI measures 3.01 cm. Pulmonic Valve: The pulmonic valve was normal in structure. Pulmonic valve regurgitation is not visualized. No evidence of pulmonic stenosis. Aorta: The aortic root is normal in size and structure. Venous: The inferior vena cava is normal in size with less than 50% respiratory variability, suggesting right atrial pressure of 8 mmHg. IAS/Shunts: No atrial level shunt detected by color flow Doppler. Additional Comments: There is a large pleural effusion in the left lateral region.  LEFT VENTRICLE PLAX 2D LVIDd:         5.60 cm LVIDs:         3.80 cm LV PW:         1.30 cm LV IVS:        0.80 cm LVOT diam:     2.00 cm LV SV:         56 LV SV Index:   23 LVOT Area:     3.14 cm  LEFT ATRIUM              Index        RIGHT ATRIUM  Index LA diam:        5.60 cm  2.31 cm/m   RA Area:     43.30 cm LA Vol (A2C):   203.0 ml 83.77 ml/m  RA Volume:   184.00 ml 75.93 ml/m LA Vol (A4C):   141.0 ml 58.19 ml/m LA Biplane Vol: 170.0 ml 70.15 ml/m  AORTIC VALVE                    PULMONIC VALVE AV Area (Vmax):    2.95 cm     PV Vmax:        0.87 m/s AV Area (Vmean):   2.91 cm     PV Vmean:       59.300 cm/s AV Area (VTI):     3.01 cm     PV VTI:         0.155 m AV Vmax:           100.00 cm/s  PV Peak grad:   3.0 mmHg AV Vmean:          62.700 cm/s  PV Mean grad:   2.0 mmHg AV VTI:            0.185 m      RVOT Peak grad: 4 mmHg AV Peak Grad:      4.0 mmHg AV Mean Grad:      2.0 mmHg LVOT Vmax:         94.00 cm/s LVOT Vmean:        58.000 cm/s LVOT VTI:          0.177 m LVOT/AV VTI ratio: 0.96  AORTA Ao Root diam: 3.53 cm MITRAL VALVE                TRICUSPID VALVE MV Area (PHT):  3.83 cm     TR Peak grad:   19.5 mmHg MV Decel Time: 198 msec     TR Vmax:        221.00 cm/s MV E velocity: 105.00 cm/s                             SHUNTS                             Systemic VTI:  0.18 m                             Systemic Diam: 2.00 cm                             Pulmonic VTI:  0.209 m Julien Nordmann MD Electronically signed by Julien Nordmann MD Signature Date/Time: 12/16/2021/3:14:47 PM    Final    CT Maxillofacial Wo Contrast  Result Date: 12/15/2021 CLINICAL DATA:  Fall, blunt facial trauma, laceration to forehead, history dementia and recurrent falls, hypertension, stroke EXAM: CT HEAD WITHOUT CONTRAST CT MAXILLOFACIAL WITHOUT CONTRAST CT CERVICAL SPINE WITHOUT CONTRAST TECHNIQUE: Multidetector CT imaging of the head, cervical spine, and maxillofacial structures were performed using the standard protocol without intravenous contrast. Multiplanar CT image reconstructions of the cervical spine and maxillofacial structures were also generated. Right side of face marked with BB. RADIATION DOSE REDUCTION: This exam was performed according to the departmental dose-optimization program which includes automated exposure control, adjustment of the mA and/or kV  according to patient size and/or use of iterative reconstruction technique. COMPARISON:  CT head 04/06/2021 FINDINGS: CT HEAD FINDINGS Brain: Generalized atrophy. Normal ventricular morphology. No midline shift or mass effect. Small vessel chronic ischemic changes of deep cerebral white matter. Small nonspecific parenchymal calcification RIGHT frontal lobe unchanged. Otherwise normal appearance of brain parenchyma. No intracranial hemorrhage, mass lesion, or evidence of acute infarction. No extra-axial fluid collections. Vascular: Mild atherosclerotic calcification of internal carotid arteries at skull base Skull: Intact Other: N/A CT MAXILLOFACIAL FINDINGS Osseous: TMJ alignment normal bilaterally. No facial bone fractures identified.  Orbits: Intraorbital soft tissue planes clear Sinuses: Minimal mucosal thickening in the maxillary sinuses. Remaining paranasal sinuses, visualized mastoid air cells and middle ear cavities clear. Soft tissues: Small frontal scalp contusion. Hematoma LEFT periorbital extending to nasal region. No other facial soft tissue abnormalities. CT CERVICAL SPINE FINDINGS Alignment: Normal Skull base and vertebrae: Osseous mineralization normal. Skull base intact. Vertebral body heights maintained. Multilevel disc space narrowing and endplate spur formation. No fracture, subluxation, or bone destruction. Minor facet degenerative changes. Soft tissues and spinal canal: Prevertebral soft tissues normal thickness. Atherosclerotic calcifications at carotid bifurcations cervical soft tissues otherwise unremarkable. Disc levels:  No specific abnormalities Upper chest: Small BILATERAL pleural effusions at lung apices greater on RIGHT. Other: N/A IMPRESSION: Atrophy with small vessel chronic ischemic changes of deep cerebral white matter. No acute intracranial abnormalities. No acute facial bone abnormalities. 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 No acute cervical spine abnormalities. Small BILATERAL pleural effusions greater on RIGHT. Electronically Signed   By: Ulyses Southward M.D.   On: 12/15/2021 15:59   US Abdomen Limited RUQ (LIVER/GB)  Result Date: 12/16/2021 CLINICAL DATA:  Ascites, possible cirrhosis EXAM: ULTRASOUND ABDOMEN LIMITED RIGHT UPPER QUADRANT COMPARISON:  None Available. FINDINGS: Gallbladder: There is a 1.2 cm gallstone at the neck. No wall thickening visualized. No sonographic Murphy sign noted by sonographer. Common bile duct: Diameter: 6 mm, within normal limits Liver: No focal lesion identified. Increased parenchymal echogenicity. Nodular surface contour. Portal vein is patent on color Doppler imaging with normal direction of blood flow towards the liver. Other:  Ascites and right pleural effusion. IMPRESSION: Nodular liver surface contour suggesting cirrhosis. Increased liver echogenicity may reflect chronic liver disease or steatosis. Ascites is present suggesting portal hypertension. Cholelithiasis without sonographic evidence of acute cholecystitis. Right pleural effusion. Electronically Signed   By: Guadlupe Spanish M.D.   On: 12/16/2021 10:47        Scheduled Meds:  atorvastatin  40 mg Oral Daily   donepezil  5 mg Oral QHS   escitalopram  20 mg Oral Daily   fluticasone  2 spray Each Nare Daily   furosemide  40 mg Oral Daily   metoprolol succinate  25 mg Oral Daily   pantoprazole (PROTONIX) IV  40 mg Intravenous Q12H   potassium chloride  40 mEq Oral Once   Rivaroxaban  15 mg Oral BID WC   Followed by   Melene Muller ON 01/06/2022] rivaroxaban  20 mg Oral Q supper   spironolactone  100 mg Oral Daily   Continuous Infusions:  albumin human       LOS: 2 days      Silvano Bilis, MD [facility]   If 7PM-7AM, please contact night-coverage [email or web address] Password [facility] 12/17/2021, 11:34 AM

## 2021-12-17 NOTE — Procedures (Signed)
PROCEDURE SUMMARY:  Successful image-guided right thoracentesis. Yielded 1.0 liters of clear yellow fluid. Patient tolerated procedure well. EBL < 1 mL No immediate complications.  Specimen was sent for labs. Post procedure CXR shows no pneumothorax.  Please see imaging section of Epic for full dictation.  Villa Herb PA-C 12/17/2021 2:38 PM

## 2021-12-18 ENCOUNTER — Encounter: Payer: Self-pay | Admitting: Family Medicine

## 2021-12-18 ENCOUNTER — Encounter (INDEPENDENT_AMBULATORY_CARE_PROVIDER_SITE_OTHER): Payer: Self-pay | Admitting: Vascular Surgery

## 2021-12-18 DIAGNOSIS — I482 Chronic atrial fibrillation, unspecified: Secondary | ICD-10-CM

## 2021-12-18 DIAGNOSIS — W19XXXA Unspecified fall, initial encounter: Secondary | ICD-10-CM | POA: Diagnosis not present

## 2021-12-18 DIAGNOSIS — I10 Essential (primary) hypertension: Secondary | ICD-10-CM | POA: Diagnosis not present

## 2021-12-18 LAB — COMPREHENSIVE METABOLIC PANEL
ALT: 7 U/L (ref 0–44)
AST: 21 U/L (ref 15–41)
Albumin: 2.8 g/dL — ABNORMAL LOW (ref 3.5–5.0)
Alkaline Phosphatase: 57 U/L (ref 38–126)
Anion gap: 5 (ref 5–15)
BUN: 10 mg/dL (ref 8–23)
CO2: 36 mmol/L — ABNORMAL HIGH (ref 22–32)
Calcium: 8.6 mg/dL — ABNORMAL LOW (ref 8.9–10.3)
Chloride: 99 mmol/L (ref 98–111)
Creatinine, Ser: 0.76 mg/dL (ref 0.61–1.24)
GFR, Estimated: >60 mL/min (ref >60)
Glucose, Bld: 95 mg/dL (ref 70–99)
Potassium: 3.7 mmol/L (ref 3.5–5.1)
Sodium: 140 mmol/L (ref 135–145)
Total Bilirubin: 1.4 mg/dL — ABNORMAL HIGH (ref 0.3–1.2)
Total Protein: 5.5 g/dL — ABNORMAL LOW (ref 6.5–8.1)

## 2021-12-18 LAB — CBC
HCT: 33.1 % — ABNORMAL LOW (ref 39.0–52.0)
Hemoglobin: 11.5 g/dL — ABNORMAL LOW (ref 13.0–17.0)
MCH: 30.7 pg (ref 26.0–34.0)
MCHC: 34.7 g/dL (ref 30.0–36.0)
MCV: 88.5 fL (ref 80.0–100.0)
Platelets: 191 10*3/uL (ref 150–400)
RBC: 3.74 MIL/uL — ABNORMAL LOW (ref 4.22–5.81)
RDW: 20.9 % — ABNORMAL HIGH (ref 11.5–15.5)
WBC: 4.3 10*3/uL (ref 4.0–10.5)
nRBC: 0 % (ref 0.0–0.2)

## 2021-12-18 LAB — TSH: TSH: 1.292 u[IU]/mL (ref 0.350–4.500)

## 2021-12-18 LAB — MAGNESIUM: Magnesium: 1.7 mg/dL (ref 1.7–2.4)

## 2021-12-18 MED ORDER — SPIRONOLACTONE 100 MG PO TABS: 100.0000 mg | ORAL_TABLET | Freq: Every day | ORAL | 1 refills | Status: DC

## 2021-12-18 MED ORDER — FUROSEMIDE 40 MG PO TABS: 40.0000 mg | ORAL_TABLET | Freq: Every day | ORAL | 0 refills | Status: DC | PRN

## 2021-12-18 NOTE — Discharge Summary (Signed)
Craig [name] [id number] DOB: 23-Mar-1941 DOA: 12/15/2021  PCP: Craig Hauser, DO  Admit date: 12/15/2021 Discharge date: 12/18/2021  Time spent: 45 minutes  Recommendations for Outpatient Follow-up:  Pcp f/u 1 week, needs suture removal, check of kidney function and electrolytes, and also f/u tsh result which is pending at time of d/c  Outpatient referral to cardiology and GI    Discharge Diagnoses:  Principal Problem:   Falls Active Problems:   SOB (shortness of breath)   Anasarca   Essential hypertension   Recurrent pulmonary emboli (HCC)   Ascites   Acute respiratory failure with hypoxia (HCC)   Atrial fibrillation, chronic ([place])   Discharge Condition: improved  Diet recommendation: low sodium  Filed Weights   12/15/21 1405  Weight: 114.6 kg    History of present illness:  From admission h and p Craig [name] is a 81 y.o. male with medical history significant of anxiety, hypertension, pulmonary embolism, DVTs, TTP, stroke syndrome presenting today with fall this morning.  Patient reported shortness of breath and swelling of his legs.  Patient does not give any history and is not aware of any history of cirrhosis however imaging does have some ascites which I suspect is from the alcohol.  Patient drinks on a daily basis mixed drink with vodka.  Wife and family at bedside do not report any withdrawals or any concerns.  Patient was taking Xarelto for his pulmonary embolism and there was a period where he was taken off of Xarelto after being on it for quite a while and was restarted.  He has a history of tobacco.  Per wife patient has no history of alcohol withdrawal or seizures or shakes. Patient reports chest pain, shortness of breath, edema affecting his leg thighs and his abdomen.  Patient did not know that he has ascites and has no history of cirrhosis that he knows of.  Per wife patient's never had any withdrawals shakes DTs or seizures in relation to  alcohol.  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 Bnp is elevated and albumin is normal so chf is also in ddx, though TTE is without significant abnormalities. No abd tenderness to suggest sbp and ascites culture is NGTD. Heavy drinking the likely culprit. Platelets normal. Albumin mildly low. Inr elevated though on doac, t bili also mildly elevated. Portal system patent on CT. 2.3 L paracentesis on 5/26, labs consistent w/ portal hypertension. Hep b/c negative. No lesions seen on RUQ u/s. No proteinuria on urinalysis. - continue lasix/spironolactone, will need bmp in 1 week - ambulatory GI referral - alcohol abstinence   # Acute hypoxic respiratory failure # Pleural effusions # Atelectasis Here requiring 5 L initially, O2 low 80s without. S/p 1 L paracentesis on 5/27. Fluid evaluation mostly consistent w/ transudate, likely from ascites. Weaned off o2 - incentive spirometry - diuresis  # A fib New diagnosis. Rate controlled. TTE without sig abnormalities - cont home metop and xarelto - ambulatory referral to cardiology - f/u tsh   # Falls With bruise left eye. No fracture or other acute pathology on ct of face/head/neck. PT consulted, advises outpt PT   # HTN Here bp wnl - cont home metop - hold home losart and amlodipine to allow room for diuresis   # History PE No actute PE or DVT on imaging - cont home xarelto   # Dementia - cont home aricept   # Alcohol use Reports one drink/day  currently, previous heavy drinker - monitor for signs withdrawal but assuming his reported consumption is accurate unlikely to withdraw - abstinence advised   # Lymphedema Followed by vascular, plan for lymphedema pump, though quite possibly won't prove necessary as primary cause of edema likely his cirrhosis, and indeed edema much improved with diuresis    Procedures: Paracentesis, thoracentesis   Consultations: none  Discharge  Exam: Vitals:   12/18/21 0435 12/18/21 0825  BP: 110/81 121/75  Pulse: 60 64  Resp: 20   Temp: 98 F (36.7 C) 98.1 F (36.7 C)  SpO2: 97% 95%    General exam: Appears calm and comfortable  Respiratory system: rales and decreased breaths ounds at bases Cardiovascular system: S1 & S2 heard, RRR. No JVD, murmurs, rubs, gallops or clicks.  Gastrointestinal system: Abdomen is mildly distended, is soft and nontender. No organomegaly or masses felt. Normal bowel sounds heard. Central nervous system: Alert and oriented. No focal neurological deficits. Extremities: Symmetric 5 x 5 power. Skin: bruise left eye. Pitting edema right leg greater than left, much improved Psychiatry: alert, oriented  Discharge Instructions   Discharge Instructions     Ambulatory referral to Cardiology   Complete by: As directed    Ambulatory referral to Gastroenterology   Complete by: As directed    What is the reason for referral?: Other Comment - cirrhosis   Diet - low sodium heart healthy   Complete by: As directed    Increase activity slowly   Complete by: As directed    No wound care   Complete by: As directed       Allergies as of 12/18/2021       Reactions   Trazodone And Nefazodone Itching, Swelling        Medication List     STOP taking these medications    amLODipine 5 MG tablet Commonly known as: NORVASC   losartan 100 MG tablet Commonly known as: COZAAR   Zoster Vaccine Adjuvanted injection Commonly known as: Shingrix       TAKE these medications    atorvastatin 40 MG tablet Commonly known as: LIPITOR TAKE 1 TABLET (40 MG TOTAL) BY MOUTH DAILY.   cholecalciferol 1000 units tablet Commonly known as: VITAMIN D Take 1,000 Units by mouth daily.   donepezil 5 MG tablet Commonly known as: ARICEPT Take 1 tablet (5 mg total) by mouth at bedtime.   escitalopram 20 MG tablet Commonly known as: LEXAPRO TAKE 1 TABLET EVERY DAY   fluticasone 50 MCG/ACT nasal  spray Commonly known as: FLONASE Place 2 sprays into both nostrils daily. Use for 4-6 weeks then stop and use seasonally or as needed.   furosemide 40 MG tablet Commonly known as: LASIX Take 1 tablet (40 mg total) by mouth daily as needed for fluid or edema.   melatonin 5 MG Tabs Take 10 mg by mouth at bedtime. 3 tabs   metoprolol succinate 25 MG 24 hr tablet Commonly known as: TOPROL-XL TAKE 1 TABLET (25 MG TOTAL) BY MOUTH DAILY.   omeprazole 40 MG capsule Commonly known as: PRILOSEC TAKE 1 CAPSULE EVERY DAY   spironolactone 100 MG tablet Commonly known as: ALDACTONE Take 1 tablet (100 mg total) by mouth daily. Start taking on: Dec 19, 2021   vitamin B-12 500 MCG tablet Commonly known as: CYANOCOBALAMIN Take 500 mcg by mouth daily.   vitamin E 1000 UNIT capsule Take 1,000 Units by mouth daily.   Xarelto 20 MG Tabs tablet Generic drug: rivaroxaban TAKE 1 TABLET (  20 MG TOTAL) BY MOUTH DAILY WITH SUPPER.       Allergies  Allergen Reactions   Trazodone And Nefazodone Itching and Swelling    Follow-up Information     Craig Hauser, DO In 5 days.   Specialty: Family Medicine Why: 4 sutures to remove, For suture removal and for hospital follow-up Contact information: [address] [place] [address] [phone]                  The results of significant diagnostics from this hospitalization (including imaging, microbiology, ancillary and laboratory) are listed below for reference.    Significant Diagnostic Studies: DG Chest 2 View  Result Date: 12/15/2021 CLINICAL DATA:  Shortness of breath, fall EXAM: CHEST - 2 VIEW COMPARISON:  Previous studies including the examination of 04/06/2021 FINDINGS: Transverse diameter of heart is increased. There are no signs of alveolar pulmonary edema. Increased density is seen in the medial left lower lung fields. Increased markings are seen in the right lower lung fields. Costophrenic angles are clear. There  is no pneumothorax. IMPRESSION: Increased density in the medial left lower lung fields may suggest atelectasis/pneumonia. Increased interstitial markings in the right lower lung fields may suggest scarring and/or atelectasis/pneumonia. There are no signs of alveolar pulmonary edema. Electronically Signed   By: Elmer Picker M.D.   On: 12/15/2021 16:01   CT Head Wo Contrast  Result Date: 12/15/2021 CLINICAL DATA:  Fall, blunt facial trauma, laceration to forehead, history dementia and recurrent falls, hypertension, stroke EXAM: CT HEAD WITHOUT CONTRAST CT MAXILLOFACIAL WITHOUT CONTRAST CT CERVICAL SPINE WITHOUT CONTRAST TECHNIQUE: Multidetector CT imaging of the head, cervical spine, and maxillofacial structures were performed using the standard protocol without intravenous contrast. Multiplanar CT image reconstructions of the cervical spine and maxillofacial structures were also generated. Right side of face marked with BB. RADIATION DOSE REDUCTION: This exam was performed according to the departmental dose-optimization program which includes automated exposure control, adjustment of the mA and/or kV according to patient size and/or use of iterative reconstruction technique. COMPARISON:  CT head 04/06/2021 FINDINGS: CT HEAD FINDINGS Brain: Generalized atrophy. Normal ventricular morphology. No midline shift or mass effect. Small vessel chronic ischemic changes of deep cerebral white matter. Small nonspecific parenchymal calcification RIGHT frontal lobe unchanged. Otherwise normal appearance of brain parenchyma. No intracranial hemorrhage, mass lesion, or evidence of acute infarction. No extra-axial fluid collections. Vascular: Mild atherosclerotic calcification of internal carotid arteries at skull base Skull: Intact Other: N/A CT MAXILLOFACIAL FINDINGS Osseous: TMJ alignment normal bilaterally. No facial bone fractures identified. Orbits: Intraorbital soft tissue planes clear Sinuses: Minimal mucosal  thickening in the maxillary sinuses. Remaining paranasal sinuses, visualized mastoid air cells and middle ear cavities clear. Soft tissues: Small frontal scalp contusion. Hematoma LEFT periorbital extending to nasal region. No other facial soft tissue abnormalities. CT CERVICAL SPINE FINDINGS Alignment: Normal Skull base and vertebrae: Osseous mineralization normal. Skull base intact. Vertebral body heights maintained. Multilevel disc space narrowing and endplate spur formation. No fracture, subluxation, or bone destruction. Minor facet degenerative changes. Soft tissues and spinal canal: Prevertebral soft tissues normal thickness. Atherosclerotic calcifications at carotid bifurcations cervical soft tissues otherwise unremarkable. Disc levels:  No specific abnormalities Upper chest: Small BILATERAL pleural effusions at lung apices greater on RIGHT. Other: N/A IMPRESSION: Atrophy with small vessel chronic ischemic changes of deep cerebral white matter. No acute intracranial abnormalities. No acute facial bone abnormalities. 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 No acute cervical spine abnormalities.  Small BILATERAL pleural effusions greater on RIGHT. Electronically Signed   By: Lavonia Dana M.D.   On: 12/15/2021 15:59   CT Angio Chest PE W and/or Wo Contrast  Result Date: 12/15/2021 CLINICAL DATA:  Concern for pulmonary embolism. Several falls recently. EXAM: CT ANGIOGRAPHY CHEST CT ABDOMEN AND PELVIS WITH CONTRAST TECHNIQUE: Multidetector CT imaging of the chest was performed using the standard protocol during bolus administration of intravenous contrast. Multiplanar CT image reconstructions and MIPs were obtained to evaluate the vascular anatomy. Multidetector CT imaging of the abdomen and pelvis was performed using the standard protocol during bolus administration of intravenous contrast. RADIATION DOSE REDUCTION: This exam was performed according to the departmental  dose-optimization program which includes automated exposure control, adjustment of the mA and/or kV according to patient size and/or use of iterative reconstruction technique. CONTRAST:  129mL OMNIPAQUE IOHEXOL 350 MG/ML SOLN COMPARISON:  None Available. FINDINGS: CTA CHEST FINDINGS Cardiovascular: There is a thin linear filling defect within the RIGHT lower lobe pulmonary artery. This very thin curvilinear defect is favored residual synechia from remodeled remote thromboembolus. No occlusion. No filling defects within the pulmonary arteries to suggest acute pulmonary embolism. No pericardial fluid. Calcifications of the aortic arch coronary arteries. Mediastinum/Nodes: No axillary or supraclavicular adenopathy. No mediastinal or hilar adenopathy. No pericardial fluid. Esophagus normal. Lungs/Pleura: Moderate bilateral pleural effusions. No pulmonary infarction. Mild bibasilar passive atelectasis. Musculoskeletal: No aggressive osseous lesion Review of the MIP images confirms the above findings. CT ABDOMEN and PELVIS FINDINGS Hepatobiliary: No focal hepatic lesion. Postcholecystectomy. No biliary dilatation. Liver has a lobular contour. Portal veins are patent. Large volume intraperitoneal free fluid suggest ascites. Pancreas: Pancreas is normal. No ductal dilatation. No pancreatic inflammation. Spleen: Post splenectomy Adrenals/urinary tract: Kidneys enhance symmetrically. Normal adrenal glands. Nonenhancing cysts of the LEFT kidney. Nonobstructing calculus LEFT kidney. Ureters bladder normal. Stomach/Bowel: Stomach, small-bowel and cecum are normal. The appendix is not identified but there is no pericecal inflammation to suggest appendicitis. The colon and rectosigmoid colon are normal. Vascular/Lymphatic: Abdominal aorta is normal caliber with atherosclerotic calcification. There is no retroperitoneal or periportal lymphadenopathy. No pelvic lymphadenopathy. Reproductive: Prostate normal Other: Intraperitoneal  free air. Large volume ascites. Anasarca the soft tissues. Musculoskeletal: No aggressive osseous lesion. Review of the MIP images confirms the above findings. IMPRESSION: Chest Impression: 1. No evidence acute pulmonary embolism. 2. 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 Nonocclusive. 3. Moderate bilateral pleural effusions with passive atelectasis of the lower lobes. Abdomen / Pelvis Impression: 1. Large volume of intraperitoneal free fluid in the abdomen pelvis suggest ascites. 2. Portal veins are patent.  Lobular margin of the liver. 3. Post splenectomy. 4. Anasarca the subcutaneous tissues in the abdomen pelvis. Electronically Signed   By: Suzy Bouchard M.D.   On: 12/15/2021 16:59   CT Cervical Spine Wo Contrast  Result Date: 12/15/2021 CLINICAL DATA:  Fall, blunt facial trauma, laceration to forehead, history dementia and recurrent falls, hypertension, stroke EXAM: CT HEAD WITHOUT CONTRAST CT MAXILLOFACIAL WITHOUT CONTRAST CT CERVICAL SPINE WITHOUT CONTRAST TECHNIQUE: Multidetector CT imaging of the head, cervical spine, and maxillofacial structures were performed using the standard protocol without intravenous contrast. Multiplanar CT image reconstructions of the cervical spine and maxillofacial structures were also generated. Right side of face marked with BB. RADIATION DOSE REDUCTION: This exam was performed according to the departmental dose-optimization program which includes automated exposure control, adjustment of the mA and/or kV according to patient size and/or use of iterative reconstruction technique. COMPARISON:  CT head 04/06/2021 FINDINGS: CT HEAD FINDINGS Brain: Generalized atrophy. Normal ventricular morphology. No midline shift or mass effect. Small vessel chronic ischemic changes of deep cerebral white matter. Small nonspecific parenchymal calcification RIGHT frontal lobe unchanged. Otherwise normal appearance of brain parenchyma. No  intracranial hemorrhage, mass lesion, or evidence of acute infarction. No extra-axial fluid collections. Vascular: Mild atherosclerotic calcification of internal carotid arteries at skull base Skull: Intact Other: N/A CT MAXILLOFACIAL FINDINGS Osseous: TMJ alignment normal bilaterally. No facial bone fractures identified. Orbits: Intraorbital soft tissue planes clear Sinuses: Minimal mucosal thickening in the maxillary sinuses. Remaining paranasal sinuses, visualized mastoid air cells and middle ear cavities clear. Soft tissues: Small frontal scalp contusion. Hematoma LEFT periorbital extending to nasal region. No other facial soft tissue abnormalities. CT CERVICAL SPINE FINDINGS Alignment: Normal Skull base and vertebrae: Osseous mineralization normal. Skull base intact. Vertebral body heights maintained. Multilevel disc space narrowing and endplate spur formation. No fracture, subluxation, or bone destruction. Minor facet degenerative changes. Soft tissues and spinal canal: Prevertebral soft tissues normal thickness. Atherosclerotic calcifications at carotid bifurcations cervical soft tissues otherwise unremarkable. Disc levels:  No specific abnormalities Upper chest: Small BILATERAL pleural effusions at lung apices greater on RIGHT. Other: N/A IMPRESSION: Atrophy with small vessel chronic ischemic changes of deep cerebral white matter. No acute intracranial abnormalities. No acute facial bone abnormalities. 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 No acute cervical spine abnormalities. Small BILATERAL pleural effusions greater on RIGHT. Electronically Signed   By: Lavonia Dana M.D.   On: 12/15/2021 15:59   CT ABDOMEN PELVIS W CONTRAST  Result Date: 12/15/2021 CLINICAL DATA:  Concern for pulmonary embolism. Several falls recently. EXAM: CT ANGIOGRAPHY CHEST CT ABDOMEN AND PELVIS WITH CONTRAST TECHNIQUE: Multidetector CT imaging of the chest was performed using the standard  protocol during bolus administration of intravenous contrast. Multiplanar CT image reconstructions and MIPs were obtained to evaluate the vascular anatomy. Multidetector CT imaging of the abdomen and pelvis was performed using the standard protocol during bolus administration of intravenous contrast. RADIATION DOSE REDUCTION: This exam was performed according to the departmental dose-optimization program which includes automated exposure control, adjustment of the mA and/or kV according to patient size and/or use of iterative reconstruction technique. CONTRAST:  183mL OMNIPAQUE IOHEXOL 350 MG/ML SOLN COMPARISON:  None Available. FINDINGS: CTA CHEST FINDINGS Cardiovascular: There is a thin linear filling defect within the RIGHT lower lobe pulmonary artery. This very thin curvilinear defect is favored residual synechia from remodeled remote thromboembolus. No occlusion. No filling defects within the pulmonary arteries to suggest acute pulmonary embolism. No pericardial fluid. Calcifications of the aortic arch coronary arteries. Mediastinum/Nodes: No axillary or supraclavicular adenopathy. No mediastinal or hilar adenopathy. No pericardial fluid. Esophagus normal. Lungs/Pleura: Moderate bilateral pleural effusions. No pulmonary infarction. Mild bibasilar passive atelectasis. Musculoskeletal: No aggressive osseous lesion Review of the MIP images confirms the above findings. CT ABDOMEN and PELVIS FINDINGS Hepatobiliary: No focal hepatic lesion. Postcholecystectomy. No biliary dilatation. Liver has a lobular contour. Portal veins are patent. Large volume intraperitoneal free fluid suggest ascites. Pancreas: Pancreas is normal. No ductal dilatation. No pancreatic inflammation. Spleen: Post splenectomy Adrenals/urinary tract: Kidneys enhance symmetrically. Normal adrenal glands. Nonenhancing cysts of the LEFT kidney. Nonobstructing calculus LEFT kidney. Ureters bladder normal. Stomach/Bowel: Stomach, small-bowel and cecum  are normal. The appendix is not identified but there is no pericecal inflammation to suggest appendicitis. The colon and rectosigmoid colon are normal. Vascular/Lymphatic: Abdominal aorta is normal caliber with atherosclerotic calcification. There is  no retroperitoneal or periportal lymphadenopathy. No pelvic lymphadenopathy. Reproductive: Prostate normal Other: Intraperitoneal free air. Large volume ascites. Anasarca the soft tissues. Musculoskeletal: No aggressive osseous lesion. Review of the MIP images confirms the above findings. IMPRESSION: Chest Impression: 1. No evidence acute pulmonary embolism. 2. 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 Nonocclusive. 3. Moderate bilateral pleural effusions with passive atelectasis of the lower lobes. Abdomen / Pelvis Impression: 1. Large volume of intraperitoneal free fluid in the abdomen pelvis suggest ascites. 2. Portal veins are patent.  Lobular margin of the liver. 3. Post splenectomy. 4. Anasarca the subcutaneous tissues in the abdomen pelvis. Electronically Signed   By: Genevive Bi M.D.   On: 12/15/2021 16:59   US Venous Img Lower Bilateral  Result Date: 12/15/2021 CLINICAL DATA:  Swelling on the RIGHT greater than LEFT of the lower extremities. EXAM: Bilateral LOWER EXTREMITY VENOUS DOPPLER ULTRASOUND TECHNIQUE: Gray-scale sonography with compression, as well as color and duplex ultrasound, were performed to evaluate the deep venous system(s) from the level of the common femoral vein through the popliteal and proximal calf veins. COMPARISON:  None Available. FINDINGS: VENOUS Normal compressibility of the common femoral, superficial femoral, and popliteal veins, as well as the visualized calf veins. Visualized portions of profunda femoral vein and great saphenous vein unremarkable. No filling defects to suggest DVT on grayscale or color Doppler imaging. Doppler waveforms show normal direction of venous flow, normal  respiratory plasticity and response to augmentation. OTHER Signs of edema in the subcutaneous fat of the bilateral lower extremities. Limitations: none IMPRESSION: No sonographic evidence of DVT in the RIGHT or LEFT lower extremity with evidence of lower extremity edema in the subcutaneous fat. Electronically Signed   By: Donzetta Kohut M.D.   On: 12/15/2021 17:09   US Paracentesis  Result Date: 12/16/2021 INDICATION: Patient with history of daily ETOH use who fell at home today, CT shows new onset ascites. Request for diagnostic and therapeutic paracentesis. EXAM: ULTRASOUND GUIDED DIAGNOSTIC AND THERAPEUTIC PARACENTESIS MEDICATIONS: 8 mL 1% lidocaine COMPLICATIONS: None immediate. PROCEDURE: Informed written consent was obtained from the patient after a discussion of the risks, benefits and alternatives to treatment. A timeout was performed prior to the initiation of the procedure. Initial ultrasound scanning demonstrates a large amount of ascites within the right upper abdominal quadrant. The right upper abdomen was prepped and draped in the usual sterile fashion. 1% lidocaine was used for local anesthesia. Following this, a 6 Fr Safe-T-Centesis catheter was introduced. An ultrasound image was saved for documentation purposes. The paracentesis was performed. The catheter was removed and a dressing was applied. The patient tolerated the procedure well without immediate post procedural complication. FINDINGS: A total of approximately 2.8 L of clear yellow fluid was removed. Samples were sent to the laboratory as requested by the clinical team. IMPRESSION: Successful ultrasound-guided paracentesis yielding 2.8 liters of peritoneal fluid. Read by Lynnette Caffey, PA-C Electronically Signed   By: Olive Bass M.D.   On: 12/16/2021 11:57   DG Chest Port 1 View  Result Date: 12/17/2021 CLINICAL DATA:  Status post right thoracentesis. EXAM: PORTABLE CHEST 1 VIEW COMPARISON:  12/15/2021 and older exams.  FINDINGS: Decreased right lung base opacity consistent with the interval thoracentesis. No visible residual fluid. Opacity at the medial left lung base is consistent with a combination of a residual small effusion and atelectasis. Lungs demonstrate chronic interstitial thickening and mild apical scarring, but are otherwise clear. No pneumothorax. IMPRESSION: 1. Significant decrease in right pleural  effusion following thoracentesis. No pneumothorax or other evidence of a procedure complication. Electronically Signed   By: Lajean Manes M.D.   On: 12/17/2021 14:58   ECHOCARDIOGRAM COMPLETE  Result Date: 12/16/2021    ECHOCARDIOGRAM REPORT   Patient Name:   METRO PICKETT Date of Exam: 12/16/2021 Medical Rec #:  [id number]     Height:       75.0 in Accession #:    [id number]    Weight:       252.6 lb Date of Birth:  [birth year]      BSA:          2.423 m Patient Age:    81 years      BP:           117/69 mmHg Patient Gender: M             HR:           60 bpm. Exam Location:  [facility] Procedure: 2D Echo, Cardiac Doppler and Color Doppler Indications:     CHF-acute systolic [id number]  History:         Patient has prior history of Echocardiogram examinations, most                  recent 05/25/2017. Stroke; Risk Factors:Hypertension. Pulmonary                  emboli.  Sonographer:     Sherrie Sport Referring Phys:  [place] Diagnosing Phys: Ida Rogue MD  Sonographer Comments: Suboptimal apical window. IMPRESSIONS  1. Left ventricular ejection fraction, by estimation, is 60 to 65%. The left ventricle has normal function. The left ventricle has no regional wall motion abnormalities. Left ventricular diastolic parameters are indeterminate.  2. Right ventricular systolic function is normal. The right ventricular size is moderately enlarged. There is normal pulmonary artery systolic pressure. The estimated right ventricular systolic pressure is 123456 mmHg.  3. Left atrial size was severely dilated.  4. Right atrial size  was severely dilated.  5. Large pleural effusion in the left lateral region.  6. The mitral valve is normal in structure. Mild mitral valve regurgitation. No evidence of mitral stenosis.  7. The aortic valve is normal in structure. There is moderate calcification of the aortic valve. Aortic valve regurgitation is not visualized. No aortic stenosis is present.  8. The inferior vena cava is normal in size with <50% respiratory variability, suggesting right atrial pressure of 8 mmHg. FINDINGS  Left Ventricle: Left ventricular ejection fraction, by estimation, is 60 to 65%. The left ventricle has normal function. The left ventricle has no regional wall motion abnormalities. The left ventricular internal cavity size was normal in size. There is  no left ventricular hypertrophy. Left ventricular diastolic parameters are indeterminate. Right Ventricle: The right ventricular size is moderately enlarged. No increase in right ventricular wall thickness. Right ventricular systolic function is normal. There is normal pulmonary artery systolic pressure. The tricuspid regurgitant velocity is 2.21 m/s, and with an assumed right atrial pressure of 5 mmHg, the estimated right ventricular systolic pressure is 123456 mmHg. Left Atrium: Left atrial size was severely dilated. Right Atrium: Right atrial size was severely dilated. Pericardium: There is no evidence of pericardial effusion. Mitral Valve: The mitral valve is normal in structure. Mild mitral valve regurgitation. No evidence of mitral valve stenosis. Tricuspid Valve: The tricuspid valve is normal in structure. Tricuspid valve regurgitation is mild . No evidence of tricuspid stenosis. Aortic Valve: The  aortic valve is normal in structure. There is moderate calcification of the aortic valve. Aortic valve regurgitation is not visualized. No aortic stenosis is present. Aortic valve mean gradient measures 2.0 mmHg. Aortic valve peak gradient measures 4.0 mmHg. Aortic valve area, by  VTI measures 3.01 cm. Pulmonic Valve: The pulmonic valve was normal in structure. Pulmonic valve regurgitation is not visualized. No evidence of pulmonic stenosis. Aorta: The aortic root is normal in size and structure. Venous: The inferior vena cava is normal in size with less than 50% respiratory variability, suggesting right atrial pressure of 8 mmHg. IAS/Shunts: No atrial level shunt detected by color flow Doppler. Additional Comments: There is a large pleural effusion in the left lateral region.  LEFT VENTRICLE PLAX 2D LVIDd:         5.60 cm LVIDs:         3.80 cm LV PW:         1.30 cm LV IVS:        0.80 cm LVOT diam:     2.00 cm LV SV:         56 LV SV Index:   23 LVOT Area:     3.14 cm  LEFT ATRIUM              Index        RIGHT ATRIUM           Index LA diam:        5.60 cm  2.31 cm/m   RA Area:     43.30 cm LA Vol (A2C):   203.0 ml 83.77 ml/m  RA Volume:   184.00 ml 75.93 ml/m LA Vol (A4C):   141.0 ml 58.19 ml/m LA Biplane Vol: 170.0 ml 70.15 ml/m  AORTIC VALVE                    PULMONIC VALVE AV Area (Vmax):    2.95 cm     PV Vmax:        0.87 m/s AV Area (Vmean):   2.91 cm     PV Vmean:       59.300 cm/s AV Area (VTI):     3.01 cm     PV VTI:         0.155 m AV Vmax:           100.00 cm/s  PV Peak grad:   3.0 mmHg AV Vmean:          62.700 cm/s  PV Mean grad:   2.0 mmHg AV VTI:            0.185 m      RVOT Peak grad: 4 mmHg AV Peak Grad:      4.0 mmHg AV Mean Grad:      2.0 mmHg LVOT Vmax:         94.00 cm/s LVOT Vmean:        58.000 cm/s LVOT VTI:          0.177 m LVOT/AV VTI ratio: 0.96  AORTA Ao Root diam: 3.53 cm MITRAL VALVE                TRICUSPID VALVE MV Area (PHT): 3.83 cm     TR Peak grad:   19.5 mmHg MV Decel Time: 198 msec     TR Vmax:        221.00 cm/s MV E velocity: 105.00 cm/s  SHUNTS                             Systemic VTI:  0.18 m                             Systemic Diam: 2.00 cm                             Pulmonic VTI:  0.209 m Ida Rogue MD Electronically signed by Ida Rogue MD Signature Date/Time: 12/16/2021/3:14:47 PM    Final    VAS [place] LOWER EXTREMITY VENOUS REFLUX  Result Date: 11/24/2021  Lower Venous Reflux Study Patient Name:  IKENNA SAMRA  Date of Exam:   11/22/2021 Medical Rec #: [id number]      Accession #:    [id number] Date of Birth: 05-Dec-1940       Patient Gender: M Patient Age:   15 years Exam Location:  [facility] Procedure:      VAS [place] LOWER EXTREMITY VENOUS REFLUX Referring Phys: Leotis Pain --------------------------------------------------------------------------------  Indications: Swelling, and Edema.  Performing Technologist: Concha Norway RVT  Examination Guidelines: A complete evaluation includes B-mode imaging, spectral Doppler, color Doppler, and power Doppler as needed of all accessible portions of each vessel. Bilateral testing is considered an integral part of a complete examination. Limited examinations for reoccurring indications may be performed as noted. The reflux portion of the exam is performed with the patient in reverse Trendelenburg. Significant venous reflux is defined as >500 ms in the superficial venous system, and >1 second in the deep venous system.   Summary: Bilateral: - No evidence of deep vein thrombosis seen in the lower extremities, bilaterally, from the common femoral through the popliteal veins. - No evidence of superficial venous thrombosis in the lower extremities, bilaterally. - No evidence of deep venous insufficiency seen bilaterally in the lower extremity. - No evidence of superficial venous reflux seen in the greater saphenous veins bilaterally. - No evidence of superficial venous reflux seen in the short saphenous veins bilaterally.  *See table(s) above for measurements and observations. Electronically signed by Leotis Pain MD on 11/24/2021 at 3:10:40 PM.    Final    CT Maxillofacial Wo Contrast  Result Date: 12/15/2021 CLINICAL DATA:  Fall, blunt facial trauma,  laceration to forehead, history dementia and recurrent falls, hypertension, stroke EXAM: CT HEAD WITHOUT CONTRAST CT MAXILLOFACIAL WITHOUT CONTRAST CT CERVICAL SPINE WITHOUT CONTRAST TECHNIQUE: Multidetector CT imaging of the head, cervical spine, and maxillofacial structures were performed using the standard protocol without intravenous contrast. Multiplanar CT image reconstructions of the cervical spine and maxillofacial structures were also generated. Right side of face marked with BB. RADIATION DOSE REDUCTION: This exam was performed according to the departmental dose-optimization program which includes automated exposure control, adjustment of the mA and/or kV according to patient size and/or use of iterative reconstruction technique. COMPARISON:  CT head 04/06/2021 FINDINGS: CT HEAD FINDINGS Brain: Generalized atrophy. Normal ventricular morphology. No midline shift or mass effect. Small vessel chronic ischemic changes of deep cerebral white matter. Small nonspecific parenchymal calcification RIGHT frontal lobe unchanged. Otherwise normal appearance of brain parenchyma. No intracranial hemorrhage, mass lesion, or evidence of acute infarction. No extra-axial fluid collections. Vascular: Mild atherosclerotic calcification of internal carotid arteries at skull base Skull: Intact Other: N/A CT MAXILLOFACIAL FINDINGS Osseous: TMJ alignment normal bilaterally.  No facial bone fractures identified. Orbits: Intraorbital soft tissue planes clear Sinuses: Minimal mucosal thickening in the maxillary sinuses. Remaining paranasal sinuses, visualized mastoid air cells and middle ear cavities clear. Soft tissues: Small frontal scalp contusion. Hematoma LEFT periorbital extending to nasal region. No other facial soft tissue abnormalities. CT CERVICAL SPINE FINDINGS Alignment: Normal Skull base and vertebrae: Osseous mineralization normal. Skull base intact. Vertebral body heights maintained. Multilevel disc space narrowing and  endplate spur formation. No fracture, subluxation, or bone destruction. Minor facet degenerative changes. Soft tissues and spinal canal: Prevertebral soft tissues normal thickness. Atherosclerotic calcifications at carotid bifurcations cervical soft tissues otherwise unremarkable. Disc levels:  No specific abnormalities Upper chest: Small BILATERAL pleural effusions at lung apices greater on RIGHT. Other: N/A IMPRESSION: Atrophy with small vessel chronic ischemic changes of deep cerebral white matter. No acute intracranial abnormalities. No acute facial bone abnormalities. 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 No acute cervical spine abnormalities. Small BILATERAL pleural effusions greater on RIGHT. Electronically Signed   By: Lavonia Dana M.D.   On: 12/15/2021 15:59   US Abdomen Limited RUQ (LIVER/GB)  Result Date: 12/16/2021 CLINICAL DATA:  Ascites, possible cirrhosis EXAM: ULTRASOUND ABDOMEN LIMITED RIGHT UPPER QUADRANT COMPARISON:  None Available. FINDINGS: Gallbladder: There is a 1.2 cm gallstone at the neck. No wall thickening visualized. No sonographic Murphy sign noted by sonographer. Common bile duct: Diameter: 6 mm, within normal limits Liver: No focal lesion identified. Increased parenchymal echogenicity. Nodular surface contour. Portal vein is patent on color Doppler imaging with normal direction of blood flow towards the liver. Other: Ascites and right pleural effusion. IMPRESSION: Nodular liver surface contour suggesting cirrhosis. Increased liver echogenicity may reflect chronic liver disease or steatosis. Ascites is present suggesting portal hypertension. Cholelithiasis without sonographic evidence of acute cholecystitis. Right pleural effusion. Electronically Signed   By: Macy Mis M.D.   On: 12/16/2021 10:47   US THORACENTESIS ASP PLEURAL SPACE W/IMG GUIDE  Result Date: 12/18/2021 INDICATION: Patient history of daily ETOH use, recent fall. CTA  chest shows new right pleural effusion. Request IR for diagnostic and therapeutic right thoracentesis. EXAM: ULTRASOUND GUIDED RIGHT THORACENTESIS MEDICATIONS: 8 mL 1% lidocaine COMPLICATIONS: None immediate. PROCEDURE: An ultrasound guided thoracentesis was thoroughly discussed with the patient and questions answered. The benefits, risks, alternatives and complications were also discussed. The patient understands and wishes to proceed with the procedure. Written consent was obtained. Ultrasound was performed to localize and mark an adequate pocket of fluid in the right chest. The area was then prepped and draped in the normal sterile fashion. 1% Lidocaine was used for local anesthesia. Under ultrasound guidance a 6 Fr Safe-T-Centesis catheter was introduced. Thoracentesis was performed. The catheter was removed and a dressing applied. FINDINGS: A total of approximately 1.0 L of clear yellow fluid was removed. Samples were sent to the laboratory as requested by the clinical team. IMPRESSION: Successful ultrasound guided right thoracentesis yielding 1.0 L of pleural fluid. Read by Candiss Norse, PA-C Electronically Signed   By: Miachel Roux M.D.   On: 12/18/2021 06:06    Microbiology: Recent Results (from the past 240 hour(s))  Resp Panel by RT-PCR (Flu A&B, Covid) Anterior Nasal Swab     Status: None   Collection Time: 12/15/21  2:46 PM   Specimen: Anterior Nasal Swab  Result Value Ref Range Status   SARS Coronavirus 2 by RT PCR NEGATIVE NEGATIVE Final    Comment: (NOTE) SARS-CoV-2 target nucleic acids are NOT DETECTED.  The SARS-CoV-2 RNA is generally detectable in upper respiratory specimens during the acute phase of infection. The lowest concentration of SARS-CoV-2 viral copies this assay can detect is 138 copies/mL. A negative result does not preclude SARS-Cov-2 infection and should not be used as the sole basis for treatment or other patient management decisions. A negative result may occur  with  improper specimen collection/handling, submission of specimen other than nasopharyngeal swab, presence of viral mutation(s) within the areas targeted by this assay, and inadequate number of viral copies(<138 copies/mL). A negative result must be combined with clinical observations, patient history, and epidemiological information. The expected result is Negative.  Fact Sheet for Patients:  https://www.fda.gov/media/152166/download  Fact Sheet for Healthcare Providers:  SeriousBroker.ithttps://www.fda.gov/media/152162/download  This test is no t yet approved or cleared by the [place] FDA and  has been authorized for detection and/or diagnosis of SARS-CoV-2 by FDA under an Emergency Use Authorization (EUA). This EUA will remain  in effect (meaning this test can be used) for the duration of the COVID-19 declaration under Section 564(b)(1) of the Act, 21 U.S.C.sectioBloggerCourse.comn 360bbb-3(b)(1), unless the authorization is terminated  or revoked sooner.       Influenza A by PCR NEGATIVE NEGATIVE Final   Influenza B by PCR NEGATIVE NEGATIVE Final    Comment: (NOTE) The Xpert Xpress SARS-CoV-2/FLU/RSV plus assay is intended as an aid in the diagnosis of influenza from Nasopharyngeal swab specimens and should not be used as a sole basis for treatment. Nasal washings and aspirates are unacceptable for Xpert Xpress SARS-CoV-2/FLU/RSV testing.  Fact Sheet for Patients: BloggerCourse.comhttps://www.fda.gov/media/152166/download  Fact Sheet for Healthcare Providers: SeriousBroker.ithttps://www.fda.gov/media/152162/download  This test is not yet approved or cleared by the [place] FDA and has been authorized for detection and/or diagnosis of SARS-CoV-2 by FDA under an Emergency Use Authorization (EUA). This EUA will remain in effect (meaning this test can be used) for the duration of the COVID-19 declaration under Section 564(b)(1) of the Act, 21 U.S.C. section 360bbb-3(b)(1), unless the authorization is terminated  or revoked.  Performed at [facility], [address] [place], [place] [address]   Body fluid culture w Gram Stain     Status: None (Preliminary result)   Collection Time: 12/16/21 10:24 AM   Specimen: PATH Cytology Peritoneal fluid  Result Value Ref Range Status   Specimen Description   Final    PERITONEAL Performed at [facility], [address] [place], [place] [address]    Special Requests   Final    PERITONEAL Performed at [facility], [address] [place], [place] [address]    Gram Stain   Final    RARE WBC PRESENT, PREDOMINANTLY PMN NO ORGANISMS SEEN    Culture   Final    NO GROWTH 2 DAYS Performed at [facility], [address] [place], [place] [address]    Report Status PENDING  Incomplete  Body fluid culture w Gram Stain     Status: None (Preliminary result)   Collection Time: 12/17/21  2:30 PM   Specimen: PATH Cytology Pleural fluid  Result Value Ref Range Status   Specimen Description   Final    PLEURAL FLUID Performed at [facility], [address] [place], [place] [address]    Special Requests   Final    NONE Performed at [facility], [address] [place], [place] [address]    Gram Stain   Final    RARE WBC PRESENT, PREDOMINANTLY MONONUCLEAR NO ORGANISMS  SEEN    Culture   Final    NO GROWTH < 24 HOURS Performed at [facility], [place] [address], [place], [place] [address]    Report Status PENDING  Incomplete     Labs: Basic Metabolic Panel: Recent Labs  Lab 12/15/21 1416 12/16/21 0506 12/17/21 0503 12/18/21 0307  NA 139 140 139 140  K 4.3 3.5 3.3* 3.7  CL 102 99 97* 99  CO2 32 34* 36* 36*  GLUCOSE 99 83 93 95  BUN 15 14 11 10   CREATININE 0.93 0.81 0.70 0.76  CALCIUM 9.2 9.1 8.8* 8.6*  MG  --   --  1.7 1.7   Liver Function Tests: Recent Labs  Lab 12/16/21 0506 12/17/21 0503 12/18/21 0307  AST 18 18 21   ALT 8 7 7   ALKPHOS 59 54 57  BILITOT 1.4* 1.4* 1.4*   PROT 6.5 5.6* 5.5*  ALBUMIN 3.3* 2.9* 2.8*   No results for input(s): LIPASE, AMYLASE in the last 168 hours. No results for input(s): AMMONIA in the last 168 hours. CBC: Recent Labs  Lab 12/15/21 1416 12/16/21 0506 12/17/21 0503 12/18/21 0307  WBC 5.2 5.1 3.6* 4.3  HGB 13.6 12.6* 12.0* 11.5*  HCT 40.9 37.2* 35.3* 33.1*  MCV 93.0 91.4 89.8 88.5  PLT 226 218 201 191   Cardiac Enzymes: No results for input(s): CKTOTAL, CKMB, CKMBINDEX, TROPONINI in the last 168 hours. BNP: BNP (last 3 results) Recent Labs    12/15/21 1418  BNP 363.3*    ProBNP (last 3 results) No results for input(s): PROBNP in the last 8760 hours.  CBG: Recent Labs  Lab 12/16/21 2105  GLUCAP 175*       Signed:  Desma Maxim MD.  [facility] 12/18/2021, 11:17 AM

## 2021-12-18 NOTE — Progress Notes (Signed)
Occupational Therapy * Physical Therapy * Speech Therapy          DATE ___________________ PATIENT NAME_____________________ PATIENT MRN____________________  DIAGNOSIS/DIAGNOSIS CODE ______________________ DATE OF DISCHARGE: ______________  PRIMARY CARE PHYSICIAN __________________________ PCP PHONE/FAX___________________________     Dear Provider (Name: __________________   Fax: ___________________________):   I certify that I have examined this patient and that occupational/physical/speech therapy is necessary on an outpatient basis.    The patient has expressed interest in completing their recommended course of therapy at your location.  Once a formal order from the patient's primary care physician has been obtained, please contact him/her to schedule an appointment for evaluation at your earliest convenience.   [  ]  Physical Therapy Evaluate and Treat          [  ]  Occupational Therapy Evaluate and Treat                                    [  ]  Speech Therapy Evaluate and Treat       The patient's primary care physician (listed above) must furnish and be responsible for a formal order such that the recommended services may be furnished while under the primary physician's care, and that the plan of care will be established and reviewed every 30 days (or more often if condition necessitates).  

## 2021-12-18 NOTE — TOC Transition Note (Addendum)
Transition of Care [facility]) - CM/SW Discharge Note   Patient Details  Name: RAPHEAL KACZYNSKI MRN: [id number] Date of Birth: 1941/03/09  [facility] CM[facility]SW Contact:  Izola Price, RN Phone Number: 12/18/2021, 1:03 PM   Clinical Narrative:  5/28: Patient discharge to home/self care with outpatient therapy recommended. No DME needs noted. Sent progress note for outpatient therapy orders and messaged provider for co-sign. Simmie Davies RN CM      Final next level of care: Home/Self Care Barriers to Discharge: Barriers Resolved   Patient Goals and CMS Choice     Choice offered to / list presented to : NA  Discharge Placement                       Discharge Plan and Services                DME Arranged: N/A DME Agency: NA       HH Arranged:  (Outpatient therapy with [place]) [place] Agency: NA        Social Determinants of Health (SDOH) Interventions     Readmission Risk Interventions     View : No data to display.

## 2021-12-18 NOTE — Evaluation (Addendum)
Physical Therapy Evaluation Patient Details Name: Craig [name] MRN: [id number] DOB: [birth year] Today's Date: 12/18/2021  History of Present Illness  Pt is a 81 y/o M admitted on 12/15/21 after presenting with c/o fall. Pt also c/o SOB & BLE edema. Pt is being treated for ascites & pleural effusions, ARF. PMH: anxiety, HTN, PE, DVTs, TTP, stroke syndrome  Clinical Impression  Pt seen for PT evaluation with pt's wife in room. Prior to admission pt was living with wife, using RW PRN, but has hx of multiple falls but pt stating "I get around okay". Pt requires min assist to ambulate without AD & supervision to ambulate increased distances with RW. Pt negotiates 6 steps with 1 rail to simulate home environment & min assist. PT thoroughly educated pt & wife on recommendation of OPPT to focus on high level balance & reduce fall risk. PT also educated pt on recommendation to use RW at all times for mobility.     Recommendations for follow up therapy are one component of a multi-disciplinary discharge planning process, led by the attending physician.  Recommendations may be updated based on patient status, additional functional criteria and insurance authorization.  Follow Up Recommendations Outpatient PT    Assistance Recommended at Discharge Frequent or constant Supervision/Assistance  Patient can return home with the following  A little help with walking and/or transfers;A little help with bathing/dressing/bathroom;Assistance with cooking/housework;Assist for transportation;Help with stairs or ramp for entrance    Equipment Recommendations None recommended by PT  Recommendations for Other Services       Functional Status Assessment Patient has had a recent decline in their functional status and demonstrates the ability to make significant improvements in function in a reasonable and predictable amount of time.     Precautions / Restrictions Precautions Precautions: Fall Restrictions Weight  Bearing Restrictions: No      Mobility  Bed Mobility Overal bed mobility: Modified Independent             General bed mobility comments: supine>sit with mod I with bed flat    Transfers Overall transfer level: Needs assistance Equipment used: None Transfers: Sit to/from Stand Sit to Stand: Min guard           General transfer comment: STS from EOB with CGA without AD    Ambulation/Gait Ambulation/Gait assistance: Min assist Gait Distance (Feet): 10 Feet Assistive device: None Gait Pattern/deviations: Decreased step length - right, Decreased step length - left       General Gait Details: Pt ambulates 10 ft without AD with min assist. Due to pt's hx of falls, provided pt with RW & pt ambulates 1 lap around nurses station with supervision. Slightly narrow step width. Unsteady gait.  Stairs Stairs: Yes Stairs assistance: Min assist Stair Management: One rail Right, Step to pattern Number of Stairs: 6 General stair comments: PT educates pt to place BUE on R ascending rail/L descending rail & pt able to negotiate stairs in step to pattern with min assist. Wife reports pt negotiates stairs in similar fashion at home already.  Wheelchair Mobility    Modified Rankin (Stroke Patients Only)       Balance Overall balance assessment: History of Falls, Needs assistance Sitting-balance support: Feet supported, Bilateral upper extremity supported Sitting balance-Leahy Scale: Good     Standing balance support: No upper extremity supported, During functional activity Standing balance-Leahy Scale: Poor  Pertinent Vitals/Pain Pain Assessment Pain Assessment: No/denies pain    Home Living Family/patient expects to be discharged to:: Private residence Living Arrangements: Spouse/significant other Available Help at Discharge: Family;Available 24 hours/day Type of Home: House Home Access: Stairs to enter Entrance  Stairs-Rails: Right;Left (wideset) Entrance Stairs-Number of Steps: 4   Home Layout: Able to live on main level with bedroom/bathroom;Multi-level Home Equipment: Conservation officer, nature (2 wheels)      Prior Function Prior Level of Function : History of Falls (last six months)             Mobility Comments: Pt is ambulatory electing to only use RW PRN, endorses at least 6 falls in the past 6 months, doesn't drive. Has received HHPT in the past. Wife provides at minimum supervision for stair negotiation.       Hand Dominance        Extremity/Trunk Assessment   Upper Extremity Assessment Upper Extremity Assessment: Generalized weakness    Lower Extremity Assessment Lower Extremity Assessment: Generalized weakness    Cervical / Trunk Assessment Cervical / Trunk Assessment:  (bruising noted to L eye)  Communication   Communication: No difficulties  Cognition Arousal/Alertness: Awake/alert Behavior During Therapy: WFL for tasks assessed/performed Overall Cognitive Status: Within Functional Limits for tasks assessed                                          General Comments General comments (skin integrity, edema, etc.): Pt on room air throughout session, lowest SpO2 89% but otherwise >/= 90% with no c/o SOB.    Exercises     Assessment/Plan    PT Assessment Patient needs continued PT services  PT Problem List Decreased balance;Decreased safety awareness;Decreased mobility;Decreased knowledge of use of DME       PT Treatment Interventions DME instruction;Therapeutic exercise;Gait training;Balance training;Stair training;Neuromuscular re-education;Functional mobility training;Patient/family education    PT Goals (Current goals can be found in the Care Plan section)  Acute Rehab PT Goals Patient Stated Goal: go home, no f/u therapy PT Goal Formulation: With patient Time For Goal Achievement: 01/01/22 Potential to Achieve Goals: Fair    Frequency Min  2X/week     Co-evaluation               AM-PAC PT "6 Clicks" Mobility  Outcome Measure Help needed turning from your back to your side while in a flat bed without using bedrails?: None Help needed moving from lying on your back to sitting on the side of a flat bed without using bedrails?: None Help needed moving to and from a bed to a chair (including a wheelchair)?: A Little Help needed standing up from a chair using your arms (e.g., wheelchair or bedside chair)?: A Little Help needed to walk in hospital room?: A Little Help needed climbing 3-5 steps with a railing? : A Little 6 Click Score: 20    End of Session   Activity Tolerance: Patient tolerated treatment well Patient left: in bed;with call bell/phone within reach;with bed alarm set;with family/visitor present Nurse Communication: Mobility status (O2) PT Visit Diagnosis: Unsteadiness on feet (R26.81);History of falling (Z91.81)    Time: [id number] PT Time Calculation (min) (ACUTE ONLY): 17 min   Charges:   PT Evaluation $PT Eval Low Complexity: [place], PT, DPT 12/18/21, 12:19 PM   Waunita Schooner 12/18/2021, 12:15  PM

## 2021-12-20 ENCOUNTER — Telehealth: Payer: Self-pay

## 2021-12-20 LAB — BODY FLUID CULTURE W GRAM STAIN: Culture: NO GROWTH

## 2021-12-20 LAB — PROTEIN, BODY FLUID (OTHER): Total Protein, Body Fluid Other: 2 g/dL

## 2021-12-20 LAB — PATHOLOGIST SMEAR REVIEW

## 2021-12-20 LAB — CYTOLOGY - NON PAP

## 2021-12-20 NOTE — Telephone Encounter (Signed)
Can we reach out and cancel his lymph pump

## 2021-12-20 NOTE — Telephone Encounter (Signed)
Per Sheppard Plumber NP, this patient's lymph pump will be canceled. The cancel request has been sent to [facility] with [employer]. See notes below.

## 2021-12-20 NOTE — Telephone Encounter (Signed)
He has scheduled HFU

## 2021-12-20 NOTE — Telephone Encounter (Signed)
Transition Care Management Follow-up Telephone Call Date of discharge and from where: 12/19/21 [facility] How have you been since you were released from the hospital? Per pt's partner Rise Paganini, pt has been doing okay but still disoriented with spells of lashing out and also spells of being more mellow.  Any questions or concerns? Yes - Rise Paganini concerned about pt's oxygen levels and weight loss. She is keeping records and will bring to his appt   Items Reviewed: Did the pt receive and understand the discharge instructions provided? Yes  Medications obtained and verified? Yes  Other? No  Any new allergies since your discharge? No  Dietary orders reviewed? Yes Do you have support at home? Yes   Home Care and Equipment/Supplies: Were home health services ordered? No  Were any new equipment or medical supplies ordered?  No   Functional Questionnaire: (I = Independent and D = Dependent) ADLs: I  Bathing/Dressing- I  Meal Prep- D  Eating- I  Maintaining continence- I  Transferring/Ambulation- I  Managing Meds- D  Follow up appointments reviewed:  PCP Hospital f/u appt confirmed? Yes  Scheduled to see Dr. Parks Ranger on 12/23/21 @ 2:40. [facility] f/u appt confirmed? Yes  Scheduled to see electrophysiology Dr. Quentin Ore on 01/25/22. Are transportation arrangements needed? No  If their condition worsens, is the pt aware to call PCP or go to the Emergency Dept.? Yes Was the patient provided with contact information for the PCP's office or ED? Yes Was to pt encouraged to call back with questions or concerns? Yes

## 2021-12-21 ENCOUNTER — Telehealth: Payer: Self-pay

## 2021-12-21 LAB — BODY FLUID CULTURE W GRAM STAIN: Culture: NO GROWTH

## 2021-12-21 NOTE — Telephone Encounter (Signed)
error 

## 2021-12-22 ENCOUNTER — Ambulatory Visit: Payer: Medicare PPO | Admitting: Gastroenterology

## 2021-12-22 ENCOUNTER — Encounter: Payer: Self-pay | Admitting: Gastroenterology

## 2021-12-22 VITALS — BP 122/81 | HR 67 | Temp 97.7°F | Ht 75.0 in | Wt 209.0 lb

## 2021-12-22 DIAGNOSIS — I8500 Esophageal varices without bleeding: Secondary | ICD-10-CM | POA: Diagnosis not present

## 2021-12-22 DIAGNOSIS — K7031 Alcoholic cirrhosis of liver with ascites: Secondary | ICD-10-CM

## 2021-12-22 LAB — MISC LABCORP TEST (SEND OUT): Labcorp test code: 9985

## 2021-12-22 NOTE — Progress Notes (Signed)
1 

## 2021-12-22 NOTE — Progress Notes (Signed)
Gastroenterology Consultation  Referring Provider:     Saralyn Pilar * Primary Care Physician:  Smitty Cords, DO Primary Gastroenterologist:  Dr. Servando Snare     Reason for Consultation:     Cirrhosis        HPI:   Craig [name] is a 81 y.o. y/o male referred for consultation & management of Cirrhosis by Dr. Althea Charon, Netta Neat, DO.  This patient comes in today with a history of extensive alcohol use for many years.  The patient reports that he had been a daily drinker up until last week.  The patient had imaging that showed him to have a nodular liver consistent with cirrhosis.  The patient also has had long-standing elevation of his liver enzymes with his AST being higher than his ALT. His AST was elevated back in September 2022 and also in December 2020. The patient had a paracentesis and was put on Aldactone and Lasix and reports a massive amount of weight loss.  The patient has not known that he was having liver issues until recently.  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  The patient denies any black stools or bloody stools or any hematemesis.  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 He reports his workup for his liver started after he had fallen out of bed.  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 Authorizing Provider  atorvastatin (LIPITOR) 40 MG tablet  TAKE 1 TABLET (40 MG TOTAL) BY MOUTH DAILY. 02/10/21   Karamalegos, Netta Neat, DO  cholecalciferol (VITAMIN D) 1000 UNITS tablet Take 1,000 Units by mouth daily.    [provider]  donepezil (ARICEPT) 5 MG tablet Take 1 tablet (5 mg total) by mouth at bedtime. 12/08/21   Karamalegos, Netta Neat, DO  escitalopram (LEXAPRO) 20 MG tablet TAKE 1 TABLET EVERY DAY 09/20/21   Karamalegos, Netta Neat, DO  fluticasone (FLONASE) 50 MCG/ACT nasal spray Place 2 sprays into both nostrils daily. Use for 4-6 weeks then stop and use seasonally or as needed. 12/08/21   Karamalegos, Netta Neat, DO  furosemide (LASIX) 40 MG tablet Take 1 tablet (40 mg total) by mouth daily as needed for fluid or edema. 12/18/21   Wouk, Wilfred Curtis, MD  Melatonin 5 MG TABS Take 10 mg by mouth at bedtime. 3 tabs    [provider]  metoprolol succinate (TOPROL-XL) 25 MG 24 hr tablet TAKE 1 TABLET (25 MG TOTAL) BY MOUTH DAILY. 01/26/21   Althea Charon, Netta Neat, DO  omeprazole (PRILOSEC) 40 MG capsule TAKE 1 CAPSULE EVERY DAY 08/27/21   Karamalegos, Netta Neat, DO  spironolactone (ALDACTONE) 100 MG tablet Take 1 tablet (100 mg total) by mouth daily. 12/19/21   Wouk, Wilfred Curtis, MD  vitamin B-12 (CYANOCOBALAMIN) 500 MCG tablet Take 500 mcg by mouth daily.    [provider]  vitamin E 1000 UNIT capsule Take 1,000 Units by mouth daily.    [provider]  XARELTO 20 MG TABS tablet TAKE 1 TABLET (20 MG TOTAL) BY MOUTH DAILY WITH SUPPER. 02/10/21   Karamalegos, Netta Neat, DO    Family History  Problem Relation Age of Onset   CAD Father 59   Kidney failure Mother    CAD Brother      Social History   Tobacco Use   Smoking status: Former    Years: 20.00    Types: Cigarettes    Quit date: 07/24/1978    Years since quitting: 43.4   Smokeless tobacco: Former   Tobacco comments:    Quit tobacco in 1980  Vaping Use   Vaping Use: Never used  Substance Use Topics   Alcohol use: Yes     Alcohol/week: 3.0 standard drinks    Types: 3 Shots of liquor per week    Comment: daily   Drug use: No    Allergies as of 12/22/2021 - Review Complete 12/20/2021  Allergen Reaction Noted   Trazodone and nefazodone Itching and Swelling 05/08/2017    Review of Systems:    All systems reviewed and negative except where noted in HPI.   Physical Exam:  BP 122/81   Pulse 67   Temp 97.7 F (36.5 C) (Oral)   Ht 6\' 3"  (1.905 m)   Wt 209 lb (94.8 kg)   BMI 26.12 kg/m  No LMP for male patient. General:   Alert,  Well-developed, well-nourished, pleasant and cooperative in NAD Head:  The patient has a laceration of his head at the level of his eyebrow on the left. The patient also has black eyes. Eyes:  Sclera clear, no icterus.   Conjunctiva pink. Ears:  Normal auditory acuity. Neck:  Supple; no masses or thyromegaly. Lungs:  Respirations even and unlabored.  Clear throughout to auscultation.   No wheezes, crackles, or rhonchi. No acute distress. Heart:  Regular rate and rhythm; no murmurs, clicks, rubs, or gallops. Abdomen:  Normal bowel sounds.  No bruits.  Soft, non-tender and non-distended without masses, hepatosplenomegaly or hernias noted.  No guarding or rebound tenderness.  Negative Carnett sign.   Rectal:  Deferred.  Pulses:  Normal pulses noted. Extremities:  No clubbing or edema.  No cyanosis. Neurologic:  Alert and oriented x3;  grossly normal neurologically. Skin:  Multiple bruises and ecchymosis on his arms and face Lymph Nodes:  No significant cervical adenopathy. Psych:  Alert and cooperative. Normal mood and affect.  Imaging Studies: DG Chest 2 View  Result Date: 12/15/2021 CLINICAL DATA:  Shortness of breath, fall EXAM: CHEST - 2 VIEW COMPARISON:  Previous studies including the examination of 04/06/2021 FINDINGS: Transverse diameter of heart is increased. There are no signs of alveolar pulmonary edema. Increased density is seen in the medial left lower lung fields.  Increased markings are seen in the right lower lung fields. Costophrenic angles are clear. There is no pneumothorax. IMPRESSION: Increased density in the medial left lower lung fields may suggest atelectasis/pneumonia. Increased interstitial markings in the right lower lung fields may suggest scarring and/or atelectasis/pneumonia. There are no signs of alveolar pulmonary edema. Electronically Signed   By: 04/08/2021 M.D.   On: 12/15/2021 16:01   CT Head Wo Contrast  Result Date: 12/15/2021 CLINICAL DATA:  Fall, blunt facial trauma, laceration to forehead, history dementia and recurrent falls, hypertension, stroke EXAM: CT HEAD WITHOUT CONTRAST CT MAXILLOFACIAL WITHOUT CONTRAST CT CERVICAL  SPINE WITHOUT CONTRAST TECHNIQUE: Multidetector CT imaging of the head, cervical spine, and maxillofacial structures were performed using the standard protocol without intravenous contrast. Multiplanar CT image reconstructions of the cervical spine and maxillofacial structures were also generated. Right side of face marked with BB. RADIATION DOSE REDUCTION: This exam was performed according to the departmental dose-optimization program which includes automated exposure control, adjustment of the mA and/or kV according to patient size and/or use of iterative reconstruction technique. COMPARISON:  CT head 04/06/2021 FINDINGS: CT HEAD FINDINGS Brain: Generalized atrophy. Normal ventricular morphology. No midline shift or mass effect. Small vessel chronic ischemic changes of deep cerebral white matter. Small nonspecific parenchymal calcification RIGHT frontal lobe unchanged. Otherwise normal appearance of brain parenchyma. No intracranial hemorrhage, mass lesion, or evidence of acute infarction. No extra-axial fluid collections. Vascular: Mild atherosclerotic calcification of internal carotid arteries at skull base Skull: Intact Other: N/A CT MAXILLOFACIAL FINDINGS Osseous: TMJ alignment normal bilaterally. No facial bone  fractures identified. Orbits: Intraorbital soft tissue planes clear Sinuses: Minimal mucosal thickening in the maxillary sinuses. Remaining paranasal sinuses, visualized mastoid air cells and middle ear cavities clear. Soft tissues: Small frontal scalp contusion. Hematoma LEFT periorbital extending to nasal region. No other facial soft tissue abnormalities. CT CERVICAL SPINE FINDINGS Alignment: Normal Skull base and vertebrae: Osseous mineralization normal. Skull base intact. Vertebral body heights maintained. Multilevel disc space narrowing and endplate spur formation. No fracture, subluxation, or bone destruction. Minor facet degenerative changes. Soft tissues and spinal canal: Prevertebral soft tissues normal thickness. Atherosclerotic calcifications at carotid bifurcations cervical soft tissues otherwise unremarkable. Disc levels:  No specific abnormalities Upper chest: Small BILATERAL pleural effusions at lung apices greater on RIGHT. Other: N/A IMPRESSION: Atrophy with small vessel chronic ischemic changes of deep cerebral white matter. No acute intracranial abnormalities. No acute facial bone abnormalities. 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 No acute cervical spine abnormalities. Small BILATERAL pleural effusions greater on RIGHT. Electronically Signed   By: Ulyses Southward M.D.   On: 12/15/2021 15:59   CT Angio Chest PE W and/or Wo Contrast  Result Date: 12/15/2021 CLINICAL DATA:  Concern for pulmonary embolism. Several falls recently. EXAM: CT ANGIOGRAPHY CHEST CT ABDOMEN AND PELVIS WITH CONTRAST TECHNIQUE: Multidetector CT imaging of the chest was performed using the standard protocol during bolus administration of intravenous contrast. Multiplanar CT image reconstructions and MIPs were obtained to evaluate the vascular anatomy. Multidetector CT imaging of the abdomen and pelvis was performed using the standard protocol during bolus administration of intravenous  contrast. RADIATION DOSE REDUCTION: This exam was performed according to the departmental dose-optimization program which includes automated exposure control, adjustment of the mA and/or kV according to patient size and/or use of iterative reconstruction technique. CONTRAST:  OMNIPAQUE IOHEXOL 350 MG/ML SOLN COMPARISON:  None Available. FINDINGS: CTA CHEST FINDINGS Cardiovascular: There is a thin linear filling defect within the RIGHT lower lobe pulmonary artery. This very thin curvilinear defect is favored residual synechia from remodeled remote thromboembolus. No occlusion. No filling defects within the pulmonary arteries to suggest acute pulmonary embolism. No pericardial fluid. Calcifications of the aortic arch coronary arteries. Mediastinum/Nodes: No axillary or supraclavicular adenopathy. No mediastinal or hilar adenopathy. No pericardial fluid. Esophagus normal. Lungs/Pleura: Moderate bilateral pleural effusions. No pulmonary infarction. Mild bibasilar passive atelectasis. Musculoskeletal: No aggressive osseous lesion Review of the MIP images confirms the above findings. CT ABDOMEN and PELVIS FINDINGS Hepatobiliary: No focal hepatic lesion. Postcholecystectomy. No biliary dilatation. Liver has a lobular contour. Portal  veins are patent. Large volume intraperitoneal free fluid suggest ascites. Pancreas: Pancreas is normal. No ductal dilatation. No pancreatic inflammation. Spleen: Post splenectomy Adrenals/urinary tract: Kidneys enhance symmetrically. Normal adrenal glands. Nonenhancing cysts of the LEFT kidney. Nonobstructing calculus LEFT kidney. Ureters bladder normal. Stomach/Bowel: Stomach, small-bowel and cecum are normal. The appendix is not identified but there is no pericecal inflammation to suggest appendicitis. The colon and rectosigmoid colon are normal. Vascular/Lymphatic: Abdominal aorta is normal caliber with atherosclerotic calcification. There is no retroperitoneal or periportal  lymphadenopathy. No pelvic lymphadenopathy. Reproductive: Prostate normal Other: Intraperitoneal free air. Large volume ascites. Anasarca the soft tissues. Musculoskeletal: No aggressive osseous lesion. Review of the MIP images confirms the above findings. IMPRESSION: Chest Impression: 1. No evidence acute pulmonary embolism. 2. 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 Nonocclusive. 3. Moderate bilateral pleural effusions with passive atelectasis of the lower lobes. Abdomen / Pelvis Impression: 1. Large volume of intraperitoneal free fluid in the abdomen pelvis suggest ascites. 2. Portal veins are patent.  Lobular margin of the liver. 3. Post splenectomy. 4. Anasarca the subcutaneous tissues in the abdomen pelvis. Electronically Signed   By: Genevive Bi M.D.   On: 12/15/2021 16:59   CT Cervical Spine Wo Contrast  Result Date: 12/15/2021 CLINICAL DATA:  Fall, blunt facial trauma, laceration to forehead, history dementia and recurrent falls, hypertension, stroke EXAM: CT HEAD WITHOUT CONTRAST CT MAXILLOFACIAL WITHOUT CONTRAST CT CERVICAL SPINE WITHOUT CONTRAST TECHNIQUE: Multidetector CT imaging of the head, cervical spine, and maxillofacial structures were performed using the standard protocol without intravenous contrast. Multiplanar CT image reconstructions of the cervical spine and maxillofacial structures were also generated. Right side of face marked with BB. RADIATION DOSE REDUCTION: This exam was performed according to the departmental dose-optimization program which includes automated exposure control, adjustment of the mA and/or kV according to patient size and/or use of iterative reconstruction technique. COMPARISON:  CT head 04/06/2021 FINDINGS: CT HEAD FINDINGS Brain: Generalized atrophy. Normal ventricular morphology. No midline shift or mass effect. Small vessel chronic ischemic changes of deep cerebral white matter. Small nonspecific parenchymal  calcification RIGHT frontal lobe unchanged. Otherwise normal appearance of brain parenchyma. No intracranial hemorrhage, mass lesion, or evidence of acute infarction. No extra-axial fluid collections. Vascular: Mild atherosclerotic calcification of internal carotid arteries at skull base Skull: Intact Other: N/A CT MAXILLOFACIAL FINDINGS Osseous: TMJ alignment normal bilaterally. No facial bone fractures identified. Orbits: Intraorbital soft tissue planes clear Sinuses: Minimal mucosal thickening in the maxillary sinuses. Remaining paranasal sinuses, visualized mastoid air cells and middle ear cavities clear. Soft tissues: Small frontal scalp contusion. Hematoma LEFT periorbital extending to nasal region. No other facial soft tissue abnormalities. CT CERVICAL SPINE FINDINGS Alignment: Normal Skull base and vertebrae: Osseous mineralization normal. Skull base intact. Vertebral body heights maintained. Multilevel disc space narrowing and endplate spur formation. No fracture, subluxation, or bone destruction. Minor facet degenerative changes. Soft tissues and spinal canal: Prevertebral soft tissues normal thickness. Atherosclerotic calcifications at carotid bifurcations cervical soft tissues otherwise unremarkable. Disc levels:  No specific abnormalities Upper chest: Small BILATERAL pleural effusions at lung apices greater on RIGHT. Other: N/A IMPRESSION: Atrophy with small vessel chronic ischemic changes of deep cerebral white matter. No acute intracranial abnormalities. No acute facial bone abnormalities. 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 No acute cervical spine abnormalities. Small BILATERAL pleural effusions greater on RIGHT. Electronically Signed   By: Ulyses Southward M.D.   On: 12/15/2021 15:59   CT ABDOMEN  PELVIS W CONTRAST  Result Date: 12/15/2021 CLINICAL DATA:  Concern for pulmonary embolism. Several falls recently. EXAM: CT ANGIOGRAPHY CHEST CT ABDOMEN AND PELVIS  WITH CONTRAST TECHNIQUE: Multidetector CT imaging of the chest was performed using the standard protocol during bolus administration of intravenous contrast. Multiplanar CT image reconstructions and MIPs were obtained to evaluate the vascular anatomy. Multidetector CT imaging of the abdomen and pelvis was performed using the standard protocol during bolus administration of intravenous contrast. RADIATION DOSE REDUCTION: This exam was performed according to the departmental dose-optimization program which includes automated exposure control, adjustment of the mA and/or kV according to patient size and/or use of iterative reconstruction technique. CONTRAST:  OMNIPAQUE IOHEXOL 350 MG/ML SOLN COMPARISON:  None Available. FINDINGS: CTA CHEST FINDINGS Cardiovascular: There is a thin linear filling defect within the RIGHT lower lobe pulmonary artery. This very thin curvilinear defect is favored residual synechia from remodeled remote thromboembolus. No occlusion. No filling defects within the pulmonary arteries to suggest acute pulmonary embolism. No pericardial fluid. Calcifications of the aortic arch coronary arteries. Mediastinum/Nodes: No axillary or supraclavicular adenopathy. No mediastinal or hilar adenopathy. No pericardial fluid. Esophagus normal. Lungs/Pleura: Moderate bilateral pleural effusions. No pulmonary infarction. Mild bibasilar passive atelectasis. Musculoskeletal: No aggressive osseous lesion Review of the MIP images confirms the above findings. CT ABDOMEN and PELVIS FINDINGS Hepatobiliary: No focal hepatic lesion. Postcholecystectomy. No biliary dilatation. Liver has a lobular contour. Portal veins are patent. Large volume intraperitoneal free fluid suggest ascites. Pancreas: Pancreas is normal. No ductal dilatation. No pancreatic inflammation. Spleen: Post splenectomy Adrenals/urinary tract: Kidneys enhance symmetrically. Normal adrenal glands. Nonenhancing cysts of the LEFT kidney.  Nonobstructing calculus LEFT kidney. Ureters bladder normal. Stomach/Bowel: Stomach, small-bowel and cecum are normal. The appendix is not identified but there is no pericecal inflammation to suggest appendicitis. The colon and rectosigmoid colon are normal. Vascular/Lymphatic: Abdominal aorta is normal caliber with atherosclerotic calcification. There is no retroperitoneal or periportal lymphadenopathy. No pelvic lymphadenopathy. Reproductive: Prostate normal Other: Intraperitoneal free air. Large volume ascites. Anasarca the soft tissues. Musculoskeletal: No aggressive osseous lesion. Review of the MIP images confirms the above findings. IMPRESSION: Chest Impression: 1. No evidence acute pulmonary embolism. 2. 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 Nonocclusive. 3. Moderate bilateral pleural effusions with passive atelectasis of the lower lobes. Abdomen / Pelvis Impression: 1. Large volume of intraperitoneal free fluid in the abdomen pelvis suggest ascites. 2. Portal veins are patent.  Lobular margin of the liver. 3. Post splenectomy. 4. Anasarca the subcutaneous tissues in the abdomen pelvis. Electronically Signed   By: Genevive Bi M.D.   On: 12/15/2021 16:59   US Venous Img Lower Bilateral  Result Date: 12/15/2021 CLINICAL DATA:  Swelling on the RIGHT greater than LEFT of the lower extremities. EXAM: Bilateral LOWER EXTREMITY VENOUS DOPPLER ULTRASOUND TECHNIQUE: Gray-scale sonography with compression, as well as color and duplex ultrasound, were performed to evaluate the deep venous system(s) from the level of the common femoral vein through the popliteal and proximal calf veins. COMPARISON:  None Available. FINDINGS: VENOUS Normal compressibility of the common femoral, superficial femoral, and popliteal veins, as well as the visualized calf veins. Visualized portions of profunda femoral vein and great saphenous vein unremarkable. No filling defects to suggest DVT  on grayscale or color Doppler imaging. Doppler waveforms show normal direction of venous flow, normal respiratory plasticity and response to augmentation. OTHER Signs of edema in the subcutaneous fat of the bilateral lower extremities. Limitations: none IMPRESSION: No sonographic  evidence of DVT in the RIGHT or LEFT lower extremity with evidence of lower extremity edema in the subcutaneous fat. Electronically Signed   By: Donzetta KohutGeoffrey  Wile M.D.   On: 12/15/2021 17:09   US Paracentesis  Result Date: 12/16/2021 INDICATION: Patient with history of daily ETOH use who fell at home today, CT shows new onset ascites. Request for diagnostic and therapeutic paracentesis. EXAM: ULTRASOUND GUIDED DIAGNOSTIC AND THERAPEUTIC PARACENTESIS MEDICATIONS: 8 mL 1% lidocaine COMPLICATIONS: None immediate. PROCEDURE: Informed written consent was obtained from the patient after a discussion of the risks, benefits and alternatives to treatment. A timeout was performed prior to the initiation of the procedure. Initial ultrasound scanning demonstrates a large amount of ascites within the right upper abdominal quadrant. The right upper abdomen was prepped and draped in the usual sterile fashion. 1% lidocaine was used for local anesthesia. Following this, a 6 Fr Safe-T-Centesis catheter was introduced. An ultrasound image was saved for documentation purposes. The paracentesis was performed. The catheter was removed and a dressing was applied. The patient tolerated the procedure well without immediate post procedural complication. FINDINGS: A total of approximately 2.8 L of clear yellow fluid was removed. Samples were sent to the laboratory as requested by the clinical team. IMPRESSION: Successful ultrasound-guided paracentesis yielding 2.8 liters of peritoneal fluid. Read by Lynnette CaffeyShannon Watterson, PA-C Electronically Signed   By: Olive BassYasser  El-Abd M.D.   On: 12/16/2021 11:57   DG Chest Port 1 View  Result Date: 12/17/2021 CLINICAL DATA:  Status  post right thoracentesis. EXAM: PORTABLE CHEST 1 VIEW COMPARISON:  12/15/2021 and older exams. FINDINGS: Decreased right lung base opacity consistent with the interval thoracentesis. No visible residual fluid. Opacity at the medial left lung base is consistent with a combination of a residual small effusion and atelectasis. Lungs demonstrate chronic interstitial thickening and mild apical scarring, but are otherwise clear. No pneumothorax. IMPRESSION: 1. Significant decrease in right pleural effusion following thoracentesis. No pneumothorax or other evidence of a procedure complication. Electronically Signed   By: Amie Portlandavid  Ormond M.D.   On: 12/17/2021 14:58   ECHOCARDIOGRAM COMPLETE  Result Date: 12/16/2021    ECHOCARDIOGRAM REPORT   Patient Name:   Arty BaumgartnerJAMES L [name] Date of Exam: 12/16/2021 Medical Rec #:  [id number]     Height:       75.0 in Accession #:    [id number]    Weight:       252.6 lb Date of Birth:  10/29/1940      BSA:          2.423 m Patient Age:    80 years      BP:           117/69 mmHg Patient Gender: M             HR:           60 bpm. Exam Location:  [facility] Procedure: 2D Echo, Cardiac Doppler and Color Doppler Indications:     CHF-acute systolic I50.21  History:         Patient has prior history of Echocardiogram examinations, most                  recent 05/25/2017. Stroke; Risk Factors:Hypertension. Pulmonary                  emboli.  Sonographer:     Cristela BlueJerry Hege Referring Phys:  [id number] Eliezer MccoyEKTA V PATEL Diagnosing Phys: Julien Nordmannimothy Gollan MD  Sonographer Comments: Suboptimal apical window. IMPRESSIONS  1. Left  ventricular ejection fraction, by estimation, is 60 to 65%. The left ventricle has normal function. The left ventricle has no regional wall motion abnormalities. Left ventricular diastolic parameters are indeterminate.  2. Right ventricular systolic function is normal. The right ventricular size is moderately enlarged. There is normal pulmonary artery systolic pressure. The estimated right ventricular  systolic pressure is 24.5 mmHg.  3. Left atrial size was severely dilated.  4. Right atrial size was severely dilated.  5. Large pleural effusion in the left lateral region.  6. The mitral valve is normal in structure. Mild mitral valve regurgitation. No evidence of mitral stenosis.  7. The aortic valve is normal in structure. There is moderate calcification of the aortic valve. Aortic valve regurgitation is not visualized. No aortic stenosis is present.  8. The inferior vena cava is normal in size with <50% respiratory variability, suggesting right atrial pressure of 8 mmHg. FINDINGS  Left Ventricle: Left ventricular ejection fraction, by estimation, is 60 to 65%. The left ventricle has normal function. The left ventricle has no regional wall motion abnormalities. The left ventricular internal cavity size was normal in size. There is  no left ventricular hypertrophy. Left ventricular diastolic parameters are indeterminate. Right Ventricle: The right ventricular size is moderately enlarged. No increase in right ventricular wall thickness. Right ventricular systolic function is normal. There is normal pulmonary artery systolic pressure. The tricuspid regurgitant velocity is 2.21 m/s, and with an assumed right atrial pressure of 5 mmHg, the estimated right ventricular systolic pressure is 24.5 mmHg. Left Atrium: Left atrial size was severely dilated. Right Atrium: Right atrial size was severely dilated. Pericardium: There is no evidence of pericardial effusion. Mitral Valve: The mitral valve is normal in structure. Mild mitral valve regurgitation. No evidence of mitral valve stenosis. Tricuspid Valve: The tricuspid valve is normal in structure. Tricuspid valve regurgitation is mild . No evidence of tricuspid stenosis. Aortic Valve: The aortic valve is normal in structure. There is moderate calcification of the aortic valve. Aortic valve regurgitation is not visualized. No aortic stenosis is present. Aortic valve mean  gradient measures 2.0 mmHg. Aortic valve peak gradient measures 4.0 mmHg. Aortic valve area, by VTI measures 3.01 cm. Pulmonic Valve: The pulmonic valve was normal in structure. Pulmonic valve regurgitation is not visualized. No evidence of pulmonic stenosis. Aorta: The aortic root is normal in size and structure. Venous: The inferior vena cava is normal in size with less than 50% respiratory variability, suggesting right atrial pressure of 8 mmHg. IAS/Shunts: No atrial level shunt detected by color flow Doppler. Additional Comments: There is a large pleural effusion in the left lateral region.  LEFT VENTRICLE PLAX 2D LVIDd:         5.60 cm LVIDs:         3.80 cm LV PW:         1.30 cm LV IVS:        0.80 cm LVOT diam:     2.00 cm LV SV:         56 LV SV Index:   23 LVOT Area:     3.14 cm  LEFT ATRIUM              Index        RIGHT ATRIUM           Index LA diam:        5.60 cm  2.31 cm/m   RA Area:     43.30 cm LA Vol (A2C):  203.0 ml 83.77 ml/m  RA Volume:   184.00 ml 75.93 ml/m LA Vol (A4C):   141.0 ml 58.19 ml/m LA Biplane Vol: 170.0 ml 70.15 ml/m  AORTIC VALVE                    PULMONIC VALVE AV Area (Vmax):    2.95 cm     PV Vmax:        0.87 m/s AV Area (Vmean):   2.91 cm     PV Vmean:       59.300 cm/s AV Area (VTI):     3.01 cm     PV VTI:         0.155 m AV Vmax:           100.00 cm/s  PV Peak grad:   3.0 mmHg AV Vmean:          62.700 cm/s  PV Mean grad:   2.0 mmHg AV VTI:            0.185 m      RVOT Peak grad: 4 mmHg AV Peak Grad:      4.0 mmHg AV Mean Grad:      2.0 mmHg LVOT Vmax:         94.00 cm/s LVOT Vmean:        58.000 cm/s LVOT VTI:          0.177 m LVOT/AV VTI ratio: 0.96  AORTA Ao Root diam: 3.53 cm MITRAL VALVE                TRICUSPID VALVE MV Area (PHT): 3.83 cm     TR Peak grad:   19.5 mmHg MV Decel Time: 198 msec     TR Vmax:        221.00 cm/s MV E velocity: 105.00 cm/s                             SHUNTS                             Systemic VTI:  0.18 m                              Systemic Diam: 2.00 cm                             Pulmonic VTI:  0.209 m Julien Nordmann MD Electronically signed by Julien Nordmann MD Signature Date/Time: 12/16/2021/3:14:47 PM    Final    CT Maxillofacial Wo Contrast  Result Date: 12/15/2021 CLINICAL DATA:  Fall, blunt facial trauma, laceration to forehead, history dementia and recurrent falls, hypertension, stroke EXAM: CT HEAD WITHOUT CONTRAST CT MAXILLOFACIAL WITHOUT CONTRAST CT CERVICAL SPINE WITHOUT CONTRAST TECHNIQUE: Multidetector CT imaging of the head, cervical spine, and maxillofacial structures were performed using the standard protocol without intravenous contrast. Multiplanar CT image reconstructions of the cervical spine and maxillofacial structures were also generated. Right side of face marked with BB. RADIATION DOSE REDUCTION: This exam was performed according to the departmental dose-optimization program which includes automated exposure control, adjustment of the mA and/or kV according to patient size and/or use of iterative reconstruction technique. COMPARISON:  CT head 04/06/2021 FINDINGS: CT HEAD FINDINGS Brain: Generalized atrophy. Normal ventricular morphology. No midline shift or mass  effect. Small vessel chronic ischemic changes of deep cerebral white matter. Small nonspecific parenchymal calcification RIGHT frontal lobe unchanged. Otherwise normal appearance of brain parenchyma. No intracranial hemorrhage, mass lesion, or evidence of acute infarction. No extra-axial fluid collections. Vascular: Mild atherosclerotic calcification of internal carotid arteries at skull base Skull: Intact Other: N/A CT MAXILLOFACIAL FINDINGS Osseous: TMJ alignment normal bilaterally. No facial bone fractures identified. Orbits: Intraorbital soft tissue planes clear Sinuses: Minimal mucosal thickening in the maxillary sinuses. Remaining paranasal sinuses, visualized mastoid air cells and middle ear cavities clear. Soft tissues: Small frontal scalp  contusion. Hematoma LEFT periorbital extending to nasal region. No other facial soft tissue abnormalities. CT CERVICAL SPINE FINDINGS Alignment: Normal Skull base and vertebrae: Osseous mineralization normal. Skull base intact. Vertebral body heights maintained. Multilevel disc space narrowing and endplate spur formation. No fracture, subluxation, or bone destruction. Minor facet degenerative changes. Soft tissues and spinal canal: Prevertebral soft tissues normal thickness. Atherosclerotic calcifications at carotid bifurcations cervical soft tissues otherwise unremarkable. Disc levels:  No specific abnormalities Upper chest: Small BILATERAL pleural effusions at lung apices greater on RIGHT. Other: N/A IMPRESSION: Atrophy with small vessel chronic ischemic changes of deep cerebral white matter. No acute intracranial abnormalities. No acute facial bone abnormalities. 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 No acute cervical spine abnormalities. Small BILATERAL pleural effusions greater on RIGHT. Electronically Signed   By: Ulyses Southward M.D.   On: 12/15/2021 15:59   US Abdomen Limited RUQ (LIVER/GB)  Result Date: 12/16/2021 CLINICAL DATA:  Ascites, possible cirrhosis EXAM: ULTRASOUND ABDOMEN LIMITED RIGHT UPPER QUADRANT COMPARISON:  None Available. FINDINGS: Gallbladder: There is a 1.2 cm gallstone at the neck. No wall thickening visualized. No sonographic Murphy sign noted by sonographer. Common bile duct: Diameter: 6 mm, within normal limits Liver: No focal lesion identified. Increased parenchymal echogenicity. Nodular surface contour. Portal vein is patent on color Doppler imaging with normal direction of blood flow towards the liver. Other: Ascites and right pleural effusion. IMPRESSION: Nodular liver surface contour suggesting cirrhosis. Increased liver echogenicity may reflect chronic liver disease or steatosis. Ascites is present suggesting portal hypertension.  Cholelithiasis without sonographic evidence of acute cholecystitis. Right pleural effusion. Electronically Signed   By: Guadlupe Spanish M.D.   On: 12/16/2021 10:47   US THORACENTESIS ASP PLEURAL SPACE W/IMG GUIDE  Result Date: 12/18/2021 INDICATION: Patient history of daily ETOH use, recent fall. CTA chest shows new right pleural effusion. Request IR for diagnostic and therapeutic right thoracentesis. EXAM: ULTRASOUND GUIDED RIGHT THORACENTESIS MEDICATIONS: 8 mL 1% lidocaine COMPLICATIONS: None immediate. PROCEDURE: An ultrasound guided thoracentesis was thoroughly discussed with the patient and questions answered. The benefits, risks, alternatives and complications were also discussed. The patient understands and wishes to proceed with the procedure. Written consent was obtained. Ultrasound was performed to localize and mark an adequate pocket of fluid in the right chest. The area was then prepped and draped in the normal sterile fashion. 1% Lidocaine was used for local anesthesia. Under ultrasound guidance a 6 Fr Safe-T-Centesis catheter was introduced. Thoracentesis was performed. The catheter was removed and a dressing applied. FINDINGS: A total of approximately 1.0 L of clear yellow fluid was removed. Samples were sent to the laboratory as requested by the clinical team. IMPRESSION: Successful ultrasound guided right thoracentesis yielding 1.0 L of pleural fluid. Read by Lynnette Caffey, PA-C Electronically Signed   By: Acquanetta Belling M.D.   On: 12/18/2021 06:06    Assessment and Plan:   Craig Aliment  [name] is a 81 y.o. y/o male who comes in today with a history of extensive alcohol use and abuse and now presents with decompensated liver cirrhosis. 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  The patient has been told to avoid iron supplementation and has  also been told that he needs an upper endoscopy to rule out varices.  He has been having some symptoms with the diuretics and has been told to decrease the dose to see if that makes his symptoms go away.  He has also been told that he needs a blood test for alpha-fetoprotein.  The patient will be notified of the lab results when they are back.  The patient has been explained the plan and the gravity of his situation.  He agrees with the plan.    Midge Minium, MD. Clementeen Graham    Note: This dictation was prepared with Dragon dictation along with smaller phrase technology. Any transcriptional errors that result from this process are unintentional.

## 2021-12-23 ENCOUNTER — Ambulatory Visit: Payer: Medicare PPO | Admitting: Family Medicine

## 2021-12-23 ENCOUNTER — Encounter: Payer: Self-pay | Admitting: Family Medicine

## 2021-12-23 ENCOUNTER — Other Ambulatory Visit: Payer: Self-pay | Admitting: Family Medicine

## 2021-12-23 VITALS — BP 120/60 | HR 64 | Ht 75.0 in | Wt 205.8 lb

## 2021-12-23 DIAGNOSIS — I4819 Other persistent atrial fibrillation: Secondary | ICD-10-CM

## 2021-12-23 DIAGNOSIS — K7031 Alcoholic cirrhosis of liver with ascites: Secondary | ICD-10-CM | POA: Insufficient documentation

## 2021-12-23 LAB — CHOLESTEROL, BODY FLUID: Cholesterol, Fluid: 27 mg/dL

## 2021-12-23 LAB — AFP TUMOR MARKER: AFP, Serum, Tumor Marker: <1.8 ng/mL (ref 0.0–8.4)

## 2021-12-23 NOTE — Progress Notes (Signed)
Subjective:    Patient ID: Craig [name], male    DOB: [birth year], 81 y.o.   MRN: [id number]  Craig [name] is a 81 y.o. male presenting on 12/23/2021 for Hospitalization Follow-up   HPI  HOSPITAL FOLLOW-UP VISIT  Hospital/Location: [facility] Date of Admission: 12/15/21 Date of Discharge: 12/18/21 Transitions of care telephone call: Completed 12/20/21 Reather Littler LPN  Reason for Admission: Fall/Injury, Cirrhosis  - Hospital H&P and Discharge Summary have been reviewed - Patient presents today 5 days after recent hospitalization. Brief summary of recent course, patient had symptoms of fall injury laceration eyebrow and bruising, significant swelling edema, hospitalized and work up showed cirrhosis on imaging, likely due to alcohol chronic use, and ascites. He was treated with diuretic regimen had consultation with GI. Found to have AFib as well continued on Metoprolol and Xarelto. Has upcoming outpatient apt with [facility] Cardiology Dr Lalla Brothers. Also had apt with [facility] Dr Servando Snare further work up proceeding and management of diuretics, has lost 50 lbs in few days with treatment.  Lab for TSH normal.  Persistent Atrial Fibrillation - no elevated HR prior EKG without AFib years past.  has upcoming endoscopy  PMH Blood clots  Stitches placed L upper eye  - Today reports overall has done well after discharge. Symptoms of edema have improved.  - New medications on discharge: spironolactone - Changes to current meds on discharge:   I have reviewed the discharge medication list, and have reconciled the current and discharge medications today.   Current Outpatient Medications:    atorvastatin (LIPITOR) 40 MG tablet, TAKE 1 TABLET (40 MG TOTAL) BY MOUTH DAILY., Disp: 90 tablet, Rfl: 3   cholecalciferol (VITAMIN D) 1000 UNITS tablet, Take 1,000 Units by mouth daily., Disp: , Rfl:    donepezil (ARICEPT) 5 MG tablet, Take 1 tablet (5 mg total) by mouth at bedtime., Disp: 30 tablet, Rfl: 2   escitalopram  (LEXAPRO) 20 MG tablet, TAKE 1 TABLET EVERY DAY, Disp: 90 tablet, Rfl: 1   fluticasone (FLONASE) 50 MCG/ACT nasal spray, Place 2 sprays into both nostrils daily. Use for 4-6 weeks then stop and use seasonally or as needed., Disp: 16 g, Rfl: 3   furosemide (LASIX) 40 MG tablet, Take 1 tablet (40 mg total) by mouth daily as needed for fluid or edema., Disp: 90 tablet, Rfl: 0   Melatonin 5 MG TABS, Take 10 mg by mouth at bedtime. 3 tabs, Disp: , Rfl:    metoprolol succinate (TOPROL-XL) 25 MG 24 hr tablet, TAKE 1 TABLET (25 MG TOTAL) BY MOUTH DAILY., Disp: 90 tablet, Rfl: 3   omeprazole (PRILOSEC) 40 MG capsule, TAKE 1 CAPSULE EVERY DAY, Disp: 90 capsule, Rfl: 3   spironolactone (ALDACTONE) 100 MG tablet, Take 1 tablet (100 mg total) by mouth daily., Disp: 90 tablet, Rfl: 1   vitamin B-12 (CYANOCOBALAMIN) 500 MCG tablet, Take 500 mcg by mouth daily., Disp: , Rfl:    vitamin E 1000 UNIT capsule, Take 1,000 Units by mouth daily., Disp: , Rfl:    XARELTO 20 MG TABS tablet, TAKE 1 TABLET (20 MG TOTAL) BY MOUTH DAILY WITH SUPPER., Disp: 90 tablet, Rfl: 3  ------------------------------------------------------------------------- Social History   Tobacco Use   Smoking status: Former    Years: 20.00    Types: Cigarettes    Quit date: 07/24/1978    Years since quitting: 43.4   Smokeless tobacco: Former   Tobacco comments:    Quit tobacco in 1980  Vaping Use   Vaping Use:  Never used  Substance Use Topics   Alcohol use: Yes    Alcohol/week: 3.0 standard drinks    Types: 3 Shots of liquor per week    Comment: daily   Drug use: No    Review of Systems Per HPI unless specifically indicated above     Objective:    BP 120/60   Pulse 64   Ht 6\' 3"  (1.905 m)   Wt 205 lb 12.8 oz (93.4 kg)   SpO2 94%   BMI 25.72 kg/m   Wt Readings from Last 3 Encounters:  12/23/21 205 lb 12.8 oz (93.4 kg)  12/22/21 209 lb (94.8 kg)  12/15/21 252 lb 9.3 oz (114.6 kg)    Physical Exam Vitals and nursing note  reviewed.  Constitutional:      General: He is not in acute distress.    Appearance: He is well-developed. He is obese. He is not diaphoretic.     Comments: Well-appearing, comfortable, cooperative  HENT:     Head: Normocephalic and atraumatic.  Eyes:     General:        Right eye: No discharge.        Left eye: No discharge.     Conjunctiva/sclera: Conjunctivae normal.  Neck:     Thyroid: No thyromegaly.  Cardiovascular:     Rate and Rhythm: Normal rate and regular rhythm.     Pulses: Normal pulses.     Heart sounds: Normal heart sounds. No murmur heard. Pulmonary:     Effort: Pulmonary effort is normal. No respiratory distress.     Breath sounds: Normal breath sounds. No wheezing or rales.  Musculoskeletal:        General: Normal range of motion.     Cervical back: Normal range of motion and neck supple.     Right lower leg: Edema (dramatic improved edema) present.     Left lower leg: Edema present.  Lymphadenopathy:     Cervical: No cervical adenopathy.  Skin:    General: Skin is warm and dry.     Findings: No erythema or rash.     Comments: Sutures x 4 left eyebrow  Neurological:     Mental Status: He is alert and oriented to person, place, and time. Mental status is at baseline.  Psychiatric:        Behavior: Behavior normal.     Comments: Well groomed, good eye contact, normal speech and thoughts   ________________________________________________________ PROCEDURE NOTE Date - 12/23/21 Suture Removal - Left eyebrow location - [facility] # of Sutures: 4 Discussed benefits and risks (including pain, bleeding, infection, wound separation). Verbal consent given by patient Medication:  None  Time Out taken Examination of the sutured laceration on L eyebrow appears to be well healed. Area cleansed with alcohol wipes. Appropriate # of sutures counted and confirmed. Removal of sutures one by one using sterile pick ups to adjust and  sterile scissors to cut one side of suture away from knot. Removal was uncomplicated. - All sutures were successfully removed and wound remains intact - 4 out of 4 sutures were successfully removed, remaining sutures were left in place due to concern about integrity of the wound and recommend further healing before removal. See A&P.     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  Alcoholic Cirrhosis, w edema ascites New diagnosis, HFU reviewed documentation GI Dr Servando Snare established No prior imaging documenting this, discussed relation to his edema that has been worked up, and explains ascites and edema as well as some coagulopathy Improved edema on diuretic regimen per GI, will refill if need, advise to work w/ GI on dosage  AFib persistent New diagnosis, not present on last several EKG reviewed 2018-2022 Upcoming Cardiology EP On BB metoprolol and Xarelto anticoag  Lab CMET next week.    No orders of the defined types were placed in this encounter.   Follow up plan: Return if symptoms worsen or fail to improve.   Saralyn Pilar, DO [facility] 12/23/2021, 3:04 PM

## 2021-12-23 NOTE — Patient Instructions (Addendum)
Lab next week non fasting.  Please schedule a Follow-up Appointment to: Return if symptoms worsen or fail to improve.  If you have any other questions or concerns, please feel free to call the office or send a message through [place]. You may also schedule an earlier appointment if necessary.  Additionally, you may be receiving a survey about your experience at our office within a few days to 1 week by e-mail or mail. We value your feedback.  Nobie Putnam, DO [place]

## 2021-12-28 ENCOUNTER — Other Ambulatory Visit: Payer: Medicare PPO

## 2021-12-28 DIAGNOSIS — K7031 Alcoholic cirrhosis of liver with ascites: Secondary | ICD-10-CM

## 2021-12-29 LAB — COMPLETE METABOLIC PANEL WITH GFR
AG Ratio: 1.1 (calc) (ref 1.0–2.5)
ALT: 8 U/L — ABNORMAL LOW (ref 9–46)
AST: 19 U/L (ref 10–35)
Albumin: 3.5 g/dL — ABNORMAL LOW (ref 3.6–5.1)
Alkaline phosphatase (APISO): 70 U/L (ref 35–144)
BUN: 12 mg/dL (ref 7–25)
CO2: 33 mmol/L — ABNORMAL HIGH (ref 20–32)
Calcium: 9.6 mg/dL (ref 8.6–10.3)
Chloride: 100 mmol/L (ref 98–110)
Creat: 0.72 mg/dL (ref 0.70–1.22)
Globulin: 3.1 g/dL (calc) (ref 1.9–3.7)
Glucose, Bld: 103 mg/dL — ABNORMAL HIGH (ref 65–99)
Potassium: 4 mmol/L (ref 3.5–5.3)
Sodium: 141 mmol/L (ref 135–146)
Total Bilirubin: 1 mg/dL (ref 0.2–1.2)
Total Protein: 6.6 g/dL (ref 6.1–8.1)
eGFR: 92 mL/min/{1.73_m2} (ref >=60)

## 2022-01-02 ENCOUNTER — Encounter: Payer: Self-pay | Admitting: Gastroenterology

## 2022-01-03 LAB — MISC LABCORP TEST (SEND OUT): Labcorp test code: 5367

## 2022-01-24 NOTE — Progress Notes (Unsigned)
Electrophysiology Office Note:    Date:  01/25/2022   ID:  Craig [name], DOB [birth year], MRN [id number]  PCP:  Olin Hauser, DO  [facility] Cardiologist:  None  [facility] Electrophysiologist:  Vickie Epley, MD   Referring MD: Gwynne Edinger, MD   Chief Complaint: AF  History of Present Illness:    Craig [name] is a 81 y.o. male who presents for an evaluation of atrial fibrillation at the request of Dr Si [name]. Their medical history includes alcoholic cirrhosis complicated by ascites, atrial fibrillation, recurrent VTE.  He is completely asymptomatic with his atrial fibrillation.  He takes Xarelto for stroke prophylaxis without any bleeding issues.     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   cholecalciferol (VITAMIN D) 1000 UNITS tablet Take 1,000 Units by mouth daily.   donepezil (ARICEPT) 5 MG tablet Take 1 tablet (5 mg total) by mouth at bedtime.   escitalopram (LEXAPRO) 20 MG tablet TAKE 1 TABLET EVERY DAY   fluticasone (FLONASE) 50 MCG/ACT nasal spray Place 2 sprays into both nostrils daily. Use for 4-6 weeks then stop and use seasonally or as needed.   furosemide (LASIX) 40 MG tablet Take 1 tablet (40 mg total) by mouth daily as needed for fluid or edema.   Melatonin 5 MG TABS Take 10 mg by mouth at bedtime. 3 tabs   metoprolol succinate (TOPROL-XL) 25 MG 24 hr tablet TAKE 1 TABLET (25  MG TOTAL) BY MOUTH DAILY.   omeprazole (PRILOSEC) 40 MG capsule TAKE 1 CAPSULE EVERY DAY   spironolactone (ALDACTONE) 100 MG tablet Take 1 tablet (100 mg total) by mouth daily.   vitamin B-12 (CYANOCOBALAMIN) 500 MCG tablet Take 500 mcg by mouth daily.   vitamin E 1000 UNIT capsule Take 1,000 Units by mouth daily.   XARELTO 20 MG TABS tablet TAKE 1 TABLET (20 MG TOTAL) BY MOUTH DAILY WITH SUPPER.     Allergies:   Trazodone and nefazodone   Social History   Socioeconomic History   Marital status: Single    Spouse name: Not on file   Number of children: Not on file   Years of education: Not on file   Highest education level: Not on file  Occupational History   Occupation: Retired Physiological scientist)  Tobacco Use   Smoking status: Former    Years: 20.00    Types: Cigarettes    Quit date: 07/24/1978    Years since quitting: 43.5   Smokeless tobacco: Former   Tobacco comments:    Quit tobacco in [place] Use: Never used  Substance and Sexual Activity   Alcohol use: Yes    Alcohol/week: 3.0 standard drinks of alcohol    Types: 3 Shots of liquor per week    Comment: daily   Drug use: No   Sexual activity:  Yes    Birth control/protection: None  Other Topics Concern   Not on file  Social History Narrative   Retired from [employer]. Retired x 20 years.  Two children.     Social Determinants of Health   Financial Resource Strain: Low Risk  (12/08/2021)   Overall Financial Resource Strain (CARDIA)    Difficulty of Paying Living Expenses: Not hard at all  Food Insecurity: No Food Insecurity (12/08/2021)   Hunger Vital Sign    Worried About Running Out of Food in the Last Year: Never true    Ran Out of Food in the Last Year: Never true  Transportation Needs: No Transportation Needs (12/08/2021)   PRAPARE - Administrator, Civil Service (Medical): No    Lack of Transportation (Non-Medical): No  Physical Activity: Inactive  (12/08/2021)   Exercise Vital Sign    Days of Exercise per Week: 0 days    Minutes of Exercise per Session: 0 min  Stress: Stress Concern Present (12/08/2021)   [employer] of Occupational Health - Occupational Stress Questionnaire    Feeling of Stress : Very much  Social Connections: Socially Integrated (12/08/2021)   Social Connection and Isolation Panel [NHANES]    Frequency of Communication with Friends and Family: Twice a week    Frequency of Social Gatherings with Friends and Family: Once a week    Attends Religious Services: 1 to 4 times per year    Active Member of [employer] or Organizations: No    Attends Engineer, structural: 1 to 4 times per year    Marital Status: Married     Family History: The patient's family history includes CAD in his brother; CAD (age of onset: 46) in his father; Kidney failure in his mother.  ROS:   Please see the history of present illness.    All other systems reviewed and are negative.  EKGs/Labs/Other Studies Reviewed:    The following studies were reviewed today:  Dec 16, 2021 echo LV function normal, 60% RV function normal Severely dilated left atrium  EKG from Dec 15, 2021 personally reviewed shows atrial fibrillation with a right bundle branch block  April 06, 2021 EKG shows sinus rhythm with a right bundle branch block     Recent Labs: 12/15/2021: B Natriuretic Peptide 363.3 12/18/2021: Hemoglobin 11.5; Magnesium 1.7; Platelets 191; TSH 1.292 12/28/2021: ALT 8; BUN 12; Creat 0.72; Potassium 4.0; Sodium 141  Recent Lipid Panel    Component Value Date/Time   CHOL 126 08/10/2021 1132   CHOL 181 06/24/2019 1311   TRIG 146 08/10/2021 1132   HDL 68 08/10/2021 1132   HDL 109 06/24/2019 1311   CHOLHDL 1.9 08/10/2021 1132   VLDL 19 05/11/2016 1102   LDLCALC 35 08/10/2021 1132    Physical Exam:    VS:  BP 110/64 (BP Location: Right Arm, Patient Position: Sitting, Cuff Size: Normal)   Pulse 61   Ht 6\' 4"  (1.93 m)    Wt 194 lb (88 kg)   SpO2 98%   BMI 23.61 kg/m     Wt Readings from Last 3 Encounters:  01/25/22 194 lb (88 kg)  12/23/21 205 lb 12.8 oz (93.4 kg)  12/22/21 209 lb (94.8 kg)     GEN:  chronically ill appearing, no distress HEENT: Normal NECK: No JVD; No carotid bruits LYMPHATICS: No lymphadenopathy CARDIAC: Irregularly irregular, no murmurs, rubs, gallops RESPIRATORY:  Clear to auscultation without rales, wheezing or rhonchi  ABDOMEN: Soft, non-tender, non-distended  MUSCULOSKELETAL:  No edema; No deformity  SKIN: Warm and dry NEUROLOGIC:  Alert and oriented x 3 PSYCHIATRIC:  Normal affect       ASSESSMENT:    1. Persistent atrial fibrillation (HCC)   2. Recurrent pulmonary emboli (HCC)   3. Essential hypertension   4. Alcoholic cirrhosis of liver with ascites (HCC)    PLAN:    In order of problems listed above:  #Persistent atrial fibrillation Asymptomatic.  Normal left ventricular function.  Has cirrhosis complicated by ascites.  At this time, would favor a rate control strategy.  Recommend continuing the metoprolol and Xarelto.  Not a candidate for invasive EP procedures.  #Hypertension Controlled Continue current medical therapy  #Alcoholic cirrhosis complicated by ascites Being evaluated by GI.    Medication Adjustments/Labs and Tests Ordered: Current medicines are reviewed at length with the patient today.  Concerns regarding medicines are outlined above.  No orders of the defined types were placed in this encounter.  No orders of the defined types were placed in this encounter.    Signed, Rossie Muskrat. Lalla Brothers, MD, [facility], [facility] 01/25/2022 11:59 AM    Electrophysiology [facility]

## 2022-01-25 ENCOUNTER — Ambulatory Visit: Payer: Medicare PPO | Admitting: Cardiology

## 2022-01-25 ENCOUNTER — Encounter: Payer: Self-pay | Admitting: Cardiology

## 2022-01-25 ENCOUNTER — Encounter: Payer: Self-pay | Admitting: Gastroenterology

## 2022-01-25 VITALS — BP 110/64 | HR 61 | Ht 76.0 in | Wt 194.0 lb

## 2022-01-25 DIAGNOSIS — I2699 Other pulmonary embolism without acute cor pulmonale: Secondary | ICD-10-CM

## 2022-01-25 DIAGNOSIS — I10 Essential (primary) hypertension: Secondary | ICD-10-CM

## 2022-01-25 DIAGNOSIS — I4819 Other persistent atrial fibrillation: Secondary | ICD-10-CM | POA: Diagnosis not present

## 2022-01-25 DIAGNOSIS — K7031 Alcoholic cirrhosis of liver with ascites: Secondary | ICD-10-CM | POA: Diagnosis not present

## 2022-01-25 NOTE — Patient Instructions (Signed)
Medication Instructions:  No changes at this time.   *If you need a refill on your cardiac medications before your next appointment, please call your pharmacy*   Lab Work: None  If you have labs (blood work) drawn today and your tests are completely normal, you will receive your results only by: MyChart Message (if you have MyChart) OR A paper copy in the mail If you have any lab test that is abnormal or we need to change your treatment, we will call you to review the results.   Testing/Procedures: None   Follow-Up: At [facility], you and your health needs are our priority.  As part of our continuing mission to provide you with exceptional heart care, we have created designated Provider Care Teams.  These Care Teams include your primary Cardiologist (physician) and Advanced Practice Providers (APPs -  Physician Assistants and Nurse Practitioners) who all work together to provide you with the care you need, when you need it.   Your next appointment:   Follow up as needed.     Important Information About Sugar       

## 2022-01-26 ENCOUNTER — Telehealth: Payer: Self-pay

## 2022-01-26 NOTE — Telephone Encounter (Signed)
Per Dr Althea Charon  it would be hold for 48 hours prior to procedure, restart 24 hours after procedure. I will be out of office this afternoon, and if you re-fax it I can complete it and have it ready to be returned to you Friday 7/7 morning.  Completed it and it should be faxed promptly.    Pt is aware as instructed

## 2022-01-28 ENCOUNTER — Other Ambulatory Visit: Payer: Self-pay | Admitting: Family Medicine

## 2022-01-28 DIAGNOSIS — I10 Essential (primary) hypertension: Secondary | ICD-10-CM

## 2022-01-30 ENCOUNTER — Encounter: Payer: Self-pay | Admitting: Nurse Practitioner

## 2022-01-30 ENCOUNTER — Telehealth: Payer: Self-pay | Admitting: *Deleted

## 2022-01-30 ENCOUNTER — Ambulatory Visit (INDEPENDENT_AMBULATORY_CARE_PROVIDER_SITE_OTHER): Payer: Medicare PPO | Admitting: Nurse Practitioner

## 2022-01-30 DIAGNOSIS — I700 Atherosclerosis of aorta: Secondary | ICD-10-CM

## 2022-01-30 DIAGNOSIS — Z01810 Encounter for preprocedural cardiovascular examination: Secondary | ICD-10-CM | POA: Diagnosis not present

## 2022-01-30 NOTE — Telephone Encounter (Signed)
   Pre-operative Risk Assessment    Patient Name: Craig [name]  DOB: Jul 29, 1940 MRN: [id number]     REQUESTING OFFICE FAXED CLEARANCE REQUEST TO OUR OFFICE 01/27/22 @ 6:50 PM (WHICH AT THAT TIME OUR OFFICE WAS CLOSED) CLEARANCE WAS RECEIVED THIS MORNING 01/30/22 Request for Surgical Clearance    Procedure:   EGD  Date of Surgery:  Clearance 01/31/22  URGENT PER CLEARANCE REQUEST                             Surgeon: DR. DARREN WOHL Surgeon's Group or Practice Name:  [facility] Phone number:  [phone] Fax number:  [phone] ATTN: MELANIE   Type of Clearance Requested:   - Medical  - Pharmacy:  Hold Rivaroxaban (Xarelto)     Type of Anesthesia:  General    Additional requests/questions:    Elpidio Anis   01/30/2022, 10:13 AM

## 2022-01-30 NOTE — Telephone Encounter (Signed)
Patient with diagnosis of afib and recurrent/chronic PE/DVT on Xarelto for anticoagulation.    Procedure: EGD Date of procedure: tomorrow  CHA2DS2-VASc Score = 6  This indicates a 9.7% annual risk of stroke. The patient's score is based upon: CHF History: 0 HTN History: 1 Diabetes History: 0 Stroke History: 2 Vascular Disease History: 1 Age Score: 2 Gender Score: 0   CT of abdomen and pelvis on 12/15/21 showed chronic PE in right lower love (initially dx with PE and DVT 07/2014, repeat PE identified within 6 months despite anticoag, now with chronic PE).  CrCl 189mL/min Platelet count 191K  Per office protocol, patient can hold Xarelto for 1 day prior to procedure tomorrow. Please resume as soon as safely possible given pt's chronic PE and hx of afib with prior stroke.    **This guidance is not considered finalized until pre-operative APP has relayed final recommendations.**

## 2022-01-30 NOTE — Telephone Encounter (Signed)
Pt agreeable to plan of care for tele appt today at 1:20. Pt procedure 01/31/22. Med rec and consent are done.     Patient Consent for Virtual Visit        Craig [name] has provided verbal consent on 01/30/2022 for a virtual visit (video or telephone).   CONSENT FOR VIRTUAL VISIT FOR:  Craig [name]  By participating in this virtual visit I agree to the following:  I hereby voluntarily request, consent and authorize [facility] and its employed or contracted physicians, physician assistants, nurse practitioners or other licensed health care professionals (the Practitioner), to provide me with telemedicine health care services (the "Services") as deemed necessary by the treating Practitioner. I acknowledge and consent to receive the Services by the Practitioner via telemedicine. I understand that the telemedicine visit will involve communicating with the Practitioner through live audiovisual communication technology and the disclosure of certain medical information by electronic transmission. I acknowledge that I have been given the opportunity to request an in-person assessment or other available alternative prior to the telemedicine visit and am voluntarily participating in the telemedicine visit.  I understand that I have the right to withhold or withdraw my consent to the use of telemedicine in the course of my care at any time, without affecting my right to future care or treatment, and that the Practitioner or I may terminate the telemedicine visit at any time. I understand that I have the right to inspect all information obtained and/or recorded in the course of the telemedicine visit and may receive copies of available information for a reasonable fee.  I understand that some of the potential risks of receiving the Services via telemedicine include:  Delay or interruption in medical evaluation due to technological equipment failure or disruption; Information transmitted may not be  sufficient (e.g. poor resolution of images) to allow for appropriate medical decision making by the Practitioner; and/or  In rare instances, security protocols could fail, causing a breach of personal health information.  Furthermore, I acknowledge that it is my responsibility to provide information about my medical history, conditions and care that is complete and accurate to the best of my ability. I acknowledge that Practitioner's advice, recommendations, and/or decision may be based on factors not within their control, such as incomplete or inaccurate data provided by me or distortions of diagnostic images or specimens that may result from electronic transmissions. I understand that the practice of medicine is not an exact science and that Practitioner makes no warranties or guarantees regarding treatment outcomes. I acknowledge that a copy of this consent can be made available to me via my patient portal [facility] MyChart), or I can request a printed copy by calling the office of [facility].    I understand that my insurance will be billed for this visit.   I have read or had this consent read to me. I understand the contents of this consent, which adequately explains the benefits and risks of the Services being provided via telemedicine.  I have been provided ample opportunity to ask questions regarding this consent and the Services and have had my questions answered to my satisfaction. I give my informed consent for the services to be provided through the use of telemedicine in my medical care

## 2022-01-30 NOTE — Telephone Encounter (Signed)
Requested medication (s) are due for refill today: Yes  Requested medication (s) are on the active medication list: Yes  Last refill:  01/26/21  Future visit scheduled: No  Notes to clinic:  Prescription has expired.    Requested Prescriptions  Pending Prescriptions Disp Refills   metoprolol succinate (TOPROL-XL) 25 MG 24 hr tablet [Pharmacy Med Name: METOPROLOL SUCCINATE ER 25 MG Tablet Extended Release 24 Hour] 90 tablet 3    Sig: TAKE 1 TABLET (25 MG TOTAL) BY MOUTH DAILY.     Cardiovascular:  Beta Blockers Passed - 01/28/2022  2:59 AM      Passed - Last BP in normal range    BP Readings from Last 1 Encounters:  01/25/22 110/64         Passed - Last Heart Rate in normal range    Pulse Readings from Last 1 Encounters:  01/25/22 61         Passed - Valid encounter within last 6 months    Recent Outpatient Visits           1 month ago Alcoholic cirrhosis of liver with ascites [facility])   [facility] Smitty Cords, DO   1 month ago Lymphedema of both lower extremities   [facility] [place], Netta Neat, DO   5 months ago Annual physical exam   [facility] Smitty Cords, DO   8 months ago Generalized weakness   [facility] [place], Netta Neat, DO   1 year ago Lymphedema of both lower extremities   [facility] [place], Netta Neat, [place]

## 2022-01-30 NOTE — Progress Notes (Signed)
Virtual Visit via Telephone Note   Because of Craig [name]'s co-morbid illnesses, he is at least at moderate risk for complications without adequate follow up.  This format is felt to be most appropriate for this patient at this time.  The patient did not have access to video technology/had technical difficulties with video requiring transitioning to audio format only (telephone).  All issues noted in this document were discussed and addressed.  No physical exam could be performed with this format.  Please refer to the patient's chart for his consent to telehealth for [facility].  Evaluation Performed:  Preoperative cardiovascular risk assessment _____________   Date:  01/30/2022   Patient ID:  Craig [name], DOB 11-Jan-1941, MRN [id number] Patient Location:  Home Provider location:   Office  Primary Care Provider:  Smitty Cords, DO Primary Cardiologist:  None  Chief Complaint / Patient Profile   81 y.o. y/o male with a h/o hypertension, persistent atrial fibrillation on chronic anticoagulation, history of DVT (unprovoked x 2), PE, alcoholic cirrhosis complicated by ascites, hyperlipidemia,  who is pending EGD and presents today for telephonic preoperative cardiovascular risk assessment.  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 male who presents via audio/video  conferencing for a telehealth visit today.  Pt was last seen in cardiology clinic on 01/25/22 by Dr. Lalla Brothers.  At that time Craig [name] was doing well.  The patient is now pending procedure as outlined above. Since his last visit, he  denies chest pain, shortness of breath, lower extremity edema, fatigue, palpitations, melena, hematuria, hemoptysis, diaphoresis, weakness, presyncope, syncope, orthopnea, and PND.   Home Medications    Prior to Admission medications   Medication Sig Start Date End Date Taking? Authorizing Provider  atorvastatin (LIPITOR) 40 MG tablet TAKE 1 TABLET (40 MG TOTAL) BY MOUTH DAILY. 02/10/21   Karamalegos, Netta Neat, DO  cholecalciferol (VITAMIN D) 1000 UNITS tablet Take 1,000 Units by mouth daily.    [provider]  donepezil (ARICEPT) 5 MG tablet Take 1 tablet (5 mg total) by mouth at bedtime. 12/08/21   Karamalegos, Netta Neat, DO  escitalopram (LEXAPRO) 20 MG tablet TAKE 1 TABLET EVERY DAY 09/20/21   Karamalegos, Netta Neat, DO  fluticasone (FLONASE) 50 MCG/ACT nasal spray Place 2 sprays into both nostrils daily. Use for 4-6 weeks then stop and use seasonally or as needed. 12/08/21   Karamalegos, Netta Neat, DO  furosemide (LASIX) 40 MG tablet Take 1 tablet (40 mg total) by mouth daily as needed for fluid or edema. 12/18/21   Wouk, Wilfred Curtis, MD  Melatonin 5 MG TABS Take 10 mg by mouth at bedtime. 3 tabs    [provider]  metoprolol succinate (TOPROL-XL) 25 MG 24 hr tablet TAKE 1 TABLET (25 MG TOTAL) BY MOUTH DAILY. 01/26/21  Karamalegos, Netta Neat, DO  omeprazole (PRILOSEC) 40 MG capsule TAKE 1 CAPSULE EVERY DAY 08/27/21   Karamalegos, Netta Neat, DO  spironolactone (ALDACTONE) 100 MG tablet Take 1 tablet (100 mg total) by mouth daily. 12/19/21   Wouk, Wilfred Curtis, MD  vitamin B-12 (CYANOCOBALAMIN) 500 MCG tablet Take 500 mcg by mouth daily.    [provider]  vitamin E 1000 UNIT capsule Take 1,000 Units by mouth daily.    [provider]  XARELTO 20 MG TABS tablet TAKE 1 TABLET (20 MG TOTAL) BY MOUTH DAILY WITH SUPPER. 02/10/21   Smitty Cords, DO    Physical Exam    Vital Signs:  Craig [name] does not have vital signs available for review today.  Given telephonic nature of communication, physical exam is limited. AAOx3. NAD. Normal affect.  Speech and respirations are unlabored.  Accessory Clinical Findings    None  Assessment & Plan    1.  Preoperative Cardiovascular Risk Assessment: Patient is doing well from a cardiac perspective and may proceed to surgery without further testing. According to the Revised Cardiac Risk Index (RCRI), his Perioperative Risk of Major Cardiac Event is (%): 0.4 His Functional Capacity in METs is: 7.04 according to the Duke Activity Status Index (DASI). Per office protocol, patient can hold Xarelto for 1 day prior to procedure tomorrow. Please resume as soon as safely possible given pt's chronic PE and hx of afib with prior stroke.    A copy of this note will be routed to requesting surgeon.  Time:   Today, I have spent 10 minutes with the patient with telehealth technology discussing medical history, symptoms, and management plan.     Levi Aland, NP-C    01/30/2022, 1:20 PM [facility] [address], [address] Office [phone] Fax [phone]

## 2022-01-30 NOTE — Telephone Encounter (Signed)
Pt agreeable to plan of care for tele appt today at 1:20. Pt procedure 01/31/22. Med rec and consent are done.

## 2022-01-31 ENCOUNTER — Other Ambulatory Visit: Payer: Self-pay

## 2022-01-31 ENCOUNTER — Encounter
Admission: RE | Disposition: A | Discharge status: Home / Self Care | Payer: Self-pay | Source: Home / Self Care | Attending: Gastroenterology

## 2022-01-31 ENCOUNTER — Ambulatory Visit: Payer: Medicare PPO | Admitting: Certified Registered"

## 2022-01-31 ENCOUNTER — Encounter: Payer: Self-pay | Admitting: Gastroenterology

## 2022-01-31 ENCOUNTER — Ambulatory Visit
Admission: RE | Admit: 2022-01-31 | Discharge: 2022-01-31 | Disposition: A | Discharge status: Home / Self Care | Payer: Medicare PPO | Attending: Gastroenterology | Admitting: Gastroenterology

## 2022-01-31 DIAGNOSIS — K7460 Unspecified cirrhosis of liver: Secondary | ICD-10-CM | POA: Diagnosis not present

## 2022-01-31 DIAGNOSIS — Z8249 Family history of ischemic heart disease and other diseases of the circulatory system: Secondary | ICD-10-CM | POA: Diagnosis not present

## 2022-01-31 DIAGNOSIS — Z87891 Personal history of nicotine dependence: Secondary | ICD-10-CM | POA: Insufficient documentation

## 2022-01-31 DIAGNOSIS — K7031 Alcoholic cirrhosis of liver with ascites: Secondary | ICD-10-CM | POA: Diagnosis not present

## 2022-01-31 DIAGNOSIS — I8510 Secondary esophageal varices without bleeding: Secondary | ICD-10-CM | POA: Insufficient documentation

## 2022-01-31 DIAGNOSIS — I8500 Esophageal varices without bleeding: Secondary | ICD-10-CM | POA: Diagnosis not present

## 2022-01-31 DIAGNOSIS — Z8673 Personal history of transient ischemic attack (TIA), and cerebral infarction without residual deficits: Secondary | ICD-10-CM | POA: Insufficient documentation

## 2022-01-31 DIAGNOSIS — I10 Essential (primary) hypertension: Secondary | ICD-10-CM | POA: Insufficient documentation

## 2022-01-31 DIAGNOSIS — F419 Anxiety disorder, unspecified: Secondary | ICD-10-CM | POA: Diagnosis not present

## 2022-01-31 DIAGNOSIS — I639 Cerebral infarction, unspecified: Secondary | ICD-10-CM | POA: Diagnosis not present

## 2022-01-31 DIAGNOSIS — Z86711 Personal history of pulmonary embolism: Secondary | ICD-10-CM | POA: Diagnosis not present

## 2022-01-31 HISTORY — PX: ESOPHAGOGASTRODUODENOSCOPY (EGD) WITH PROPOFOL: SHX5813

## 2022-01-31 SURGERY — ESOPHAGOGASTRODUODENOSCOPY (EGD) WITH PROPOFOL
Anesthesia: General

## 2022-01-31 MED ORDER — GLYCOPYRROLATE 0.2 MG/ML IJ SOLN
INTRAMUSCULAR | Status: DC | PRN
  Administered 2022-01-31: .2 mg via INTRAVENOUS

## 2022-01-31 MED ORDER — DEXMEDETOMIDINE HCL IN NACL 200 MCG/50ML IV SOLN
INTRAVENOUS | Status: DC | PRN
  Administered 2022-01-31: 8 ug via INTRAVENOUS

## 2022-01-31 MED ORDER — LIDOCAINE HCL (CARDIAC) PF 100 MG/5ML IV SOSY
PREFILLED_SYRINGE | INTRAVENOUS | Status: DC | PRN
  Administered 2022-01-31: 100 mg via INTRAVENOUS

## 2022-01-31 MED ORDER — SODIUM CHLORIDE 0.9 % IV SOLN: INTRAVENOUS | Status: DC

## 2022-01-31 MED ORDER — PROPOFOL 500 MG/50ML IV EMUL
INTRAVENOUS | Status: DC | PRN
  Administered 2022-01-31: 155 ug/kg/min via INTRAVENOUS

## 2022-01-31 MED ORDER — PROPOFOL 10 MG/ML IV BOLUS
INTRAVENOUS | Status: DC | PRN
  Administered 2022-01-31: 20 mg via INTRAVENOUS
  Administered 2022-01-31: 60 mg via INTRAVENOUS
  Administered 2022-01-31: 20 mg via INTRAVENOUS

## 2022-01-31 NOTE — Anesthesia Postprocedure Evaluation (Signed)
Anesthesia Post Note  Patient: Craig [name]  Procedure(s) Performed: ESOPHAGOGASTRODUODENOSCOPY (EGD) WITH PROPOFOL  Patient location during evaluation: Endoscopy Anesthesia Type: General Level of consciousness: awake and alert Pain management: pain level controlled Vital Signs Assessment: post-procedure vital signs reviewed and stable Respiratory status: spontaneous breathing, nonlabored ventilation, respiratory function stable and patient connected to nasal cannula oxygen Cardiovascular status: blood pressure returned to baseline and stable Postop Assessment: no apparent nausea or vomiting Anesthetic complications: no   No notable events documented.   Last Vitals:  Vitals:   01/31/22 1035 01/31/22 1045  BP: 104/74 105/76  Pulse: (!) 54 67  Resp: 20 17  Temp:    SpO2: 95% 95%    Last Pain:  Vitals:   01/31/22 1045  TempSrc:   PainSc: 0-No pain                 Cleda Mccreedy Inetta Dicke

## 2022-01-31 NOTE — Transfer of Care (Signed)
Immediate Anesthesia Transfer of Care Note  Patient: Craig [name]  Procedure(s) Performed: ESOPHAGOGASTRODUODENOSCOPY (EGD) WITH PROPOFOL  Patient Location: Endoscopy Unit  Anesthesia Type:General  Level of Consciousness: awake, drowsy and patient cooperative  Airway & Oxygen Therapy: Patient Spontanous Breathing and Patient connected to face mask oxygen  Post-op Assessment: Report given to RN and Post -op Vital signs reviewed and stable  Post vital signs: Reviewed and stable  Last Vitals:  Vitals Value Taken Time  BP 108/77 01/31/22 1015  Temp    Pulse 61 01/31/22 1021  Resp 14 01/31/22 1021  SpO2 100 % 01/31/22 1021  Vitals shown include unvalidated device data.  Last Pain:  Vitals:   01/31/22 1015  TempSrc:   PainSc: Asleep         Complications: No notable events documented.

## 2022-01-31 NOTE — Op Note (Signed)
[facility] Patient Name: Craig [name] Procedure Date: 01/31/2022 10:06 AM MRN: [id number] Account #: [id number] Date of Birth: [birth year] Admit Type: Outpatient Age: 81 Room: [facility] ENDO ROOM 4 Gender: Male Note Status: Finalized Instrument Name: Upper Endoscope [phone] Procedure:             Upper GI endoscopy Indications:           Cirrhosis rule out esophageal varices Providers:             Midge Minium MD, MD Referring MD:          Smitty Cords (Referring MD) Medicines:             Propofol per Anesthesia Complications:         No immediate complications. Procedure:             Pre-Anesthesia Assessment:                        - Prior to the procedure, a History and Physical was                         performed, and patient medications and allergies were                         reviewed. The patient's tolerance of previous                         anesthesia was also reviewed. The risks and benefits                         of the procedure and the sedation options and risks                         were discussed with the patient. All questions were                         answered, and informed consent was obtained. Prior                         Anticoagulants: The patient has taken no previous                         anticoagulant or antiplatelet agents. ASA Grade                         Assessment: II - A patient with mild systemic disease.                         After reviewing the risks and benefits, the patient                         was deemed in satisfactory condition to undergo the                         procedure.                        After obtaining informed consent, the endoscope was  passed under direct vision. Throughout the procedure,                         the patient's blood pressure, pulse, and oxygen                         saturations were monitored continuously. The Endoscope                          was introduced through the mouth, and advanced to the                         second part of duodenum. The upper GI endoscopy was                         accomplished without difficulty. The patient tolerated                         the procedure well. Findings:      Grade I varices were found in the lower third of the esophagus.      The stomach was normal.      The examined duodenum was normal. Impression:            - Grade I esophageal varices.                        - Normal stomach.                        - Normal examined duodenum.                        - No specimens collected. Recommendation:        - Discharge patient to home.                        - Resume previous diet.                        - Continue present medications.                        - Repeat upper endoscopy in 2 years for surveillance.                        - Return to GI office in 5 months. Procedure Code(s):     --- Professional ---                        [phone], Esophagogastroduodenoscopy, flexible,                         transoral; diagnostic, including collection of                         specimen(s) by brushing or washing, when performed                         (separate procedure) Diagnosis Code(s):     --- Professional ---  I85.10, Secondary esophageal varices without bleeding CPT copyright 2019 American Medical Association. All rights reserved. The codes documented in this report are preliminary and upon coder review may  be revised to meet current compliance requirements. Midge Minium MD, MD 01/31/2022 10:16:59 AM This report has been signed electronically. Number of Addenda: 0 Note Initiated On: 01/31/2022 10:06 AM Estimated Blood Loss:  Estimated blood loss: none.      [facility]

## 2022-01-31 NOTE — Anesthesia Procedure Notes (Signed)
Procedure Name: General with mask airway Date/Time: 01/31/2022 10:20 AM  Performed by: Mohammed Kindle, CRNAPre-anesthesia Checklist: Patient identified, Emergency Drugs available, Suction available and Patient being monitored Patient Re-evaluated:Patient Re-evaluated prior to induction Oxygen Delivery Method: Simple face mask Induction Type: IV induction Placement Confirmation: positive ETCO2, CO2 detector and breath sounds checked- equal and bilateral Dental Injury: Teeth and Oropharynx as per pre-operative assessment

## 2022-01-31 NOTE — H&P (Signed)
Midge Minium, MD [facility] [address] [place], [place] [address] Phone:[phone] Fax : [phone]  Primary Care Physician:  Smitty Cords, DO Primary Gastroenterologist:  Dr. Servando Snare  Pre-Procedure History & Physical: HPI:  Craig [name] is a 81 y.o. male is here for an endoscopy.   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 Authorizing Provider  atorvastatin (LIPITOR) 40 MG tablet TAKE 1 TABLET (40 MG TOTAL) BY MOUTH DAILY. 02/10/21  Yes Karamalegos, Netta Neat, DO  cholecalciferol (VITAMIN D) 1000 UNITS tablet Take 1,000 Units by mouth daily.   Yes [provider]  donepezil (ARICEPT) 5 MG tablet Take 1 tablet (5 mg total) by mouth at bedtime. 12/08/21  Yes Karamalegos, Netta Neat, DO  escitalopram (LEXAPRO) 20 MG tablet TAKE 1 TABLET EVERY DAY 09/20/21  Yes Karamalegos, Netta Neat, DO  fluticasone (FLONASE) 50 MCG/ACT nasal spray Place 2 sprays into both nostrils daily. Use for 4-6 weeks then stop and use seasonally or as needed. 12/08/21  Yes Karamalegos, Netta Neat, DO  furosemide (LASIX) 40 MG tablet Take 1 tablet (40 mg total) by mouth daily as needed for fluid or edema. 12/18/21  Yes Wouk, Wilfred Curtis, MD  Melatonin 5 MG TABS Take 10 mg by mouth at bedtime. 3 tabs   Yes [provider]  metoprolol succinate (TOPROL-XL) 25 MG 24 hr tablet TAKE 1 TABLET (25 MG TOTAL) BY MOUTH DAILY. 01/30/22  Yes Karamalegos, Netta Neat, DO  omeprazole  (PRILOSEC) 40 MG capsule TAKE 1 CAPSULE EVERY DAY 08/27/21  Yes Karamalegos, Netta Neat, DO  spironolactone (ALDACTONE) 100 MG tablet Take 1 tablet (100 mg total) by mouth daily. 12/19/21  Yes Wouk, Wilfred Curtis, MD  vitamin B-12 (CYANOCOBALAMIN) 500 MCG tablet Take 500 mcg by mouth daily.   Yes [provider]  vitamin E 1000 UNIT capsule Take 1,000 Units by mouth daily.   Yes [provider]  XARELTO 20 MG TABS tablet TAKE 1 TABLET (20 MG TOTAL) BY MOUTH DAILY WITH SUPPER. 02/10/21   Althea Charon, Netta Neat, DO    Allergies as of 12/23/2021 - Review Complete 12/23/2021  Allergen Reaction Noted   Trazodone and nefazodone Itching and Swelling 05/08/2017    Family History  Problem Relation Age of Onset   CAD Father 16   Kidney failure Mother    CAD Brother     Social History   Socioeconomic History   Marital status: Single    Spouse name: Not on file   Number of children: Not on file   Years of education: Not on file   Highest education level: Not on file  Occupational History   Occupation: Retired Water quality scientist)  Tobacco Use   Smoking status: Former    Years: 20.00    Types: Cigarettes    Quit date: 07/24/1978    Years since quitting: 43.5   Smokeless tobacco: Former   Tobacco comments:    Quit  tobacco in 1980  Vaping Use   Vaping Use: Never used  Substance and Sexual Activity   Alcohol use: Yes    Alcohol/week: 3.0 standard drinks of alcohol    Types: 3 Shots of liquor per week    Comment: daily   Drug use: No   Sexual activity: Yes    Birth control/protection: None  Other Topics Concern   Not on file  Social History Narrative   Retired from [employer]. Retired x 20 years.  Two children.     Social Determinants of Health   Financial Resource Strain: Low Risk  (12/08/2021)   Overall Financial Resource Strain (CARDIA)    Difficulty of Paying Living Expenses: Not hard at all  Food Insecurity: No Food Insecurity (12/08/2021)    Hunger Vital Sign    Worried About Running Out of Food in the Last Year: Never true    Ran Out of Food in the Last Year: Never true  Transportation Needs: No Transportation Needs (12/08/2021)   PRAPARE - Administrator, Civil Service (Medical): No    Lack of Transportation (Non-Medical): No  Physical Activity: Inactive (12/08/2021)   Exercise Vital Sign    Days of Exercise per Week: 0 days    Minutes of Exercise per Session: 0 min  Stress: Stress Concern Present (12/08/2021)   [employer] of Occupational Health - Occupational Stress Questionnaire    Feeling of Stress : Very much  Social Connections: Socially Integrated (12/08/2021)   Social Connection and Isolation Panel [NHANES]    Frequency of Communication with Friends and Family: Twice a week    Frequency of Social Gatherings with Friends and Family: Once a week    Attends Religious Services: 1 to 4 times per year    Active Member of [employer] or Organizations: No    Attends Engineer, structural: 1 to 4 times per year    Marital Status: Married  Catering manager Violence: Not on file    Review of Systems: See HPI, otherwise negative ROS  Physical Exam: BP 103/66   Pulse 63   Temp (!) 97.4 F (36.3 C) (Temporal)   Resp 16   Ht 6\' 4"  (1.93 m)   Wt 88 kg   SpO2 97%   BMI 23.62 kg/m  General:   Alert,  pleasant and cooperative in NAD Head:  Normocephalic and atraumatic. Neck:  Supple; no masses or thyromegaly. Lungs:  Clear throughout to auscultation.    Heart:  Regular rate and rhythm. Abdomen:  Soft, nontender and nondistended. Normal bowel sounds, without guarding, and without rebound.   Neurologic:  Alert and  oriented x4;  grossly normal neurologically.  Impression/Plan: Craig [name] is here for an endoscopy to be performed for cirrhosis  Risks, benefits, limitations, and alternatives regarding  endoscopy have been reviewed with the patient.  Questions have been answered.  All parties  agreeable.   [name], MD  01/31/2022, 10:04 AM

## 2022-01-31 NOTE — Anesthesia Preprocedure Evaluation (Signed)
Anesthesia Evaluation  Patient identified by MRN, date of birth, ID band Patient awake    Reviewed: Allergy & Precautions, NPO status , Patient's Chart, lab work & pertinent test results  History of Anesthesia Complications Negative for: history of anesthetic complications  Airway Mallampati: III  TM Distance: >3 FB Neck ROM: full    Dental  (+) Chipped, Poor Dentition   Pulmonary neg shortness of breath, former smoker,    Pulmonary exam normal        Cardiovascular Exercise Tolerance: Good hypertension, (-) anginaNormal cardiovascular exam     Neuro/Psych PSYCHIATRIC DISORDERS CVA, Residual Symptoms    GI/Hepatic negative GI ROS, Neg liver ROS,   Endo/Other  negative endocrine ROS  Renal/GU Renal disease  negative genitourinary   Musculoskeletal   Abdominal   Peds  Hematology negative hematology ROS (+)   Anesthesia Other Findings Past Medical History: No date: Anxiety No date: Essential hypertension No date: Pulmonary emboli (HCC) No date: Stroke [facility]) No date: 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(HCC)  Past Surgical History: 07/27/2014: LEFT HEART CATHETERIZATION WITH CORONARY ANGIOGRAM;  Bilateral     Comment:  Procedure: LEFT HEART CATHETERIZATION WITH CORONARY               ANGIOGRAM;  Surgeon: Runell Gess, MD;  Location: [facility]               CATH LAB;  Service: Cardiovascular;  Laterality:               Bilateral; No date: SPLENECTOMY, TOTAL     Comment:  TTP  BMI    Body Mass Index: 23.62 kg/m      Reproductive/Obstetrics negative OB ROS                             Anesthesia Physical Anesthesia Plan  ASA: 3  Anesthesia Plan: General   Post-op Pain Management:    Induction: Intravenous  PONV Risk Score and Plan: Propofol infusion and TIVA  Airway Management Planned: Natural Airway and Nasal Cannula  Additional Equipment:   Intra-op Plan:   Post-operative Plan:    Informed Consent: I have reviewed the patients History and Physical, chart, labs and discussed the procedure including the risks, benefits and alternatives for the proposed anesthesia with the patient or authorized representative who has indicated his/her understanding and acceptance.     Dental Advisory Given  Plan Discussed with: Anesthesiologist, CRNA and Surgeon  Anesthesia Plan Comments: (Patient consented for risks of anesthesia including but not limited to:  - adverse reactions to medications - risk of airway placement if required - damage to eyes, teeth, lips or other oral mucosa - nerve damage due to positioning  - sore throat or hoarseness - Damage to heart, brain, nerves, lungs, other parts of body or loss of life  Patient voiced understanding.)        Anesthesia Quick Evaluation

## 2022-02-01 ENCOUNTER — Encounter: Payer: Self-pay | Admitting: Gastroenterology

## 2022-02-11 ENCOUNTER — Other Ambulatory Visit: Payer: Self-pay | Admitting: Family Medicine

## 2022-02-11 DIAGNOSIS — E785 Hyperlipidemia, unspecified: Secondary | ICD-10-CM

## 2022-02-11 DIAGNOSIS — I2699 Other pulmonary embolism without acute cor pulmonale: Secondary | ICD-10-CM

## 2022-02-13 NOTE — Telephone Encounter (Signed)
Requested medication (s) are due for refill today - expired Rx  Requested medication (s) are on the active medication list yes  Future visit scheduled -yes  Last refill: Xarelto and Atorvastatin - 02/10/21 #90 3RF                 Amlodipine no longer on current medication list  Notes to clinic: see above  Requested Prescriptions  Pending Prescriptions Disp Refills   XARELTO 20 MG TABS tablet [Pharmacy Med Name: XARELTO 20 MG Tablet] 90 tablet 3    Sig: TAKE 1 TABLET (20 MG TOTAL) BY MOUTH DAILY WITH SUPPER.     Hematology: Anticoagulants - rivaroxaban Failed - 02/11/2022  8:35 AM      Failed - ALT in normal range and within 360 days    ALT  Date Value Ref Range Status  12/28/2021 8 (L) 9 - 46 U/L Final   SGPT (ALT)  Date Value Ref Range Status  07/25/2014 16 U/L Final    Comment:    14-63 NOTE: New Reference Range 02/10/14          Failed - HCT in normal range and within 360 days    HCT  Date Value Ref Range Status  12/18/2021 33.1 (L) 39.0 - 52.0 % Final   Hematocrit  Date Value Ref Range Status  06/24/2019 40.4 37.5 - 51.0 % Final         Failed - HGB in normal range and within 360 days    Hemoglobin  Date Value Ref Range Status  12/18/2021 11.5 (L) 13.0 - 17.0 g/dL Final  06/24/2019 14.3 13.0 - 17.7 g/dL Final         Passed - AST in normal range and within 360 days    AST  Date Value Ref Range Status  12/28/2021 19 10 - 35 U/L Final   SGOT(AST)  Date Value Ref Range Status  07/25/2014 29 15 - 37 Unit/L Final         Passed - Cr in normal range and within 360 days    Creat  Date Value Ref Range Status  12/28/2021 0.72 0.70 - 1.22 mg/dL Final         Passed - PLT in normal range and within 360 days    Platelets  Date Value Ref Range Status  12/18/2021 191 150 - 400 K/uL Final  06/24/2019 215 150 - 450 x10E3/uL Final         Passed - eGFR is 15 or above and within 360 days    GFR, Est African American  Date Value Ref Range Status  07/12/2020  97 > OR = 60 mL/min/1.42m Final   GFR, Est Non African American  Date Value Ref Range Status  07/12/2020 84 > OR = 60 mL/min/1.766mFinal   GFR, Estimated  Date Value Ref Range Status  12/18/2021 >60 >60 mL/min Final    Comment:    (NOTE) Calculated using the CKD-EPI Creatinine Equation (2021)    eGFR  Date Value Ref Range Status  12/28/2021 92 > OR = 60 mL/min/1.7354minal    Comment:    The eGFR is based on the CKD-EPI 2021 equation. To calculate  the new eGFR from a previous Creatinine or Cystatin C result, go to https://www.kidney.org/professionals/ kdoqi/gfr%5Fcalculator          Passed - Patient is not pregnant      Passed - Valid encounter within last 12 months    Recent Outpatient Visits  1 month ago Alcoholic cirrhosis of liver with ascites [facility])   [place], DO   2 months ago Lymphedema of both lower extremities   [place], DO   6 months ago Annual physical exam   [place], DO   9 months ago Generalized weakness   [place], DO   1 year ago Lymphedema of both lower extremities   [place], DO       Future Appointments             In 1 week [facility], [facility]             amLODipine (NORVASC) 5 MG tablet [Pharmacy Med Name: AMLODIPINE BESYLATE 5 MG Tablet] 90 tablet     Sig: TAKE 1 TABLET (5 MG TOTAL) BY MOUTH DAILY.     Cardiovascular: Calcium Channel Blockers 2 Passed - 02/11/2022  8:35 AM      Passed - Last BP in normal range    BP Readings from Last 1 Encounters:  01/31/22 105/76         Passed - Last Heart Rate in normal range    Pulse Readings from Last 1 Encounters:  01/31/22 67         Passed - Valid encounter within last 6 months    Recent Outpatient Visits            1 month ago Alcoholic cirrhosis of liver with ascites ([place])   [place], DO   2 months ago Lymphedema of both lower extremities   [place], DO   6 months ago Annual physical exam   [place], DO   9 months ago Generalized weakness   [place], DO   1 year ago Lymphedema of both lower extremities   [place], DO       Future Appointments             In 1 week Parks Ranger, Devonne Doughty, DO [facility], [facility]             atorvastatin (LIPITOR) 40 MG tablet [Pharmacy Med Name: ATORVASTATIN CALCIUM 40 MG Tablet] 90 tablet 3    Sig: TAKE 1 TABLET (40 MG TOTAL) BY MOUTH DAILY.     Cardiovascular:  Antilipid - Statins Failed - 02/11/2022  8:35 AM      Failed - Lipid Panel in normal range within the last 12 months    Cholesterol, Total  Date Value Ref Range Status  06/24/2019 181 100 - 199 mg/dL Final   Cholesterol  Date Value Ref Range Status  08/10/2021 126 <200 mg/dL Final   LDL Cholesterol (Calc)  Date Value Ref Range Status  08/10/2021 35 mg/dL (calc) Final    Comment:    Reference range: <100 . Desirable range <100 mg/dL for primary prevention;   <70 mg/dL for patients with CHD or diabetic patients  with > or = 2 CHD risk factors. Marland Kitchen LDL-C is now calculated using the Martin-Hopkins  calculation, which is a validated novel method providing  better accuracy than the Friedewald equation in the  estimation of LDL-C.  Cresenciano Genre et al. Annamaria Helling. 2992;426(83): 2061-2068  (http://education.[employer].com/faq/FAQ164)  HDL  Date Value Ref Range Status  08/10/2021 68 > OR = 40 mg/dL Final  06/24/2019 109 >39 mg/dL Final   Triglycerides  Date Value Ref Range Status  08/10/2021 146 <150 mg/dL Final         Passed - Patient is not  pregnant      Passed - Valid encounter within last 12 months    Recent Outpatient Visits           1 month ago Alcoholic cirrhosis of liver with ascites ([place])   [facility] Olin Hauser, DO   2 months ago Lymphedema of both lower extremities   [place], DO   6 months ago Annual physical exam   [place], DO   9 months ago Generalized weakness   [place], DO   1 year ago Lymphedema of both lower extremities   [place], DO       Future Appointments             In 1 week Parks Ranger, Devonne Doughty, DO [facility], [facility]               Requested Prescriptions  Pending Prescriptions Disp Refills   XARELTO 20 MG TABS tablet [Pharmacy Med Name: XARELTO 20 MG Tablet] 90 tablet 3    Sig: TAKE 1 TABLET (20 MG TOTAL) BY MOUTH DAILY WITH SUPPER.     Hematology: Anticoagulants - rivaroxaban Failed - 02/11/2022  8:35 AM      Failed - ALT in normal range and within 360 days    ALT  Date Value Ref Range Status  12/28/2021 8 (L) 9 - 46 U/L Final   SGPT (ALT)  Date Value Ref Range Status  07/25/2014 16 U/L Final    Comment:    14-63 NOTE: New Reference Range 02/10/14          Failed - HCT in normal range and within 360 days    HCT  Date Value Ref Range Status  12/18/2021 33.1 (L) 39.0 - 52.0 % Final   Hematocrit  Date Value Ref Range Status  06/24/2019 40.4 37.5 - 51.0 % Final         Failed - HGB in normal range and within 360 days    Hemoglobin  Date Value Ref Range Status  12/18/2021 11.5 (L) 13.0 - 17.0 g/dL Final  06/24/2019 14.3 13.0 - 17.7 g/dL Final         Passed - AST in normal range and within 360 days    AST  Date Value Ref Range Status  12/28/2021 19 10 - 35 U/L Final   SGOT(AST)  Date Value Ref Range Status  07/25/2014 29 15 - 37  Unit/L Final         Passed - Cr in normal range and within 360 days    Creat  Date Value Ref Range Status  12/28/2021 0.72 0.70 - 1.22 mg/dL Final         Passed - PLT in normal range and within 360 days    Platelets  Date Value Ref Range Status  12/18/2021 191 150 - 400 K/uL Final  06/24/2019 215 150 - 450 x10E3/uL Final         Passed - eGFR is 15 or above and within 360 days    GFR, Est African American  Date Value  Ref Range Status  07/12/2020 97 > OR = 60 mL/min/1.44m Final   GFR, Est Non African American  Date Value Ref Range Status  07/12/2020 84 > OR = 60 mL/min/1.780mFinal   GFR, Estimated  Date Value Ref Range Status  12/18/2021 >60 >60 mL/min Final    Comment:    (NOTE) Calculated using the CKD-EPI Creatinine Equation (2021)    eGFR  Date Value Ref Range Status  12/28/2021 92 > OR = 60 mL/min/1.7378minal    Comment:    The eGFR is based on the CKD-EPI 2021 equation. To calculate  the new eGFR from a previous Creatinine or Cystatin C result, go to https://www.kidney.org/professionals/ kdoqi/gfr%5Fcalculator          Passed - Patient is not pregnant      Passed - Valid encounter within last 12 months    Recent Outpatient Visits           1 month ago [place]coh[place] with [place]   2 months ago Lymphedema of both lower extremities   [place]   6 months ago Annual physical exam   [place]   9 months ago Generalized weakness   [place]   1 year ago Lymphedema of both lower extremities   [place]       Future Appointments             In 1 week [place] [facility]onne [facility]             amLODipine (NORVASC) 5 MG tablet [Pharmacy Med Name:  AMLODIPINE BESYLATE 5 MG Tablet] 90 tablet     Sig: TAKE 1 TABLET (5 MG TOTAL) BY MOUTH DAILY.     Cardiovascular: Calcium Channel Blockers 2 Passed - 02/11/2022  8:35 AM      Passed - Last BP in normal range    BP Readings from Last 1 Encounters:  01/31/22 105/76         Passed - Last Heart Rate in normal range    Pulse Readings from Last 1 Encounters:  01/31/22 67         Passed - Valid encounter within last 6 months    Recent Outpatient Visits           1 month ago Alcoholic [place] of liver with ascites [place]   2 months ago Lymphedema of both lower extremities   [place]   6 months ago Annual physical exam   [place]   9 months ago Generalized weakness   [place]   1 year ago Lymphedema of both lower extremities   [place]       Future Appointments             In 1 week [place]onne Dought[place] [facility] [facility]             atorvastatin (LIPITOR) 40 MG tablet [Pharmacy Med Name: ATORVASTATIN CALCIUM 40 MG Tablet] 90 tablet 3    Sig: TAKE 1 TABLET (40 MG TOTAL) BY MOUTH DAILY.  Cardiovascular:  Antilipid - Statins Failed - 02/11/2022  8:35 AM      Failed - Lipid Panel in normal range within the last 12 months    Cholesterol, Total  Date Value Ref Range Status  06/24/2019 181 100 - 199 mg/dL Final   Cholesterol  Date Value Ref Range Status  08/10/2021 126 <200 mg/dL Final   LDL Cholesterol (Calc)  Date Value Ref Range Status  08/10/2021 35 mg/dL (calc) Final    Comment:    Reference range: <100 . Desirable range <100 mg/dL for primary prevention;   <70 mg/dL for patients with CHD or diabetic patients  with > or = 2 CHD risk factors. Marland Kitchen LDL-C is now calculated using the  Martin-Hopkins  calculation, which is a validated novel method providing  better accuracy than the Friedewald equation in the  estimation of LDL-C.  Cresenciano Genre et al. Annamaria Helling. 5009;381(82): 2061-2068  (http://education.[facility].com/faq/FAQ164)    HDL  Date Value Ref Range Status  08/10/2021 68 > OR = 40 mg/dL Final  06/24/2019 109 >39 mg/dL Final   Triglycerides  Date Value Ref Range Status  08/10/2021 146 <150 mg/dL Final         Passed - Patient is not pregnant      Passed - Valid encounter within last 12 months    Recent Outpatient Visits           1 month ago Alcoholic cirrhosis of liver with ascites [facility])   [facility] Olin Hauser, DO   2 months ago Lymphedema of both lower extremities   [place], DO   6 months ago Annual physical exam   [place], DO   9 months ago Generalized weakness   [place], DO   1 year ago Lymphedema of both lower extremities   [place], DO       Future Appointments             In 1 week Parks Ranger, Devonne Doughty, [facility], [facility]

## 2022-02-22 ENCOUNTER — Encounter: Payer: Self-pay | Admitting: Family Medicine

## 2022-02-22 ENCOUNTER — Ambulatory Visit: Payer: Medicare PPO | Admitting: Family Medicine

## 2022-02-22 VITALS — BP 93/53 | HR 104 | Ht 76.0 in | Wt 210.4 lb

## 2022-02-22 DIAGNOSIS — K7031 Alcoholic cirrhosis of liver with ascites: Secondary | ICD-10-CM

## 2022-02-22 DIAGNOSIS — N62 Hypertrophy of breast: Secondary | ICD-10-CM

## 2022-02-22 DIAGNOSIS — I10 Essential (primary) hypertension: Secondary | ICD-10-CM | POA: Diagnosis not present

## 2022-02-22 DIAGNOSIS — I4819 Other persistent atrial fibrillation: Secondary | ICD-10-CM | POA: Diagnosis not present

## 2022-02-22 DIAGNOSIS — I890 Lymphedema, not elsewhere classified: Secondary | ICD-10-CM

## 2022-02-22 DIAGNOSIS — T50905A Adverse effect of unspecified drugs, medicaments and biological substances, initial encounter: Secondary | ICD-10-CM

## 2022-02-22 MED ORDER — EPLERENONE 50 MG PO TABS
50.0000 mg | ORAL_TABLET | Freq: Every day | ORAL | 3 refills | Status: DC
Start: 2022-02-22 — End: 2022-08-14

## 2022-02-22 NOTE — Patient Instructions (Addendum)
Thank you for coming to the office today.  Please go ahead and call to schedule to with GI upcoming in about 5 months. For follow up routine w/ Dr Parke [facility]) [address] [place], [place] [address] Main: [phone]  ----------------------------------------------  Ordered new fluid pill Eplerenone 50mg  daily to [facility], for 1 year supply.  When you receive the new one, take it once daily and Stop taking Spironolactone 100mg  daily  This should resolve the breast problem, less side effects in this regard.  Do not take Furosemide unless you have significant increase in swelling. As needed.  ------------------------------------------------  Stop taking Donepezil (Aricept) 5mg  daily for memory if uncertain results.   No blood BEFORE next visit, we can draw labs if you are fasting AFTER next visit in the morning.    Please schedule a Follow-up Appointment to: Return in about 6 months (around 08/25/2022) for 6 month Annual Physical AM apt fasting labs AFTER.  If you have any other questions or concerns, please feel free to call the office or send a message through MyChart. You may also schedule an earlier appointment if necessary.  Additionally, you may be receiving a survey about your experience at our office within a few days to 1 week by e-mail or mail. We value your feedback.  , DO [facility], 10/24/2022

## 2022-02-22 NOTE — Progress Notes (Signed)
Subjective:    Patient ID: Craig [name], male    DOB: June 27, 1941, 81 y.o.   MRN: [id number]  Craig [name] is a 81 y.o. male presenting on 02/22/2022 for Follow-up and Cirrhosis   HPI  Recent Upper Endoscopy Dr Allen Norris [facility] 01/2022  Cardiology Permanent Atrial Fibrillation Followed by EP Dr Quentin Ore Cardiology He is managed on Metoprolol XL for rate control and Xarelto for anticoagulation They did not recommend invasive EP procedure at this time.  Hypertension Low BP lately He remains off Losartan and Amlodipine since hospitalization previously. They received Losartan but did not restart it. Continues on Metoprolol for rate and Spironolactone.  Gynecomastia On Spironolactone, admits some breast tenderness and enlargement, asked to change med, as advised by Cardiology  Follow up Lymphedema, lower extremities Generalized Weakness, lower extremities Recent history 04/2021, managed with diuretic and medication and lifestyle modification. He has seen [facility] in interval w Dr Lucky Cowboy   Mild Cognitive Impairment with Memory loss Wife reports increasing difficulty with episodes of confusion and some memory loss impacting him. Interested in treatment options Last ordered 11/2021 Aricept Donepezil 34m daily, uncertain if significant confusion. 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 Continues to take Xarelto daily for anticoagulation      12/23/2021    3:32 PM 12/08/2021   12:09 PM 08/10/2021   10:39 AM  Depression screen PHQ 2/9  Decreased Interest _0 Down, Depressed,  Hopeless 0 0 3  PHQ - 2 Score _1 Altered sleeping 3  3  Tired, decreased energy 3  3  Change in appetite 1  1  Feeling bad or failure about yourself  1  1  Trouble concentrating 3  3  Moving slowly or fidgety/restless 1  1  Suicidal thoughts 0  0  PHQ-9 Score 13  18  Difficult doing work/chores Somewhat difficult  Extremely dIfficult    Social History   Tobacco Use   Smoking status: Former    Years: 20.00    Types: Cigarettes    Quit date: 07/24/1978    Years since quitting: 43.6   Smokeless tobacco: Former   Tobacco comments:    Quit tobacco in 1980  Vaping Use   Vaping Use: Never used  Substance Use Topics   Alcohol use: Yes    Alcohol/week: 3.0 standard drinks of alcohol    Types: 3 Shots of liquor per week    Comment: daily   Drug use: No    Review of Systems Per HPI unless specifically indicated above     Objective:    BP (!) 93/53   Pulse (!) 104   Ht 6' 4" (1.93 m)   Wt 210 lb 6.4 oz (95.4 kg)   SpO2 95%   BMI 25.61 kg/m   Wt Readings from Last 3 Encounters:  02/22/22 210 lb 6.4 oz (95.4 kg)  01/31/22 194 lb 0.1 oz (88 kg)  01/25/22 194 lb (88 kg)    Physical Exam Vitals and nursing note reviewed.  Constitutional:      General: He is not in acute distress.  Appearance: He is well-developed. He is obese. He is not diaphoretic.     Comments: Well-appearing, comfortable, cooperative  HENT:     Head: Normocephalic and atraumatic.  Eyes:     General:        Right eye: No discharge.        Left eye: No discharge.     Conjunctiva/sclera: Conjunctivae normal.  Neck:     Thyroid: No thyromegaly.  Cardiovascular:     Rate and Rhythm: Normal rate. Rhythm irregular.     Pulses: Normal pulses.     Heart sounds: Normal heart sounds. No murmur heard. Pulmonary:     Effort: Pulmonary effort is normal. No respiratory distress.     Breath sounds: Normal breath sounds. No wheezing or rales.  Musculoskeletal:        General: Normal range of motion.      Cervical back: Normal range of motion and neck supple.     Right lower leg: No edema (trace edema only with excess skin, dryness. dramatic resolved edema).     Left lower leg: No edema.  Lymphadenopathy:     Cervical: No cervical adenopathy.  Skin:    General: Skin is warm and dry.     Findings: No erythema or rash.  Neurological:     Mental Status: He is alert and oriented to person, place, and time. Mental status is at baseline.  Psychiatric:        Behavior: Behavior normal.     Comments: Well groomed, good eye contact, normal speech and thoughts       Results for orders placed or performed in visit on 12/28/21  COMPLETE METABOLIC PANEL WITH GFR  Result Value Ref Range   Glucose, Bld 103 (H) 65 - 99 mg/dL   BUN 12 7 - 25 mg/dL   Creat 0.72 0.70 - 1.22 mg/dL   eGFR 92 > OR = 60 mL/min/1.55m   BUN/Creatinine Ratio NOT APPLICABLE 6 - 22 (calc)   Sodium 141 135 - 146 mmol/L   Potassium 4.0 3.5 - 5.3 mmol/L   Chloride 100 98 - 110 mmol/L   CO2 33 (H) 20 - 32 mmol/L   Calcium 9.6 8.6 - 10.3 mg/dL   Total Protein 6.6 6.1 - 8.1 g/dL   Albumin 3.5 (L) 3.6 - 5.1 g/dL   Globulin 3.1 1.9 - 3.7 g/dL (calc)   AG Ratio 1.1 1.0 - 2.5 (calc)   Total Bilirubin 1.0 0.2 - 1.2 mg/dL   Alkaline phosphatase (APISO) 70 35 - 144 U/L   AST 19 10 - 35 U/L   ALT 8 (L) 9 - 46 U/L      Assessment & Plan:   Problem List Items Addressed This Visit     Essential hypertension (Chronic)   Relevant Medications   eplerenone (INSPRA) 50 MG tablet   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 No changes to this plan.  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  When you receive the new one, take it once daily and Stop taking Spironolactone 1073mdaily This should resolve the breast problem, less side effects in this regard.  Do not take Furosemide unless you have significant increase in swelling. As needed.  ------------------------------------------------  Memory loss Improved since hospitalization treatment recently  No improvement. He can Stop taking Donepezil (Aricept) 5m76maily for memory if uncertain results.   Meds ordered this encounter  Medications   eplerenone (INSPRA) 50 MG tablet    Sig: Take 1 tablet (50 mg total) by mouth daily.    Dispense:  90 tablet    Refill:  3    Stop taking Spironolactone due to side effect, switch to Eplerenone    Follow up plan: Return in about 6 months (around 08/25/2022) for 6 month Annual Physical AM apt fasting labs AFTER.  Future no labs ordered, will talk and see what other doctors have ordered and then place his orders fasting AM.   [place]ie [place]nam[place] 02/22/2022, 10:43 AM

## 2022-03-06 ENCOUNTER — Other Ambulatory Visit: Payer: Self-pay | Admitting: Family Medicine

## 2022-03-06 DIAGNOSIS — G3184 Mild cognitive impairment, so stated: Secondary | ICD-10-CM

## 2022-03-06 NOTE — Telephone Encounter (Signed)
Refused the Aricept 5 mg because it was discontinued 02/22/2022.

## 2022-03-22 ENCOUNTER — Other Ambulatory Visit: Payer: Self-pay | Admitting: Family Medicine

## 2022-03-22 ENCOUNTER — Ambulatory Visit: Payer: Self-pay

## 2022-03-22 DIAGNOSIS — K5900 Constipation, unspecified: Secondary | ICD-10-CM

## 2022-03-22 DIAGNOSIS — K7031 Alcoholic cirrhosis of liver with ascites: Secondary | ICD-10-CM

## 2022-03-22 MED ORDER — LACTULOSE 10 GM/15ML PO SOLN: 10.0000 g | Freq: Every day | ORAL | 1 refills | Status: DC | PRN

## 2022-03-22 NOTE — Patient Instructions (Signed)
Visit Information  Thank you for taking time to visit with me today. Please don't hesitate to contact me if I can be of assistance to you.   Following are the goals we discussed today:   Goals Addressed             This Visit's Progress    RNCM: Effective Management of Cirrhosis       Care Coordination Interventions: Evaluation of current treatment plan related to cirrhosis of the  and patient's adherence to plan as established by provider. The patients significant other states that she weighs the patient daily and he has went from 190 to 217 and is now back around to 210. She is concerned about the weight changes and new concerns she has related to the patients cirrhosis. Advised patient to monitor for weight gain, shortness of breath, changes in memory, and other sx and sx of worsening conditions associated with cirrhosis of the liver Provided education to patient re: review of esophageal varices , memory changes, wight gain, memory changes. Will attach information in myChart for education and support of effective management of cirrhosis of the liver Reviewed medications with patient and discussed compliance. Meriam Sprague, the patients significant other states the patient has had to start taking the Lasix daily as well as the medication prescribed by pcp. He is no longer taking the spironolactone. The patient is not on lactulose. Education and support given to [place] concerning the working action of Lactulose.  Collaborated with pcp regarding the patient with constipation and not going to bathroom for 3 to 4 days, weight gain, memory changes, and asking for recommendations for the patient and care giver. The patient is not currently on Lactulose. Dr. Althea Charon will review the chart and order medication as appropriate for the patient.  Provided patient with Lactulose and general information  educational materials related to effective management of cirrhosis of the liver Reviewed scheduled/upcoming  provider appointments including appointments with the GI specialist on 07-03-2022 and pcp in January. Education given that the patient may need to be seen sooner for further evaluation and treatment option due to increasing weight and changes that are occurring. Discussed plans with patient for ongoing care management follow up and provided patient with direct contact information for care management team Advised patient to discuss bowel habits, fluid retention, memory changes and other changes in the patients condition with provider Screening for signs and symptoms of depression related to chronic disease state  Assessed social determinant of health barriers AWV has been completed for 2023 Patient interviewed about adult health maintenance status including  Depression screen    Falls risk assessment    Advised patient to discuss  liver transplant, other treatment options for effective management of cirrhosis  with primary care provider  03-22-2022: The patients care giver agrees to regular outreaches from the [facility]. Information provided on how to reach the [facility] and to call for new concerns or questions.            Our next appointment is by telephone on 04-19-2022 at 1 pm  Please call the care guide team at [phone] if you need to cancel or reschedule your appointment.   If you are experiencing a Mental Health or Behavioral Health Crisis or need someone to talk to, please call the Suicide and Crisis Lifeline: 988 call the [place] National Suicide Prevention Lifeline: (847)426-2192 or TTY: 670-696-4534 TTY 442 044 1842) to talk to a trained counselor call 1-800-273-TALK (toll free, 24 hour hotline)  Patient verbalizes understanding of  instructions and care plan provided today and agrees to view in MyChart. Active MyChart status and patient understanding of how to access instructions and care plan via MyChart confirmed with patient.     Telephone follow up appointment with care management  team member scheduled for: 04-19-2022 at 1 pm  Alto Denver RN, MSN, CCM Community Care Coordinator [facility]  [facility] Mobile: [phone]    Alcohol Misuse and Dependence Information, Adult Alcohol is a widely available drug and people choose to drink alcohol in different amounts. Alcohol misuse and dependence can have a negative effect on your life. Alcohol misuse is when you use alcohol too much or too often. You may have a hard time setting a limit on the amount you drink. Alcohol dependence is when you use alcohol consistently for a period of time, and your body changes as a result. Alcohol dependence can make it hard for you to stop drinking because you may start to feel sick or different when you do not drink alcohol. These symptoms are known as withdrawal. People who drink alcohol very often and in large amounts, may develop what is called an alcohol use disorder. How can alcohol misuse and dependence affect me? Drinking too much can lead to addiction. You may feel like you need alcohol to function normally. You may drink alcohol before work in the morning, during the day, or as soon as you get home from work in the evening. These actions can result in: Poor work performance. Job loss. Financial problems. Car crashes or criminal charges from driving after drinking alcohol. Problems in your relationships with friends and family. Losing the trust and respect of coworkers, friends, and family. Drinking heavily over a long period of time can permanently damage your body and brain, and can cause lifelong health issues, such as: Damage to your liver or pancreas. Heart problems, high blood pressure, or stroke. Certain cancers. Decreased ability to fight infections. Brain or nerve damage. Depression. Early death, also called premature death. If you are careless or you crave alcohol, it is easy to drink more than your body can handle (overdose). Alcohol overdose is a serious  situation that requires hospitalization. It may lead to permanent injuries or death. What can increase my risk? Having a family history of alcohol misuse. Having depression or other mental health conditions. Beginning to drink at an early age. Binge drinking often. Experiencing trauma, stress, and an unstable home life during childhood. Spending time with people who drink often. What actions can I take to prevent alcohol misuse and dependence? Do not drink alcohol if: Your health care provider tells you not to drink. You are pregnant, may be pregnant, or are planning to become pregnant. If you drink alcohol: Limit how much you have to: 0-1 drink a day for women who are not pregnant. 0-2 drinks a day for men. Know how much alcohol is in your drink. In the [place] one drink equals one 12 oz bottle of beer (355 mL), one 5 oz glass of wine (148 mL), or one 1 oz glass of hard liquor (44 mL). If you think you have an alcohol dependency problem, decide to stop drinking. This can be very hard to do if you are used to frequently drinking alcohol. If you begin to have withdrawal symptoms, talk with your health care provider or a person that you trust. These symptoms may include anxiety, shaky hands, headache, nausea, sweating, or not being able to sleep. Choose to drink nonalcoholic beverages in  social gatherings and places where there may be alcohol. Activity Spend more time on activities that you enjoy that do not involve alcohol, like hobbies or exercise. Find healthy ways to cope with stress, such as meditation or spending time with people you care about. General information Talk to your family, coworkers, and friends about supporting you in your efforts to stop drinking. If they drink, ask them not to drink around you. Spend more time with people who do not drink alcohol. If you think that you have an alcohol dependency problem: Tell friends or family about your concerns. Talk with your health  care provider or another health professional about where to get help. Work with a Paramedic and a Network engineer. Consider joining a support group for people who struggle with alcohol misuse and dependence. Where to find support  Your health care provider. SMART Recovery: smartrecovery.org Local treatment centers or chemical dependency counselors. Local AA groups in your community: CustomizedRugs.fi Where to find more information Centers for Disease Control and Prevention: TonerPromos.no [employer] on Alcohol Abuse and Alcoholism: BackupSupply.hu Alcoholics Anonymous (AA): CustomizedRugs.fi Contact a health care provider if: You drank more or for longer than you intended on more than one occasion. You often drink to the point of vomiting or passing out. You have problems in your life due to drinking, but you continue to drink. You keep drinking even though you feel anxious, depressed, or have experienced memory loss. You have stopped doing the things you used to enjoy in order to drink. You have to drink more than you used to in order to get the effect you want. You experience anxiety, sweating, nausea, shakiness, and trouble sleeping when you try to stop drinking. Get help right away if: You have serious withdrawal symptoms, including: Confusion. Racing heart. High blood pressure. Fever. These symptoms may be an emergency. Get help right away. Call 911. Do not wait to see if the symptoms will go away. Do not drive yourself to the hospital. Also, get help right away if: You have thoughts about hurting yourself or others. Take one of these steps if you feel like you may hurt yourself or others, or have thoughts about taking your own life: Call 911. Call the National Suicide Prevention Lifeline at 618-554-1567 or 988. This is open 24 hours a day. Text the Crisis Text Line at (914)833-2128. Summary Alcohol misuse and dependence can have a negative effect on your life. Drinking too much or too  often can lead to addiction. If you drink alcohol, limit how much you use. If you are having trouble keeping your drinking under control, find ways to change your behavior. Hobbies, calming activities, exercise, or support groups can help. If you feel you need help with changing your drinking habits, talk with your health care provider, a good friend, or a therapist, or go to a support group. This information is not intended to replace advice given to you by your health care provider. Make sure you discuss any questions you have with your health care provider. Document Revised: 09/14/2021 Document Reviewed: 09/14/2021 Elsevier Patient Education  2023 Elsevier Inc.   Lactulose Solution (Encephalopathy) What is this medication? LACTULOSE (LAK tyoo lose) prevents and treats hepatic encephalopathy, a condition where the liver does not clear toxins from the body. It works by decreasing the amount of toxins in the blood. This decreases symptoms. This medicine may be used for other purposes; ask your health care provider or pharmacist if you have questions. COMMON BRAND NAME(S):  Acilac, Cephulac, Cholac, Chronulac, Constilac, Constulose, Enulose, Generlac What should I tell my care team before I take this medication? They need to know if you have any of these conditions: Need a galactose-free diet Scheduled for surgery An unusual or allergic reaction to lactulose, other sugars, medications, foods, dyes or preservatives Pregnant or trying to get pregnant Breast-feeding How should I use this medication? Take this medication mouth. Take it as directed on the prescription label at the same time every day. Use a specially marked oral syringe, spoon, or dropper to measure each dose. Ask your pharmacist if you do not have one. Household spoons are not accurate. Keep taking it unless your care team tells you to stop. You may be directed to take this medication rectally. If so, you must follow specific  directions from your care team. Please contact them. Talk to your care team about the use of this medication in children. While it may be prescribed for children as young as 1 month for selected conditions, precautions do apply. Overdosage: If you think you have taken too much of this medicine contact a poison control center or emergency room at once. NOTE: This medicine is only for you. Do not share this medicine with others. What if I miss a dose? If you miss a dose, take it as soon as you can. If it is almost time for your next dose, take only that dose. Do not take double or extra doses. What may interact with this medication? Antacids Neomycin Other laxatives This list may not describe all possible interactions. Give your health care provider a list of all the medicines, herbs, non-prescription drugs, or dietary supplements you use. Also tell them if you smoke, drink alcohol, or use illegal drugs. Some items may interact with your medicine. What should I watch for while using this medication? Visit your care team for regular checks on your progress. Tell your care team if your symptoms do not start to get better or if they get worse. This medication may not produce any result for 24 to 48 hours. Do not take this medication for longer than directed by your care team. Drink plenty of water with each dose of this medication. What side effects may I notice from receiving this medication? Side effects that you should report to your care team as soon as possible: Allergic reactions--skin rash, itching, hives, swelling of the face, lips, tongue, or throat Side effects that usually do not require medical attention (report these to your care team if they continue or are bothersome): Diarrhea Gas Nausea Stomach cramping Vomiting This list may not describe all possible side effects. Call your doctor for medical advice about side effects. You may report side effects to FDA at 1-800-FDA-1088. Where  should I keep my medication? Keep out of the reach of children and pets. This medication may darken in color under normal storage conditions. This is because of the sugar solution and does not affect the way the medication works. If the solution becomes extremely dark in color, contact your care team before use. Store at room temperature between 20 and 25 degrees C (68 and 77 degrees F). Do not freeze. Keep the container tightly closed. Get rid of any unused medication after the expiration date. To get rid of medications that are no longer needed or have expired: Take the medication to a medication take-back program. Check with your pharmacy or law enforcement to find a location. If you cannot return the medication, check the label or  package insert to see if the medication should be thrown out in the garbage or flushed down the toilet. If you are not sure, ask your care team. If it is safe to put it in the trash, pour the medication out of the container. Mix the medication with cat litter, dirt, coffee grounds, or other unwanted substance. Seal the mixture in a bag or container. Put it in the trash. NOTE: This sheet is a summary. It may not cover all possible information. If you have questions about this medicine, talk to your doctor, pharmacist, or health care provider.  2023 Elsevier/Gold Standard (2021-06-01 00:00:00)    Alcoholic Liver Disease  Alcoholic liver disease is liver damage that is caused by drinking a lot of alcohol for a long time. If you have this disease, you must stop drinking alcohol. What are the causes? This condition is caused by long-term (chronic) heavy alcohol use. What increases the risk? This condition is more likely to appear in: Women. People who are very overweight. People who have a family history of this disease. People who often drink large amounts of alcohol in a short amount of time (binge drink). People who have another liver disease. People who do not  eat a healthy diet. People who lack certain nutrients in their diet, such as folate or thiamine. What are the signs or symptoms? Most people do not have symptoms in the early stages of this disease. If early symptoms do appear, they may include: Loss of appetite. Feeling like you may vomit, or vomiting. Symptoms of moderate disease include: Watery poop (diarrhea). A fever. Feeling tired (fatigue). Weight loss. The skin and the whites of the eyes turning yellow (jaundice). Pain and swelling in the belly (abdomen). Symptoms of advanced disease include: Weight loss and muscle loss. Itchy skin. Increase of fluid in the belly (ascites). Swelling of the feet and ankles (edema). Nosebleeds or bleeding gums. Trouble concentrating. Mood changes or confusion. How is this treated? The most important part of treatment is to stop drinking alcohol. You may also: Take medicine to decrease symptoms caused by stopping alcohol use (withdrawal symptoms). Enter a treatment program to help you stop drinking. Join a support group. Eat a healthy diet. This includes foods that have a lot of zinc or B vitamins in them. Take vitamins. Take medicines to decrease liver swelling (inflammation). Get a donated liver (liver transplant). This is only done in very bad cases. It is only for those who have stopped drinking and can commit to never drinking again. Follow these instructions at home:  Do not drink alcohol. Follow your treatment plan. Work with your doctor if you need help. Think about joining an alcohol support group. Take over-the-counter and prescription medicines only as told by your doctor. This includes vitamins. Do not use medicines or eat foods that have alcohol in them, unless your doctor says that it is safe. Follow instructions from your doctor about eating a healthy diet. Keep all follow-up visits. Where to find support Alcoholics Anonymous (AA): CustomizedRugs.fi Contact a doctor if: You get a  fever or chills. Your skin starts to look more yellow, pale, or dark. You get headaches. Get help right away if: You vomit blood. You have bright red blood in your poop (stool). Your poop looks black or looks like tar. You have trouble: Thinking. Walking. Balancing. Breathing. These symptoms may be an emergency. Get help right away. Call your local emergency services (911 in the [place] Do not wait to see if the  symptoms will go away. Do not drive yourself to the hospital. Summary Alcoholic liver disease is liver damage that is caused by drinking a lot of alcohol for a long time. If you have this disease, you must stop drinking alcohol. Follow your treatment plan, and work with your doctor as needed. Think about joining an alcohol support group. This information is not intended to replace advice given to you by your health care provider. Make sure you discuss any questions you have with your health care provider. Document Revised: 04/22/2020 Document Reviewed: 04/22/2020 Elsevier Patient Education  2023 [employer]

## 2022-03-22 NOTE — Patient Outreach (Signed)
Care Coordination   Initial Visit Note   03/22/2022 Name: Craig [name] MRN: [id number] DOB: Nov 06, 1940  Craig [name] is a 81 y.o. year old male who sees Smitty Cords, DO for primary care. I spoke with  [name] by phone today.  What matters to the patients health and wellness today?  The patient is gaining weight again, memory is changing, and no BM in 3 to 4 days    Goals Addressed             This Visit's Progress    RNCM: Effective Management of Cirrhosis       Care Coordination Interventions: Evaluation of current treatment plan related to cirrhosis of the  and patient's adherence to plan as established by provider. The patients significant other states that she weighs the patient daily and he has went from 190 to 217 and is now back around to 210. She is concerned about the weight changes and new concerns she has related to the patients cirrhosis. Advised patient to monitor for weight gain, shortness of breath, changes in memory, and other sx and sx of worsening conditions associated with cirrhosis of the liver Provided education to patient re: review of esophageal varices , memory changes, wight gain, memory changes. Will attach information in myChart for education and support of effective management of cirrhosis of the liver Reviewed medications with patient and discussed compliance. Meriam Sprague, the patients significant other states the patient has had to start taking the Lasix daily as well as the medication prescribed by pcp. He is no longer taking the spironolactone. The patient is not on lactulose. Education and support given to [place] concerning the working action of Lactulose.  Collaborated with pcp regarding the patient with constipation and not going to bathroom for 3 to 4 days, weight gain, memory changes, and asking for recommendations for the patient and care giver. The patient is not currently on Lactulose. Dr. Althea Charon will review the chart and order  medication as appropriate for the patient.  Provided patient with Lactulose and general information  educational materials related to effective management of cirrhosis of the liver Reviewed scheduled/upcoming provider appointments including appointments with the GI specialist on 07-03-2022 and pcp in January. Education given that the patient may need to be seen sooner for further evaluation and treatment option due to increasing weight and changes that are occurring. Discussed plans with patient for ongoing care management follow up and provided patient with direct contact information for care management team Advised patient to discuss bowel habits, fluid retention, memory changes and other changes in the patients condition with provider Screening for signs and symptoms of depression related to chronic disease state  Assessed social determinant of health barriers AWV has been completed for 2023 Patient interviewed about adult health maintenance status including  Depression screen    Falls risk assessment    Advised patient to discuss  liver transplant, other treatment options for effective management of cirrhosis  with primary care provider  03-22-2022: The patients care giver agrees to regular outreaches from the [facility]. Information provided on how to reach the [facility] and to call for new concerns or questions.            SDOH assessments and interventions completed:  Yes  SDOH Interventions Today    Flowsheet Row Most Recent Value  SDOH Interventions   Food Insecurity Interventions Intervention Not Indicated  Financial Strain Interventions Intervention Not Indicated  Housing Interventions Intervention Not Indicated  Physical Activity  Interventions Other (Comments)  [no structured activity, per Meriam Sprague he sleeps a lot]  Stress Interventions Other (Comment)  [patients health is declining and he is having issues and concerns]  Social Connections Interventions Other (Comment)  [has a good  support system]  Transportation Interventions Intervention Not Indicated  [patient does not drive, Meriam Sprague takes him to his appointments]        Care Coordination Interventions Activated:  Yes  Care Coordination Interventions:  Yes, provided   Follow up plan: Follow up call scheduled for 04-19-2022 at 1 pm    Encounter Outcome:  Pt. Visit Completed   Alto Denver RN, MSN, CCM Community Care Coordinator [facility]  [facility] Mobile: [phone]

## 2022-03-30 ENCOUNTER — Other Ambulatory Visit: Payer: Self-pay | Admitting: Family Medicine

## 2022-03-30 DIAGNOSIS — K7031 Alcoholic cirrhosis of liver with ascites: Secondary | ICD-10-CM

## 2022-03-30 DIAGNOSIS — K5900 Constipation, unspecified: Secondary | ICD-10-CM

## 2022-03-30 DIAGNOSIS — H25813 Combined forms of age-related cataract, bilateral: Secondary | ICD-10-CM | POA: Diagnosis not present

## 2022-03-31 NOTE — Telephone Encounter (Signed)
Unable to refill per protocol, last refill by provider 03/22/22 for 473 mL, possible duplicate, will refuse medication.  Requested Prescriptions  Pending Prescriptions Disp Refills  . lactulose (CHRONULAC) 10 GM/15ML solution [Pharmacy Med Name: LACTULOSE 10 GM/15 ML SOLUTION] 473 mL 1    Sig: TAKE 15-30 MLS BY MOUTH DAILY AS NEEDED FOR MILD CONSTIPATION OR MODERATE CONSTIPATION.     Gastroenterology:  Laxatives - lactulose Failed - 03/30/2022  2:34 PM      Failed - CO2 in normal range and within 360 days    CO2  Date Value Ref Range Status  12/28/2021 33 (H) 20 - 32 mmol/L Final   Co2  Date Value Ref Range Status  07/25/2014 24 21 - 32 mmol/L Final         Passed - Cl in normal range and within 360 days    Chloride  Date Value Ref Range Status  12/28/2021 100 98 - 110 mmol/L Final  07/25/2014 109 (H) 98 - 107 mmol/L Final         Passed - K in normal range and within 360 days    Potassium  Date Value Ref Range Status  12/28/2021 4.0 3.5 - 5.3 mmol/L Final  07/25/2014 3.4 (L) 3.5 - 5.1 mmol/L Final         Passed - Na in normal range and within 360 days    Sodium  Date Value Ref Range Status  12/28/2021 141 135 - 146 mmol/L Final  06/24/2019 146 (H) 134 - 144 mmol/L Final  07/25/2014 141 136 - 145 mmol/L Final         Passed - Valid encounter within last 12 months    Recent Outpatient Visits          1 month ago Alcoholic cirrhosis of liver with ascites [facility])   [facility] [place], Netta Neat, DO   3 months ago Alcoholic cirrhosis of liver with ascites [facility])   [facility] Smitty Cords, DO   3 months ago Lymphedema of both lower extremities   [facility] [place], Netta Neat, DO   7 months ago Annual physical exam   [facility] Smitty Cords, DO   10 months ago Generalized weakness   [facility], Netta Neat, DO      Future Appointments             In 3 months Midge Minium, MD [facility] [place]   In 4 months Althea Charon, Netta Neat, DO [facility], [facility]

## 2022-04-19 ENCOUNTER — Ambulatory Visit: Payer: Self-pay

## 2022-04-19 ENCOUNTER — Other Ambulatory Visit: Payer: Self-pay | Admitting: Family Medicine

## 2022-04-19 DIAGNOSIS — K7031 Alcoholic cirrhosis of liver with ascites: Secondary | ICD-10-CM

## 2022-04-19 DIAGNOSIS — K5900 Constipation, unspecified: Secondary | ICD-10-CM

## 2022-04-19 MED ORDER — LACTULOSE 10 GM/15ML PO SOLN: 10.0000 g | Freq: Every day | ORAL | 3 refills | Status: DC | PRN

## 2022-04-19 NOTE — Patient Instructions (Signed)
Visit Information  Thank you for taking time to visit with me today. Please don't hesitate to contact me if I can be of assistance to you.   Following are the goals we discussed today:   Goals Addressed             This Visit's Progress    RNCM: Effective Management of Cirrhosis       Care Coordination Interventions: Evaluation of current treatment plan related to cirrhosis of the  and patient's adherence to plan as established by provider. Meriam Sprague has been keeping a record of the patients weight. He was 219 when he came home from the hospital, went down to 190 and is back up to 219. Education on calling the office for more that 5 pounds in one day. Secure message sent to the pcp for recommendations. Advised patient to monitor for weight gain, shortness of breath, changes in memory, and other sx and sx of worsening conditions associated with cirrhosis of the liver. Review of worsening sx and sx of condition and review of how to respond if the patient  has acute onset of weight gain and shortness of breath.  Provided education to patient re: review of esophageal varices , memory changes, wight gain, memory changes. Will attach information in myChart for education and support of effective management of cirrhosis of the liver Reviewed medications with patient and discussed compliance. Meriam Sprague, the patients significant other states the patient continues to take  Lasix daily. She states that the Lactulose has helped with the patients constipation and also she can tell a positive in his mental clearness. She is picking up the last refill of Lactulose today. Asked if this could be refilled. Secure message sent to the pcp for assistance and recommendations.  Collaborated with pcp regarding the concerns Meriam Sprague has with the patients weight. Meriam Sprague states that he is also hurting in his chest but not as bad. Education and support given. Will let Meriam Sprague know the recommendations from the pcp.  Provided  patient with Lactulose and general information  educational materials related to effective management of cirrhosis of the liver Reviewed scheduled/upcoming provider appointments including appointments with the GI specialist on 07-03-2022 and pcp in January. Education given that the patient may need to be seen sooner for further evaluation and treatment option due to increasing weight and changes that are occurring. Reminder given that the patient may need to be seen sooner due to weight changes. Will see what the recommendations of the pcp are.  Discussed plans with patient for ongoing care management follow up and provided patient with direct contact information for care management team Advised patient to discuss bowel habits, fluid retention, memory changes and other changes in the patients condition with provider Screening for signs and symptoms of depression related to chronic disease state  Assessed social determinant of health barriers AWV has been completed for 2023 Patient interviewed about adult health maintenance status including  Depression screen    Falls risk assessment    Advised patient to discuss  liver transplant, other treatment options for effective management of cirrhosis  with primary care provider. Meriam Sprague states that Dr. Servando Snare expressed the patient would not be a candidate for liver transplantation.   Claudette Head that when the [facility] heard back from the pcp that the [facility] would call her back and give her the recommendations from the provider.            Our next appointment is by telephone on 06-21-2022 at 130 pm  Please call the care guide team at [phone] if you need to cancel or reschedule your appointment.   If you are experiencing a Mental Health or [place] or need someone to talk to, please call the Suicide and Crisis Lifeline: 988 call the [place] National Suicide Prevention Lifeline: 8088624331 or TTY: 831 103 1966 TTY 985-703-3534) to talk  to a trained counselor call 1-800-273-TALK (toll free, 24 hour hotline)  Patient verbalizes understanding of instructions and care plan provided today and agrees to view in [place]. Active MyChart status and patient understanding of how to access instructions and care plan via MyChart confirmed with patient.     Telephone follow up appointment with care management team member scheduled for: 06-21-2022 at 130 pm  [place], MSN, [place]  Mobile: [phone]

## 2022-04-19 NOTE — Patient Outreach (Signed)
Care Coordination   Follow Up Visit Note   04/19/2022 Name: Craig [name] MRN: [id number] DOB: 08/27/1940  Craig [name] is a 81 y.o. year old male who sees Olin Hauser, DO for primary care. I  spoke with Guido Sander, the patients significant other and DRP.  What matters to the patients health and wellness today?  The patient is gaining weight and another refill for Lactulose is needed.     Goals Addressed             This Visit's Progress    RNCM: Effective Management of Cirrhosis       Care Coordination Interventions: Evaluation of current treatment plan related to cirrhosis of the  and patient's adherence to plan as established by provider. Rise Paganini has been keeping a record of the patients weight. He was 219 when he came home from the hospital, went down to 190 and is back up to 219. Education on calling the office for more that 5 pounds in one day. Secure message sent to the pcp for recommendations. Advised patient to monitor for weight gain, shortness of breath, changes in memory, and other sx and sx of worsening conditions associated with cirrhosis of the liver. Review of worsening sx and sx of condition and review of how to respond if the patient  has acute onset of weight gain and shortness of breath.  Provided education to patient re: review of esophageal varices , memory changes, wight gain, memory changes. Will attach information in myChart for education and support of effective management of cirrhosis of the liver Reviewed medications with patient and discussed compliance. Rise Paganini, the patients significant other states the patient continues to take  Lasix daily. She states that the Lactulose has helped with the patients constipation and also she can tell a positive in his mental clearness. She is picking up the last refill of Lactulose today. Asked if this could be refilled. Secure message sent to the pcp for assistance and recommendations.  Collaborated with pcp  regarding the concerns Rise Paganini has with the patients weight. Rise Paganini states that he is also hurting in his chest but not as bad. Education and support given. Will let Rise Paganini know the recommendations from the pcp.  Provided patient with Lactulose and general information  educational materials related to effective management of cirrhosis of the liver Reviewed scheduled/upcoming provider appointments including appointments with the GI specialist on 07-03-2022 and pcp in January. Education given that the patient may need to be seen sooner for further evaluation and treatment option due to increasing weight and changes that are occurring. Reminder given that the patient may need to be seen sooner due to weight changes. Will see what the recommendations of the pcp are.  Discussed plans with patient for ongoing care management follow up and provided patient with direct contact information for care management team Advised patient to discuss bowel habits, fluid retention, memory changes and other changes in the patients condition with provider Screening for signs and symptoms of depression related to chronic disease state  Assessed social determinant of health barriers AWV has been completed for 2023 Patient interviewed about adult health maintenance status including  Depression screen    Falls risk assessment    Advised patient to discuss  liver transplant, other treatment options for effective management of cirrhosis  with primary care provider. Rise Paganini states that Dr. Allen Norris expressed the patient would not be a candidate for liver transplantation.   Warner Mccreedy that when the [facility] heard back  from the pcp that the [facility] would call her back and give her the recommendations from the provider.            SDOH assessments and interventions completed:  Yes  SDOH Interventions Today    Flowsheet Row Most Recent Value  SDOH Interventions   Food Insecurity Interventions Intervention Not Indicated   Housing Interventions Intervention Not Indicated  Utilities Interventions Intervention Not Indicated        Care Coordination Interventions Activated:  Yes  Care Coordination Interventions:  Yes, provided   Follow up plan: Follow up call scheduled for 06-21-2022 at 130 pm    Encounter Outcome:  Pt. Visit Completed   Noreene Larsson RN, MSN, [place]  Mobile: [phone]

## 2022-04-26 ENCOUNTER — Other Ambulatory Visit: Payer: Self-pay | Admitting: Family Medicine

## 2022-04-26 DIAGNOSIS — F5101 Primary insomnia: Secondary | ICD-10-CM

## 2022-04-26 DIAGNOSIS — K7031 Alcoholic cirrhosis of liver with ascites: Secondary | ICD-10-CM

## 2022-04-26 DIAGNOSIS — K5900 Constipation, unspecified: Secondary | ICD-10-CM

## 2022-04-26 DIAGNOSIS — F331 Major depressive disorder, recurrent, moderate: Secondary | ICD-10-CM

## 2022-04-26 NOTE — Telephone Encounter (Signed)
rx was sent to pharmacy on 04/19/22 with 3 RF.  Requested Prescriptions  Refused Prescriptions Disp Refills  . lactulose (CHRONULAC) 10 GM/15ML solution [Pharmacy Med Name: LACTULOSE 10 GM/15 ML SOLUTION] 473 mL 1    Sig: TAKE 15-30 MLS BY MOUTH DAILY AS NEEDED FOR MILD CONSTIPATION OR MODERATE CONSTIPATION.     Gastroenterology:  Laxatives - lactulose Failed - 04/26/2022 12:31 PM      Failed - CO2 in normal range and within 360 days    CO2  Date Value Ref Range Status  12/28/2021 33 (H) 20 - 32 mmol/L Final   Co2  Date Value Ref Range Status  07/25/2014 24 21 - 32 mmol/L Final         Passed - Cl in normal range and within 360 days    Chloride  Date Value Ref Range Status  12/28/2021 100 98 - 110 mmol/L Final  07/25/2014 109 (H) 98 - 107 mmol/L Final         Passed - K in normal range and within 360 days    Potassium  Date Value Ref Range Status  12/28/2021 4.0 3.5 - 5.3 mmol/L Final  07/25/2014 3.4 (L) 3.5 - 5.1 mmol/L Final         Passed - Na in normal range and within 360 days    Sodium  Date Value Ref Range Status  12/28/2021 141 135 - 146 mmol/L Final  06/24/2019 146 (H) 134 - 144 mmol/L Final  07/25/2014 141 136 - 145 mmol/L Final         Passed - Valid encounter within last 12 months    Recent Outpatient Visits          2 months ago Alcoholic cirrhosis of liver with ascites [facility])   [place], DO   4 months ago Alcoholic cirrhosis of liver with ascites [facility])   [place], DO   4 months ago Lymphedema of both lower extremities   [place], DO   8 months ago Annual physical exam   [place], DO   11 months ago Generalized weakness   [place], DO      Future Appointments            In 2 months Lucilla Lame, MD [place]   In 3 months  [place], Devonne Doughty, DO [facility], [facility]

## 2022-04-26 NOTE — Telephone Encounter (Signed)
Requested medication (s) are due for refill today: possibly  Requested medication (s) are on the active medication list:no  Last refill:  02/13/22  Future visit scheduled:no  Notes to clinic:  Unable to refill per protocol, Rx expired. Medication is not on current list, routing for review     Requested Prescriptions  Pending Prescriptions Disp Refills   losartan (COZAAR) 100 MG tablet [Pharmacy Med Name: LOSARTAN POTASSIUM 100 MG Tablet] 90 tablet     Sig: TAKE 1 TABLET EVERY DAY     Cardiovascular:  Angiotensin Receptor Blockers Passed - 04/26/2022  3:23 AM      Passed - Cr in normal range and within 180 days    Creat  Date Value Ref Range Status  12/28/2021 0.72 0.70 - 1.22 mg/dL Final         Passed - K in normal range and within 180 days    Potassium  Date Value Ref Range Status  12/28/2021 4.0 3.5 - 5.3 mmol/L Final  07/25/2014 3.4 (L) 3.5 - 5.1 mmol/L Final         Passed - Patient is not pregnant      Passed - Last BP in normal range    BP Readings from Last 1 Encounters:  02/22/22 (!) 93/53         Passed - Valid encounter within last 6 months    Recent Outpatient Visits           2 months ago Alcoholic cirrhosis of liver with ascites ([place])   [place], DO   4 months ago Alcoholic cirrhosis of liver with ascites [facility])   [facility] Olin Hauser, DO   4 months ago Lymphedema of both lower extremities   [place], DO   8 months ago Annual physical exam   [facility] Olin Hauser, DO   11 months ago Generalized weakness   [place], DO       Future Appointments             In 2 months Lucilla Lame, MD [facility] [place]   In 3 months [place], Devonne Doughty, DO [facility] [place], [facility]            Signed Prescriptions Disp Refills   escitalopram (LEXAPRO)  20 MG tablet 90 tablet 0    Sig: TAKE 1 TABLET EVERY DAY     Psychiatry:  Antidepressants - SSRI Passed - 04/26/2022  3:23 AM      Passed - Completed PHQ-2 or PHQ-9 in the last 360 days      Passed - Valid encounter within last 6 months    Recent Outpatient Visits           2 months ago Alcoholic cirrhosis of liver with ascites [facility])   [place], DO   4 months ago Alcoholic cirrhosis of liver with ascites [facility])   [place], DO   4 months ago Lymphedema of both lower extremities   [place], DO   8 months ago Annual physical exam   [place], DO   11 months ago Generalized weakness   [employer], Devonne Doughty, DO       Future Appointments  In 2 months Midge Minium, MD [place] [facility] [place]   In 3 months Althea Charon, Netta Neat, DO [facility], [facility]

## 2022-04-26 NOTE — Telephone Encounter (Signed)
Requested Prescriptions  Pending Prescriptions Disp Refills  . escitalopram (LEXAPRO) 20 MG tablet [Pharmacy Med Name: ESCITALOPRAM OXALATE 20 MG Tablet] 90 tablet 0    Sig: TAKE 1 TABLET EVERY DAY     Psychiatry:  Antidepressants - SSRI Passed - 04/26/2022  3:23 AM      Passed - Completed PHQ-2 or PHQ-9 in the last 360 days      Passed - Valid encounter within last 6 months    Recent Outpatient Visits          2 months ago Alcoholic cirrhosis of liver with ascites ([place])   [place], DO   4 months ago Alcoholic cirrhosis of liver with ascites [facility])   [facility], Devonne Doughty, DO   4 months ago Lymphedema of both lower extremities   [place], DO   8 months ago Annual physical exam   [facility] Olin Hauser, DO   11 months ago Generalized weakness   [place], DO      Future Appointments            In 2 months Lucilla Lame, MD [facility] [place]   In 3 months [place], Devonne Doughty, DO [facility], [place]           . losartan (COZAAR) 100 MG tablet [Pharmacy Med Name: LOSARTAN POTASSIUM 100 MG Tablet] 90 tablet     Sig: TAKE 1 TABLET EVERY DAY     Cardiovascular:  Angiotensin Receptor Blockers Passed - 04/26/2022  3:23 AM      Passed - Cr in normal range and within 180 days    Creat  Date Value Ref Range Status  12/28/2021 0.72 0.70 - 1.22 mg/dL Final         Passed - K in normal range and within 180 days    Potassium  Date Value Ref Range Status  12/28/2021 4.0 3.5 - 5.3 mmol/L Final  07/25/2014 3.4 (L) 3.5 - 5.1 mmol/L Final         Passed - Patient is not pregnant      Passed - Last BP in normal range    BP Readings from Last 1 Encounters:  02/22/22 (!) 93/53         Passed - Valid encounter within last 6 months    Recent Outpatient Visits          2 months  ago Alcoholic cirrhosis of liver with ascites [facility])   [place], DO   4 months ago Alcoholic cirrhosis of liver with ascites [facility])   [facility] Olin Hauser, DO   4 months ago Lymphedema of both lower extremities   [place], DO   8 months ago Annual physical exam   [place], DO   11 months ago Generalized weakness   [place], Devonne Doughty, DO      Future Appointments            In 2 months Lucilla Lame, MD [place]   In 3 months [place], Devonne Doughty, DO [facility], [facility]

## 2022-05-09 ENCOUNTER — Other Ambulatory Visit: Payer: Self-pay | Admitting: Family Medicine

## 2022-05-09 DIAGNOSIS — K5900 Constipation, unspecified: Secondary | ICD-10-CM

## 2022-05-09 DIAGNOSIS — K7031 Alcoholic cirrhosis of liver with ascites: Secondary | ICD-10-CM

## 2022-05-09 NOTE — Telephone Encounter (Signed)
Requested Prescriptions  Pending Prescriptions Disp Refills  . lactulose (CHRONULAC) 10 GM/15ML solution [Pharmacy Med Name: LACTULOSE 10 GM/15 ML SOLUTION]  1    Sig: TAKE 15-30 MLS BY MOUTH DAILY AS NEEDED FOR MILD CONSTIPATION OR MODERATE CONSTIPATION.     Gastroenterology:  Laxatives - lactulose Failed - 05/09/2022 12:32 PM      Failed - CO2 in normal range and within 360 days    CO2  Date Value Ref Range Status  12/28/2021 33 (H) 20 - 32 mmol/L Final   Co2  Date Value Ref Range Status  07/25/2014 24 21 - 32 mmol/L Final         Passed - Cl in normal range and within 360 days    Chloride  Date Value Ref Range Status  12/28/2021 100 98 - 110 mmol/L Final  07/25/2014 109 (H) 98 - 107 mmol/L Final         Passed - K in normal range and within 360 days    Potassium  Date Value Ref Range Status  12/28/2021 4.0 3.5 - 5.3 mmol/L Final  07/25/2014 3.4 (L) 3.5 - 5.1 mmol/L Final         Passed - Na in normal range and within 360 days    Sodium  Date Value Ref Range Status  12/28/2021 141 135 - 146 mmol/L Final  06/24/2019 146 (H) 134 - 144 mmol/L Final  07/25/2014 141 136 - 145 mmol/L Final         Passed - Valid encounter within last 12 months    Recent Outpatient Visits          2 months ago Alcoholic cirrhosis of liver with ascites [facility])   [place], DO   4 months ago Alcoholic cirrhosis of liver with ascites [facility])   [place], DO   5 months ago Lymphedema of both lower extremities   [facility] [place], Devonne Doughty, DO   9 months ago Annual physical exam   [place], DO   1 year ago Generalized weakness   [place], Devonne Doughty, DO      Future Appointments            In 1 month Lucilla Lame, MD [place]   In 3 months [place], Devonne Doughty, DO [facility],  [facility]

## 2022-05-22 ENCOUNTER — Encounter (INDEPENDENT_AMBULATORY_CARE_PROVIDER_SITE_OTHER): Payer: Self-pay

## 2022-05-22 DIAGNOSIS — H2512 Age-related nuclear cataract, left eye: Secondary | ICD-10-CM | POA: Diagnosis not present

## 2022-05-25 ENCOUNTER — Ambulatory Visit (INDEPENDENT_AMBULATORY_CARE_PROVIDER_SITE_OTHER): Payer: Medicare PPO | Admitting: Nurse Practitioner

## 2022-05-29 DIAGNOSIS — H269 Unspecified cataract: Secondary | ICD-10-CM | POA: Diagnosis not present

## 2022-05-29 DIAGNOSIS — H2511 Age-related nuclear cataract, right eye: Secondary | ICD-10-CM | POA: Diagnosis not present

## 2022-06-05 DIAGNOSIS — H2512 Age-related nuclear cataract, left eye: Secondary | ICD-10-CM | POA: Diagnosis not present

## 2022-06-05 DIAGNOSIS — H269 Unspecified cataract: Secondary | ICD-10-CM | POA: Diagnosis not present

## 2022-06-10 ENCOUNTER — Other Ambulatory Visit: Payer: Self-pay | Admitting: Family Medicine

## 2022-06-10 DIAGNOSIS — K7031 Alcoholic cirrhosis of liver with ascites: Secondary | ICD-10-CM

## 2022-06-10 DIAGNOSIS — K5900 Constipation, unspecified: Secondary | ICD-10-CM

## 2022-06-12 NOTE — Telephone Encounter (Signed)
Requested medication (s) are due for refill today: routing for review  Requested medication (s) are on the active medication list: yes  Last refill:  04/19/22  Future visit scheduled: yes  Notes to clinic:   REQUEST FOR 90 DAYS PRESCRIPTION. DX Code Needed.      Requested Prescriptions  Pending Prescriptions Disp Refills   lactulose (CHRONULAC) 10 GM/15ML solution [Pharmacy Med Name: LACTULOSE 10 GM/15 ML SOLUTION] 2,838 mL 1    Sig: TAKE 15-30 MLS BY MOUTH DAILY AS NEEDED FOR MILD CONSTIPATION OR MODERATE CONSTIPATION.     Gastroenterology:  Laxatives - lactulose Failed - 06/10/2022 10:32 AM      Failed - CO2 in normal range and within 360 days    CO2  Date Value Ref Range Status  12/28/2021 33 (H) 20 - 32 mmol/L Final   Co2  Date Value Ref Range Status  07/25/2014 24 21 - 32 mmol/L Final         Passed - Cl in normal range and within 360 days    Chloride  Date Value Ref Range Status  12/28/2021 100 98 - 110 mmol/L Final  07/25/2014 109 (H) 98 - 107 mmol/L Final         Passed - K in normal range and within 360 days    Potassium  Date Value Ref Range Status  12/28/2021 4.0 3.5 - 5.3 mmol/L Final  07/25/2014 3.4 (L) 3.5 - 5.1 mmol/L Final         Passed - Na in normal range and within 360 days    Sodium  Date Value Ref Range Status  12/28/2021 141 135 - 146 mmol/L Final  06/24/2019 146 (H) 134 - 144 mmol/L Final  07/25/2014 141 136 - 145 mmol/L Final         Passed - Valid encounter within last 12 months    Recent Outpatient Visits           3 months ago Alcoholic cirrhosis of liver with ascites [facility])   [facility], Netta Neat, DO   5 months ago Alcoholic cirrhosis of liver with ascites [facility])   [facility] Smitty Cords, DO   6 months ago Lymphedema of both lower extremities   [facility] [place], Netta Neat, DO   10 months ago Annual physical exam   [facility]  Smitty Cords, DO   1 year ago Generalized weakness   [facility] [place], Netta Neat, DO       Future Appointments             In 3 weeks Midge Minium, MD [place] [facility] [place]   In 2 months Althea Charon, Netta Neat, DO [facility], [facility]

## 2022-06-13 ENCOUNTER — Other Ambulatory Visit: Payer: Self-pay | Admitting: Family Medicine

## 2022-06-13 DIAGNOSIS — I890 Lymphedema, not elsewhere classified: Secondary | ICD-10-CM

## 2022-06-13 NOTE — Telephone Encounter (Signed)
Requested medication (s) are due for refill today: yes  Requested medication (s) are on the active medication list: yes  Last refill:  12/18/21 #90  Future visit scheduled: yes  Notes to clinic:  prescriber was at hospital d/c- pt no longer under prescriber's care   Requested Prescriptions  Pending Prescriptions Disp Refills   furosemide (LASIX) 40 MG tablet [Pharmacy Med Name: FUROSEMIDE 40 MG TABLET] 90 tablet 0    Sig: Take 1 tablet (40 mg total) by mouth daily as needed for fluid or edema.     Cardiovascular:  Diuretics - Loop Passed - 06/13/2022  1:58 AM      Passed - K in normal range and within 180 days    Potassium  Date Value Ref Range Status  12/28/2021 4.0 3.5 - 5.3 mmol/L Final  07/25/2014 3.4 (L) 3.5 - 5.1 mmol/L Final         Passed - Ca in normal range and within 180 days    Calcium  Date Value Ref Range Status  12/28/2021 9.6 8.6 - 10.3 mg/dL Final   Calcium, Total  Date Value Ref Range Status  07/25/2014 9.4 8.5 - 10.1 mg/dL Final         Passed - Na in normal range and within 180 days    Sodium  Date Value Ref Range Status  12/28/2021 141 135 - 146 mmol/L Final  06/24/2019 146 (H) 134 - 144 mmol/L Final  07/25/2014 141 136 - 145 mmol/L Final         Passed - Cr in normal range and within 180 days    Creat  Date Value Ref Range Status  12/28/2021 0.72 0.70 - 1.22 mg/dL Final         Passed - Cl in normal range and within 180 days    Chloride  Date Value Ref Range Status  12/28/2021 100 98 - 110 mmol/L Final  07/25/2014 109 (H) 98 - 107 mmol/L Final         Passed - Mg Level in normal range and within 180 days    Magnesium  Date Value Ref Range Status  12/18/2021 1.7 1.7 - 2.4 mg/dL Final    Comment:    Performed at [facility], [address] [place], [place] [address]         Passed - Last BP in normal range    BP Readings from Last 1 Encounters:  02/22/22 (!) 93/53         Passed - Valid encounter within last 6 months     Recent Outpatient Visits           3 months ago Alcoholic cirrhosis of liver with ascites [facility])   [facility] Smitty Cords, DO   5 months ago Alcoholic cirrhosis of liver with ascites [facility])   [facility] Smitty Cords, DO   6 months ago Lymphedema of both lower extremities   [facility], Netta Neat, DO   10 months ago Annual physical exam   [facility] Smitty Cords, DO   1 year ago Generalized weakness   [facility] [place], Netta Neat, DO       Future Appointments             In 2 weeks Midge Minium, MD [facility] [place]   In 2 months Althea Charon, Netta Neat, DO [facility], [facility]

## 2022-06-21 ENCOUNTER — Emergency Department: Payer: Medicare PPO

## 2022-06-21 ENCOUNTER — Encounter: Payer: Self-pay | Admitting: *Deleted

## 2022-06-21 ENCOUNTER — Other Ambulatory Visit: Payer: Self-pay

## 2022-06-21 ENCOUNTER — Telehealth: Payer: Self-pay

## 2022-06-21 ENCOUNTER — Emergency Department
Admission: EM | Admit: 2022-06-21 | Discharge: 2022-06-21 | Disposition: A | Discharge status: Home / Self Care | Payer: Medicare PPO | Attending: Emergency Medicine | Admitting: Emergency Medicine

## 2022-06-21 DIAGNOSIS — K7031 Alcoholic cirrhosis of liver with ascites: Secondary | ICD-10-CM | POA: Diagnosis not present

## 2022-06-21 DIAGNOSIS — I10 Essential (primary) hypertension: Secondary | ICD-10-CM | POA: Diagnosis not present

## 2022-06-21 DIAGNOSIS — Z7901 Long term (current) use of anticoagulants: Secondary | ICD-10-CM | POA: Insufficient documentation

## 2022-06-21 DIAGNOSIS — N62 Hypertrophy of breast: Secondary | ICD-10-CM | POA: Diagnosis not present

## 2022-06-21 DIAGNOSIS — M19011 Primary osteoarthritis, right shoulder: Secondary | ICD-10-CM | POA: Diagnosis not present

## 2022-06-21 DIAGNOSIS — R222 Localized swelling, mass and lump, trunk: Secondary | ICD-10-CM | POA: Diagnosis not present

## 2022-06-21 DIAGNOSIS — R600 Localized edema: Secondary | ICD-10-CM | POA: Diagnosis present

## 2022-06-21 DIAGNOSIS — M19012 Primary osteoarthritis, left shoulder: Secondary | ICD-10-CM | POA: Diagnosis not present

## 2022-06-21 LAB — BASIC METABOLIC PANEL
Anion gap: 7 (ref 5–15)
BUN: 14 mg/dL (ref 8–23)
CO2: 29 mmol/L (ref 22–32)
Calcium: 9.4 mg/dL (ref 8.9–10.3)
Chloride: 104 mmol/L (ref 98–111)
Creatinine, Ser: 0.81 mg/dL (ref 0.61–1.24)
GFR, Estimated: >60 mL/min (ref >60)
Glucose, Bld: 110 mg/dL — ABNORMAL HIGH (ref 70–99)
Potassium: 4.6 mmol/L (ref 3.5–5.1)
Sodium: 140 mmol/L (ref 135–145)

## 2022-06-21 LAB — LACTIC ACID, PLASMA: Lactic Acid, Venous: 1.2 mmol/L (ref 0.5–1.9)

## 2022-06-21 LAB — HEPATIC FUNCTION PANEL
ALT: 8 U/L (ref 0–44)
AST: 16 U/L (ref 15–41)
Albumin: 3.6 g/dL (ref 3.5–5.0)
Alkaline Phosphatase: 64 U/L (ref 38–126)
Bilirubin, Direct: 0.2 mg/dL (ref 0.0–0.2)
Indirect Bilirubin: 1 mg/dL — ABNORMAL HIGH (ref 0.3–0.9)
Total Bilirubin: 1.2 mg/dL (ref 0.3–1.2)
Total Protein: 7.5 g/dL (ref 6.5–8.1)

## 2022-06-21 LAB — PROTIME-INR
INR: 1.2 (ref 0.8–1.2)
Prothrombin Time: 15 seconds (ref 11.4–15.2)

## 2022-06-21 LAB — TROPONIN I (HIGH SENSITIVITY)
Troponin I (High Sensitivity): 6 ng/L (ref <18)
Troponin I (High Sensitivity): 8 ng/L (ref <18)

## 2022-06-21 LAB — CBC
HCT: 42.7 % (ref 39.0–52.0)
Hemoglobin: 13.8 g/dL (ref 13.0–17.0)
MCH: 34.1 pg — ABNORMAL HIGH (ref 26.0–34.0)
MCHC: 32.3 g/dL (ref 30.0–36.0)
MCV: 105.4 fL — ABNORMAL HIGH (ref 80.0–100.0)
Platelets: 241 10*3/uL (ref 150–400)
RBC: 4.05 MIL/uL — ABNORMAL LOW (ref 4.22–5.81)
RDW: 14.4 % (ref 11.5–15.5)
WBC: 4.9 10*3/uL (ref 4.0–10.5)
nRBC: 0 % (ref 0.0–0.2)

## 2022-06-21 LAB — AMMONIA: Ammonia: 29 umol/L (ref 9–35)

## 2022-06-21 NOTE — Patient Instructions (Signed)
Visit Information  Thank you for taking time to visit with me today. Please don't hesitate to contact me if I can be of assistance to you before our next scheduled telephone appointment.  Following are the goals we discussed today:  Wait for call from GI regarding further instructions due to inability to void and ascitics.  Our next appointment is pending discharge from ED/hospital  Please call the care guide team at [phone] if you need to cancel or reschedule your appointment.   Please call the Suicide and Crisis Lifeline: 988 call the [place] National Suicide Prevention Lifeline: 250 143 0711 or TTY: (208) 846-5498 TTY 564-173-4251) to talk to a trained counselor call 1-800-273-TALK (toll free, 24 hour hotline) call 911 if you are experiencing a Mental Health or Behavioral Health Crisis or need someone to talk to.  Patient verbalizes understanding of instructions and care plan provided today and agrees to view in MyChart. Active MyChart status and patient understanding of how to access instructions and care plan via MyChart confirmed with patient.     The patient has been provided with contact information for the care management team and has been advised to call with any health related questions or concerns.   Kemper Durie, RN, MSN, [facility] Care Management Care Management Coordinator [phone]

## 2022-06-21 NOTE — Telephone Encounter (Signed)
pt has Hx of cirrhosis w/ ascites... Pt is currently having difficulty urinating and is swollen and uncomfortable... Pt has a f/u appt with you in 2 weeks.... Please advise if okay to order paracentesis   Yes. Except that he is not urinating. That may mean he is already dry and paracentesis is bad. So if not urinating he should go to the er.

## 2022-06-21 NOTE — ED Provider Notes (Signed)
[facility] Provider Note    Event Date/Time   First MD Initiated Contact with Patient 06/21/22 2149     (approximate)   History   Chief Complaint Edema   HPI  Craig [name] is a 81 y.o. male with past medical history of hypertension, hyperlipidemia, DVT/PE, atrial fibrillation on Xarelto, lymphedema, and alcoholic cirrhosis who presents to the ED complaining of edema.  Patient reports that he has been dealing with gradually worsening swelling in his abdomen over the past 2 months.  He believes he has gained 40 to 50 pounds during this time with the majority of the fluid accumulating in his abdomen.  He has previously dealt with swelling in his legs, but states that the swelling in his legs seems improved today.  He has been taking Lasix for increased swelling, but states he does not seem to urinate much when he takes this medication.  He denies any pain in his abdomen and has been eating and drinking normally, denies any fevers.  He states that between doses of Lasix he is urinating without difficulty, denies any dysuria.     Physical Exam   Triage Vital Signs: ED Triage Vitals  Enc Vitals Group     BP 06/21/22 1615 116/71     Pulse Rate 06/21/22 1615 (!) 50     Resp 06/21/22 1615 20     Temp 06/21/22 1615 98.1 F (36.7 C)     Temp Source 06/21/22 1615 Oral     SpO2 06/21/22 1615 96 %     Weight 06/21/22 1616 243 lb (110.2 kg)     Height 06/21/22 1616 6\' 3"  (1.905 m)     Head Circumference --      Peak Flow --      Pain Score 06/21/22 1615 0     Pain Loc --      Pain Edu? --      Excl. in GC? --     Most recent vital signs: Vitals:   06/21/22 2155 06/21/22 2200  BP:  132/84  Pulse: 64 70  Resp: 19 13  Temp:    SpO2: 93% 98%    Constitutional: Alert and oriented. Eyes: Conjunctivae are normal. Head: Atraumatic. Nose: No congestion/rhinnorhea. Mouth/Throat: Mucous membranes are moist.  Cardiovascular: Normal rate, irregularly irregular  rhythm. Grossly normal heart sounds.  2+ radial pulses bilaterally. Respiratory: Normal respiratory effort.  No retractions. Lungs CTAB. Gastrointestinal: Soft and nontender.  Mildly distended with positive fluid wave. Musculoskeletal: No lower extremity tenderness nor edema.  Neurologic:  Normal speech and language. No gross focal neurologic deficits are appreciated.    ED Results / Procedures / Treatments   Labs (all labs ordered are listed, but only abnormal results are displayed) Labs Reviewed  BASIC METABOLIC PANEL - Abnormal; Notable for the following components:      Result Value   Glucose, Bld 110 (*)    All other components within normal limits  CBC - Abnormal; Notable for the following components:   RBC 4.05 (*)    MCV 105.4 (*)    MCH 34.1 (*)    All other components within normal limits  HEPATIC FUNCTION PANEL - Abnormal; Notable for the following components:   Indirect Bilirubin 1.0 (*)    All other components within normal limits  PROTIME-INR  AMMONIA  LACTIC ACID, PLASMA  TROPONIN I (HIGH SENSITIVITY)  TROPONIN I (HIGH SENSITIVITY)   RADIOLOGY Chest x-ray reviewed and interpreted by me with no infiltrate,  edema, or effusion.  PROCEDURES:  Critical Care performed: No  Procedures   MEDICATIONS ORDERED IN ED: Medications - No data to display   IMPRESSION / MDM / ASSESSMENT AND PLAN / ED COURSE  I reviewed the triage vital signs and the nursing notes.                              81 y.o. male with past medical history of hypertension, hyperlipidemia, atrial fibrillation on Xarelto, DVT/PE, and cirrhosis who presents to the ED complaining of increasing swelling and distention in his abdomen with weight gain over the past 2 months.  Patient's presentation is most consistent with acute presentation with potential threat to life or bodily function.  Differential diagnosis includes, but is not limited to, ascites, SBP, electrolyte abnormality, hepatic  failure, hepatorenal syndrome.  Patient nontoxic-appearing and in no acute distress, vital signs are unremarkable.  He has a distended abdomen with ascites but no tenderness noted.  Minimal swelling noted in his bilateral lower extremities.  Labs are reassuring with no significant anemia, leukocytosis, electrolyte abnormality, or AKI.  LFTs are also unremarkable, INR within normal limits.  Patient would benefit from therapeutic paracentesis but no concern for SBP at this time.  Do not feel paracentesis needs to be performed emergently here in the ED and case discussed with patient's GI doctor, Dr. Servando Snare.  He states that he will be able to arrange for patient to have outpatient paracentesis in the next few days if he calls the office in the morning.  Patient is appropriate for discharge home and was counseled to call the GI office in the morning, otherwise return to the ED for new or worsening symptoms.  Patient and family agree with plan.      FINAL CLINICAL IMPRESSION(S) / ED DIAGNOSES   Final diagnoses:  Ascites due to alcoholic cirrhosis (HCC)  Gynecomastia     Rx / DC Orders   ED Discharge Orders     None        Note:  This document was prepared using Dragon voice recognition software and may include unintentional dictation errors.   Chesley Noon, MD 06/21/22 2311

## 2022-06-21 NOTE — ED Triage Notes (Signed)
Pt reports swelling in abdomen.  Hx cirrhosis. Pt reports 50 pound weight gain over 2 months. No sob.  No chest pain.  Pt alert  speech clear.

## 2022-06-21 NOTE — Patient Outreach (Signed)
  Care Coordination   Follow Up Visit Note   06/21/2022 Name: Craig [name] MRN: [id number] DOB: 08-24-1940  Craig [name] is a 81 y.o. year old male who sees Craig Cords, DO for primary care. I spoke with Craig [name], significant other of   Craig [name] by phone today.  What matters to the patients health and wellness today?  Patient has gained 50 pounds since last hospitalization in  July when he had fluid pulled from his abdomen.  Per Craig [name], fluid is all in belly.   Goals Addressed             This Visit's Progress    RNCM: Effective Management of Cirrhosis   Not on track    Care Coordination Interventions: Evaluation of current treatment plan related to cirrhosis of the  and patient's adherence to plan as established by provider. Craig [name] has been keeping a record of the patients weight. He was 219 when he came home from the hospital, went down to 190 and is back up to 240. Advised patient to monitor for weight gain, shortness of breath, changes in memory, and other sx and sx of worsening conditions associated with cirrhosis of the liver. Review of worsening sx and sx of condition and review of how to respond if the patient  has acute onset of weight gain and shortness of breath.  Provided education regarding ascities Reviewed medications with patient and discussed compliance. Craig [name], the patients significant other states the patient is taking Inspra, has not wanted to take the Lasix the last several days as he feels this has not helped.  Collaborated with Craig [name] provider office (message left for Craig [name]) regarding the concerns Craig [name] has with the patients weight and now inability to void.  He is in pain, although currently sleeping. Education and support given. Will let Craig [name] know the recommendations from the pcp.  Reviewed scheduled/upcoming provider appointments including appointments with the Craig [name] specialist on 07-03-2022 and pcp in January. Education given that the patient  may need to be seen sooner for further evaluation and treatment option due to increasing weight and changes that are occurring. Reminder given that the patient may need to be seen sooner due to weight changes. Will see what the recommendations of the Craig [name] provider are.  Discussed plans with patient for ongoing care management follow up and provided patient with direct contact information for care management team Advised patient to discuss  liver transplant, other treatment options for effective management of cirrhosis  with primary care provider. Craig [name] states that Dr. Servando [name] expressed the patient would not be a candidate for liver transplantation.              SDOH assessments and interventions completed:  No     Care Coordination Interventions:  Yes, provided   Follow up plan: Follow up call scheduled for pending discharge from hospital as he was instructed by Craig [name] to go to ED    Encounter Outcome:  Pt. Visit Completed   Craig Durie, RN, MSN, [facility] Care Management Care Management Coordinator [phone]

## 2022-06-21 NOTE — Telephone Encounter (Signed)
I spoke to [facility] per DPR and Pt... Pt is agreeable to going to ER and states he will go today... Meriam Sprague reports that pt has gained 30-40 pounds.Marland KitchenMarland KitchenMarland Kitchen

## 2022-06-22 ENCOUNTER — Other Ambulatory Visit: Payer: Self-pay

## 2022-06-22 ENCOUNTER — Encounter: Payer: Self-pay | Admitting: Gastroenterology

## 2022-06-22 DIAGNOSIS — K7031 Alcoholic cirrhosis of liver with ascites: Secondary | ICD-10-CM

## 2022-06-23 ENCOUNTER — Other Ambulatory Visit: Payer: Self-pay | Admitting: Gastroenterology

## 2022-06-23 ENCOUNTER — Ambulatory Visit
Admission: RE | Admit: 2022-06-23 | Discharge: 2022-06-23 | Disposition: A | Discharge status: Home / Self Care | Payer: Medicare PPO | Source: Ambulatory Visit | Attending: Gastroenterology | Admitting: Gastroenterology

## 2022-06-23 DIAGNOSIS — K7031 Alcoholic cirrhosis of liver with ascites: Secondary | ICD-10-CM

## 2022-06-23 DIAGNOSIS — R188 Other ascites: Secondary | ICD-10-CM | POA: Diagnosis not present

## 2022-06-23 NOTE — Progress Notes (Signed)
Patient presented to the [facility] Ultrasound department for possible paracentesis. Limited ultrasound examination of the abdomen revealed no ascites. No procedure performed. Images saved in Epic. Dr. Juliette Alcide made aware.  Alwyn Ren, [place] [phone] 06/23/2022, 3:19 PM

## 2022-06-26 ENCOUNTER — Telehealth: Payer: Self-pay | Admitting: *Deleted

## 2022-06-26 NOTE — Telephone Encounter (Signed)
     Patient  visit on 06/21/2022  at [facility] was for ascites  Have you been able to follow up with your primary care physician? Pcp appt has been completed and will see Dr wall next week  The patient was able to obtain any needed medicine or equipment.  Are there diet recommendations that you are having difficulty following?  Patient expresses understanding of discharge instructions and education provided has no other needs at this time.    Yehuda Mao Greenauer -[facility], Population Health [phone] [address] [place] , [place] [address] Email : [email or web address]

## 2022-06-28 ENCOUNTER — Encounter: Payer: Self-pay | Admitting: Gastroenterology

## 2022-07-03 ENCOUNTER — Ambulatory Visit: Payer: Medicare PPO | Admitting: Gastroenterology

## 2022-07-03 ENCOUNTER — Encounter: Payer: Self-pay | Admitting: Gastroenterology

## 2022-07-03 VITALS — BP 131/74 | HR 65 | Temp 98.1°F | Wt 242.0 lb

## 2022-07-03 DIAGNOSIS — K7031 Alcoholic cirrhosis of liver with ascites: Secondary | ICD-10-CM | POA: Diagnosis not present

## 2022-07-03 NOTE — Progress Notes (Signed)
Primary Care Physician: Smitty Cords, DO  Primary Gastroenterologist:  Dr. Midge Minium  Chief Complaint  Patient presents with   Follow-up    Pt reports that when he takes the Lasix, he has the urge to go but cannot go    HPI: Craig [name] is a 81 y.o. male here with a history of alcoholic liver disease.  The patient had a recent ultrasound that did not show any ascites.  His most recent alpha-fetoprotein was in June and was less than 1.8.  The patient was in the emergency department at the end of November with edema and abdominal swelling. The patient's wife states that he has constipated and did not like taking the lactulose but he has been on MiraLAX.  The patient states he is moving his bowels every day now.  He also reports very painful breasts did not like taking the MiraLAX but has been on the an enlargement of his breast.  He is no longer taking Lasix because he reports it gave him a sense of urgency without results.  He is still taking the Aldactone.  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(HCC)     Current Outpatient Medications  Medication Sig Dispense Refill   atorvastatin (LIPITOR) 40 MG tablet TAKE 1 TABLET (40 MG TOTAL) BY MOUTH DAILY. 90 tablet 3   cholecalciferol (VITAMIN D) 1000 UNITS tablet Take 1,000 Units by mouth daily.     eplerenone (INSPRA) 50 MG tablet Take 1 tablet (50 mg total) by mouth daily. 90 tablet 3   escitalopram (LEXAPRO) 20 MG tablet TAKE 1 TABLET EVERY DAY 90 tablet 0   fluticasone (FLONASE) 50 MCG/ACT nasal spray Place 2 sprays into both nostrils daily. Use for 4-6 weeks then stop and use seasonally or as needed. 16 g 3   furosemide (LASIX) 40 MG tablet TAKE 1 TABLET (40 MG TOTAL) BY MOUTH DAILY AS NEEDED FOR FLUID OR EDEMA. 90 tablet 3   lactulose (CHRONULAC) 10 GM/15ML solution TAKE 15-30 MLS BY MOUTH DAILY AS NEEDED FOR MILD  CONSTIPATION OR MODERATE CONSTIPATION. 2838 mL 1   Melatonin 5 MG TABS Take 10 mg by mouth at bedtime. 3 tabs     metoprolol succinate (TOPROL-XL) 25 MG 24 hr tablet TAKE 1 TABLET (25 MG TOTAL) BY MOUTH DAILY. 90 tablet 3   omeprazole (PRILOSEC) 40 MG capsule TAKE 1 CAPSULE EVERY DAY 90 capsule 3   vitamin B-12 (CYANOCOBALAMIN) 500 MCG tablet Take 500 mcg by mouth daily.     vitamin E 1000 UNIT capsule Take 1,000 Units by mouth daily.     XARELTO 20 MG TABS tablet TAKE 1 TABLET (20 MG TOTAL) BY MOUTH DAILY WITH SUPPER. 90 tablet 3   No current facility-administered medications for this visit.    Allergies as of 07/03/2022 - Review Complete 07/03/2022  Allergen Reaction Noted   Trazodone and nefazodone Itching and Swelling 05/08/2017    ROS:  General: Negative for anorexia, weight loss, fever, chills, fatigue, weakness. ENT: Negative for hoarseness, difficulty swallowing , nasal congestion. CV: Negative for chest pain, angina, palpitations, dyspnea on exertion, peripheral edema.  Respiratory: Negative for dyspnea at rest, dyspnea on exertion, cough, sputum, wheezing.  GI: See history of present illness. GU:  Negative for dysuria, hematuria, urinary incontinence, urinary frequency, nocturnal urination.  Endo: Negative for unusual weight change.  Physical Examination:   BP 131/74   Pulse 65   Temp 98.1 F (36.7 C) (Oral)   Wt 242 lb (109.8 kg)   BMI 30.25 kg/m   General: Well-nourished, well-developed in no acute distress.  Eyes: No icterus. Conjunctivae pink. Extremities: No lower extremity edema. No clubbing or deformities. Neuro: Alert and oriented x 3.  Grossly intact. Skin: Warm and dry, no jaundice.   Psych: Alert and cooperative, normal mood and affect.  Labs:    Imaging Studies: US Abdomen Limited  Result Date: 06/23/2022 CLINICAL DATA:  Ascites EXAM: LIMITED ABDOMEN ULTRASOUND FOR ASCITES TECHNIQUE: Limited ultrasound survey for ascites was performed in all  four abdominal quadrants. COMPARISON:  None Available. FINDINGS: Limited ultrasound assessment of the 4 quadrants of the abdomen was performed. Images demonstrate no ascites. No intervention performed. IMPRESSION: No ascites.  No paracentesis performed. Electronically Signed   By: Albin Felling M.D.   On: 06/23/2022 15:57   DG Chest 2 View  Result Date: 06/21/2022 CLINICAL DATA:  Swelling EXAM: CHEST - 2 VIEW COMPARISON:  CXR 12/17/21 FINDINGS: No pleural effusion. No pneumothorax. Unchanged cardiac and mediastinal contours. No displaced rib fractures. Vertebral body heights are maintained. There are surgical clips in the left upper quadrant. Upper abdomen is otherwise unremarkable. Degenerative changes of the bilateral AC joints. IMPRESSION: No acute cardiopulmonary process. No evidence of pulmonary edema. No pleural effusion. Electronically Signed   By: Marin Roberts M.D.   On: 06/21/2022 16:36    Assessment and Plan:   AAQIB [name] is a 81 y.o. y/o male who comes in today with a history of alcoholic liver disease with cirrhosis.  The patient has completely stopped drinking and his ascites has resolved.  He is only taking Aldactone but is having side effects of gynecomastia.  Since his ascites has diminished to undetectable on just the Aldactone he can try to stop the Aldactone and see if the ascites comes back thereby negating the gynecomastia and painful breasts he has of the present time.  Patient has been encouraged to either titrate the MiraLAX or add back the lactulose with MiraLAX so that he is having to formed bowel movements a day and to try and avoid constipation.  The patient will also be set up for a blood test for alpha-fetoprotein today.  He will also have the alpha-fetoprotein and ultrasound repeated in 6 months.     Craig Lame, MD. Craig [name]    Note: This dictation was prepared with Dragon dictation along with smaller phrase technology. Any transcriptional errors that result from this  process are unintentional.

## 2022-07-05 ENCOUNTER — Encounter: Payer: Self-pay | Admitting: Gastroenterology

## 2022-07-05 LAB — AFP TUMOR MARKER: AFP, Serum, Tumor Marker: <1.8 ng/mL (ref 0.0–8.4)

## 2022-07-11 ENCOUNTER — Telehealth: Payer: Self-pay | Admitting: *Deleted

## 2022-07-11 NOTE — Patient Instructions (Signed)
Visit Information  Thank you for taking time to visit with me today. Please don't hesitate to contact me if I can be of assistance to you before our next scheduled telephone appointment.  Following are the goals we discussed today:  Call GI office to schedule 6 month follow up  Our next appointment is by telephone on 1/26  Please call the care guide team at [phone] if you need to cancel or reschedule your appointment.   Please call the Suicide and Crisis Lifeline: 988 call the [place] National Suicide Prevention Lifeline: 509-232-6732 or TTY: 303-392-8992 TTY [phone]) to talk to a trained counselor call 1-800-273-TALK (toll free, 24 hour hotline) call 911 if you are experiencing a Mental Health or Behavioral Health Crisis or need someone to talk to.  Patient verbalizes understanding of instructions and care plan provided today and agrees to view in MyChart. Active MyChart status and patient understanding of how to access instructions and care plan via MyChart confirmed with patient.     The patient has been provided with contact information for the care management team and has been advised to call with any health related questions or concerns.   Kemper Durie, RN, MSN, [facility] Care Management Care Management Coordinator [phone]

## 2022-07-11 NOTE — Patient Outreach (Signed)
  Care Coordination   Follow Up Visit Note   07/11/2022 Name: Craig [name] MRN: [id number] DOB: 08-Dec-1940  Craig [name] is a 81 y.o. year old male who sees Craig Cords, DO for primary care. I spoke with Craig [name], wife of  Craig [name] by phone today.  What matters to the patients health and wellness today?  Ongoing management of liver disease.  Was seen in ED on 11/29.     Goals Addressed             This Visit's Progress    RNCM: Effective Management of Cirrhosis   On track    Care Coordination Interventions: Evaluation of current treatment plan related to cirrhosis of the  and patient's adherence to plan as established by provider. Craig [name] has been keeping a record of the patients weight, currently at 240 but per MD and recent tests, this is not fluid/ascites related. Advised patient to monitor for weight gain, shortness of breath, changes in memory, and other sx and sx of worsening conditions associated with cirrhosis of the liver. Review of worsening sx and sx of condition and review of how to respond if the patient  has acute onset of weight gain and shortness of breath.  Provided education regarding management of cirrhosis Reviewed medications with patient and discussed compliance. Craig [name], the patients significant other states the patient is taking Inspra, has not wanted to take the Lasix the last several days as he feels this has not helped.  Reviewed recent GI visit with Craig [name], confirmed patient has changed medications per recommendations (taking Lactulose and Mirilax, stopped taking Aldactone due to breast discomfort) Reviewed scheduled/upcoming provider appointments including appointments with the PCP on 1/22.  Noted recommendation for GI follow up in 6 months, Craig [name] will call to schedule Discussed plans with patient for ongoing care management follow up and provided patient with direct contact information for care management team             SDOH  assessments and interventions completed:  No     Care Coordination Interventions:  Yes, provided   Follow up plan: Follow up call scheduled for 1/26    Encounter Outcome:  Pt. Visit Completed   Craig Durie, RN, MSN, Craig [name] Care Management Care Management Coordinator [phone]

## 2022-07-18 IMAGING — US US PARACENTESIS
1 series · 5 of 5 positions shown · non-contrast
Comparison: none

INDICATION: Patient with history of daily ETOH use who fell at home today, CT
shows new onset ascites. Request for diagnostic and therapeutic
paracentesis.

[Series 1: us paracentesis · 5 of 5 slices shown]
[im 1/5]
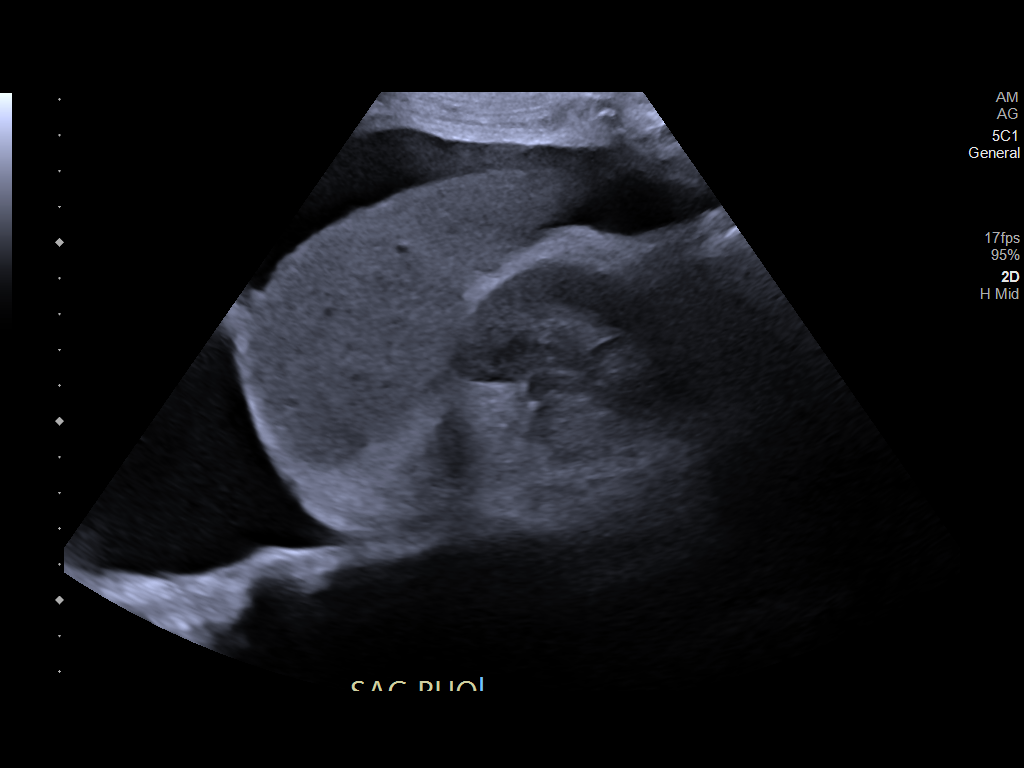
[im 2/5]
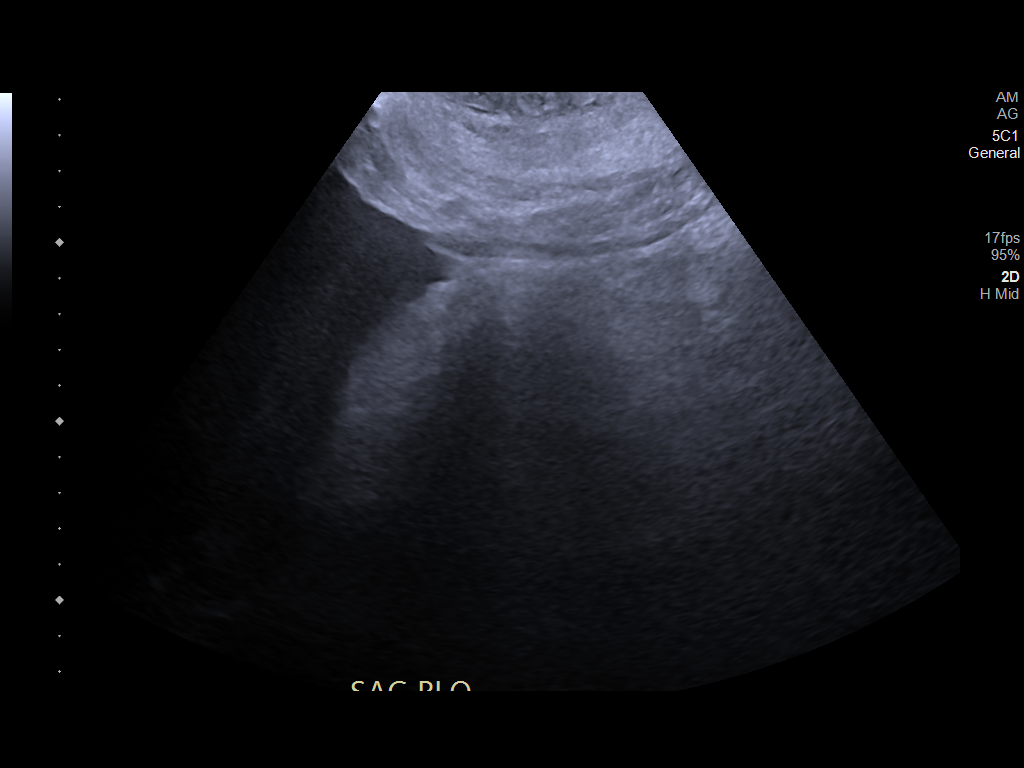
[im 3/5]
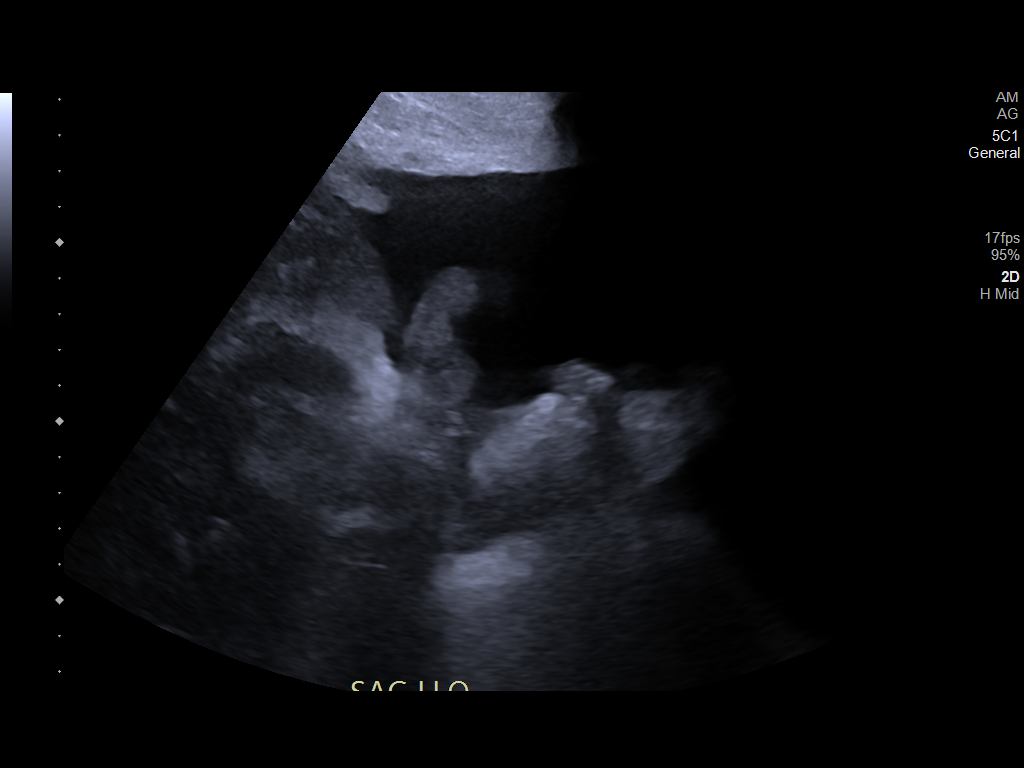
[im 4/5]
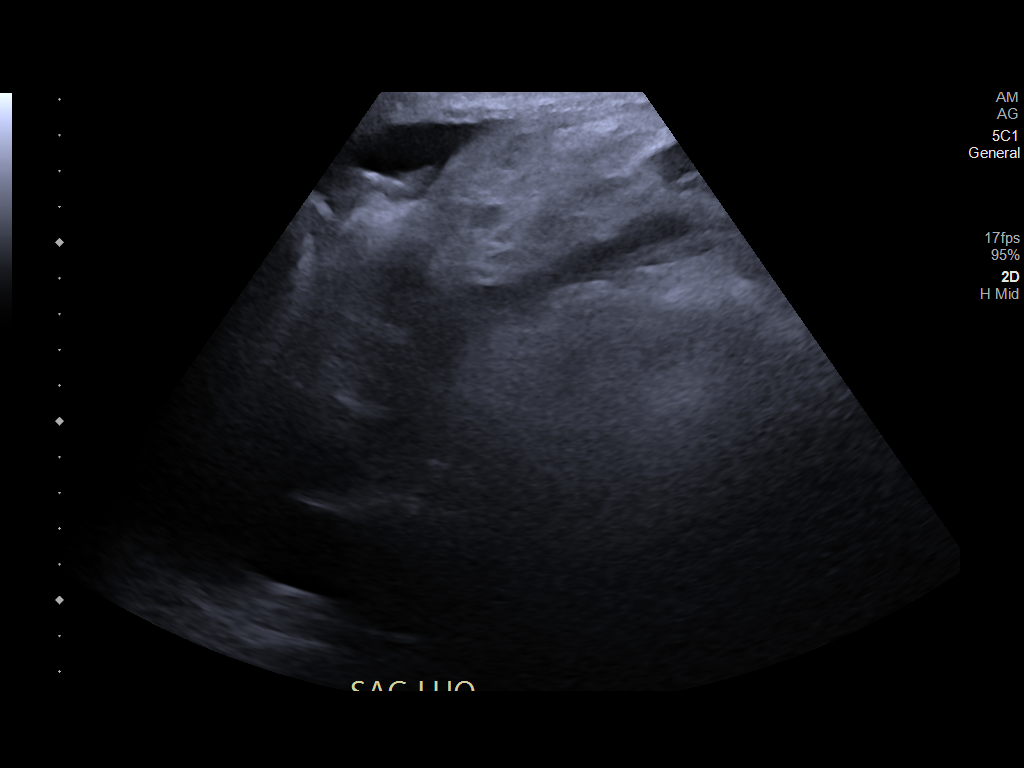
[im 5/5]
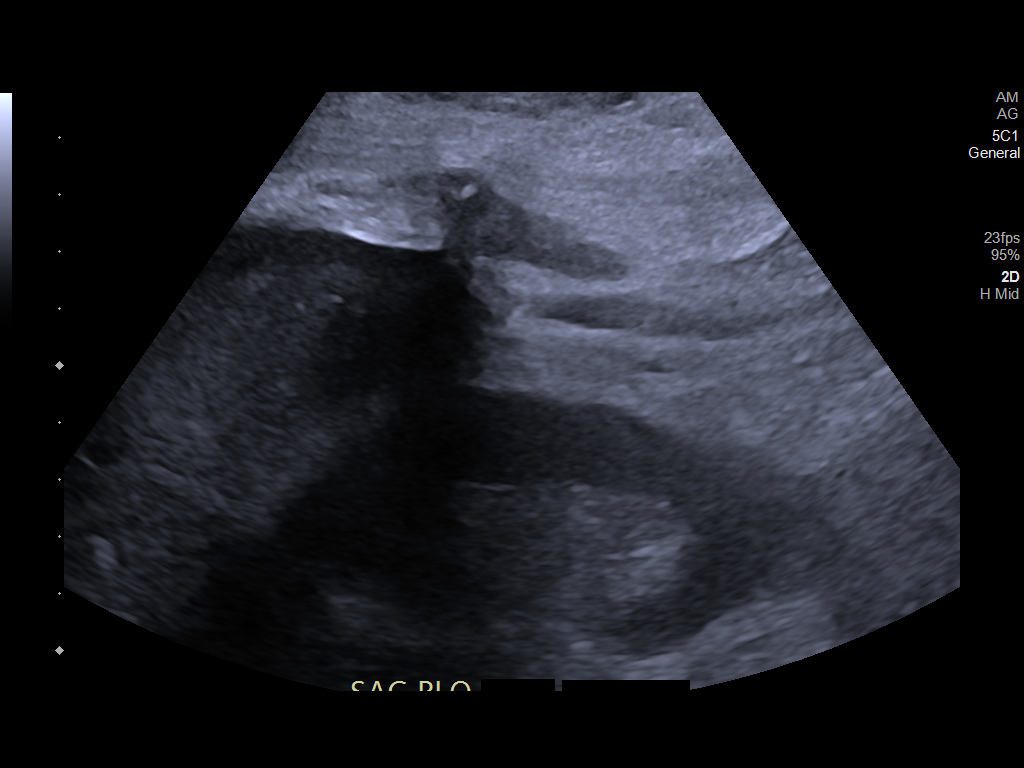

[5 of 5 positions shown; findings below may reference images not displayed]

EXAM:
ULTRASOUND GUIDED DIAGNOSTIC AND THERAPEUTIC PARACENTESIS

MEDICATIONS:
8 mL 1% lidocaine

COMPLICATIONS:
None immediate.

PROCEDURE:
Informed written consent was obtained from the patient after a
discussion of the risks, benefits and alternatives to treatment. A
timeout was performed prior to the initiation of the procedure.

Initial ultrasound scanning demonstrates a large amount of ascites
within the right upper abdominal quadrant. The right upper abdomen
was prepped and draped in the usual sterile fashion. 1% lidocaine
was used for local anesthesia.

Following this, a 6 Fr Safe-T-Centesis catheter was introduced. An
ultrasound image was saved for documentation purposes. The
paracentesis was performed. The catheter was removed and a dressing
was applied. The patient tolerated the procedure well without
immediate post procedural complication.
FINDINGS: A total of approximately 2.8 L of clear yellow fluid was removed.
Samples were sent to the laboratory as requested by the clinical
team.
IMPRESSION: Successful ultrasound-guided paracentesis yielding 2.8 liters of
peritoneal fluid.

Read by Tiger, Abimelk

## 2022-07-19 IMAGING — DX DG CHEST 1V PORT
2 series · 2 of 2 positions shown · non-contrast
Comparison: 12/15/2021 and older exams.

CLINICAL DATA: Status post right thoracentesis.

EXAM:
PORTABLE CHEST 1 VIEW

[chest ap (1 of 2)]
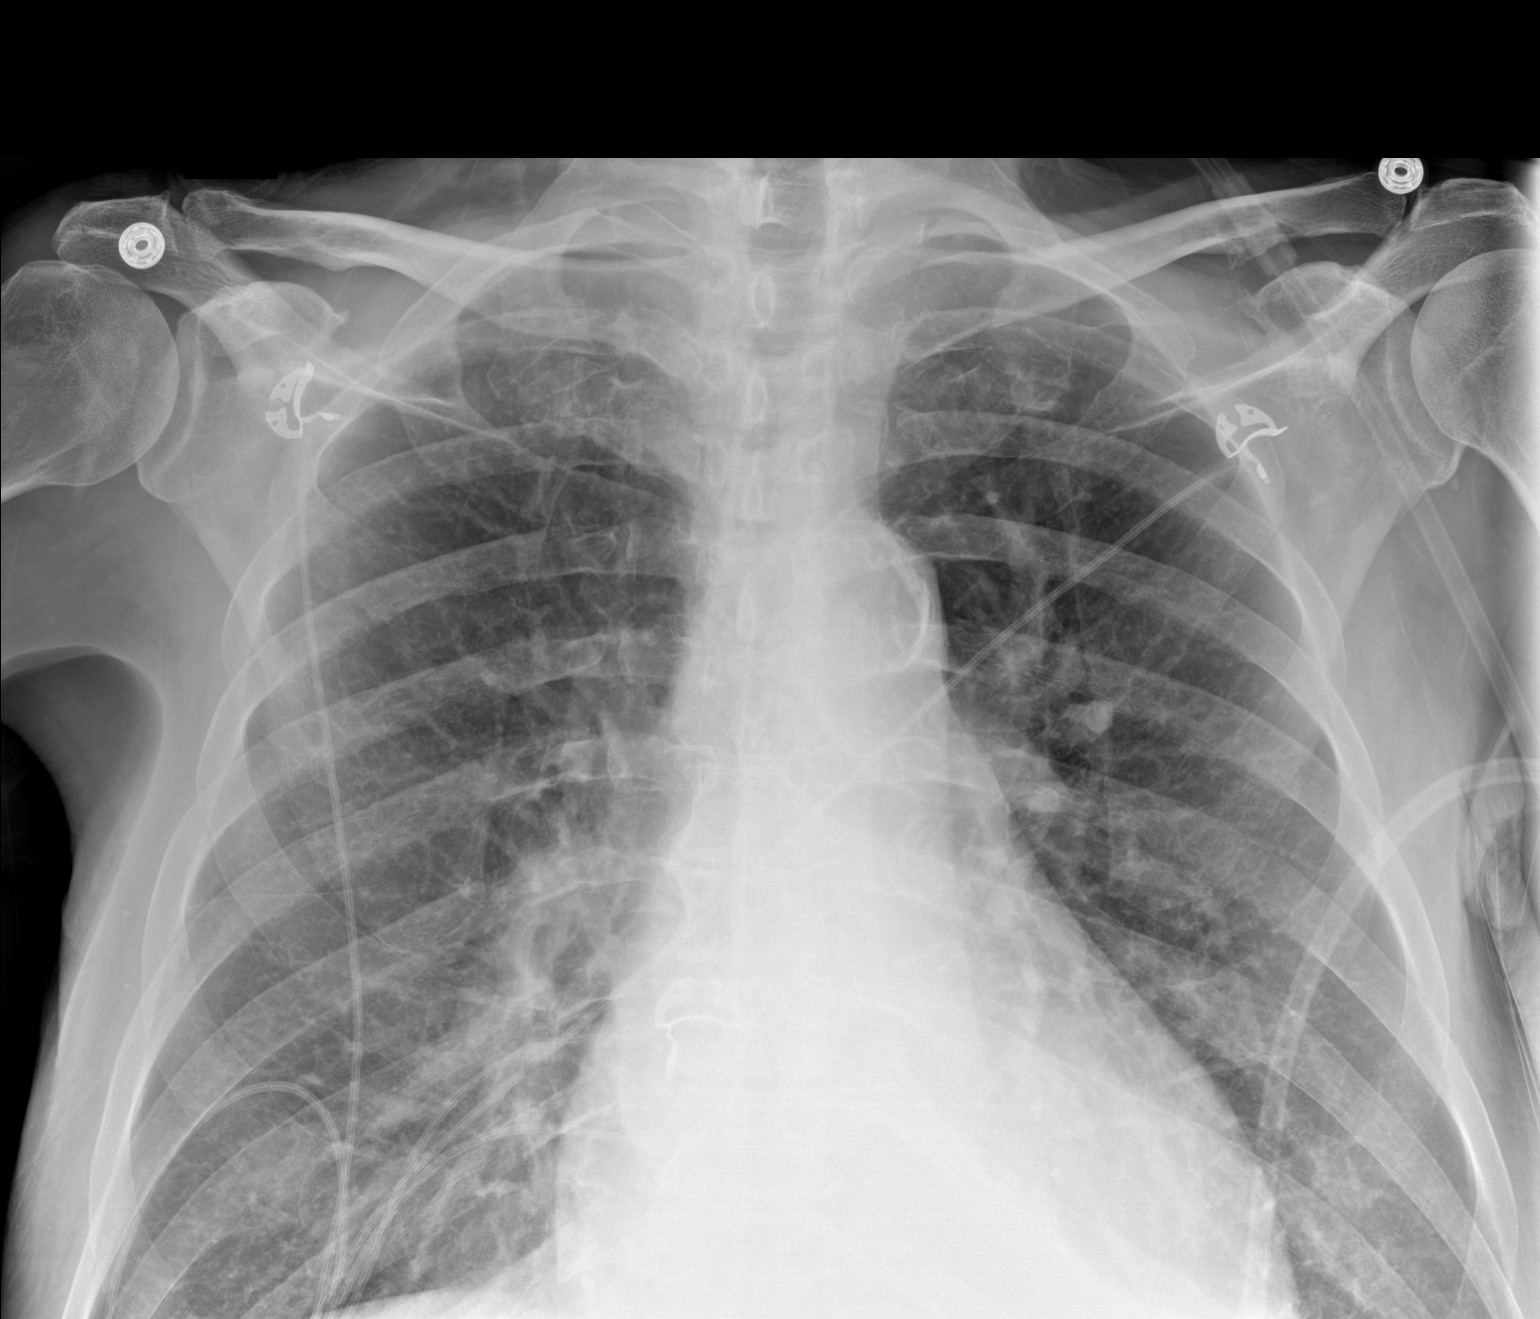

[chest ap (2 of 2)]
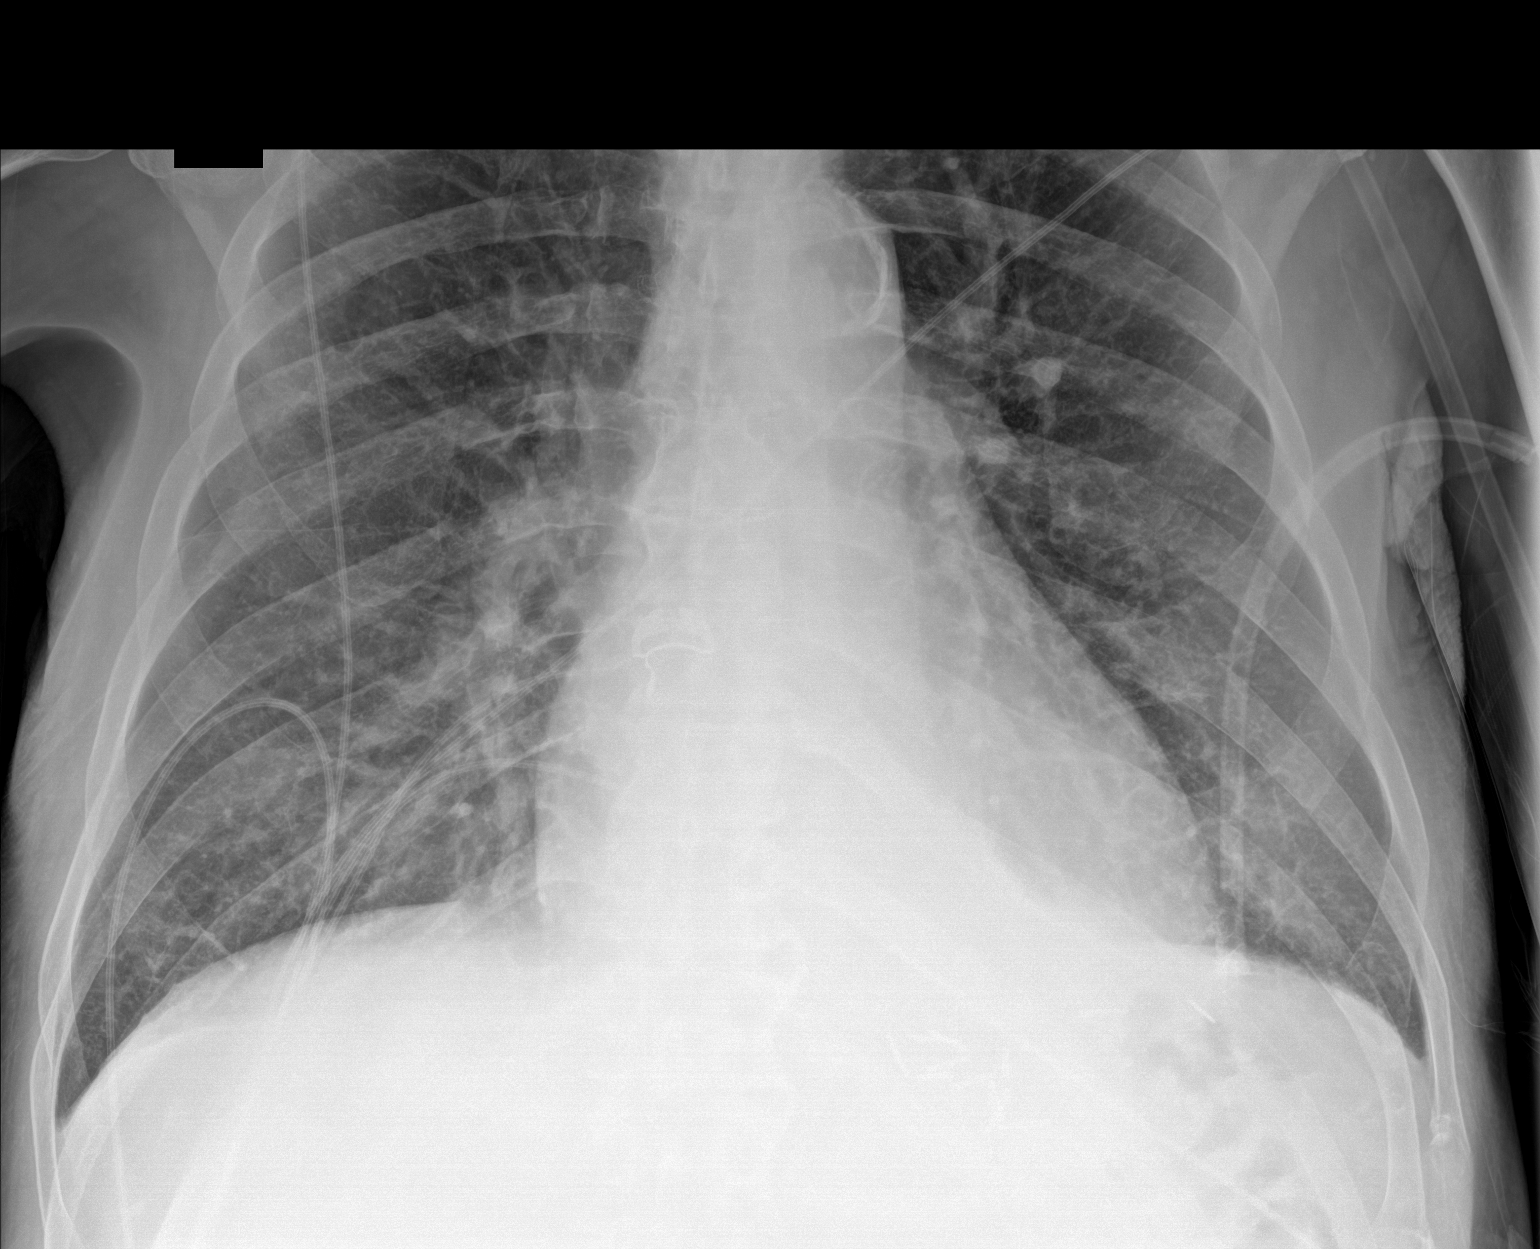

[2 of 2 positions shown; findings below may reference images not displayed]

FINDINGS: Decreased right lung base opacity consistent with the interval
thoracentesis. No visible residual fluid.

Opacity at the medial left lung base is consistent with a
combination of a residual small effusion and atelectasis.

Lungs demonstrate chronic interstitial thickening and mild apical
scarring, but are otherwise clear.

No pneumothorax.
IMPRESSION: 1. Significant decrease in right pleural effusion following
thoracentesis. No pneumothorax or other evidence of a procedure
complication.

## 2022-07-19 IMAGING — US US THORACENTESIS ASP PLEURAL SPACE W/IMG GUIDE
1 series · 5 of 5 positions shown · non-contrast
Comparison: none

INDICATION: Patient history of daily ETOH use, recent fall. CTA chest shows new
right pleural effusion. Request IR for diagnostic and therapeutic
right thoracentesis.

[Series 1: us thoracentesis asp pleural space w/img guide · 5 of 5 slices shown]
[im 1/5]
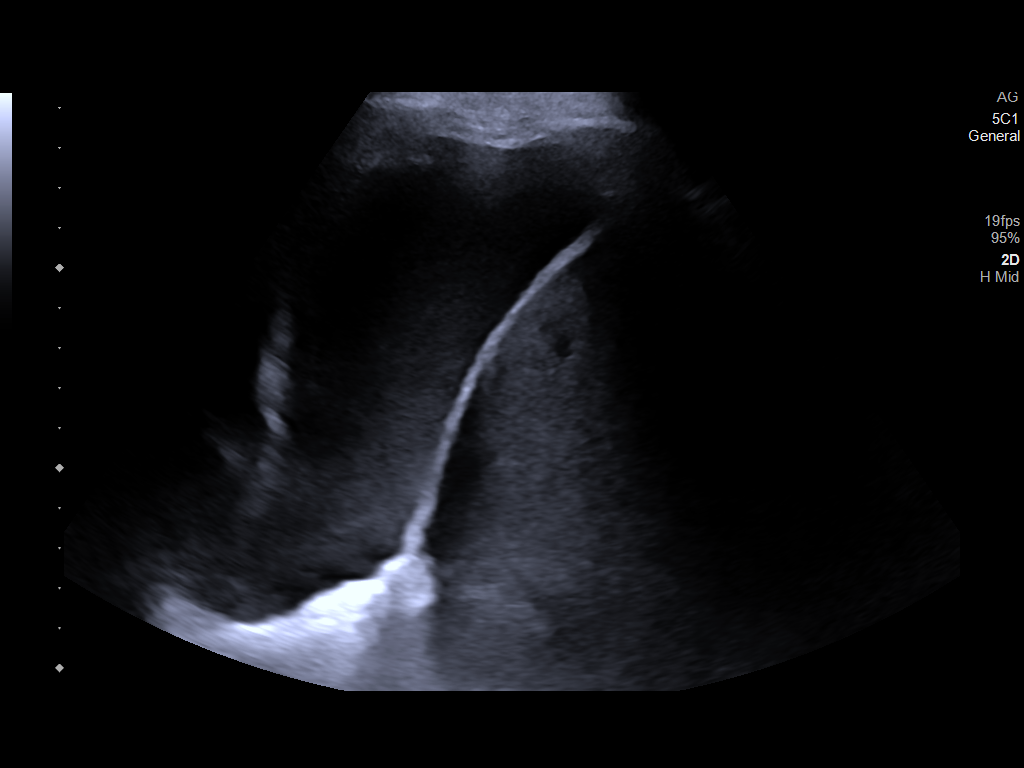
[im 2/5]
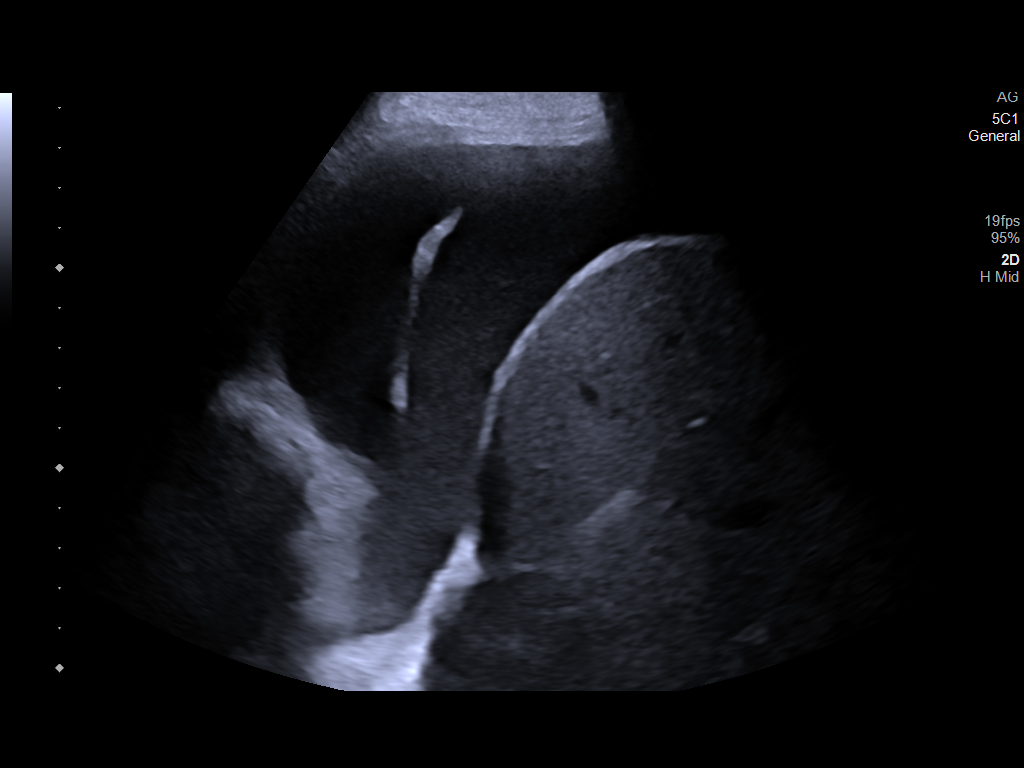
[im 3/5]
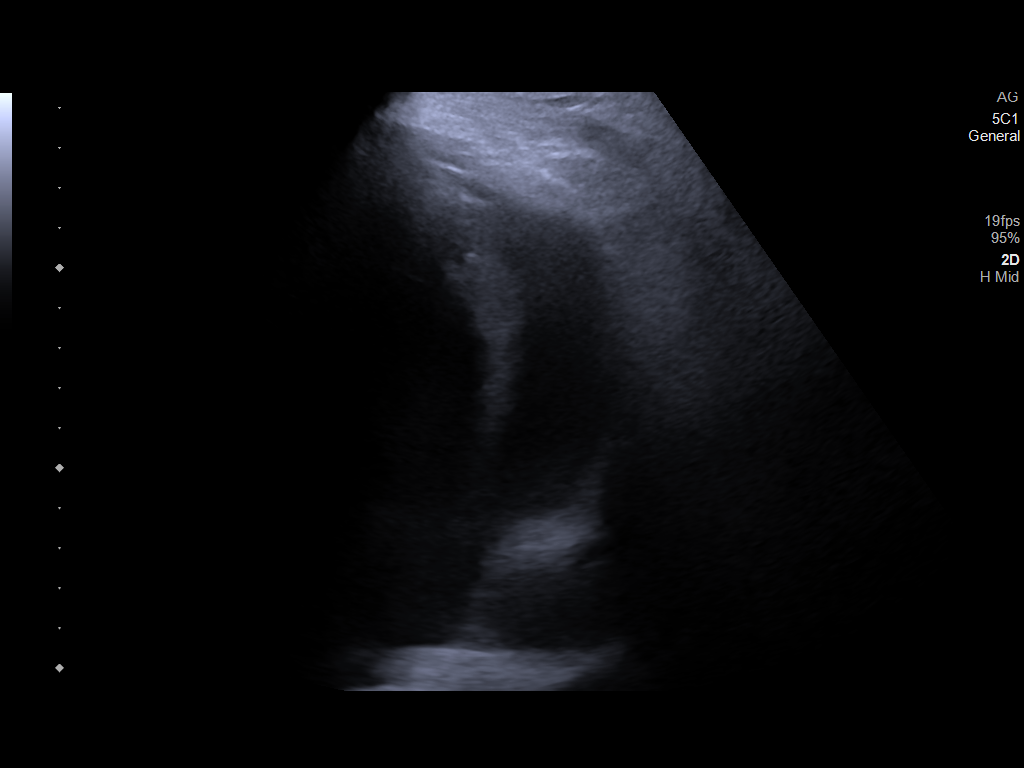
[im 4/5]
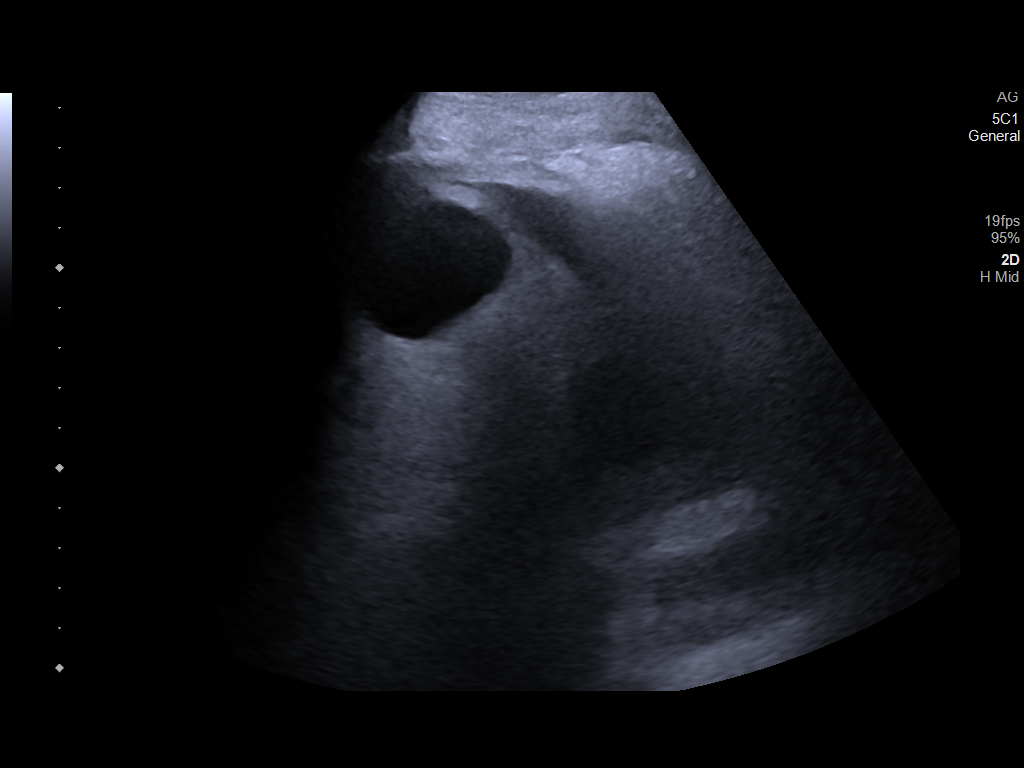
[im 5/5]
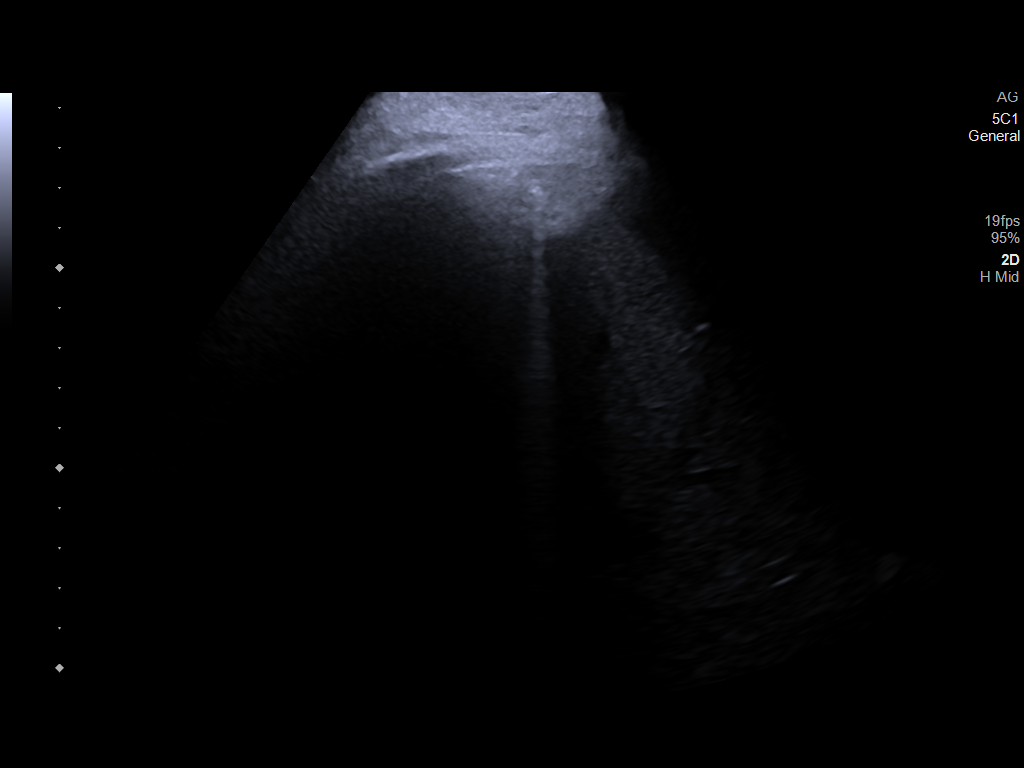

[5 of 5 positions shown; findings below may reference images not displayed]

EXAM:
ULTRASOUND GUIDED RIGHT THORACENTESIS

MEDICATIONS:
8 mL 1% lidocaine

COMPLICATIONS:
None immediate.

PROCEDURE:
An ultrasound guided thoracentesis was thoroughly discussed with the
patient and questions answered. The benefits, risks, alternatives
and complications were also discussed. The patient understands and
wishes to proceed with the procedure. Written consent was obtained.

Ultrasound was performed to localize and mark an adequate pocket of
fluid in the right chest. The area was then prepped and draped in
the normal sterile fashion. 1% Lidocaine was used for local
anesthesia. Under ultrasound guidance a 6 Fr Safe-T-Centesis
catheter was introduced. Thoracentesis was performed. The catheter
was removed and a dressing applied.
FINDINGS: A total of approximately 1.0 L of clear yellow fluid was removed.
Samples were sent to the laboratory as requested by the clinical
team.
IMPRESSION: Successful ultrasound guided right thoracentesis yielding 1.0 L of
pleural fluid.

Read by Cirilo, Farheen

## 2022-07-20 ENCOUNTER — Ambulatory Visit
Admission: EM | Admit: 2022-07-20 | Discharge: 2022-07-20 | Disposition: A | Discharge status: Home / Self Care | Payer: Medicare PPO | Attending: Emergency Medicine | Admitting: Emergency Medicine

## 2022-07-20 ENCOUNTER — Encounter: Payer: Self-pay | Admitting: Emergency Medicine

## 2022-07-20 ENCOUNTER — Ambulatory Visit (INDEPENDENT_AMBULATORY_CARE_PROVIDER_SITE_OTHER): Payer: Medicare PPO

## 2022-07-20 DIAGNOSIS — Z1152 Encounter for screening for COVID-19: Secondary | ICD-10-CM | POA: Insufficient documentation

## 2022-07-20 DIAGNOSIS — R0602 Shortness of breath: Secondary | ICD-10-CM | POA: Diagnosis not present

## 2022-07-20 DIAGNOSIS — J449 Chronic obstructive pulmonary disease, unspecified: Secondary | ICD-10-CM | POA: Diagnosis not present

## 2022-07-20 DIAGNOSIS — B338 Other specified viral diseases: Secondary | ICD-10-CM

## 2022-07-20 DIAGNOSIS — J22 Unspecified acute lower respiratory infection: Secondary | ICD-10-CM | POA: Diagnosis not present

## 2022-07-20 LAB — RESP PANEL BY RT-PCR (RSV, FLU A&B, COVID)  RVPGX2
Influenza A by PCR: NEGATIVE
Influenza B by PCR: NEGATIVE
Resp Syncytial Virus by PCR: POSITIVE — AB
SARS Coronavirus 2 by RT PCR: NEGATIVE

## 2022-07-20 MED ORDER — PREDNISONE 20 MG PO TABS: 40.0000 mg | ORAL_TABLET | Freq: Every day | ORAL | 0 refills | Status: DC

## 2022-07-20 MED ORDER — BENZONATATE 100 MG PO CAPS: 100.0000 mg | ORAL_CAPSULE | Freq: Three times a day (TID) | ORAL | 0 refills | Status: DC

## 2022-07-20 MED ORDER — PROMETHAZINE-DM 6.25-15 MG/5ML PO SYRP: 5.0000 mL | ORAL_SOLUTION | Freq: Every evening | ORAL | 0 refills | Status: DC | PRN

## 2022-07-20 MED ORDER — ALBUTEROL SULFATE HFA 108 (90 BASE) MCG/ACT IN AERS: 2.0000 | INHALATION_SPRAY | RESPIRATORY_TRACT | 0 refills | Status: DC | PRN

## 2022-07-20 NOTE — Discharge Instructions (Addendum)
Positive for RSV, negative for COVID and flu  RSV is a virus and will improve with time  Chest x-ray is negative  Starting tomorrow take prednisone every morning with food for 5 days to help reduce inflammation to the airway which ideally will help to calm your cough  Begin use of Tessalon pill every 8 hours to help calm your coughing  You may use albuterol inhaler taking 2 puffs every 4-6 hours as needed for shortness of breath or wheezing  Meclizine DM at bedtime to allow for rest and to calm your cough  May use humidifier to help moisten airway at nighttime, if you do not have a humidifier or steamy bathroom and sit inside for 5 to 10 minutes making it easier to breathe  If your symptoms persist or worsen please follow-up for reevaluation

## 2022-07-20 NOTE — ED Triage Notes (Signed)
Patient presents with cough, SOB, runny nose and sore throat x 5 days. His wife was diagnosed with RSV last week.

## 2022-07-20 NOTE — ED Provider Notes (Signed)
[facility]    CSN: [id number] Arrival date & time: 07/20/22  1347      History   Chief Complaint Chief Complaint  Patient presents with   Cough   Nasal Congestion   Sore Throat    HPI Craig [name] is a 81 y.o. male.   Patient presents with nasal congestion, rhinorrhea, sore throat, cough and present for 5.  Coughing is harsh and persistent coughing fits making it difficult for him to catch his breath.  Cough is primarily dry but occasionally can produce yellow sputum, so.  Appetite but tolerating some food and fluids.  Has attempted use of Mucinex DM which has been minimally effective.  Denies fever, chills, shortness of breath, chest pain or tightness.    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[facility])     Patient Active Problem List   Diagnosis Date Noted   Esophageal varices without bleeding (HCC)    Aortic atherosclerosis (HCC) 01/30/2022   Persistent atrial fibrillation (HCC) 12/23/2021   Alcoholic cirrhosis of liver with ascites (HCC) 12/23/2021   Atrial fibrillation, chronic (HCC) 12/18/2021   SOB (shortness of breath) 12/16/2021   Anasarca 12/16/2021   Ascites 12/16/2021   Falls 12/15/2021   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   Lymphedema of both lower extremities 05/17/2017   Drug-induced erectile dysfunction 12/28/2016   Chronic anticoagulation 12/28/2016   Nocturia associated with benign prostatic hyperplasia 05/11/2016   Seborrheic keratosis 03/09/2016   Renal stones 09/23/2015   Weight loss 07/20/2015   Insomnia 04/27/2015   Barrett esophagus 04/15/2015   Hyperlipidemia 04/15/2015   Temporary cerebral vascular dysfunction 04/15/2015   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   Recurrent pulmonary emboli (HCC) 07/29/2014   History of DVT of lower extremity 07/29/2014   Essential hypertension 07/25/2014    Past Surgical History:   Procedure Laterality Date   ESOPHAGOGASTRODUODENOSCOPY (EGD) WITH PROPOFOL N/A 01/31/2022   Procedure: ESOPHAGOGASTRODUODENOSCOPY (EGD) WITH PROPOFOL;  Surgeon: Midge Minium, MD;  Location: [facility] ENDOSCOPY;  Service: Endoscopy;  Laterality: N/A;   LEFT HEART CATHETERIZATION WITH CORONARY ANGIOGRAM Bilateral 07/27/2014   Procedure: LEFT HEART CATHETERIZATION WITH CORONARY ANGIOGRAM;  Surgeon: Runell Gess, MD;  Location: [facility] CATH LAB;  Service: Cardiovascular;  Laterality: Bilateral;   SPLENECTOMY, TOTAL     TTP       Home Medications    Prior to Admission medications   Medication Sig Start Date End Date Taking? Authorizing Provider  atorvastatin (LIPITOR) 40 MG tablet TAKE 1 TABLET (40 MG TOTAL) BY MOUTH DAILY. 02/13/22   Karamalegos, Netta Neat, DO  cholecalciferol (VITAMIN D) 1000 UNITS tablet Take 1,000 Units by mouth daily.    [provider]  eplerenone (INSPRA) 50 MG tablet Take 1 tablet (50 mg total) by mouth daily. 02/22/22   Karamalegos, Netta Neat, DO  escitalopram (LEXAPRO) 20 MG tablet TAKE 1 TABLET EVERY DAY 04/26/22   Karamalegos, Netta Neat, DO  fluticasone (FLONASE) 50 MCG/ACT nasal spray Place 2 sprays into both nostrils daily. Use for 4-6 weeks then stop and use seasonally or as needed. 12/08/21   Karamalegos, Netta Neat, DO  furosemide (LASIX) 40 MG tablet TAKE 1 TABLET (40 MG TOTAL) BY MOUTH DAILY AS NEEDED FOR FLUID OR EDEMA. 06/13/22   Karamalegos, Netta Neat, DO  lactulose (CHRONULAC) 10 GM/15ML solution TAKE 15-30 MLS BY MOUTH DAILY AS NEEDED FOR MILD  CONSTIPATION OR MODERATE CONSTIPATION. 06/12/22   Karamalegos, Netta Neat, DO  Melatonin 5 MG TABS Take 10 mg by mouth at bedtime. 3 tabs    [provider]  metoprolol succinate (TOPROL-XL) 25 MG 24 hr tablet TAKE 1 TABLET (25 MG TOTAL) BY MOUTH DAILY. 01/30/22   Althea Charon, Netta Neat, DO  omeprazole (PRILOSEC) 40 MG capsule TAKE 1 CAPSULE EVERY DAY 08/27/21   Karamalegos, Netta Neat, DO  vitamin  B-12 (CYANOCOBALAMIN) 500 MCG tablet Take 500 mcg by mouth daily.    [provider]  vitamin E 1000 UNIT capsule Take 1,000 Units by mouth daily.    [provider]  XARELTO 20 MG TABS tablet TAKE 1 TABLET (20 MG TOTAL) BY MOUTH DAILY WITH SUPPER. 02/13/22   Karamalegos, Netta Neat, DO    Family History Family History  Problem Relation Age of Onset   CAD Father 29   Kidney failure Mother    CAD Brother     Social History Social History   Tobacco Use   Smoking status: Former    Years: 20.00    Types: Cigarettes    Quit date: 07/24/1978    Years since quitting: 44.0   Smokeless tobacco: Former   Tobacco comments:    Quit tobacco in 1980  Vaping Use   Vaping Use: Never used  Substance Use Topics   Alcohol use: Not Currently    Alcohol/week: 3.0 standard drinks of alcohol    Types: 3 Shots of liquor per week    Comment: daily   Drug use: No     Allergies   Trazodone and nefazodone   Review of Systems Review of Systems  Constitutional: Negative.   HENT:  Positive for congestion, rhinorrhea and sore throat. Negative for dental problem, drooling, ear discharge, ear pain, facial swelling, hearing loss, mouth sores, nosebleeds, postnasal drip, sinus pressure, sinus pain, sneezing, tinnitus, trouble swallowing and voice change.   Respiratory:  Positive for cough and wheezing. Negative for apnea, choking, chest tightness, shortness of breath and stridor.   Cardiovascular: Negative.   Gastrointestinal: Negative.      Physical Exam Triage Vital Signs ED Triage Vitals confirmed by PCR, negative for flu negative for  Enc Vitals Group     BP 113/73     Pulse Rate 67     Resp 16     Temp 98.5 F (36.9 C)     Temp Source Oral     SpO2 90 %     Weight      Height      Head Circumference      Peak Flow      Pain Score      Pain Loc      Pain Edu?      Excl. in GC?    No data found.  Updated Vital Signs BP 113/73 (BP Location: Right Arm)   Pulse  67   Temp 98.5 F (36.9 C) (Oral)   Resp 16   SpO2 90%   Visual Acuity Right Eye Distance:   Left Eye Distance:   Bilateral Distance:    Right Eye Near:   Left Eye Near:    Bilateral Near:     Physical Exam Constitutional:      Appearance: Normal appearance.  HENT:     Head: Normocephalic.     Right Ear: Ear canal normal.     Left Ear: Tympanic membrane and ear canal normal.     Nose: Congestion and rhinorrhea present.  Mouth/Throat:     Pharynx: No posterior oropharyngeal erythema.     Tonsils: No tonsillar exudate. 0 on the right. 0 on the left.  Eyes:     Extraocular Movements: Extraocular movements intact.  Cardiovascular:     Rate and Rhythm: Normal rate and regular rhythm.     Pulses: Normal pulses.     Heart sounds: Normal heart sounds.  Pulmonary:     Effort: Pulmonary effort is normal.     Breath sounds: Normal breath sounds.  Skin:    General: Skin is warm and dry.  Neurological:     Mental Status: He is alert and oriented to person, place, and time. Mental status is at baseline.      UC Treatments / Results  Labs (all labs ordered are listed, but only abnormal results are displayed) Labs Reviewed  RESP PANEL BY RT-PCR (RSV, FLU A&B, COVID)  [id number]    EKG   Radiology No results found.  Procedures Procedures (including critical care time)  Medications Ordered in UC Medications - No data to display  Initial Impression / Assessment and Plan / UC Course  I have reviewed the triage vital signs and the nursing notes.  Pertinent labs & imaging results that were available during my care of the patient were reviewed by me and considered in my medical decision making (see chart for details).  RSV infection  Vitals are stable, O2 saturation 90%, lungs are clear to auscultation, confirmed by PCR, negative for flu and COVID, chest x-ray negative, discussed findings with patient and spouse, prescribed albuterol, prednisone, Tessalon and Promethazine  DM for outpatient management, may continue use of over-the-counter medications as needed for additional support with follow-up with urgent care or primary doctor if symptoms persist or worsen Final Clinical Impressions(s) / UC Diagnoses   Final diagnoses:  None   Discharge Instructions   None    ED Prescriptions   None    PDMP not reviewed this encounter.   Valinda Hoar, NP 07/20/22 1530

## 2022-08-14 ENCOUNTER — Encounter: Payer: Self-pay | Admitting: Family Medicine

## 2022-08-14 ENCOUNTER — Ambulatory Visit: Payer: Medicare PPO | Admitting: Family Medicine

## 2022-08-14 VITALS — BP 126/70 | HR 83 | Ht 75.0 in | Wt 244.2 lb

## 2022-08-14 DIAGNOSIS — I482 Chronic atrial fibrillation, unspecified: Secondary | ICD-10-CM | POA: Diagnosis not present

## 2022-08-14 DIAGNOSIS — Z23 Encounter for immunization: Secondary | ICD-10-CM

## 2022-08-14 DIAGNOSIS — I700 Atherosclerosis of aorta: Secondary | ICD-10-CM | POA: Diagnosis not present

## 2022-08-14 DIAGNOSIS — K219 Gastro-esophageal reflux disease without esophagitis: Secondary | ICD-10-CM

## 2022-08-14 DIAGNOSIS — F331 Major depressive disorder, recurrent, moderate: Secondary | ICD-10-CM | POA: Diagnosis not present

## 2022-08-14 DIAGNOSIS — Z0000 Encounter for general adult medical examination without abnormal findings: Secondary | ICD-10-CM

## 2022-08-14 DIAGNOSIS — K7030 Alcoholic cirrhosis of liver without ascites: Secondary | ICD-10-CM | POA: Diagnosis not present

## 2022-08-14 DIAGNOSIS — F5101 Primary insomnia: Secondary | ICD-10-CM

## 2022-08-14 DIAGNOSIS — I10 Essential (primary) hypertension: Secondary | ICD-10-CM

## 2022-08-14 MED ORDER — ESCITALOPRAM OXALATE 20 MG PO TABS: 20.0000 mg | ORAL_TABLET | Freq: Every day | ORAL | 3 refills | Status: DC

## 2022-08-14 MED ORDER — OMEPRAZOLE 40 MG PO CPDR: 40.0000 mg | DELAYED_RELEASE_CAPSULE | Freq: Every day | ORAL | 3 refills | Status: DC

## 2022-08-14 NOTE — Patient Instructions (Addendum)
Thank you for coming to the office today.  Keep up the good work with improvement. Remain off Alcohol Remain off Spironolactone since no longer needed Keep up with Dr Allen Norris for surveillance and lab / imaging ultrasound checking Refilled some meds through [employer]. Keep on heart medication AFib treatment.  Please schedule a Follow-up Appointment to: Return in about 6 months (around 02/12/2023) for 6 month Followup HTN, Liver updates.  If you have any other questions or concerns, please feel free to call the office or send a message through [place]. You may also schedule an earlier appointment if necessary.  Additionally, you may be receiving a survey about your experience at our office within a few days to 1 week by e-mail or mail. We value your feedback.  Craig Putnam, DO [place]

## 2022-08-14 NOTE — Progress Notes (Signed)
Subjective:    Patient ID: Craig [name], male    DOB: 08-13-1940, 82 y.o.   MRN: [id number]  Craig [name] is a 82 y.o. male presenting on 08/14/2022 for Annual Exam   HPI  Here for Annual Physical  Updates in interval 07/20/22 [facility] urgent care, improving S/p cataract surgery bilateral since last visit no alcohol, zero intake since  earlier last year. Last visit with [facility] Dr Servando Snare 07/03/22 - He was taken off Aldactone due to side effect gynecomastia, he had actually improved urination Monitoring ultrasound and labs every 6 months AFP + [place]   Cardiology Permanent Atrial Fibrillation Followed by EP Dr [facility] He is managed on Metoprolol XL for rate control and Xarelto for anticoagulation They did not recommend invasive EP procedure at this time.   Hypertension Low BP lately He remains off Losartan and Amlodipine Continues on Metoprolol for rate.   Gynecomastia - improved, off Spironolactone   Mild Cognitive Impairment with Memory loss Previously w hospitalization and liver disease had worse confusion, now improved. 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 He sleeps during the day with naps. Long term problem.   Recurrent PE/DVT, on anticoagulation lifelong Reports no new problem. Continues to take Xarelto daily for anticoagulation   Health Maintenance: Update Pneumonia vaccine to Prevnar20 Due for Flu Shot today.     08/14/2022   11:01 AM 12/23/2021    3:32 PM 12/08/2021   12:09 PM  Depression screen PHQ 2/9  Decreased Interest 2 1 1   Down, Depressed, Hopeless 3 0 0  PHQ - 2 Score 5 1 1   Altered  sleeping 3 3   Tired, decreased energy 3 3   Change in appetite 3 1   Feeling bad or failure about yourself  3 1   Trouble concentrating 3 3   Moving slowly or fidgety/restless 1 1   Suicidal thoughts 1 0   PHQ-9 Score 22 13   Difficult doing work/chores Somewhat difficult Somewhat difficult     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[facility])    Past Surgical History:  Procedure Laterality Date   ESOPHAGOGASTRODUODENOSCOPY (EGD) WITH PROPOFOL N/A 01/31/2022   Procedure: ESOPHAGOGASTRODUODENOSCOPY (EGD) WITH PROPOFOL;  Surgeon: , MD;  Location: [facility] ENDOSCOPY;  Service: Endoscopy;  Laterality: N/A;   LEFT HEART CATHETERIZATION WITH CORONARY ANGIOGRAM Bilateral 07/27/2014   Procedure: LEFT HEART CATHETERIZATION WITH CORONARY ANGIOGRAM;  Surgeon: 04/03/2022, MD;  Location: [facility] CATH LAB;  Service: Cardiovascular;  Laterality: Bilateral;   SPLENECTOMY, TOTAL     TTP   Social History   Socioeconomic History   Marital status: Single    Spouse name: Not on file   Number of children: Not on file   Years of education: Not on file   Highest education level: Not on file  Occupational History   Occupation: Retired 09/25/2014)  Tobacco Use   Smoking status: Former    Years: 20.00    Types: Cigarettes    Quit date: 07/24/1978    Years  since quitting: 44.0   Smokeless tobacco: Former   Tobacco comments:    Quit tobacco in 1980  Vaping Use   Vaping Use: Never used  Substance and Sexual Activity   Alcohol use: Not Currently    Alcohol/week: 3.0 standard drinks of alcohol    Types: 3 Shots of liquor per week    Comment: daily   Drug use: No   Sexual activity: Yes    Birth control/protection: None  Other Topics Concern   Not on file  Social History Narrative   Retired from [employer]. Retired x 20 years.  Two children.     Social Determinants  of Health   Financial Resource Strain: Low Risk  (03/22/2022)   Overall Financial Resource Strain (CARDIA)    Difficulty of Paying Living Expenses: Not hard at all  Food Insecurity: No Food Insecurity (04/19/2022)   Hunger Vital Sign    Worried About Running Out of Food in the Last Year: Never true    Ran Out of Food in the Last Year: Never true  Transportation Needs: No Transportation Needs (03/22/2022)   PRAPARE - Administrator, Civil Service (Medical): No    Lack of Transportation (Non-Medical): No  Physical Activity: Inactive (03/22/2022)   Exercise Vital Sign    Days of Exercise per Week: 0 days    Minutes of Exercise per Session: 0 min  Stress: Stress Concern Present (03/22/2022)   [employer] of Occupational Health - Occupational Stress Questionnaire    Feeling of Stress : Very much  Social Connections: Socially Integrated (03/22/2022)   Social Connection and Isolation Panel [NHANES]    Frequency of Communication with Friends and Family: Twice a week    Frequency of Social Gatherings with Friends and Family: Twice a week    Attends Religious Services: 1 to 4 times per year    Active Member of [employer] or Organizations: Yes    Attends Banker Meetings: 1 to 4 times per year    Marital Status: Living with partner  Intimate Partner Violence: Not At Risk (03/22/2022)   Humiliation, Afraid, Rape, and Kick questionnaire    Fear of Current or Ex-Partner: No    Emotionally Abused: No    Physically Abused: No    Sexually Abused: No   Family History  Problem Relation Age of Onset   CAD Father 64   Kidney failure Mother    CAD Brother    Current Outpatient Medications on File Prior to Visit  Medication Sig   atorvastatin (LIPITOR) 40 MG tablet TAKE 1 TABLET (40 MG TOTAL) BY MOUTH DAILY.   cholecalciferol (VITAMIN D) 1000 UNITS tablet Take 1,000 Units by mouth daily.   fluticasone (FLONASE) 50 MCG/ACT nasal spray Place 2 sprays into both nostrils daily.  Use for 4-6 weeks then stop and use seasonally or as needed.   lactulose (CHRONULAC) 10 GM/15ML solution TAKE 15-30 MLS BY MOUTH DAILY AS NEEDED FOR MILD CONSTIPATION OR MODERATE CONSTIPATION.   Melatonin 5 MG TABS Take 10 mg by mouth at bedtime. 3 tabs   metoprolol succinate (TOPROL-XL) 25 MG 24 hr tablet TAKE 1 TABLET (25 MG TOTAL) BY MOUTH DAILY.   vitamin B-12 (CYANOCOBALAMIN) 500 MCG tablet Take 500 mcg by mouth daily.   vitamin E 1000 UNIT capsule Take 1,000 Units by mouth daily.   XARELTO 20 MG TABS tablet TAKE 1 TABLET (20 MG TOTAL) BY MOUTH DAILY WITH SUPPER.   No current facility-administered medications on file  prior to visit.    Review of Systems  Constitutional:  Negative for activity change, appetite change, chills, diaphoresis, fatigue and fever.  HENT:  Negative for congestion and hearing loss.   Eyes:  Negative for visual disturbance.  Respiratory:  Negative for cough, chest tightness, shortness of breath and wheezing.   Cardiovascular:  Negative for chest pain, palpitations and leg swelling.  Gastrointestinal:  Negative for abdominal pain, constipation, diarrhea, nausea and vomiting.  Genitourinary:  Negative for dysuria, frequency and hematuria.  Musculoskeletal:  Negative for arthralgias and neck pain.  Skin:  Negative for rash.  Neurological:  Negative for dizziness, weakness, light-headedness, numbness and headaches.  Hematological:  Negative for adenopathy.  Psychiatric/Behavioral:  Negative for behavioral problems, dysphoric mood and sleep disturbance.    Per HPI unless specifically indicated above      Objective:    BP 126/70   Pulse 83   Ht 6\' 3"  (1.905 m)   Wt 244 lb 3.2 oz (110.8 kg)   SpO2 97%   BMI 30.52 kg/m   Wt Readings from Last 3 Encounters:  08/14/22 244 lb 3.2 oz (110.8 kg)  07/03/22 242 lb (109.8 kg)  06/21/22 243 lb (110.2 kg)    Physical Exam Vitals and nursing note reviewed.  Constitutional:      General: He is not in acute  distress.    Appearance: He is well-developed. He is not diaphoretic.     Comments: Well-appearing, comfortable, cooperative  HENT:     Head: Normocephalic and atraumatic.  Eyes:     General:        Right eye: No discharge.        Left eye: No discharge.     Conjunctiva/sclera: Conjunctivae normal.     Pupils: Pupils are equal, round, and reactive to light.  Neck:     Thyroid: No thyromegaly.  Cardiovascular:     Rate and Rhythm: Normal rate. Rhythm irregular.     Pulses: Normal pulses.     Heart sounds: Normal heart sounds. No murmur heard. Pulmonary:     Effort: Pulmonary effort is normal. No respiratory distress.     Breath sounds: Normal breath sounds. No wheezing or rales.  Abdominal:     General: Bowel sounds are normal. There is no distension.     Palpations: Abdomen is soft. There is no mass.     Tenderness: There is no abdominal tenderness.  Musculoskeletal:        General: No tenderness. Normal range of motion.     Cervical back: Normal range of motion and neck supple.     Right lower leg: Edema (Improved) present.     Left lower leg: Edema present.     Comments: Upper / Lower Extremities: - Normal muscle tone, strength bilateral upper extremities 5/5, lower extremities 5/5  Lymphadenopathy:     Cervical: No cervical adenopathy.  Skin:    General: Skin is warm and dry.     Findings: No erythema or rash.  Neurological:     Mental Status: He is alert and oriented to person, place, and time.     Comments: Distal sensation intact to light touch all extremities  Psychiatric:        Mood and Affect: Mood normal.        Behavior: Behavior normal.        Thought Content: Thought content normal.     Comments: Well groomed, good eye contact, normal speech and thoughts     Results for  orders placed or performed during the hospital encounter of 07/20/22  Resp panel by RT-PCR (RSV, Flu A&B, Covid) Anterior Nasal Swab   Specimen: Anterior Nasal Swab  Result Value Ref  Range   SARS Coronavirus 2 by RT PCR NEGATIVE NEGATIVE   Influenza A by PCR NEGATIVE NEGATIVE   Influenza B by PCR NEGATIVE NEGATIVE   Resp Syncytial Virus by PCR POSITIVE (A) NEGATIVE      Assessment & Plan:   Problem List Items Addressed This Visit     Essential hypertension (Chronic)   Alcoholic cirrhosis of liver with ascites (HCC)   Aortic atherosclerosis (HCC)   Atrial fibrillation, chronic (HCC)   Insomnia   Relevant Medications   escitalopram (LEXAPRO) 20 MG tablet   Moderate recurrent major depression (HCC)   Relevant Medications   escitalopram (LEXAPRO) 20 MG tablet   Other Visit Diagnoses     Annual physical exam    -  Primary   Needs flu shot       Relevant Orders   Flu Vaccine QUAD High Dose(Fluad) (Completed)   Need for vaccination for pneumococcus       Relevant Orders   Pneumococcal conjugate vaccine 20-valent (Completed)   Gastroesophageal reflux disease without esophagitis       Relevant Medications   omeprazole (PRILOSEC) 40 MG capsule       Updated Health Maintenance information Encouraged improvement to lifestyle with diet and exercise Goal of weight loss  Keep up the good work with improvement overall with management of Cirrhosis. Remain off Alcohol, he is alcohol free since previous visit. Remain off Spironolactone since no longer needed for ascites - resolved. Last work up per GI was negative. Keep up with Dr Allen Norris for surveillance and lab / imaging ultrasound Refilled some meds through [facility]. Flu and Prevnar20 today Continue w/ Cardiology for chronic persistent AFib on Xarelto + Metoprolol. No other procedure for AFib  Meds ordered this encounter  Medications   escitalopram (LEXAPRO) 20 MG tablet    Sig: Take 1 tablet (20 mg total) by mouth daily.    Dispense:  90 tablet    Refill:  3   omeprazole (PRILOSEC) 40 MG capsule    Sig: Take 1 capsule (40 mg total) by mouth daily.    Dispense:  90 capsule    Refill:   3      Follow up plan: Return in about 6 months (around 02/12/2023) for 6 month Followup HTN, Liver updates.  Nobie Putnam, [facility] 08/14/2022, 11:11 AM

## 2022-08-18 ENCOUNTER — Ambulatory Visit: Payer: Self-pay | Admitting: *Deleted

## 2022-08-18 NOTE — Patient Instructions (Signed)
Visit Information  Thank you for taking time to visit with me today. Please don't hesitate to contact me if I can be of assistance to you before our next scheduled telephone appointment.  Following are the goals we discussed today:  Listen for calls from PCP and GI offices to schedule follow up appointments.   Our next appointment is by telephone on 3/18  Please call the care guide team at [phone] if you need to cancel or reschedule your appointment.   Please call the Suicide and Crisis Lifeline: 988 call the [place] National Suicide Prevention Lifeline: (804) 161-4322 or TTY: 662 704 2554 TTY 479-426-4082) to talk to a trained counselor call 1-800-273-TALK (toll free, 24 hour hotline) call 911 if you are experiencing a Mental Health or [place] or need someone to talk to.  Patient verbalizes understanding of instructions and care plan provided today and agrees to view in [place]. Active MyChart status and patient understanding of how to access instructions and care plan via MyChart confirmed with patient.     The patient has been provided with contact information for the care management team and has been advised to call with any health related questions or concerns.   Valente David, RN, MSN, [place] Care Management Care Management Coordinator [phone]

## 2022-08-18 NOTE — Patient Outreach (Signed)
  Care Coordination   Follow Up Visit Note   08/18/2022 Name: Craig [name] MRN: [id number] DOB: October 24, 1940  Craig [name] is a 82 y.o. year old male who sees Olin Hauser, DO for primary care. I spoke with  Craig [name] by phone today.  What matters to the patients health and wellness today?  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      Goals Addressed             This Visit's Progress    RNCM: Effective Management of Cirrhosis   On track    Care Coordination Interventions: Evaluation of current treatment plan related to cirrhosis of the  and patient's adherence to plan as established by provider.  Advised patient to monitor for weight gain, shortness of breath, changes in memory, and other sx and sx of worsening conditions associated with cirrhosis of the liver. Review of worsening sx and sx of condition and review of how to respond if the patient  has acute onset of weight gain and shortness of breath.  Provided education regarding management of cirrhosis Reviewed medications with patient and discussed compliance. Rise Paganini, the patients significant other states the patient is taking Inspra, currently not needing Lasix Reviewed scheduled/upcoming provider appointments including appointments need for 6 month follow up with GI for June and with PCP in July Discussed plans with patient for ongoing care management follow up and provided patient with direct contact information for care management team             SDOH assessments and interventions completed:  No     Care Coordination Interventions:  Yes, provided   Follow up plan: Follow up call scheduled for 3/18    Encounter Outcome:  Pt. Visit Completed   Valente David, RN, MSN, [place] Care Management Care Management Coordinator [phone]

## 2022-08-24 ENCOUNTER — Encounter: Payer: Self-pay | Admitting: Gastroenterology

## 2022-10-09 ENCOUNTER — Ambulatory Visit: Payer: Self-pay | Admitting: *Deleted

## 2022-10-09 NOTE — Patient Instructions (Signed)
Visit Information  Thank you for taking time to visit with me today. Please don't hesitate to contact me if I can be of assistance to you before our next scheduled telephone appointment.  Following are the goals we discussed today:  Call GI office again to schedule follow up. Consider speaking with social worker regarding depression.   Our next appointment is by telephone on 4/1  Please call the care guide team at [phone] if you need to cancel or reschedule your appointment.   Please call the Suicide and Crisis Lifeline: 988 call the [place] National Suicide Prevention Lifeline: (602) 404-2805 or TTY: 406 171 2562 TTY 337-368-7850) to talk to a trained counselor call 1-800-273-TALK (toll free, 24 hour hotline) call 911 if you are experiencing a Mental Health or [place] or need someone to talk to.  Patient verbalizes understanding of instructions and care plan provided today and agrees to view in [place]. Active MyChart status and patient understanding of how to access instructions and care plan via MyChart confirmed with patient.     The patient has been provided with contact information for the care management team and has been advised to call with any health related questions or concerns.   [place] Management Care Management Coordinator [phone]

## 2022-10-09 NOTE — Patient Outreach (Signed)
  Care Coordination   Follow Up Visit Note   10/09/2022 Name: Craig [name] MRN: [id number] DOB: Oct 11, 1940  Craig [name] is a 82 y.o. year old male who sees Olin Hauser, DO for primary care. I spoke with  significant other of Craig [name] by phone today.  What matters to the patients health and wellness today?  She has had difficulty securing appointment with GI due to schedule being locked will call back.  Patient is also depressed according to [facility] and has gained weight.  Declined offer for CSW involvement.     Goals Addressed             This Visit's Progress    RNCM: Effective Management of Cirrhosis   On track    Care Coordination Interventions: Evaluation of current treatment plan related to cirrhosis of the  and patient's adherence to plan as established by provider.  Advised patient to monitor for weight gain, shortness of breath, changes in memory, and other sx and sx of worsening conditions associated with cirrhosis of the liver. Review of worsening sx and sx of condition and review of how to respond if the patient  has acute onset of weight gain and shortness of breath.  Provided education regarding management of cirrhosis Reviewed medications with patient and discussed compliance. Craig [name], the patients significant other states the patient is taking Inspra, currently not needing Lasix Reviewed scheduled/upcoming provider appointments including appointments need for 6 month follow up with GI for June and with PCP in July.  Craig [name] called GI office, calendar is not open yet for appointments in June.  She will call back to follow up Discussed plans with patient for ongoing care management follow up and provided patient with direct contact information for care management team Patient has has 20 pound weight gain since AWV in January, denies patient is short of breath.  Wife state he has been depressed and eating a lot of unhealthy foods.  Offered to have CSW for  counseling, declined.             SDOH assessments and interventions completed:  No     Care Coordination Interventions:  Yes, provided   Interventions Today    Flowsheet Row Most Recent Value  Chronic Disease   Chronic disease during today's visit Other  [cirrhosis]  General Interventions   General Interventions Discussed/Reviewed General Interventions Reviewed, Doctor Visits  Doctor Visits Discussed/Reviewed Doctor Visits Reviewed, PCP, Specialist  PCP/Specialist Visits Compliance with follow-up visit  Exercise Interventions   Exercise Discussed/Reviewed Weight Managment  Weight Management Weight loss  Education Interventions   Education Provided Provided Education  Provided Verbal Education On When to see the doctor, Nutrition  Nutrition Interventions   Nutrition Discussed/Reviewed Nutrition Reviewed, Adding fruits and vegetables, Portion sizes        Follow up plan: Follow up call scheduled for 4/1    Encounter Outcome:  Pt. Visit Completed   Valente David, RN, MSN, [place] Care Management Care Management Coordinator [phone]

## 2022-10-23 ENCOUNTER — Ambulatory Visit: Payer: Self-pay | Admitting: *Deleted

## 2022-10-23 NOTE — Patient Outreach (Signed)
  Care Coordination   Follow Up Visit Note   10/23/2022 Name: Craig [name] MRN: [id number] DOB: 02-09-1941  Craig [name] is a 82 y.o. year old male who sees Olin Hauser, DO for primary care. I spoke with Craig [name], wife of Craig [name] by phone today.  What matters to the patients health and wellness today?  Per wife, she called GI office last week and was told the schedule for June was still not available.  This care manager called today, unsuccessful, voice message was left with clinic.  Wife will try again tomorrow.     Goals Addressed             This Visit's Progress    RNCM: Effective Management of Cirrhosis   On track    Care Coordination Interventions: Evaluation of current treatment plan related to cirrhosis of the  and patient's adherence to plan as established by provider.  Advised patient to monitor for weight gain, shortness of breath, changes in memory, and other sx and sx of worsening conditions associated with cirrhosis of the liver. Review of worsening sx and sx of condition and review of how to respond if the patient  has acute onset of weight gain and shortness of breath.  Provided education regarding management of cirrhosis Reviewed medications with patient and discussed compliance. Craig [name], the patients significant other states the patient is taking Inspra, currently not needing Lasix Reviewed scheduled/upcoming provider appointments including appointments need for 6 month follow up with GI for June and with PCP in July.  Craig [name] called GI office, calendar is not open yet for appointments in June.  She will call back to follow up Discussed plans with patient for ongoing care management follow up and provided patient with direct contact information for care management team Patient has has 20 pound weight gain since AWV in January, denies patient is short of breath.  Wife state he has been depressed and eating a lot of unhealthy foods.  Offered to have CSW for  counseling, declined.             SDOH assessments and interventions completed:  No     Care Coordination Interventions:  Yes, provided   Interventions Today    Flowsheet Row Most Recent Value  Chronic Disease   Chronic disease during today's visit Other  General Interventions   General Interventions Discussed/Reviewed General Interventions Reviewed, Communication with, Doctor Visits  Doctor Visits Discussed/Reviewed Specialist, Doctor Visits Reviewed  Communication with PCP/Specialists  [Call placed for follow up appointment]        Follow up plan: Follow up call scheduled for 4/8    Encounter Outcome:  Pt. Visit Completed   Valente David, RN, MSN, [place] Care Management Care Management Coordinator [phone]

## 2022-10-30 ENCOUNTER — Ambulatory Visit: Payer: Self-pay | Admitting: *Deleted

## 2022-10-30 NOTE — Patient Outreach (Signed)
  Care Coordination   Follow Up Visit Note   10/30/2022 Name: Craig [name] MRN: [id number] DOB: April 12, 1941  SHAHMIR [name] is a 82 y.o. year old male who sees Smitty Cords, DO for primary care. I spoke with Meriam Sprague, wife of Craig [name] by phone today.  What matters to the patients health and wellness today?  Per wife, still does not have appointment with GI scheduled for June yet.  Will continue to call for appointment.     Goals Addressed             This Visit's Progress    RNCM: Effective Management of Cirrhosis   On track    Care Coordination Interventions: Evaluation of current treatment plan related to cirrhosis of the  and patient's adherence to plan as established by provider.  Advised patient to monitor for weight gain, shortness of breath, changes in memory, and other sx and sx of worsening conditions associated with cirrhosis of the liver. Review of worsening sx and sx of condition and review of how to respond if the patient  has acute onset of weight gain and shortness of breath.  Provided education regarding management of cirrhosis Reviewed medications with patient and discussed compliance. Meriam Sprague, the patients significant other states the patient is taking Inspra, currently not needing Lasix Reviewed scheduled/upcoming provider appointments including appointments need for 6 month follow up with GI for June and with PCP in July.  Call placed to GI office, no answer, message left Discussed plans with patient for ongoing care management follow up and provided patient with direct contact information for care management team Patient has has 20 pound weight gain since AWV in January, denies patient is short of breath.  Wife state he has been depressed and eating a lot of unhealthy foods.  Offered to have CSW for counseling, declined.             SDOH assessments and interventions completed:  No     Care Coordination Interventions:  Yes, provided    Interventions Today    Flowsheet Row Most Recent Value  Chronic Disease   Chronic disease during today's visit Other  General Interventions   General Interventions Discussed/Reviewed Communication with  PCP/Specialist Visits Compliance with follow-up visit  Communication with PCP/Specialists        Follow up plan: Follow up call scheduled for [phone]    Encounter Outcome:  Pt. Visit Completed   Kemper Durie, RN, MSN, [facility] Care Management Care Management Coordinator [phone]

## 2022-11-01 ENCOUNTER — Telehealth: Payer: Self-pay

## 2022-11-01 DIAGNOSIS — K7031 Alcoholic cirrhosis of liver with ascites: Secondary | ICD-10-CM

## 2022-11-01 NOTE — Telephone Encounter (Signed)
-----   Message from Midge Minium, MD sent at 11/01/2022  8:04 AM EDT ----- Please make sure that this patient's wife and the patient know that they do not need to see me every 6 months but only need the ultrasound and alpha-fetoprotein.  If they are having an issue and they need to be seen then I are happy to see him.  Otherwise he does not need to wait for an appointment just to get the ultrasound and alpha-fetoprotein checked. ----- Message ----- From: Kemper Durie, RN Sent: 10/30/2022   2:25 PM EDT To: Rayann Heman, CMA; Midge Minium, MD  Understood.  This may just need to be explained to the wife. Thanks, Kemper Durie [facility] Care Management Care Management Coordinator [phone]  ----- Message ----- From: Midge Minium, MD Sent: 10/30/2022   1:51 PM EDT To: Kemper Durie, RN; Rayann Heman, CMA  This 38-month follow-up was as per American liver foundation guidelines for a repeat ultrasound and alpha-fetoprotein for surveillance for hepatocellular carcinoma.  It was not recommended to have a 46-month follow-up with me if he is doing well.  If he is not doing well please let me know. I am including my office manager due to your concerns. ----- Message ----- From: Kemper Durie, RN Sent: 10/30/2022  12:24 PM EDT To: Midge Minium, MD  Good afternoon, Mrs. Drace has been trying to schedule a 6 month follow up for Mr. Donia with your office.  She was told the June schedule wasn't available several weeks ago but has not been able to speak with anyone in the office since then.  Both she and I have called the office but had to leave a message with no call back.  Could someone call her to schedule this appointment please?  There are no urgent needs, she was just trying to plan ahead. Thanks, Kemper Durie, RN, MSN, [facility] Care Management Care Management Coordinator [phone]

## 2022-11-01 NOTE — Telephone Encounter (Signed)
Spoke to significant other, ok per DPR... OV needed annually and [place] with AFP needed every 6 months... [place] scheduled for 12/2022  Reminder set for annual visit and 6 mth repeat US and AFP from the 12/25/22 [place] scheduled

## 2022-11-17 ENCOUNTER — Ambulatory Visit: Payer: Self-pay | Admitting: *Deleted

## 2022-11-17 NOTE — Patient Outreach (Signed)
  Care Coordination   Follow Up Visit Note   11/17/2022 Name: ADLAI SINNING MRN: [id number] DOB: [birth year]  [name] is a 82 y.o. year old male who sees Smitty Cords, DO for primary care. I spoke with Meriam Sprague, wife of WESTEN DININO by phone today.  What matters to the patients health and wellness today?  Per wife, patient is doing well, would like to continue to keep cirrhosis controlled.     Goals Addressed             This Visit's Progress    RNCM: Effective Management of Cirrhosis   On track    Care Coordination Interventions: Evaluation of current treatment plan related to cirrhosis of the  and patient's adherence to plan as established by provider.  Advised patient to monitor for weight gain, shortness of breath, changes in memory, and other sx and sx of worsening conditions associated with cirrhosis of the liver. Review of worsening sx and sx of condition and review of how to respond if the patient  has acute onset of weight gain and shortness of breath.  Provided education regarding management of cirrhosis Reviewed medications with patient and discussed compliance. Reviewed scheduled/upcoming provider appointments Discussed plans with patient for ongoing care management follow up and provided patient with direct contact information for care management team             SDOH assessments and interventions completed:  No     Care Coordination Interventions:  Yes, provided   Interventions Today    Flowsheet Row Most Recent Value  Chronic Disease   Chronic disease during today's visit Other  [cirrhosis]  General Interventions   General Interventions Discussed/Reviewed General Interventions Reviewed, Doctor Visits  Doctor Visits Discussed/Reviewed Doctor Visits Reviewed, Specialist  [Ultrasound with AFP scheduled for 6/3]  PCP/Specialist Visits Compliance with follow-up visit  Education Interventions   Education Provided Provided Education  Provided  Verbal Education On When to see the doctor  [Advised per GI, does not need to see doctor every 6 month but rather every year unless there are issues.  Need [place] with AFP every 6 months]       Follow up plan: Follow up call scheduled for 6/5    Encounter Outcome:  Pt. Visit Completed   Kemper Durie, RN, MSN, [facility] Care Management Care Management Coordinator [phone]

## 2022-12-20 DIAGNOSIS — K7031 Alcoholic cirrhosis of liver with ascites: Secondary | ICD-10-CM | POA: Diagnosis not present

## 2022-12-20 NOTE — Addendum Note (Signed)
Addended by: Roena Malady on: 12/20/2022 01:35 PM   Modules accepted: Orders

## 2022-12-21 LAB — AFP TUMOR MARKER: AFP, Serum, Tumor Marker: <1.8 ng/mL (ref 0.0–8.4)

## 2022-12-22 ENCOUNTER — Encounter: Payer: Self-pay | Admitting: Gastroenterology

## 2022-12-25 ENCOUNTER — Ambulatory Visit
Admission: RE | Admit: 2022-12-25 | Discharge: 2022-12-25 | Disposition: A | Discharge status: Home / Self Care | Payer: Medicare PPO | Source: Ambulatory Visit | Attending: Gastroenterology | Admitting: Gastroenterology

## 2022-12-25 DIAGNOSIS — K7031 Alcoholic cirrhosis of liver with ascites: Secondary | ICD-10-CM | POA: Insufficient documentation

## 2022-12-25 DIAGNOSIS — K7460 Unspecified cirrhosis of liver: Secondary | ICD-10-CM | POA: Diagnosis not present

## 2022-12-27 ENCOUNTER — Ambulatory Visit: Payer: Self-pay | Admitting: *Deleted

## 2022-12-27 NOTE — Patient Outreach (Signed)
  Care Coordination   Follow Up Visit Note   12/27/2022 Name: Craig [name] MRN: [id number] DOB: 05/08/1941  Craig [name] is a 82 y.o. year old male who sees Smitty Cords, DO for primary care. I spoke with wife of Craig [name] by phone today.  What matters to the patients health and wellness today?  Ongoing management of cirrhosis.    Goals Addressed             This Visit's Progress    RNCM: Effective Management of Cirrhosis   On track    Care Coordination Interventions: Evaluation of current treatment plan related to cirrhosis of the  and patient's adherence to plan as established by provider.  Advised patient to monitor for weight gain, shortness of breath, changes in memory, and other sx and sx of worsening conditions associated with cirrhosis of the liver. Review of worsening sx and sx of condition and review of how to respond if the patient  has acute onset of weight gain and shortness of breath.  Provided education regarding management of cirrhosis Reviewed medications with patient and discussed compliance. Reviewed scheduled/upcoming provider appointments Discussed plans with patient for ongoing care management follow up and provided patient with direct contact information for care management team             SDOH assessments and interventions completed:  No     Care Coordination Interventions:  Yes, provided   Interventions Today    Flowsheet Row Most Recent Value  Chronic Disease   Chronic disease during today's visit Other  [liver cirrhosis]  General Interventions   General Interventions Discussed/Reviewed Doctor Visits, General Interventions Reviewed  Doctor Visits Discussed/Reviewed PCP, Doctor Visits Reviewed  [Liver tumor markers are reported good, no GI follow up for 6 months.  PCP follow up on 6/18]  PCP/Specialist Visits Compliance with follow-up visit  Education Interventions   Education Provided Provided Education  Provided Verbal  Education On Other, Medication  [Wife will continue current regime with nutrition and medication management as patient is doing well]       Follow up plan: Follow up call scheduled for 9/2    Encounter Outcome:  Pt. Visit Completed   Kemper Durie, RN, MSN, [facility] Care Management Care Management Coordinator [phone]

## 2023-01-09 ENCOUNTER — Ambulatory Visit: Payer: Medicare PPO | Admitting: Family Medicine

## 2023-01-17 ENCOUNTER — Ambulatory Visit: Payer: Medicare PPO | Admitting: Family Medicine

## 2023-01-31 ENCOUNTER — Other Ambulatory Visit: Payer: Self-pay | Admitting: Family Medicine

## 2023-01-31 DIAGNOSIS — I10 Essential (primary) hypertension: Secondary | ICD-10-CM

## 2023-01-31 NOTE — Telephone Encounter (Signed)
Requested Prescriptions  Pending Prescriptions Disp Refills   metoprolol succinate (TOPROL-XL) 25 MG 24 hr tablet [Pharmacy Med Name: METOPROLOL SUCCINATE ER 25 MG Tablet Extended Release 24 Hour] 90 tablet 0    Sig: TAKE 1 TABLET EVERY DAY     Cardiovascular:  Beta Blockers Passed - 01/31/2023  3:44 AM      Passed - Last BP in normal range    BP Readings from Last 1 Encounters:  08/14/22 126/70         Passed - Last Heart Rate in normal range    Pulse Readings from Last 1 Encounters:  08/14/22 83         Passed - Valid encounter within last 6 months    Recent Outpatient Visits           5 months ago Annual physical exam   [place] [facility] Smitty Cords, DO   11 months ago Alcoholic cirrhosis of liver with ascites [facility])   [facility] Smitty Cords, DO   1 year ago Alcoholic cirrhosis of liver with ascites [facility])   [facility] Smitty Cords, DO   1 year ago Lymphedema of both lower extremities   [facility] Smitty Cords, DO   1 year ago Annual physical exam   [facility] Smitty Cords, DO       Future Appointments             In 1 week Althea Charon, Netta Neat, DO [place] [facility], [facility]

## 2023-02-07 ENCOUNTER — Ambulatory Visit: Payer: Medicare PPO | Admitting: Family Medicine

## 2023-02-07 ENCOUNTER — Other Ambulatory Visit: Payer: Self-pay | Admitting: Family Medicine

## 2023-02-07 VITALS — BP 130/74 | HR 70 | Ht 75.0 in | Wt 264.0 lb

## 2023-02-07 DIAGNOSIS — I10 Essential (primary) hypertension: Secondary | ICD-10-CM | POA: Diagnosis not present

## 2023-02-07 DIAGNOSIS — I4819 Other persistent atrial fibrillation: Secondary | ICD-10-CM

## 2023-02-07 DIAGNOSIS — G3184 Mild cognitive impairment, so stated: Secondary | ICD-10-CM

## 2023-02-07 DIAGNOSIS — K7030 Alcoholic cirrhosis of liver without ascites: Secondary | ICD-10-CM | POA: Diagnosis not present

## 2023-02-07 DIAGNOSIS — E782 Mixed hyperlipidemia: Secondary | ICD-10-CM

## 2023-02-07 DIAGNOSIS — I2699 Other pulmonary embolism without acute cor pulmonale: Secondary | ICD-10-CM | POA: Diagnosis not present

## 2023-02-07 DIAGNOSIS — I890 Lymphedema, not elsewhere classified: Secondary | ICD-10-CM

## 2023-02-07 DIAGNOSIS — Z0000 Encounter for general adult medical examination without abnormal findings: Secondary | ICD-10-CM

## 2023-02-07 DIAGNOSIS — R7309 Other abnormal glucose: Secondary | ICD-10-CM

## 2023-02-07 MED ORDER — DONEPEZIL HCL 5 MG PO TBDP
5.0000 mg | ORAL_TABLET | Freq: Every day | ORAL | 1 refills | Status: DC
Start: 2023-02-07 — End: 2023-07-05

## 2023-02-07 NOTE — Assessment & Plan Note (Addendum)
Stable AFib persistent On rate control and anticoagulation

## 2023-02-07 NOTE — Patient Instructions (Addendum)
Thank you for coming to the office today.  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  DUE for FASTING BLOOD WORK (no food or drink after midnight before the lab appointment, only water or coffee without cream/sugar on the morning of)  SCHEDULE "Lab Only" visit in the morning at the clinic for lab draw in 6 MONTHS   - Make sure Lab Only appointment is at about 1 week before your next appointment, so that results will be available  For Lab Results, once available within 2-3 days of blood draw, you can can log in to MyChart online to view your results and a brief explanation. Also, we can discuss results at next follow-up visit.   Please schedule a Follow-up Appointment to: Return in about 6 months (around 08/10/2023) for 6 month fasting lab only then 1 week later Annual Physical.  If you have any other questions or concerns, please feel free to call the office or send a message through MyChart. You may also schedule an earlier appointment if necessary.  Additionally, you may be receiving a survey about your experience at our office within a few days to 1 week by e-mail or mail. We value your feedback.  Saralyn Pilar, DO [facility], [place]

## 2023-02-07 NOTE — Assessment & Plan Note (Signed)
Stable, on chronic anticoagulation lifelong due to b/l DVT and recurrent PE - No new recurrence or concerns - Continue Xarelto 

## 2023-02-07 NOTE — Assessment & Plan Note (Signed)
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 Continue current BP regimen Metoprolol XL 25mg  daily 2. Encourage improved lifestyle - low sodium diet, improve regular exercise 3. Continue monitor BP outside office, bring readings to next visit, if persistently >140/90 or new symptoms notify office sooner

## 2023-02-07 NOTE — Progress Notes (Signed)
Subjective:    Patient ID: Craig [name], male    DOB: [birth year], 82 y.o.   MRN: [id number]  Craig [name] is a 82 y.o. 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  Cirrhosis, Alcoholic Followed by Dr Servando Snare [facility] On surveillance AFP lab every 6 months and Ultrasound every 6 months, last testing negative.   Cardiology Permanent Atrial Fibrillation Followed by EP Dr [facility] He is managed on Metoprolol XL for rate control and Xarelto for anticoagulation They did not recommend invasive EP procedure at this time.   Hypertension Low BP lately He remains off Losartan and Amlodipine Continues on Metoprolol for rate.   Gynecomastia - improved, off Spironolactone   Mild Cognitive Impairment with Memory loss Previously w hospitalization and liver disease had worse confusion, now improved. 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 He sleeps during the day with naps. Long term problem.   Recurrent PE/DVT, on anticoagulation lifelong Reports no new problem. Continues to take Xarelto daily for anticoagulation     Health Maintenance: Update Pneumonia vaccine to Prevnar20 Due for Flu Shot  today.      08/14/2022   11:01 AM 12/23/2021    3:32 PM 12/08/2021   12:09 PM  Depression screen PHQ 2/9  Decreased Interest 2 1 1   Down, Depressed, Hopeless 3 0 0  PHQ - 2 Score 5 1 1   Altered sleeping 3 3   Tired, decreased energy 3 3   Change in appetite 3 1   Feeling bad or failure about yourself  3 1   Trouble concentrating 3 3   Moving slowly or fidgety/restless 1 1   Suicidal thoughts 1 0   PHQ-9 Score 22 13   Difficult doing work/chores Somewhat difficult Somewhat difficult     Social History   Tobacco Use   Smoking status: Former    Current packs/day: 0.00    Types: Cigarettes    Start date: 07/24/1958    Quit date: 07/24/1978    Years since quitting: 44.5   Smokeless tobacco: Former   Tobacco comments:    Quit tobacco in 1980  Vaping Use   Vaping status: Never Used  Substance Use Topics   Alcohol use: Not Currently    Alcohol/week: 3.0 standard drinks of alcohol    Types: 3 Shots of liquor per week    Comment: daily   Drug use: No    Review of Systems Per HPI unless specifically indicated above     Objective:    BP 130/74   Pulse 70   Ht 6\' 3"  (1.905 m)   Wt 264 lb (119.7 kg)   SpO2 93%   BMI 33.00 kg/m   Wt Readings from Last 3 Encounters:  02/07/23 264 lb (119.7 kg)  08/14/22 244 lb  3.2 oz (110.8 kg)  07/03/22 242 lb (109.8 kg)    Physical Exam Vitals and nursing note reviewed.  Constitutional:      General: He is not in acute distress.    Appearance: Normal appearance. He is well-developed. He is obese. He is not diaphoretic.     Comments: Well-appearing, comfortable, cooperative  HENT:     Head: Normocephalic and atraumatic.  Eyes:     General:        Right eye: No discharge.        Left eye: No discharge.     Conjunctiva/sclera: Conjunctivae normal.  Cardiovascular:     Rate and Rhythm: Normal rate.  Pulmonary:     Effort: Pulmonary effort is normal.  Musculoskeletal:     Right lower leg: Edema present.     Left lower leg: Edema  present.  Skin:    General: Skin is warm and dry.     Findings: No erythema or rash.  Neurological:     Mental Status: He is alert and oriented to person, place, and time.  Psychiatric:        Mood and Affect: Mood normal.        Behavior: Behavior normal.        Thought Content: Thought content normal.     Comments: Well groomed, good eye contact, normal speech and thoughts     I have personally reviewed the radiology report from 12/25/22 on [place] RUQ Abdomen.  CLINICAL DATA:  Cirrhosis   EXAM: ULTRASOUND ABDOMEN LIMITED RIGHT UPPER QUADRANT   COMPARISON:  None Available.   FINDINGS: Gallbladder:   Apparent 1.5 cm stone in the neck of the gallbladder. No wall thickening or Murphy's sign. No other abnormalities.   Common bile duct:   Diameter: 4 mm   Liver:   Increased echogenicity. Mildly nodular contour. No mass. Portal vein is patent on color Doppler imaging with normal direction of blood flow towards the liver.   Other: None.   IMPRESSION: 1. 1.5 cm stone in the neck of the gallbladder. The gallbladder is otherwise normal. 2. Cirrhotic liver.  No mass identified. 3. No other abnormalities.     Electronically Signed   By: Gerome Sam III M.D.   On: 12/25/2022 11:34 ----------------------------------------------------   I have personally reviewed the radiology report from 04/06/21 Lumbar MRI.  CLINICAL DATA:  Low back pain   EXAM: MRI LUMBAR SPINE WITHOUT CONTRAST   TECHNIQUE: Multiplanar, multisequence MR imaging of the lumbar spine was performed. No intravenous contrast was administered.   COMPARISON:  None.   FINDINGS: Segmentation:  Standard.   Alignment:  Physiologic.   Vertebrae:  No fracture, evidence of discitis, or bone lesion.   Conus medullaris and cauda equina: Conus extends to the L1 level. Conus and cauda equina appear normal.   Paraspinal and other soft tissues: Negative   Disc levels:   L1-L2: Normal disc space and facet  joints. No spinal canal stenosis. No neural foraminal stenosis.   L2-L3: Normal disc space and facet joints. No spinal canal stenosis. No neural foraminal stenosis.   L3-L4: Normal disc space and facet joints. No spinal canal stenosis. No neural foraminal stenosis.   L4-L5: Right asymmetric disc bulge with mild facet hypertrophy. No spinal canal stenosis. Moderate right neural foraminal stenosis.   L5-S1: Disc space narrowing with right asymmetric bulge and endplate spurring. No spinal canal stenosis. Mild bilateral neural foraminal stenosis.   Visualized sacrum: Normal.   IMPRESSION: 1. Moderate right L4-5  neural foraminal stenosis due to right asymmetric disc bulge and facet arthrosis. 2. Mild bilateral L5-S1 neural foraminal stenosis. 3. No spinal canal stenosis.     Electronically Signed   By: Deatra Robinson M.D.   On: 04/06/2021 23:04   Results for orders placed or performed in visit on 11/01/22  AFP tumor marker  Result Value Ref Range   AFP, Serum, Tumor Marker <1.8 0.0 - 8.4 ng/mL      Assessment & Plan:   Problem List Items Addressed This Visit     Essential hypertension (Chronic)    Controlled - Home BP readings normal  Complication with h/o Grade 2 diastolic dysfunction > now on repeat ECHO normal LVEF and diastolic fxn Followed by [facility]  Off Amlodipine, Losartan  Plan:  1. Continue current BP regimen Metoprolol XL 25mg  daily 2. Encourage improved lifestyle - low sodium diet, improve regular exercise 3. Continue monitor BP outside office, bring readings to next visit, if persistently >140/90 or new symptoms notify office sooner      Lymphedema of both lower extremities   Persistent atrial fibrillation (HCC)    Stable AFib persistent On rate control and anticoagulation      Recurrent pulmonary emboli (HCC)    Stable, on chronic anticoagulation lifelong due to b/l DVT and recurrent PE - No new recurrence or concerns - Continue Xarelto       Other Visit Diagnoses     Alcoholic cirrhosis of liver without ascites (HCC)    -  Primary   Mild cognitive impairment with memory loss       Relevant Medications   donepezil (ARICEPT ODT) 5 MG disintegrating tablet       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  Mild Cognitive Impairment Discussion on options. Start Aricept Donepezil 5mg  dissolving tablet 90 day order to [employer] Order, let me know how it goes within 90 days and we can adjust the dose in future or consider other options Reconsider Prevagen if interested in future. Consider Memantine / Namenda if indicated in future if aricept not effective    Meds ordered this encounter  Medications   donepezil (ARICEPT ODT) 5 MG disintegrating tablet    Sig: Take 1 tablet (5 mg total) by mouth at bedtime.    Dispense:  90 tablet    Refill:  1      Follow up plan: Return in about 6 months (around 08/10/2023) for 6 month fasting lab only then 1 week later Annual Physical.  Future labs ordered for 08/03/23   Saralyn Pilar, DO [facility] 02/07/2023, 1:25 PM

## 2023-02-10 ENCOUNTER — Other Ambulatory Visit: Payer: Self-pay | Admitting: Family Medicine

## 2023-02-10 DIAGNOSIS — E785 Hyperlipidemia, unspecified: Secondary | ICD-10-CM

## 2023-02-10 DIAGNOSIS — I2699 Other pulmonary embolism without acute cor pulmonale: Secondary | ICD-10-CM

## 2023-02-12 NOTE — Telephone Encounter (Signed)
Requested medication (s) are due for refill today - yes  Requested medication (s) are on the active medication list -yes  Future visit scheduled -yes  Last refill: 02/13/22 #90 3RF  Notes to clinic: fails lab protocol- over 1 year-08/10/21  Requested Prescriptions  Pending Prescriptions Disp Refills   atorvastatin (LIPITOR) 40 MG tablet [Pharmacy Med Name: ATORVASTATIN CALCIUM 40 MG Tablet] 90 tablet 3    Sig: TAKE 1 TABLET EVERY DAY     Cardiovascular:  Antilipid - Statins Failed - 02/10/2023  4:03 AM      Failed - Lipid Panel in normal range within the last 12 months    Cholesterol, Total  Date Value Ref Range Status  06/24/2019 181 100 - 199 mg/dL Final   Cholesterol  Date Value Ref Range Status  08/10/2021 126 <200 mg/dL Final   LDL Cholesterol (Calc)  Date Value Ref Range Status  08/10/2021 35 mg/dL (calc) Final    Comment:    Reference range: <100 . Desirable range <100 mg/dL for primary prevention;   <70 mg/dL for patients with CHD or diabetic patients  with > or = 2 CHD risk factors. Marland Kitchen LDL-C is now calculated using the Martin-Hopkins  calculation, which is a validated novel method providing  better accuracy than the Friedewald equation in the  estimation of LDL-C.  Horald Pollen et al. Lenox Ahr. 1610;960(45): 2061-2068  (http://education.[facility].com/faq/FAQ164)    HDL  Date Value Ref Range Status  08/10/2021 68 > OR = 40 mg/dL Final  40/98/1191 478 >29 mg/dL Final   Triglycerides  Date Value Ref Range Status  08/10/2021 146 <150 mg/dL Final         Passed - Patient is not pregnant      Passed - Valid encounter within last 12 months    Recent Outpatient Visits           5 days ago Alcoholic cirrhosis of liver without ascites [facility])   [place] [facility] Althea Charon, Netta Neat, DO   6 months ago Annual physical exam   [place] [facility] Smitty Cords, DO   11 months ago Alcoholic cirrhosis of  liver with ascites [facility])   [place] [facility] Smitty Cords, DO   1 year ago Alcoholic cirrhosis of liver with ascites [facility])   [place] [facility] Smitty Cords, DO   1 year ago Lymphedema of both lower extremities   [place] [facility] [place], Netta Neat, DO       Future Appointments             In 5 months Althea Charon, Netta Neat, DO  [facility]            Signed Prescriptions Disp Refills   XARELTO 20 MG TABS tablet 90 tablet 3    Sig: TAKE 1 TABLET (20 MG TOTAL) BY MOUTH DAILY WITH SUPPER.     Hematology: Anticoagulants - rivaroxaban Passed - 02/10/2023  4:03 AM      Passed - ALT in normal range and within 360 days    ALT  Date Value Ref Range Status  06/21/2022 8 0 - 44 U/L Final   SGPT (ALT)  Date Value Ref Range Status  07/25/2014 16 U/L Final    Comment:    14-63 NOTE: New Reference Range 02/10/14          Passed - AST in normal range and within  360 days    AST  Date Value Ref Range Status  06/21/2022 16 15 - 41 U/L Final   SGOT(AST)  Date Value Ref Range Status  07/25/2014 29 15 - 37 Unit/L Final         Passed - Cr in normal range and within 360 days    Creat  Date Value Ref Range Status  12/28/2021 0.72 0.70 - 1.22 mg/dL Final   Creatinine, Ser  Date Value Ref Range Status  06/21/2022 0.81 0.61 - 1.24 mg/dL Final         Passed - HCT in normal range and within 360 days    HCT  Date Value Ref Range Status  06/21/2022 42.7 39.0 - 52.0 % Final   Hematocrit  Date Value Ref Range Status  06/24/2019 40.4 37.5 - 51.0 % Final         Passed - HGB in normal range and within 360 days    Hemoglobin  Date Value Ref Range Status  06/21/2022 13.8 13.0 - 17.0 g/dL Final  34/74/2595 63.8 13.0 - 17.7 g/dL Final         Passed - PLT in normal range and within 360 days    Platelets  Date Value Ref Range Status  06/21/2022 241  150 - 400 K/uL Final  06/24/2019 215 150 - 450 x10E3/uL Final         Passed - eGFR is 15 or above and within 360 days    GFR, Est African American  Date Value Ref Range Status  07/12/2020 97 > OR = 60 mL/min/1.62m2 Final   GFR, Est Non African American  Date Value Ref Range Status  07/12/2020 84 > OR = 60 mL/min/1.60m2 Final   GFR, Estimated  Date Value Ref Range Status  06/21/2022 >60 >60 mL/min Final    Comment:    (NOTE) Calculated using the CKD-EPI Creatinine Equation (2021)    eGFR  Date Value Ref Range Status  12/28/2021 92 > OR = 60 mL/min/1.82m2 Final    Comment:    The eGFR is based on the CKD-EPI 2021 equation. To calculate  the new eGFR from a previous Creatinine or Cystatin C result, go to https://www.kidney.org/professionals/ kdoqi/gfr%5Fcalculator          Passed - Patient is not pregnant      Passed - Valid encounter within last 12 months    Recent Outpatient Visits           5 days ago Alcoholic cirrhosis of liver without ascites [facility])   [place] [facility] [place], Netta Neat, DO   6 months ago Annual physical exam   [place] [facility] Smitty Cords, DO   11 months ago Alcoholic cirrhosis [facility] liver [facility])   [place] [facility] Smitty Cords, DO   1 year ago Alcoholic cirrhosis of liver with ascites [facility])   [place] [facility] Smitty Cords, DO   1 year ago Lymphedema of both lower extremities   [place] [facility] Smitty Cords, DO       Future Appointments             In 5 months Althea Charon, Netta Neat, DO [place] [facility]               Requested Prescriptions  Pending Prescriptions Disp Refills   atorvastatin (LIPITOR) 40 MG tablet [Pharmacy Med  Name: ATORVASTATIN CALCIUM 40 MG Tablet] 90 tablet 3    Sig: TAKE 1 TABLET EVERY DAY      Cardiovascular:  Antilipid - Statins Failed - 02/10/2023  4:03 AM      Failed - Lipid Panel in normal range within the last 12 months    Cholesterol, Total  Date Value Ref Range Status  06/24/2019 181 100 - 199 mg/dL Final   Cholesterol  Date Value Ref Range Status  08/10/2021 126 <200 mg/dL Final   LDL Cholesterol (Calc)  Date Value Ref Range Status  08/10/2021 35 mg/dL (calc) Final    Comment:    Reference range: <100 . Desirable range <100 mg/dL for primary prevention;   <70 mg/dL for patients with CHD or diabetic patients  with > or = 2 CHD risk factors. Marland Kitchen LDL-C is now calculated using the Martin-Hopkins  calculation, which is a validated novel method providing  better accuracy than the Friedewald equation in the  estimation of LDL-C.  Horald Pollen et al. Lenox Ahr. 9528;413(24): 2061-2068  (http://education.[facility].com/faq/FAQ164)    HDL  Date Value Ref Range Status  08/10/2021 68 > OR = 40 mg/dL Final  40/04/2724 366 >44 mg/dL Final   Triglycerides  Date Value Ref Range Status  08/10/2021 146 <150 mg/dL Final         Passed - Patient is not pregnant      Passed - Valid encounter within last 12 months    Recent Outpatient Visits           5 days ago Alcoholic cirrhosis of liver without ascites [facility])   [place] [facility] Althea Charon, Netta Neat, DO   6 months ago Annual physical exam   [place] [facility] Smitty Cords, DO   11 months ago Alcoholic cirrhosis of liver with ascites [facility])   [place] [facility] Smitty Cords, DO   1 year ago Alcoholic cirrhosis of liver with ascites [facility])   [place] [facility] Smitty Cords, DO   1 year ago Lymphedema of both lower extremities   [place] [facility] [place], Netta Neat, DO       Future Appointments             In 5 months Althea Charon, Netta Neat, DO [place] [facility]            Signed Prescriptions Disp Refills   XARELTO 20 MG TABS tablet 90 tablet 3    Sig: TAKE 1 TABLET (20 MG TOTAL) BY MOUTH DAILY WITH SUPPER.     Hematology: Anticoagulants - rivaroxaban Passed - 02/10/2023  4:03 AM      Passed - ALT in normal range and within 360 days    ALT  Date Value Ref Range Status  06/21/2022 8 0 - 44 U/L Final   SGPT (ALT)  Date Value Ref Range Status  07/25/2014 16 U/L Final    Comment:    14-63 NOTE: New Reference Range 02/10/14          Passed - AST in normal range and within 360 days    AST  Date Value Ref Range Status  06/21/2022 16 15 - 41 U/L Final   SGOT(AST)  Date Value Ref Range Status  07/25/2014 29 15 - 37 Unit/L Final         Passed - Cr in normal range and within 360 days  Creat  Date Value Ref Range Status  12/28/2021 0.72 0.70 - 1.22 mg/dL Final   Creatinine, Ser  Date Value Ref Range Status  06/21/2022 0.81 0.61 - 1.24 mg/dL Final         Passed - HCT in normal range and within 360 days    HCT  Date Value Ref Range Status  06/21/2022 42.7 39.0 - 52.0 % Final   Hematocrit  Date Value Ref Range Status  06/24/2019 40.4 37.5 - 51.0 % Final         Passed - HGB in normal range and within 360 days    Hemoglobin  Date Value Ref Range Status  06/21/2022 13.8 13.0 - 17.0 g/dL Final  91/47/8295 62.1 13.0 - 17.7 g/dL Final         Passed - PLT in normal range and within 360 days    Platelets  Date Value Ref Range Status  06/21/2022 241 150 - 400 K/uL Final  06/24/2019 215 150 - 450 x10E3/uL Final         Passed - eGFR is 15 or above and within 360 days    GFR, Est African American  Date Value Ref Range Status  07/12/2020 97 > OR = 60 mL/min/1.72m2 Final   GFR, Est Non African American  Date Value Ref Range Status  07/12/2020 84 > OR = 60 mL/min/1.24m2 Final   GFR, Estimated  Date Value Ref Range Status  06/21/2022 >60 >60 mL/min Final    Comment:    (NOTE) Calculated  using the CKD-EPI Creatinine Equation (2021)    eGFR  Date Value Ref Range Status  12/28/2021 92 > OR = 60 mL/min/1.10m2 Final    Comment:    The eGFR is based on the CKD-EPI 2021 equation. To calculate  the new eGFR from a previous Creatinine or Cystatin C result, go to https://www.kidney.org/professionals/ kdoqi/gfr%5Fcalculator          Passed - Patient is not pregnant      Passed - Valid encounter within last 12 months    Recent Outpatient Visits           5 days ago Alcoholic cirrhosis of liver without ascites [facility])   [place] [facility] [place], Netta Neat, DO   6 months ago Annual physical exam   [place] [facility] Smitty Cords, DO   11 months ago Alcoholic cirrhosis of liver with ascites [facility])   [place] [facility] Smitty Cords, DO   1 year ago Alcoholic cirrhosis of liver with ascites [facility])   [place] [facility] Smitty Cords, DO   1 year ago Lymphedema of both lower extremities   [place] [facility] Smitty Cords, DO       Future Appointments             In 5 months Althea Charon, Netta Neat, DO [place] [facility]

## 2023-02-12 NOTE — Telephone Encounter (Signed)
Requested Prescriptions  Pending Prescriptions Disp Refills   XARELTO 20 MG TABS tablet [Pharmacy Med Name: XARELTO 20 MG Tablet] 90 tablet 3    Sig: TAKE 1 TABLET (20 MG TOTAL) BY MOUTH DAILY WITH SUPPER.     Hematology: Anticoagulants - rivaroxaban Passed - 02/10/2023  4:03 AM      Passed - ALT in normal range and within 360 days    ALT  Date Value Ref Range Status  06/21/2022 8 0 - 44 U/L Final   SGPT (ALT)  Date Value Ref Range Status  07/25/2014 16 U/L Final    Comment:    14-63 NOTE: New Reference Range 02/10/14          Passed - AST in normal range and within 360 days    AST  Date Value Ref Range Status  06/21/2022 16 15 - 41 U/L Final   SGOT(AST)  Date Value Ref Range Status  07/25/2014 29 15 - 37 Unit/L Final         Passed - Cr in normal range and within 360 days    Creat  Date Value Ref Range Status  12/28/2021 0.72 0.70 - 1.22 mg/dL Final   Creatinine, Ser  Date Value Ref Range Status  06/21/2022 0.81 0.61 - 1.24 mg/dL Final         Passed - HCT in normal range and within 360 days    HCT  Date Value Ref Range Status  06/21/2022 42.7 39.0 - 52.0 % Final   Hematocrit  Date Value Ref Range Status  06/24/2019 40.4 37.5 - 51.0 % Final         Passed - HGB in normal range and within 360 days    Hemoglobin  Date Value Ref Range Status  06/21/2022 13.8 13.0 - 17.0 g/dL Final  05/20/2535 64.4 13.0 - 17.7 g/dL Final         Passed - PLT in normal range and within 360 days    Platelets  Date Value Ref Range Status  06/21/2022 241 150 - 400 K/uL Final  06/24/2019 215 150 - 450 x10E3/uL Final         Passed - eGFR is 15 or above and within 360 days    GFR, Est African American  Date Value Ref Range Status  07/12/2020 97 > OR = 60 mL/min/1.33m2 Final   GFR, Est Non African American  Date Value Ref Range Status  07/12/2020 84 > OR = 60 mL/min/1.22m2 Final   GFR, Estimated  Date Value Ref Range Status  06/21/2022 >60 >60 mL/min Final     Comment:    (NOTE) Calculated using the CKD-EPI Creatinine Equation (2021)    eGFR  Date Value Ref Range Status  12/28/2021 92 > OR = 60 mL/min/1.35m2 Final    Comment:    The eGFR is based on the CKD-EPI 2021 equation. To calculate  the new eGFR from a previous Creatinine or Cystatin C result, go to https://www.kidney.org/professionals/ kdoqi/gfr%5Fcalculator          Passed - Patient is not pregnant      Passed - Valid encounter within last 12 months    Recent Outpatient Visits           5 days ago Alcoholic cirrhosis of liver without ascites [facility])   [place] [facility] [place], Netta Neat, DO   6 months ago Annual physical exam   [facility] Smitty Cords, [place]   03  months ago Alcoholic cirrhosis of liver with ascites [facility])   [place] [facility] [place], Netta Neat, DO   1 year ago Alcoholic cirrhosis of liver with ascites [facility])   [place] [facility] Smitty Cords, DO   1 year ago Lymphedema of both lower extremities   [place] [facility] [place], Netta Neat, DO       Future Appointments             In 5 months Althea Charon, Netta Neat, DO [facility]             atorvastatin (LIPITOR) 40 MG tablet [Pharmacy Med Name: ATORVASTATIN CALCIUM 40 MG Tablet] 90 tablet 3    Sig: TAKE 1 TABLET EVERY DAY     Cardiovascular:  Antilipid - Statins Failed - 02/10/2023  4:03 AM      Failed - Lipid Panel in normal range within the last 12 months    Cholesterol, Total  Date Value Ref Range Status  06/24/2019 181 100 - 199 mg/dL Final   Cholesterol  Date Value Ref Range Status  08/10/2021 126 <200 mg/dL Final   LDL Cholesterol (Calc)  Date Value Ref Range Status  08/10/2021 35 mg/dL (calc) Final    Comment:    Reference range: <100 . Desirable range <100 mg/dL for primary prevention;   <70  mg/dL for patients with CHD or diabetic patients  with > or = 2 CHD risk factors. Marland Kitchen LDL-C is now calculated using the Martin-Hopkins  calculation, which is a validated novel method providing  better accuracy than the Friedewald equation in the  estimation of LDL-C.  Horald Pollen et al. Lenox Ahr. 4010;272(53): 2061-2068  (http://education.[facility].com/faq/FAQ164)    HDL  Date Value Ref Range Status  08/10/2021 68 > OR = 40 mg/dL Final  66/44/0347 425 >95 mg/dL Final   Triglycerides  Date Value Ref Range Status  08/10/2021 146 <150 mg/dL Final         Passed - Patient is not pregnant      Passed - Valid encounter within last 12 months    Recent Outpatient Visits           5 days ago Alcoholic cirrhosis of liver without ascites [facility])   [place] [facility] Althea Charon, Netta Neat, DO   6 months ago Annual physical exam   [facility] Smitty Cords, DO   11 months ago Alcoholic cirrhosis of liver with ascites [facility])   [place] [facility] Smitty Cords, DO   1 year ago Alcoholic cirrhosis of liver with ascites [facility])   [place] [facility] Smitty Cords, DO   1 year ago Lymphedema of both lower extremities   [place] [facility] [place], Netta Neat, DO       Future Appointments             In 5 months Althea Charon, Netta Neat, DO  [facility]

## 2023-03-27 ENCOUNTER — Ambulatory Visit: Payer: Self-pay | Admitting: *Deleted

## 2023-03-27 NOTE — Patient Outreach (Signed)
  Care Coordination   Follow Up Visit Note   03/27/2023 Name: [name] MRN: [id number] DOB: 01-23-1941  Craig [name] is a 82 y.o. year old male who sees Smitty Cords, DO for primary care. I spoke with Meriam Sprague, wife of Craig [name] by phone today.  What matters to the patients health and wellness today?  Wife report patient has been doing well, working to keep him stable although there has been some decline due to age and mental capacity.  Overall, doing well.     Goals Addressed             This Visit's Progress    RNCM: Effective Management of Cirrhosis   On track    Care Coordination Interventions: Evaluation of current treatment plan related to cirrhosis of the  and patient's adherence to plan as established by provider.  Advised patient to monitor for weight gain, shortness of breath, changes in memory, and other sx and sx of worsening conditions associated with cirrhosis of the liver. Review of worsening sx and sx of condition and review of how to respond if the patient  has acute onset of weight gain and shortness of breath.  Reviewed medications with patient and discussed compliance. Reviewed scheduled/upcoming provider appointments Discussed plans with patient for ongoing care management follow up and provided patient with direct contact information for care management team             SDOH assessments and interventions completed:  No     Care Coordination Interventions:  Yes, provided   Interventions Today    Flowsheet Row Most Recent Value  Chronic Disease   Chronic disease during today's visit Other  [liver cirrhosis]  General Interventions   General Interventions Discussed/Reviewed General Interventions Reviewed, Labs, Doctor Visits  Labs --  [Has routine liver testing, every 6 months]  Doctor Visits Discussed/Reviewed Doctor Visits Reviewed, PCP  [Next PCP in 6 months]  PCP/Specialist Visits Compliance with follow-up visit  [Most recent PCP  on 7/17]  Exercise Interventions   Exercise Discussed/Reviewed Weight Managment  Weight Management Weight maintenance  Education Interventions   Education Provided Provided Education  Provided Verbal Education On Medication, When to see the doctor  [monitoring weight and blood pressure, watching for fluid accumulation in abdomen]       Follow up plan: Follow up call scheduled for 12/2    Encounter Outcome:  Pt. Visit Completed   Kemper Durie, RN, MSN, [facility] Care Management Care Management Coordinator [phone]

## 2023-04-14 ENCOUNTER — Other Ambulatory Visit: Payer: Self-pay | Admitting: Family Medicine

## 2023-04-14 DIAGNOSIS — I10 Essential (primary) hypertension: Secondary | ICD-10-CM

## 2023-04-16 NOTE — Telephone Encounter (Signed)
Requested Prescriptions  Pending Prescriptions Disp Refills   metoprolol succinate (TOPROL-XL) 25 MG 24 hr tablet [Pharmacy Med Name: Metoprolol Succinate ER Oral Tablet Extended Release 24 Hour 25 MG] 90 tablet 1    Sig: TAKE 1 TABLET EVERY DAY     Cardiovascular:  Beta Blockers Passed - 04/14/2023  3:29 AM      Passed - Last BP in normal range    BP Readings from Last 1 Encounters:  02/07/23 130/74         Passed - Last Heart Rate in normal range    Pulse Readings from Last 1 Encounters:  02/07/23 70         Passed - Valid encounter within last 6 months    Recent Outpatient Visits           2 months ago Alcoholic cirrhosis of liver without ascites [facility])   [place] [facility] [place], Netta Neat, DO   8 months ago Annual physical exam   [place] [facility] Smitty Cords, DO   1 year ago Alcoholic cirrhosis of liver with ascites [facility])   [place] [facility] Smitty Cords, DO   1 year ago Alcoholic cirrhosis of liver with ascites [facility])   [place] [facility] Smitty Cords, DO   1 year ago Lymphedema of both lower extremities   [place] [facility] [place], Netta Neat, DO       Future Appointments             In 3 months Althea Charon, Netta Neat, DO [place] [facility], [facility]

## 2023-04-27 ENCOUNTER — Other Ambulatory Visit: Payer: Self-pay | Admitting: Internal Medicine

## 2023-04-27 DIAGNOSIS — E785 Hyperlipidemia, unspecified: Secondary | ICD-10-CM

## 2023-04-27 NOTE — Telephone Encounter (Signed)
Requested Prescriptions  Pending Prescriptions Disp Refills   atorvastatin (LIPITOR) 40 MG tablet [Pharmacy Med Name: Atorvastatin Calcium Oral Tablet 40 MG] 90 tablet 0    Sig: TAKE 1 TABLET EVERY DAY     Cardiovascular:  Antilipid - Statins Failed - 04/27/2023  6:03 AM      Failed - Lipid Panel in normal range within the last 12 months    Cholesterol, Total  Date Value Ref Range Status  06/24/2019 181 100 - 199 mg/dL Final   Cholesterol  Date Value Ref Range Status  08/10/2021 126 <200 mg/dL Final   LDL Cholesterol (Calc)  Date Value Ref Range Status  08/10/2021 35 mg/dL (calc) Final    Comment:    Reference range: <100 . Desirable range <100 mg/dL for primary prevention;   <70 mg/dL for patients with CHD or diabetic patients  with > or = 2 CHD risk factors. Marland Kitchen LDL-C is now calculated using the Martin-Hopkins  calculation, which is a validated novel method providing  better accuracy than the Friedewald equation in the  estimation of LDL-C.  Horald Pollen et al. Lenox Ahr. 1610;960(45): 2061-2068  (http://education.[facility].com/faq/FAQ164)    HDL  Date Value Ref Range Status  08/10/2021 68 > OR = 40 mg/dL Final  40/98/1191 478 >29 mg/dL Final   Triglycerides  Date Value Ref Range Status  08/10/2021 146 <150 mg/dL Final         Passed - Patient is not pregnant      Passed - Valid encounter within last 12 months    Recent Outpatient Visits           2 months ago Alcoholic cirrhosis of liver without ascites [facility])   [place] [facility] Althea Charon, Netta Neat, DO   8 months ago Annual physical exam   [place] [facility] Smitty Cords, DO   1 year ago Alcoholic cirrhosis of liver with ascites [facility])   [place] [facility] Smitty Cords, DO   1 year ago Alcoholic cirrhosis of liver with ascites [facility])   [place] [facility] Smitty Cords, DO   1 year ago  Lymphedema of both lower extremities   [place] [facility] [place], Netta Neat, DO       Future Appointments             In 3 months Althea Charon, Netta Neat, DO  [facility], [facility]

## 2023-05-31 ENCOUNTER — Other Ambulatory Visit: Payer: Self-pay

## 2023-05-31 ENCOUNTER — Telehealth: Payer: Self-pay

## 2023-05-31 DIAGNOSIS — K7031 Alcoholic cirrhosis of liver with ascites: Secondary | ICD-10-CM

## 2023-05-31 NOTE — Telephone Encounter (Signed)
Pt's partner calling to check on when pt is due for [place] and repeat lab... [place] ordered, number given to schedule [place], f/u OV scheduled with Dr Servando Snare

## 2023-06-10 ENCOUNTER — Other Ambulatory Visit: Payer: Self-pay | Admitting: Family Medicine

## 2023-06-10 DIAGNOSIS — K219 Gastro-esophageal reflux disease without esophagitis: Secondary | ICD-10-CM

## 2023-06-11 NOTE — Telephone Encounter (Signed)
Requested Prescriptions  Refused Prescriptions Disp Refills   omeprazole (PRILOSEC) 40 MG capsule [Pharmacy Med Name: Omeprazole Oral Capsule Delayed Release 40 MG] 90 capsule 3    Sig: TAKE 1 CAPSULE EVERY DAY     Gastroenterology: Proton Pump Inhibitors Passed - 06/10/2023  7:06 AM      Passed - Valid encounter within last 12 months    Recent Outpatient Visits           4 months ago Alcoholic cirrhosis of liver without ascites [facility])   [place] [facility] Althea Charon, Netta Neat, DO   10 months ago Annual physical exam   [place] [facility] Smitty Cords, DO   1 year ago Alcoholic cirrhosis of liver with ascites [facility])   [place] [facility] Smitty Cords, DO   1 year ago Alcoholic cirrhosis of liver with ascites [facility])   [place] [facility] Smitty Cords, DO   1 year ago Lymphedema of both lower extremities   [place] [facility] [place], Netta Neat, DO       Future Appointments             In 3 weeks Midge Minium, MD [facility]   In 2 months Althea Charon, Netta Neat, DO [place] [facility], [facility]

## 2023-06-25 ENCOUNTER — Ambulatory Visit: Payer: Self-pay | Admitting: *Deleted

## 2023-06-25 NOTE — Patient Outreach (Signed)
  Care Coordination   Follow Up Visit Note   06/25/2023 Name: Craig [name] MRN: [id number] DOB: 29-Mar-1941  Craig [name] is a 82 y.o. year old male who sees Smitty Cords, DO for primary care. I spoke with Meriam Sprague, wife of TAKASHI ATALLAH by phone today.  What matters to the patients health and wellness today?  Wife report patient has been doing well, has not had any nausea/vomiting, or abdominal distention.  Continues to be independent at home, denies complaints.     Goals Addressed             This Visit's Progress    RNCM: Effective Management of Cirrhosis   On track    Care Coordination Interventions: Evaluation of current treatment plan related to cirrhosis of the  and patient's adherence to plan as established by provider.  Advised patient to monitor for weight gain, shortness of breath, changes in memory, and other sx and sx of worsening conditions associated with cirrhosis of the liver. Review of worsening sx and sx of condition and review of how to respond if the patient  has acute onset of weight gain and shortness of breath.  Reviewed medications with patient and discussed compliance. Reviewed scheduled/upcoming provider appointments Discussed plans with patient for ongoing care management follow up and provided patient with direct contact information for care management team             SDOH assessments and interventions completed:  No     Care Coordination Interventions:  Yes, provided   Interventions Today    Flowsheet Row Most Recent Value  Chronic Disease   Chronic disease during today's visit Other, Hypertension (HTN)  [cirrhosis of the liver]  General Interventions   General Interventions Discussed/Reviewed General Interventions Reviewed, Doctor Visits  Doctor Visits Discussed/Reviewed Doctor Visits Reviewed, PCP, Specialist  [upcoming for ultrasound on 12/4, GI on 12/12, and PCP on 1/17]  PCP/Specialist Visits Compliance with follow-up visit   Exercise Interventions   Exercise Discussed/Reviewed Weight Managment  Weight Management Weight maintenance  [wife denies swelling or significant weight gain]  Education Interventions   Education Provided Provided Education  Provided Verbal Education On Medication, When to see the doctor  [Taking medications as instructed]       Follow up plan: Follow up call scheduled for 3/3    Encounter Outcome:  Patient Visit Completed   Rodney Langton, RN, MSN, CCM [place]  [facility], [facility] RN Care Coordinator Direct Dial: [phone] / Main [phone] Fax [phone] Email: [email or web address]: [email or web address]

## 2023-06-27 ENCOUNTER — Ambulatory Visit
Admission: RE | Admit: 2023-06-27 | Discharge: 2023-06-27 | Disposition: A | Discharge status: Home / Self Care | Payer: Medicare PPO | Source: Ambulatory Visit | Attending: Gastroenterology | Admitting: Gastroenterology

## 2023-06-27 ENCOUNTER — Ambulatory Visit (INDEPENDENT_AMBULATORY_CARE_PROVIDER_SITE_OTHER): Payer: Medicare PPO

## 2023-06-27 DIAGNOSIS — K8020 Calculus of gallbladder without cholecystitis without obstruction: Secondary | ICD-10-CM | POA: Diagnosis not present

## 2023-06-27 DIAGNOSIS — Z23 Encounter for immunization: Secondary | ICD-10-CM

## 2023-06-27 DIAGNOSIS — K7031 Alcoholic cirrhosis of liver with ascites: Secondary | ICD-10-CM | POA: Insufficient documentation

## 2023-06-27 DIAGNOSIS — K7460 Unspecified cirrhosis of liver: Secondary | ICD-10-CM | POA: Diagnosis not present

## 2023-07-02 ENCOUNTER — Encounter: Payer: Self-pay | Admitting: Gastroenterology

## 2023-07-05 ENCOUNTER — Ambulatory Visit: Payer: Medicare PPO | Admitting: Gastroenterology

## 2023-07-05 ENCOUNTER — Encounter: Payer: Self-pay | Admitting: Gastroenterology

## 2023-07-05 VITALS — BP 128/87 | HR 75 | Temp 98.1°F | Wt 269.0 lb

## 2023-07-05 DIAGNOSIS — F1091 Alcohol use, unspecified, in remission: Secondary | ICD-10-CM | POA: Diagnosis not present

## 2023-07-05 DIAGNOSIS — K7030 Alcoholic cirrhosis of liver without ascites: Secondary | ICD-10-CM

## 2023-07-05 DIAGNOSIS — K7031 Alcoholic cirrhosis of liver with ascites: Secondary | ICD-10-CM | POA: Diagnosis not present

## 2023-07-05 NOTE — Progress Notes (Signed)
Primary Care Physician: Smitty Cords, DO  Primary Gastroenterologist:  Dr. Midge Minium  Chief Complaint  Patient presents with   Follow-up    HPI: Craig [name] is a 82 y.o. male here for follow-up of his cirrhosis.  The patient has had his alpha-fetoprotein and his right upper quadrant ultrasound done that has not shown any sign of any masses or worrisome features.  He was last seen approximately 1 year ago and is now here for follow-up. The patient has completely stopped drinking and his ascites has resolved without any medication.  The patient is supposed to have his alpha-fetoprotein checked today and his most recent ultrasound did not show any worrisome features.  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(HCC)     Current Outpatient Medications  Medication Sig Dispense Refill   atorvastatin (LIPITOR) 40 MG tablet TAKE 1 TABLET EVERY DAY 90 tablet 0   cholecalciferol (VITAMIN D) 1000 UNITS tablet Take 1,000 Units by mouth daily.     escitalopram (LEXAPRO) 20 MG tablet Take 1 tablet (20 mg total) by mouth daily. 90 tablet 3   metoprolol succinate (TOPROL-XL) 25 MG 24 hr tablet TAKE 1 TABLET EVERY DAY 90 tablet 1   omeprazole (PRILOSEC) 40 MG capsule Take 1 capsule (40 mg total) by mouth daily. 90 capsule 3   XARELTO 20 MG TABS tablet TAKE 1 TABLET (20 MG TOTAL) BY MOUTH DAILY WITH SUPPER. 90 tablet 3   No current facility-administered medications for this visit.    Allergies as of 07/05/2023 - Review Complete 07/05/2023  Allergen Reaction Noted   Trazodone and nefazodone Itching and Swelling 05/08/2017    ROS:  General: Negative for anorexia, weight loss, fever, chills, fatigue, weakness. ENT: Negative for hoarseness, difficulty  swallowing , nasal congestion. CV: Negative for chest pain, angina, palpitations, dyspnea on exertion, peripheral edema.  Respiratory: Negative for dyspnea at rest, dyspnea on exertion, cough, sputum, wheezing.  GI: See history of present illness. GU:  Negative for dysuria, hematuria, urinary incontinence, urinary frequency, nocturnal urination.  Endo: Negative for unusual weight change.    Physical Examination:   BP 128/87 (BP Location: Left Arm, Patient Position: Sitting, Cuff Size: Large)   Pulse 75   Temp 98.1 F (36.7 C) (Oral)   Wt 269 lb (122 kg)   BMI 33.62 kg/m   General: Well-nourished, well-developed in no acute distress.  Eyes: No icterus. Conjunctivae pink. Neuro: Alert and oriented x 3.  Grossly intact. Skin: Warm and dry, no jaundice.   Psych: Alert and cooperative, normal mood and affect.  Labs:    Imaging Studies: US ABDOMEN LIMITED RUQ (LIVER/GB) Result Date: 06/27/2023 CLINICAL DATA:  Cirrhosis EXAM: ULTRASOUND ABDOMEN LIMITED RIGHT UPPER QUADRANT COMPARISON:  Ultrasound abdomen 12/25/2022 FINDINGS: Gallbladder: Cholelithiasis. No gallbladder wall thickening or pericholecystic fluid. Negative sonographic Murphy's sign. Common bile duct: Diameter: 3.2 mm Liver: Mildly increased echogenicity. No focal lesion. Portal vein is patent on color Doppler imaging with normal direction of blood flow towards the liver. Other: None. IMPRESSION: 1. Cholelithiasis without secondary signs of acute cholecystitis. 2. Mildly increased hepatic parenchymal echogenicity suggestive of steatosis. Electronically Signed   By: Annia Belt M.D.   On: 06/27/2023 11:54  Assessment and Plan:   Craig [name] is a 82 y.o. y/o male who comes in today with a history of alcoholic cirrhosis with cessation of alcohol use with recovery of some of his synthetic function with resolution of his ascites.  The patient has been doing well without any complaints.  The patient is continuing to be monitored for  [facility] and had an ultrasound that did not show any liver lesions and he is due to have a alpha-fetoprotein checked today.  The patient will have these repeated in 6 months.  The patient has been explained the plan agrees with it.     Midge Minium, MD. Clement[facility] Graham    Note: This dictation was prepared with Dragon dictation along with smaller phrase technology. Any transcriptional errors that result from this process are unintentional.

## 2023-07-06 LAB — AFP TUMOR MARKER: AFP, Serum, Tumor Marker: <1.8 ng/mL (ref 0.0–6.4)

## 2023-07-09 ENCOUNTER — Other Ambulatory Visit: Payer: Self-pay | Admitting: Family Medicine

## 2023-07-09 ENCOUNTER — Other Ambulatory Visit: Payer: Self-pay | Admitting: Internal Medicine

## 2023-07-09 DIAGNOSIS — F5101 Primary insomnia: Secondary | ICD-10-CM

## 2023-07-09 DIAGNOSIS — F331 Major depressive disorder, recurrent, moderate: Secondary | ICD-10-CM

## 2023-07-09 DIAGNOSIS — E785 Hyperlipidemia, unspecified: Secondary | ICD-10-CM

## 2023-07-10 NOTE — Telephone Encounter (Signed)
Requested by interface surescripts. Future visit in 1 month. Requested Prescriptions  Pending Prescriptions Disp Refills   escitalopram (LEXAPRO) 20 MG tablet [Pharmacy Med Name: Escitalopram Oxalate Oral Tablet 20 MG] 90 tablet 0    Sig: TAKE 1 TABLET EVERY DAY     Psychiatry:  Antidepressants - SSRI Passed - 07/10/2023  8:58 AM      Passed - Completed PHQ-2 or PHQ-9 in the last 360 days      Passed - Valid encounter within last 6 months    Recent Outpatient Visits           5 months ago Alcoholic cirrhosis of liver without ascites [facility])   [place] [facility] Smitty Cords, DO   11 months ago Annual physical exam   [place] [facility] Smitty Cords, DO   1 year ago Alcoholic cirrhosis of liver with ascites [facility])   [place] [facility] Smit[facility] C[facility], DO   1 year ago Alcoholic cirrhosis of liver with ascites [facility])   [place] [facility] Smitty Cords, DO   1 year ago Lymphedema of both lower extremities   [place] [facility] [place], Netta Neat, DO       Future Appointments             In 1 month Althea Charon, Netta Neat, DO  [facility], [facility]

## 2023-07-10 NOTE — Telephone Encounter (Signed)
Requested medications are due for refill today.  yes  Requested medications are on the active medications list.  yes  Last refill. 04/27/2023 #90 0 rf  Future visit scheduled.   yes  Notes to clinic.  Labs are expired    Requested Prescriptions  Pending Prescriptions Disp Refills   atorvastatin (LIPITOR) 40 MG tablet [Pharmacy Med Name: Atorvastatin Calcium Oral Tablet 40 MG] 90 tablet 3    Sig: TAKE 1 TABLET EVERY DAY     Cardiovascular:  Antilipid - Statins Failed - 07/10/2023  8:47 AM      Failed - Lipid Panel in normal range within the last 12 months    Cholesterol, Total  Date Value Ref Range Status  06/24/2019 181 100 - 199 mg/dL Final   Cholesterol  Date Value Ref Range Status  08/10/2021 126 <200 mg/dL Final   LDL Cholesterol (Calc)  Date Value Ref Range Status  08/10/2021 35 mg/dL (calc) Final    Comment:    Reference range: <100 . Desirable range <100 mg/dL for primary prevention;   <70 mg/dL for patients with CHD or diabetic patients  with > or = 2 CHD risk factors. Marland Kitchen LDL-C is now calculated using the Martin-Hopkins  calculation, which is a validated novel method providing  better accuracy than the Friedewald equation in the  estimation of LDL-C.  Horald Pollen et al. Lenox Ahr. 7253;664(40): 2061-2068  (http://education.[employer].com/faq/FAQ164)    HDL  Date Value Ref Range Status  08/10/2021 68 > OR = 40 mg/dL Final  34/74/2595 638 >75 mg/dL Final   Triglycerides  Date Value Ref Range Status  08/10/2021 146 <150 mg/dL Final         Passed - Patient is not pregnant      Passed - Valid encounter within last 12 months    Recent Outpatient Visits           5 months ago Alcoholic cirrhosis of liver without ascites [facility])   [place] [facility] Smitty Cords, DO   11 months ago Annual physical exam   [place] [facility] Smitty Cords, DO   1 year ago Alcoholic cirrhosis of liver with  ascites [facility])   [place] [facility] Smitty Cords, DO   1 year ago Alcoholic cirrhosis of liver with ascites [facility])   [place] [facility] Smit[facility] C[facility], DO   1 year ago Lymphedema of both lower extremities   [place] [facility] [place], Netta Neat, DO       Future Appointments             In 1 month Althea Charon, Netta Neat, DO [place] [facility], [facility]

## 2023-08-03 ENCOUNTER — Other Ambulatory Visit: Payer: Medicare PPO

## 2023-08-06 ENCOUNTER — Other Ambulatory Visit: Payer: Self-pay

## 2023-08-06 DIAGNOSIS — K7030 Alcoholic cirrhosis of liver without ascites: Secondary | ICD-10-CM

## 2023-08-06 DIAGNOSIS — E782 Mixed hyperlipidemia: Secondary | ICD-10-CM

## 2023-08-06 DIAGNOSIS — R7309 Other abnormal glucose: Secondary | ICD-10-CM

## 2023-08-06 DIAGNOSIS — Z0000 Encounter for general adult medical examination without abnormal findings: Secondary | ICD-10-CM

## 2023-08-06 DIAGNOSIS — I4819 Other persistent atrial fibrillation: Secondary | ICD-10-CM

## 2023-08-06 DIAGNOSIS — I10 Essential (primary) hypertension: Secondary | ICD-10-CM

## 2023-08-07 ENCOUNTER — Other Ambulatory Visit: Payer: Medicare PPO

## 2023-08-07 DIAGNOSIS — R7309 Other abnormal glucose: Secondary | ICD-10-CM | POA: Diagnosis not present

## 2023-08-07 DIAGNOSIS — K7030 Alcoholic cirrhosis of liver without ascites: Secondary | ICD-10-CM | POA: Diagnosis not present

## 2023-08-07 DIAGNOSIS — Z0000 Encounter for general adult medical examination without abnormal findings: Secondary | ICD-10-CM | POA: Diagnosis not present

## 2023-08-07 DIAGNOSIS — I4819 Other persistent atrial fibrillation: Secondary | ICD-10-CM | POA: Diagnosis not present

## 2023-08-07 DIAGNOSIS — I10 Essential (primary) hypertension: Secondary | ICD-10-CM | POA: Diagnosis not present

## 2023-08-07 DIAGNOSIS — E782 Mixed hyperlipidemia: Secondary | ICD-10-CM | POA: Diagnosis not present

## 2023-08-07 DIAGNOSIS — R351 Nocturia: Secondary | ICD-10-CM | POA: Diagnosis not present

## 2023-08-07 DIAGNOSIS — N401 Enlarged prostate with lower urinary tract symptoms: Secondary | ICD-10-CM | POA: Diagnosis not present

## 2023-08-08 LAB — COMPLETE METABOLIC PANEL WITHOUT GFR
AG Ratio: 1.3 (calc) (ref 1.0–2.5)
ALT: 5 U/L — ABNORMAL LOW (ref 9–46)
AST: 13 U/L (ref 10–35)
Albumin: 3.9 g/dL (ref 3.6–5.1)
Alkaline phosphatase (APISO): 90 U/L (ref 35–144)
BUN: 15 mg/dL (ref 7–25)
CO2: 34 mmol/L — ABNORMAL HIGH (ref 20–32)
Calcium: 9.9 mg/dL (ref 8.6–10.3)
Chloride: 100 mmol/L (ref 98–110)
Creat: 0.89 mg/dL (ref 0.70–1.22)
Globulin: 3 g/dL (ref 1.9–3.7)
Glucose, Bld: 116 mg/dL — ABNORMAL HIGH (ref 65–99)
Potassium: 4.2 mmol/L (ref 3.5–5.3)
Sodium: 141 mmol/L (ref 135–146)
Total Bilirubin: 1 mg/dL (ref 0.2–1.2)
Total Protein: 6.9 g/dL (ref 6.1–8.1)
eGFR: 86 mL/min/1.73m2

## 2023-08-08 LAB — CBC WITH DIFFERENTIAL/PLATELET
Absolute Lymphocytes: 1755 {cells}/uL (ref 850–3900)
Absolute Monocytes: 534 {cells}/uL (ref 200–950)
Basophils Absolute: 28 {cells}/uL (ref 0–200)
Basophils Relative: 0.5 %
Eosinophils Absolute: 61 {cells}/uL (ref 15–500)
Eosinophils Relative: 1.1 %
HCT: 44.7 % (ref 38.5–50.0)
Hemoglobin: 14.5 g/dL (ref 13.2–17.1)
MCH: 32 pg (ref 27.0–33.0)
MCHC: 32.4 g/dL (ref 32.0–36.0)
MCV: 98.7 fL (ref 80.0–100.0)
MPV: 11.3 fL (ref 7.5–12.5)
Monocytes Relative: 9.7 %
Neutro Abs: 3124 {cells}/uL (ref 1500–7800)
Neutrophils Relative %: 56.8 %
Platelets: 279 Thousand/uL (ref 140–400)
RBC: 4.53 Million/uL (ref 4.20–5.80)
RDW: 14.2 % (ref 11.0–15.0)
Total Lymphocyte: 31.9 %
WBC: 5.5 Thousand/uL (ref 3.8–10.8)

## 2023-08-08 LAB — LIPID PANEL
Cholesterol: 131 mg/dL
HDL: 60 mg/dL
LDL Cholesterol (Calc): 54 mg/dL
Non-HDL Cholesterol (Calc): 71 mg/dL
Total CHOL/HDL Ratio: 2.2 (calc)
Triglycerides: 88 mg/dL

## 2023-08-08 LAB — TSH: TSH: 1.61 m[IU]/L (ref 0.40–4.50)

## 2023-08-08 LAB — HEMOGLOBIN A1C
Hgb A1c MFr Bld: 6.4 %{Hb} — ABNORMAL HIGH
Mean Plasma Glucose: 137 mg/dL
eAG (mmol/L): 7.6 mmol/L

## 2023-08-08 LAB — PSA: PSA: 0.88 ng/mL

## 2023-08-10 ENCOUNTER — Ambulatory Visit (INDEPENDENT_AMBULATORY_CARE_PROVIDER_SITE_OTHER): Payer: Medicare PPO | Admitting: Family Medicine

## 2023-08-10 ENCOUNTER — Encounter: Payer: Self-pay | Admitting: Family Medicine

## 2023-08-10 VITALS — BP 122/64 | HR 70 | Ht 75.0 in | Wt 270.0 lb

## 2023-08-10 DIAGNOSIS — Z0000 Encounter for general adult medical examination without abnormal findings: Secondary | ICD-10-CM | POA: Diagnosis not present

## 2023-08-10 DIAGNOSIS — K7030 Alcoholic cirrhosis of liver without ascites: Secondary | ICD-10-CM | POA: Diagnosis not present

## 2023-08-10 DIAGNOSIS — E782 Mixed hyperlipidemia: Secondary | ICD-10-CM

## 2023-08-10 DIAGNOSIS — I10 Essential (primary) hypertension: Secondary | ICD-10-CM

## 2023-08-10 DIAGNOSIS — I890 Lymphedema, not elsewhere classified: Secondary | ICD-10-CM

## 2023-08-10 DIAGNOSIS — J432 Centrilobular emphysema: Secondary | ICD-10-CM

## 2023-08-10 DIAGNOSIS — I4819 Other persistent atrial fibrillation: Secondary | ICD-10-CM | POA: Diagnosis not present

## 2023-08-10 NOTE — Progress Notes (Signed)
Subjective:    Patient ID: Craig [name], male    DOB: [birth year], 83 y.o.   MRN: [id number]  Craig [name] is a 83 y.o. male presenting on 08/10/2023 for Annual Exam   HPI  Discussed the use of AI scribe software for clinical note transcription with the patient, who gave verbal consent to proceed.  History of Present Illness         Here for Annual Physical  Elevated A1c 6.4 Prior range 5s, now higher due to intake of hard candy and diet not adhering.  Dry mouth Using lozenge or hard candy  Emphysema / Chronic Cough 5+ years  Tried Flonase allergy meds, GERD therapy in past. Also has been dx with COPD but never maintained on regular inhaler in past since had not bothered him as much. Now feels like it affects his breathing interested in therapy.   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   HYPERLIPIDEMIA: - Reports no concerns. 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 He sleeps during the day with naps. Long term problem.   Recurrent PE/DVT, on anticoagulation lifelong Reports no new problem. Continues to take Xarelto daily for anticoagulation     Health Maintenance: Vaccines updated  PSA 0.88     08/10/2023    3:33 PM 08/14/2022   11:01 AM 12/23/2021    3:32 PM  Depression screen PHQ 2/9   Decreased Interest 3 2 1   Down, Depressed, Hopeless 1 3 0  PHQ - 2 Score 4 5 1   Altered sleeping 3 3 3   Tired, decreased energy 3 3 3   Change in appetite 2 3 1   Feeling bad or failure about yourself  0 3 1  Trouble concentrating 3 3 3   Moving slowly or fidgety/restless 1 1 1   Suicidal thoughts 0 1 0  PHQ-9 Score 16 22 13   Difficult doing work/chores  Somewhat difficult Somewhat difficult       08/10/2023    3:34 PM 08/14/2022   11:03 AM 12/23/2021    3:32 PM 08/10/2021   10:39 AM  GAD 7 : Generalized Anxiety Score  Nervous, Anxious, on Edge 2 3 2 2   Control/stop worrying 2 1 1 1   Worry too much - different things 1 1 1 1   Trouble relaxing 1 3 1 1   Restless 1 1 1 1   Easily annoyed or irritable 2 1 1 1   Afraid - awful might happen 0 1 1 1   Total GAD 7 Score 9 11 8 8   Anxiety Difficulty Somewhat difficult Somewhat difficult Somewhat difficult Somewhat difficult     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[facility])    Past Surgical History:  Procedure Laterality Date   ESOPHAGOGASTRODUODENOSCOPY (EGD) WITH PROPOFOL N/A  01/31/2022   Procedure: ESOPHAGOGASTRODUODENOSCOPY (EGD) WITH PROPOFOL;  Surgeon: Midge Minium, MD;  Location[facility] ENDOSCOPY;  Service: Endoscopy;  Laterality: N/A;   LEFT HEART CATHETERIZATION WITH CORONARY ANGIOGRAM Bilateral 07/27/2014   Procedure: LEFT HEART CATHETERIZATION WITH CORONARY ANGIOGRAM;  Surgeon: Runell Gess, MD;  Location: [facility] CATH LAB;  Service: Cardiovascular;  Laterality: Bilateral;   SPLENECTOMY, TOTAL     TTP   Social History   Socioeconomic History   Marital status: Single    Spouse name: Not on file   Number of children: Not on file   Years of education: Not on file   Highest education level: Associate degree: occupational, Scientist, product/process development, or vocational program  Occupational History   Occupation: Retired Water quality scientist)   Tobacco Use   Smoking status: Former    Current packs/day: 0.00    Types: Cigarettes    Start date: 07/24/1958    Quit date: 07/24/1978    Years since quitting: 45.0   Smokeless tobacco: Former   Tobacco comments:    Quit tobacco in 1980  Vaping Use   Vaping status: Never Used  Substance and Sexual Activity   Alcohol use: Not Currently    Alcohol/week: 3.0 standard drinks of alcohol    Types: 3 Shots of liquor per week    Comment: daily   Drug use: No   Sexual activity: Yes    Birth control/protection: None  Other Topics Concern   Not on file  Social History Narrative   Retired from [employer]. Retired x 20 years.  Two children.     Social Drivers of Corporate investment banker Strain: Low Risk  (02/07/2023)   Overall Financial Resource Strain (CARDIA)    Difficulty of Paying Living Expenses: Not hard at all  Food Insecurity: No Food Insecurity (02/07/2023)   Hunger Vital Sign    Worried About Running Out of Food in the Last Year: Never true    Ran Out of Food in the Last Year: Never true  Transportation Needs: No Transportation Needs (02/07/2023)   PRAPARE - Administrator, Civil Service (Medical): No    Lack of Transportation (Non-Medical): No  Physical Activity: Unknown (02/07/2023)   Exercise Vital Sign    Days of Exercise per Week: 0 days    Minutes of Exercise per Session: Not on file  Recent Concern: Physical Activity - Inactive (02/07/2023)   Exercise Vital Sign    Days of Exercise per Week: 0 days    Minutes of Exercise per Session: 0 min  Stress: Stress Concern Present (02/07/2023)   [employer] of Occupational Health - Occupational Stress Questionnaire    Feeling of Stress : Very much  Social Connections: Socially Isolated (02/07/2023)   Social Connection and Isolation Panel [NHANES]    Frequency of Communication with Friends and Family: Once a week    Frequency of Social Gatherings with Friends and Family: Never    Attends Religious  Services: Never    Database administrator or Organizations: No    Attends Engineer, structural: Not on file    Marital Status: Living with partner  Intimate Partner Violence: Not At Risk (03/22/2022)   Humiliation, Afraid, Rape, and Kick questionnaire    Fear of Current or Ex-Partner: No    Emotionally Abused: No    Physically Abused: No    Sexually Abused: No   Family History  Problem Relation Age of Onset   CAD Father 34  Kidney failure Mother    CAD Brother    Current Outpatient Medications on File Prior to Visit  Medication Sig   atorvastatin (LIPITOR) 40 MG tablet TAKE 1 TABLET EVERY DAY   cholecalciferol (VITAMIN D) 1000 UNITS tablet Take 1,000 Units by mouth daily.   escitalopram (LEXAPRO) 20 MG tablet TAKE 1 TABLET EVERY DAY   metoprolol succinate (TOPROL-XL) 25 MG 24 hr tablet TAKE 1 TABLET EVERY DAY   omeprazole (PRILOSEC) 40 MG capsule Take 1 capsule (40 mg total) by mouth daily.   XARELTO 20 MG TABS tablet TAKE 1 TABLET (20 MG TOTAL) BY MOUTH DAILY WITH SUPPER.   No current facility-administered medications on file prior to visit.    Review of Systems  Constitutional:  Negative for activity change, appetite change, chills, diaphoresis, fatigue and fever.  HENT:  Negative for congestion and hearing loss.   Eyes:  Negative for visual disturbance.  Respiratory:  Positive for cough. Negative for chest tightness, shortness of breath and wheezing.   Cardiovascular:  Negative for chest pain, palpitations and leg swelling.  Gastrointestinal:  Negative for abdominal pain, constipation, diarrhea, nausea and vomiting.  Genitourinary:  Negative for dysuria, frequency and hematuria.  Musculoskeletal:  Negative for arthralgias and neck pain.  Skin:  Negative for rash.  Neurological:  Negative for dizziness, weakness, light-headedness, numbness and headaches.  Hematological:  Negative for adenopathy.  Psychiatric/Behavioral:  Negative for behavioral problems, dysphoric  mood and sleep disturbance.    Per HPI unless specifically indicated above     Objective:    BP 122/64   Pulse 70   Ht 6\' 3"  (1.905 m)   Wt 270 lb (122.5 kg)   SpO2 91%   BMI 33.75 kg/m   Wt Readings from Last 3 Encounters:  08/10/23 270 lb (122.5 kg)  07/05/23 269 lb (122 kg)  02/07/23 264 lb (119.7 kg)    Physical Exam Vitals and nursing note reviewed.  Constitutional:      General: He is not in acute distress.    Appearance: He is well-developed. He is not diaphoretic.     Comments: Well-appearing, comfortable, cooperative  HENT:     Head: Normocephalic and atraumatic.  Eyes:     General:        Right eye: No discharge.        Left eye: No discharge.     Conjunctiva/sclera: Conjunctivae normal.     Pupils: Pupils are equal, round, and reactive to light.  Neck:     Thyroid: No thyromegaly.  Cardiovascular:     Rate and Rhythm: Normal rate and regular rhythm.     Pulses: Normal pulses.     Heart sounds: Normal heart sounds. No murmur heard. Pulmonary:     Effort: Pulmonary effort is normal. No respiratory distress.     Breath sounds: Normal breath sounds. No wheezing or rales.  Abdominal:     General: Bowel sounds are normal. There is no distension.     Palpations: Abdomen is soft. There is no mass.     Tenderness: There is no abdominal tenderness.  Musculoskeletal:        General: No tenderness. Normal range of motion.     Cervical back: Normal range of motion and neck supple.     Comments: Upper / Lower Extremities: - Normal muscle tone, strength bilateral upper extremities 5/5, lower extremities 5/5  Lymphadenopathy:     Cervical: No cervical adenopathy.  Skin:    General: Skin is warm and dry.  Findings: Lesion (R face with large 2 x 2 cm soft SK) present. No erythema or rash.  Neurological:     Mental Status: He is alert and oriented to person, place, and time.     Comments: Distal sensation intact to light touch all extremities  Psychiatric:         Mood and Affect: Mood normal.        Behavior: Behavior normal.        Thought Content: Thought content normal.     Comments: Well groomed, good eye contact, normal speech and thoughts     Results for orders placed or performed in visit on 08/06/23  COMPLETE METABOLIC PANEL WITH GFR   Collection Time: 08/07/23 10:37 AM  Result Value Ref Range   Glucose, Bld 116 (H) 65 - 99 mg/dL   BUN 15 7 - 25 mg/dL   Creat 1.61 0.96 - 0.45 mg/dL   eGFR 86 > OR = 60 WU/JWJ/1.91Y7   BUN/Creatinine Ratio SEE NOTE: 6 - 22 (calc)   Sodium 141 135 - 146 mmol/L   Potassium 4.2 3.5 - 5.3 mmol/L   Chloride 100 98 - 110 mmol/L   CO2 34 (H) 20 - 32 mmol/L   Calcium 9.9 8.6 - 10.3 mg/dL   Total Protein 6.9 6.1 - 8.1 g/dL   Albumin 3.9 3.6 - 5.1 g/dL   Globulin 3.0 1.9 - 3.7 g/dL (calc)   AG Ratio 1.3 1.0 - 2.5 (calc)   Total Bilirubin 1.0 0.2 - 1.2 mg/dL   Alkaline phosphatase (APISO) 90 35 - 144 U/L   AST 13 10 - 35 U/L   ALT 5 (L) 9 - 46 U/L  Hemoglobin A1c   Collection Time: 08/07/23 10:37 AM  Result Value Ref Range   Hgb A1c MFr Bld 6.4 (H) <5.7 % of total Hgb   Mean Plasma Glucose 137 mg/dL   eAG (mmol/L) 7.6 mmol/L  CBC with Differential/Platelet   Collection Time: 08/07/23 10:37 AM  Result Value Ref Range   WBC 5.5 3.8 - 10.8 Thousand/uL   RBC 4.53 4.20 - 5.80 Million/uL   Hemoglobin 14.5 13.2 - 17.1 g/dL   HCT 82.9 56.2 - 13.0 %   MCV 98.7 80.0 - 100.0 fL   MCH 32.0 27.0 - 33.0 pg   MCHC 32.4 32.0 - 36.0 g/dL   RDW 86.5 78.4 - 69.6 %   Platelets 279 140 - 400 Thousand/uL   MPV 11.3 7.5 - 12.5 fL   Neutro Abs 3,124 1,500 - 7,800 cells/uL   Absolute Lymphocytes 1,755 850 - 3,900 cells/uL   Absolute Monocytes 534 200 - 950 cells/uL   Eosinophils Absolute 61 15 - 500 cells/uL   Basophils Absolute 28 0 - 200 cells/uL   Neutrophils Relative % 56.8 %   Total Lymphocyte 31.9 %   Monocytes Relative 9.7 %   Eosinophils Relative 1.1 %   Basophils Relative 0.5 %  PSA   Collection Time:  08/07/23 10:37 AM  Result Value Ref Range   PSA 0.88 < OR = 4.00 ng/mL  Lipid panel   Collection Time: 08/07/23 10:37 AM  Result Value Ref Range   Cholesterol 131 <200 mg/dL   HDL 60 > OR = 40 mg/dL   Triglycerides 88 <295 mg/dL   LDL Cholesterol (Calc) 54 mg/dL (calc)   Total CHOL/HDL Ratio 2.2 <5.0 (calc)   Non-HDL Cholesterol (Calc) 71 <284 mg/dL (calc)  TSH   Collection Time: 08/07/23 10:37 AM  Result Value Ref Range  TSH 1.61 0.40 - 4.50 mIU/L      Assessment & Plan:   Problem List Items Addressed This Visit     Essential hypertension (Chronic)   Hyperlipidemia   Lymphedema of both lower extremities   Persistent atrial fibrillation (HCC)   Other Visit Diagnoses       Annual physical exam    -  Primary     Alcoholic cirrhosis of liver without ascites (HCC)         Centrilobular emphysema (HCC)            Updated Health Maintenance information Reviewed recent lab results with patient Encouraged improvement to lifestyle with diet and exercise Goal of weight loss  Alcoholic Cirrhosis Followed by Dr Servando Snare [facility]  Monitoring on lab and [place] surveillance  Prediabetes A1c of 6.4, close to the diagnostic threshold for type 2 diabetes. Discussed the importance of lifestyle modifications and hydration to manage blood glucose levels. -Encouraged to continue lifestyle modifications to prevent progression to type 2 diabetes. -Recheck A1c in 6 months.  Centrilobular Emphysema COPD Chronic Cough Longstanding cough with a recent diagnosis of COPD. No significant relief with previous treatments including reflux medication and sinus treatments. -Start Breztri inhaler, 2 puffs twice a day for 1 week. Notify if beneficial we can order / or find other option that is covered / preferred -Reevaluate in 6 months or sooner if symptoms worsen.  Hyperlipidemia Well controlled with Atorvastatin. -Continue Atorvastatin. -Recheck lipid panel in 1 year.  Seborrheic Keratosis R  Face Benign skin lesion causing discomfort due to scratching. -Consider dermatology referral for possible removal if continues to cause discomfort.  General Health Maintenance Up to date on vaccinations, recent labs within normal limits including TSH, cholesterol panel, PSA, CBC, liver and kidney function. -Continue current management. -Annual check-up in 1 year.         No orders of the defined types were placed in this encounter.   No orders of the defined types were placed in this encounter.    Follow up plan: Return in about 6 months (around 02/07/2024) for 6 month PreDM A1c, COPD breathing.  Saralyn Pilar, DO [facility] 08/10/2023, 3:49 PM

## 2023-08-10 NOTE — Patient Instructions (Addendum)
Thank you for coming to the office today.  Try Breztri 2 puffs twice a day. For 1 week sample  Message or call if it works and I can order it for you!  May need to switch the version at the pharmacy.  Recent Labs    08/07/23 1037  HGBA1C 6.4*   Goal to improve diet  ---------------  Diet Recommendations for Preventing Diabetes   REDUCE Starchy (carb) foods include: Bread, rice, pasta, potatoes, corn, crackers, bagels, muffins, all baked goods.   FRUITS - LIMIT these HIGH sugar/carb fruits = Pineapple, Watermelon, Bananas - OKAY with these MEDIUM sugar/carb fruits = Citrus, Oranges, Grapes - PREFER these LOW sugar/carb fruits = Apples, Berries, Pears, Plums  Protein foods include: Meat, fish, poultry, eggs, dairy foods, and beans such as pinto and kidney beans (beans also provide carbohydrate).   1. Eat at least 3 meals and 1-2 snacks per day. Never go more than 4-5 hours while awake without eating.   2. Limit starchy foods to TWO per meal and ONE per snack. ONE portion of a starchy  food is equal to the following:   - ONE slice of bread (or its equivalent, such as half of a hamburger bun).   - 1/2 cup of a "scoopable" starchy food such as potatoes or rice.   - 1 OUNCE (28 grams) of starchy snacks (crackers or pretzels, look on label).   - 15 grams of carbohydrate as shown on food label.   3. Both lunch and dinner should include a protein food, a carb food, and vegetables.   - Obtain twice as many veg's as protein or carbohydrate foods for both lunch and dinner.   - Try to keep frozen veg's on hand for a quick vegetable serving.     - Fresh or frozen veg's are best.   4. Breakfast should always include protein.     Seborrheic keratosis (seb-o-REE-ik care-uh-TOE-sis) is a common skin growth. It may seem worrisome because it can look like a wart, pre-cancerous skin growth (actinic keratosis), or skin cancer. Despite their appearance, seborrheic keratoses are harmless. Most  people get these growths when they are middle aged or older. Because they begin at a later age and can have a wart-like appearance, seborrheic keratoses are often called the "barnacles of aging." It's possible to have just one of these growths, but most people develop several. Some growths may have a warty surface while others look like dabs of warm, brown candle wax on the skin. Seborrheic keratoses range in color from white to black; however, most are tan or brown. You can find these harmless growths anywhere on the skin, except the palms and soles. Most often, you'll see them on the chest, back, head, or neck.   Please schedule a Follow-up Appointment to: No follow-ups on file.  If you have any other questions or concerns, please feel free to call the office or send a message through MyChart. You may also schedule an earlier appointment if necessary.  Additionally, you may be receiving a survey about your experience at our office within a few days to 1 week by e-mail or mail. We value your feedback.  Saralyn Pilar, DO [facility], [place]

## 2023-08-29 ENCOUNTER — Encounter: Payer: Medicare PPO | Admitting: Internal Medicine

## 2023-09-09 ENCOUNTER — Other Ambulatory Visit: Payer: Self-pay | Admitting: Family Medicine

## 2023-09-09 DIAGNOSIS — I10 Essential (primary) hypertension: Secondary | ICD-10-CM

## 2023-09-10 NOTE — Telephone Encounter (Signed)
 Requested medications are due for refill today.  yes  Requested medications are on the active medications list.  yes  Last refill. 04/16/2023 #90 1 rf  Future visit scheduled.   yes  Notes to clinic.  Dana Allan listed as PCP.    Requested Prescriptions  Pending Prescriptions Disp Refills   metoprolol succinate (TOPROL-XL) 25 MG 24 hr tablet [Pharmacy Med Name: Metoprolol Succinate ER Oral Tablet Extended Release 24 Hour 25 MG] 90 tablet 3    Sig: TAKE 1 TABLET EVERY DAY     Cardiovascular:  Beta Blockers Passed - 09/10/2023  4:53 PM      Passed - Last BP in normal range    BP Readings from Last 1 Encounters:  08/10/23 122/64         Passed - Last Heart Rate in normal range    Pulse Readings from Last 1 Encounters:  08/10/23 70         Passed - Valid encounter within last 6 months    Recent Outpatient Visits           1 month ago Annual physical exam   [facility] [place], Netta Neat, DO   7 months ago Alcoholic cirrhosis of liver without ascites [facility])   [place] [facility] Smitty Cords, DO   1 year ago Annual physical exam   [facility] Smitty Cords, DO   1 year ago Alcoholic cirrhosis of liver with ascites [facility])   [place] [facility] Smitty Cords, DO   1 year ago Alcoholic cirrhosis of liver with ascites [facility])   [place] [facility] Althea Charon, Netta Neat, DO       Future Appointments             In 5 months Althea Charon, Netta Neat, DO [facility], [facility]

## 2023-09-22 ENCOUNTER — Other Ambulatory Visit: Payer: Self-pay | Admitting: Family Medicine

## 2023-09-22 DIAGNOSIS — F331 Major depressive disorder, recurrent, moderate: Secondary | ICD-10-CM

## 2023-09-22 DIAGNOSIS — F5101 Primary insomnia: Secondary | ICD-10-CM

## 2023-09-24 ENCOUNTER — Ambulatory Visit: Payer: Self-pay | Admitting: *Deleted

## 2023-09-24 NOTE — Patient Outreach (Signed)
 Care Coordination   Follow Up Visit Note   09/24/2023 Name: Craig [name] MRN: [id number] DOB: 13-Sep-1940  Craig [name] is a 83 y.o. year old male who sees No primary care provider on file. for primary care. I spoke with wife of Craig [name] by phone today.  What matters to the patients health and wellness today?  Wife report patient is doing well, has not had any issues/complications with cirrhosis in a while.  Denies any urgent concerns, encouraged to contact this care manager with questions.      Goals Addressed             This Visit's Progress    COMPLETED: RNCM: Effective Management of Cirrhosis   On track    Care Coordination Interventions: Evaluation of current treatment plan related to cirrhosis of the  and patient's adherence to plan as established by provider.  Advised patient to monitor for weight gain, shortness of breath, changes in memory, and other sx and sx of worsening conditions associated with cirrhosis of the liver. Review of worsening sx and sx of condition and review of how to respond if the patient  has acute onset of weight gain and shortness of breath.  Reviewed medications with patient and discussed compliance. Reviewed scheduled/upcoming provider appointments Discussed plans with patient for ongoing care management follow up and provided patient with direct contact information for care management team             SDOH assessments and interventions completed:  No     Care Coordination Interventions:  Yes, provided   Interventions Today    Flowsheet Row Most Recent Value  Chronic Disease   Chronic disease during today's visit Other  [cirrhosis of the liver]  General Interventions   General Interventions Discussed/Reviewed General Interventions Reviewed, Doctor Visits  Doctor Visits Discussed/Reviewed PCP, Specialist, Doctor Visits Reviewed  [Upcoming with GI and PCP in June and July (6 month follow ups)]  PCP/Specialist Visits Compliance with  follow-up visit  Education Interventions   Education Provided Provided Education  Provided Verbal Education On When to see the doctor, Medication  [Meds reviewed, report sample was given for Breztri, but patient now always taking correctly or at all due to mental capacity.  She will discuss other options if possible with MD]        Follow up plan: No further intervention required.   Encounter Outcome:  Patient Visit Completed   Rodney Langton, RN, MSN, CCM [place]  [facility], [facility] RN Care Coordinator Direct Dial: [phone] / Main [phone] Fax [phone] Email: [email or web address]: [email or web address]

## 2023-09-24 NOTE — Telephone Encounter (Signed)
 Requested medications are due for refill today.  yes  Requested medications are on the active medications list.  yes  Last refill. 07/10/2023 #90 0 rf  Future visit scheduled.   yes  Notes to clinic.  No pcp listed.    Requested Prescriptions  Pending Prescriptions Disp Refills   escitalopram (LEXAPRO) 20 MG tablet [Pharmacy Med Name: Escitalopram Oxalate Oral Tablet 20 MG] 90 tablet 3    Sig: TAKE 1 TABLET EVERY DAY     Psychiatry:  Antidepressants - SSRI Passed - 09/24/2023  1:54 PM      Passed - Completed PHQ-2 or PHQ-9 in the last 360 days      Passed - Valid encounter within last 6 months    Recent Outpatient Visits           1 month ago Annual physical exam   [place] [facility], Netta Neat, DO   7 months ago Alcoholic cirrhosis of liver without ascites [facility])   [place] [facility] Smitty Cords, DO   1 year ago Annual physical exam   [facility] Smitty Cords, DO   1 year ago Alcoholic cirrhosis of liver with ascites [facility])   [place] [facility] Smitty Cords, DO   1 year ago Alcoholic cirrhosis of liver with ascites [facility])   [place] [facility] Althea Charon, Netta Neat, DO       Future Appointments             In 4 months Althea Charon, Netta Neat, DO  [facility], [facility]

## 2023-12-05 ENCOUNTER — Telehealth: Payer: Self-pay | Admitting: *Deleted

## 2023-12-05 NOTE — Patient Outreach (Signed)
 Received voicemail from wife stating she received letter that Dr Ole Berkeley would be leaving the GI practice and she needed further advice on finding new provider.  Spoke with Alvy Baar, wife today, advised that there are other providers in the [place] clinic as well as the [place] location.  Advised to keep the regular 6 month schedule, and ask to be scheduled with a different provider at that time.  She verbalized understanding and expresses gratitude for guidance.  Holland Lundborg, RN, MSN, CCM [facility], [facility] RN Care Coordinator Direct Dial: [phone] / Main [phone] Fax [phone] Email: [email or web address]: [email or web address]

## 2023-12-06 ENCOUNTER — Other Ambulatory Visit: Payer: Self-pay | Admitting: Family Medicine

## 2023-12-06 DIAGNOSIS — I2699 Other pulmonary embolism without acute cor pulmonale: Secondary | ICD-10-CM

## 2023-12-07 NOTE — Telephone Encounter (Signed)
 Rx 02/12/23 #90 3RF- too soon Requested Prescriptions  Pending Prescriptions Disp Refills   XARELTO  20 MG TABS tablet [Pharmacy Med Name: Xarelto  Oral Tablet 20 MG] 90 tablet 3    Sig: TAKE 1 TABLET (20 MG TOTAL) BY MOUTH DAILY WITH SUPPER.     Hematology: Anticoagulants - rivaroxaban  Failed - 12/07/2023  1:02 PM      Failed - ALT in normal range and within 360 days    ALT  Date Value Ref Range Status  08/07/2023 5 (L) 9 - 46 U/L Final   SGPT (ALT)  Date Value Ref Range Status  07/25/2014 16 U/L Final    Comment:    14-63 NOTE: New Reference Range 02/10/14          Failed - Valid encounter within last 12 months    Recent Outpatient Visits   None     Future Appointments             In 2 months Karamalegos, Kayleen Party, DO [place] [facility]            Passed - AST in normal range and within 360 days    AST  Date Value Ref Range Status  08/07/2023 13 10 - 35 U/L Final   SGOT(AST)  Date Value Ref Range Status  07/25/2014 29 15 - 37 Unit/L Final         Passed - Cr in normal range and within 360 days    Creat  Date Value Ref Range Status  08/07/2023 0.89 0.70 - 1.22 mg/dL Final         Passed - HCT in normal range and within 360 days    HCT  Date Value Ref Range Status  08/07/2023 44.7 38.5 - 50.0 % Final   Hematocrit  Date Value Ref Range Status  06/24/2019 40.4 37.5 - 51.0 % Final         Passed - HGB in normal range and within 360 days    Hemoglobin  Date Value Ref Range Status  08/07/2023 14.5 13.2 - 17.1 g/dL Final  40/98/1191 47.8 13.0 - 17.7 g/dL Final         Passed - PLT in normal range and within 360 days    Platelets  Date Value Ref Range Status  08/07/2023 279 140 - 400 Thousand/uL Final  06/24/2019 215 150 - 450 x10E3/uL Final         Passed - eGFR is 15 or above and within 360 days    GFR, Est African American  Date Value Ref Range Status  07/12/2020 97 > OR = 60 mL/min/1.13m2 Final   GFR, Est Non  African American  Date Value Ref Range Status  07/12/2020 84 > OR = 60 mL/min/1.80m2 Final   GFR, Estimated  Date Value Ref Range Status  06/21/2022 >60 >60 mL/min Final    Comment:    (NOTE) Calculated using the CKD-EPI Creatinine Equation (2021)    eGFR  Date Value Ref Range Status  08/07/2023 86 > OR = 60 mL/min/1.81m2 Final         Passed - Patient is not pregnant

## 2023-12-25 ENCOUNTER — Encounter: Payer: Self-pay | Admitting: Gastroenterology

## 2023-12-25 DIAGNOSIS — K7031 Alcoholic cirrhosis of liver with ascites: Secondary | ICD-10-CM

## 2023-12-25 NOTE — Addendum Note (Signed)
 Addended by: Lovie Rudder on: 12/25/2023 08:04 AM   Modules accepted: Orders

## 2024-01-01 ENCOUNTER — Ambulatory Visit
Admission: RE | Admit: 2024-01-01 | Discharge: 2024-01-01 | Disposition: A | Discharge status: Home / Self Care | Source: Ambulatory Visit | Attending: Gastroenterology | Admitting: Gastroenterology

## 2024-01-01 DIAGNOSIS — K7031 Alcoholic cirrhosis of liver with ascites: Secondary | ICD-10-CM | POA: Diagnosis not present

## 2024-01-02 ENCOUNTER — Ambulatory Visit: Payer: Self-pay | Admitting: Gastroenterology

## 2024-01-02 LAB — AFP TUMOR MARKER: AFP, Serum, Tumor Marker: <1.8 ng/mL (ref 0.0–6.4)

## 2024-02-12 ENCOUNTER — Other Ambulatory Visit: Payer: Self-pay | Admitting: Family Medicine

## 2024-02-12 ENCOUNTER — Encounter: Payer: Self-pay | Admitting: Family Medicine

## 2024-02-12 ENCOUNTER — Ambulatory Visit: Payer: Self-pay | Admitting: Family Medicine

## 2024-02-12 VITALS — BP 130/68 | HR 87 | Ht 75.0 in | Wt 277.1 lb

## 2024-02-12 DIAGNOSIS — I10 Essential (primary) hypertension: Secondary | ICD-10-CM

## 2024-02-12 DIAGNOSIS — I4819 Other persistent atrial fibrillation: Secondary | ICD-10-CM

## 2024-02-12 DIAGNOSIS — K7030 Alcoholic cirrhosis of liver without ascites: Secondary | ICD-10-CM | POA: Diagnosis not present

## 2024-02-12 DIAGNOSIS — F5101 Primary insomnia: Secondary | ICD-10-CM

## 2024-02-12 DIAGNOSIS — R7309 Other abnormal glucose: Secondary | ICD-10-CM

## 2024-02-12 DIAGNOSIS — E785 Hyperlipidemia, unspecified: Secondary | ICD-10-CM

## 2024-02-12 DIAGNOSIS — H8113 Benign paroxysmal vertigo, bilateral: Secondary | ICD-10-CM | POA: Diagnosis not present

## 2024-02-12 DIAGNOSIS — I700 Atherosclerosis of aorta: Secondary | ICD-10-CM

## 2024-02-12 DIAGNOSIS — Z0000 Encounter for general adult medical examination without abnormal findings: Secondary | ICD-10-CM

## 2024-02-12 DIAGNOSIS — F331 Major depressive disorder, recurrent, moderate: Secondary | ICD-10-CM

## 2024-02-12 DIAGNOSIS — I2699 Other pulmonary embolism without acute cor pulmonale: Secondary | ICD-10-CM

## 2024-02-12 DIAGNOSIS — N401 Enlarged prostate with lower urinary tract symptoms: Secondary | ICD-10-CM

## 2024-02-12 DIAGNOSIS — K219 Gastro-esophageal reflux disease without esophagitis: Secondary | ICD-10-CM

## 2024-02-12 LAB — POCT GLYCOSYLATED HEMOGLOBIN (HGB A1C): Hemoglobin A1C: 5.8 % — AB (ref 4.0–5.6)

## 2024-02-12 MED ORDER — DAYVIGO 5 MG PO TABS: 5.0000 mg | ORAL_TABLET | Freq: Every day | ORAL | 2 refills | Status: DC

## 2024-02-12 MED ORDER — OMEPRAZOLE 40 MG PO CPDR: 40.0000 mg | DELAYED_RELEASE_CAPSULE | Freq: Every day | ORAL | 3 refills | Status: AC

## 2024-02-12 MED ORDER — RIVAROXABAN 20 MG PO TABS
20.0000 mg | ORAL_TABLET | Freq: Every day | ORAL | 3 refills | Status: AC
Start: 2024-02-12 — End: ?

## 2024-02-12 MED ORDER — METOPROLOL SUCCINATE ER 25 MG PO TB24: 25.0000 mg | ORAL_TABLET | Freq: Every day | ORAL | 3 refills | Status: AC

## 2024-02-12 MED ORDER — ATORVASTATIN CALCIUM 40 MG PO TABS: 40.0000 mg | ORAL_TABLET | Freq: Every day | ORAL | 3 refills | Status: AC

## 2024-02-12 NOTE — Patient Instructions (Addendum)
 Thank you for coming to the office today.  Rx Dayvigo  5mg  nightly for insomnia.  1. You have symptoms of Vertigo (Benign Paroxysmal Positional Vertigo) - This is commonly caused by inner ear fluid imbalance, sometimes can be worsened by allergies and sinus symptoms, otherwise it can occur randomly sometimes and we may never discover the exact cause. - To treat this, try the Epley Manuever (see diagrams/instructions below) at home up to 3 times a day for 1-2 weeks or until symptoms resolve - You may take Meclizine as needed up to 3 times a day for dizziness, this will not cure symptoms but may help. Caution may make you drowsy.  If you develop significant worsening episode with vertigo that does not improve and you get severe headache, loss of vision, arm or leg weakness, slurred speech, or other concerning symptoms please seek immediate medical attention at Emergency Department.  Please schedule a follow-up appointment with Dr Edman within 4 weeks if Vertigo not improving, and will consider Referral to Vestibular Rehab  See the next page for images describing the Epley Manuever.     ----------------------------------------------------------------------------------------------------------------------      DUE for FASTING BLOOD WORK (no food or drink after midnight before the lab appointment, only water or coffee without cream/sugar on the morning of)  SCHEDULE Lab Only visit in the morning at the clinic for lab draw in 6 MONTHS   - Make sure Lab Only appointment is at about 1 week before your next appointment, so that results will be available  For Lab Results, once available within 2-3 days of blood draw, you can can log in to MyChart online to view your results and a brief explanation. Also, we can discuss results at next follow-up visit.    Please schedule a Follow-up Appointment to: Return in about 6 months (around 08/14/2024) for 6 month fasting lab > 1 week later Annual  Physical.  If you have any other questions or concerns, please feel free to call the office or send a message through MyChart. You may also schedule an earlier appointment if necessary.  Additionally, you may be receiving a survey about your experience at our office within a few days to 1 week by e-mail or mail. We value your feedback.  Marsa Edman, DO [facility], [place]

## 2024-02-12 NOTE — Progress Notes (Signed)
 Subjective:    Patient ID: Craig [name], male    DOB: 1940/11/04, 83 y.o.   MRN: [id number]  Craig [name] is a 83 y.o. male presenting on 02/12/2024 for Medical Management of Chronic Issues   HPI  Discussed the use of AI scribe software for clinical note transcription with the patient, who gave verbal consent to proceed.  History of Present Illness   Craig [name] is an 83 year old male with vertigo and sleep disturbances who presents with dizziness and insomnia.  Vertigo and gait instability - Vertigo episodes occur primarily at night while in bed when wakes up and moves and turns head. - Sensation described as the room spinning and the wall moving like a waterfall - Symptoms are positional; changing positions in bed can alleviate dizziness - Duration of symptoms is approximately one year - No associated ear symptoms - Unsteadiness on his feet with need to correct balance  Insomnia and sleep disturbances Chronic problem - Difficulty sleeping at night, often awake until early morning hours - History of sleep disturbances for over seventy years - Ambien  previously prescribed; initially effective but lost efficacy over time - History of sleepwalking associated with Ambien  use  Chronic cervicalgia / neck spasm - Chronic neck pain described as a 'kink' in the neck - Attributes pain to pillow issues - Currently using a foam pillow with a ridge, which provides some relief - Has tried multiple pillows  Pruritus - Persistent itching that comes and goes with touch  History of venous thromboembolism and anticoagulation - History of pulmonary embolism - Currently taking Xarelto  - Sensation of blood clots in the legs causing pain - Attempts to alleviate leg pain by massaging legs at night  Glycemic control - Recent blood sugar reading of 5.8, improved from previous reading of 6.4 - Consumes sugar regularly - Surprised by improvement in blood sugar     Emphysema / Chronic  Cough 5+ years  Tried Flonase  allergy meds, GERD therapy in past. Also has been dx with COPD but never maintained on regular inhaler in past since had not bothered him as much. Now feels like it affects his breathing interested in therapy.   Alcoholic Cirrhosis Followed by Dr Jinny [facility] previously, he is no longer there, they are waiting on new provider. Labs and US  Surveillance   Cardiology Permanent Atrial Fibrillation Followed by EP Dr [facility] He is managed on Metoprolol  XL for rate control and Xarelto  for anticoagulation   Hypertension Low BP lately He remains off Losartan  and Amlodipine  Continues on Metoprolol  for rate.   HYPERLIPIDEMIA: - Reports no concerns. 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 He sleeps during the day with naps. Long term problem.   Recurrent PE/DVT, on anticoagulation lifelong Reports no new problem. Continues to take Xarelto  daily for anticoagulation       02/12/2024   10:46 AM 08/10/2023    3:33 PM 08/14/2022   11:01 AM  Depression screen PHQ 2/9  Decreased Interest 3 3 2   Down, Depressed, Hopeless 1 1 3   PHQ - 2 Score 4 4 5   Altered sleeping 3 3 3  Tired, decreased energy 3 3 3   Change in appetite 0 2 3  Feeling bad or failure about yourself  0 0 3  Trouble concentrating 1 3 3   Moving slowly or fidgety/restless 0 1 1  Suicidal thoughts 0 0 1  PHQ-9 Score 11 16 22   Difficult doing work/chores Somewhat difficult  Somewhat difficult       02/12/2024   10:46 AM 08/10/2023    3:34 PM 08/14/2022   11:03 AM 12/23/2021    3:32 PM  GAD 7 : Generalized  Anxiety Score  Nervous, Anxious, on Edge 1 2 3 2   Control/stop worrying 0 2 1 1   Worry too much - different things 1 1 1 1   Trouble relaxing 1 1 3 1   Restless 1 1 1 1   Easily annoyed or irritable 1 2 1 1   Afraid - awful might happen 0 0 1 1  Total GAD 7 Score 5 9 11 8   Anxiety Difficulty Somewhat difficult Somewhat difficult Somewhat difficult Somewhat difficult    Social History   Tobacco Use   Smoking status: Former    Current packs/day: 0.00    Types: Cigarettes    Start date: 07/24/1958    Quit date: 07/24/1978    Years since quitting: 45.5   Smokeless tobacco: Former   Tobacco comments:    Quit tobacco in 1980  Vaping Use   Vaping status: Never Used  Substance Use Topics   Alcohol use: Not Currently    Alcohol/week: 3.0 standard drinks of alcohol    Types: 3 Shots of liquor per week    Comment: daily   Drug use: No    Review of Systems Per HPI unless specifically indicated above     Objective:    BP 130/68 (BP Location: Right Arm, Patient Position: Sitting, Cuff Size: Normal)   Pulse 87   Ht 6' 3 (1.905 m)   Wt 277 lb 2 oz (125.7 kg)   SpO2 99%   BMI 34.64 kg/m   Wt Readings from Last 3 Encounters:  02/12/24 277 lb 2 oz (125.7 kg)  08/10/23 270 lb (122.5 kg)  07/05/23 269 lb (122 kg)    Physical Exam Vitals and nursing note reviewed.  Constitutional:      General: He is not in acute distress.    Appearance: He is well-developed. He is not diaphoretic.     Comments: Well-appearing, comfortable, cooperative  HENT:     Head: Normocephalic and atraumatic.  Eyes:     General:        Right eye: No discharge.        Left eye: No discharge.     Conjunctiva/sclera: Conjunctivae normal.  Neck:     Thyroid: No thyromegaly.  Cardiovascular:     Rate and Rhythm: Normal rate and regular rhythm.     Pulses: Normal pulses.     Heart sounds: Normal heart sounds. No murmur heard. Pulmonary:     Effort: Pulmonary effort is normal. No respiratory distress.      Breath sounds: Normal breath sounds. No wheezing or rales.  Musculoskeletal:        General: Normal range of motion.     Cervical back: Normal range of motion and neck supple.     Right lower leg: Edema present.     Left lower leg: Edema present.  Lymphadenopathy:     Cervical: No cervical adenopathy.  Skin:    General: Skin is warm and dry.     Findings:  No erythema or rash.  Neurological:     Mental Status: He is alert and oriented to person, place, and time. Mental status is at baseline.  Psychiatric:        Behavior: Behavior normal.     Comments: Well groomed, good eye contact, normal speech and thoughts     Results for orders placed or performed in visit on 02/12/24  POCT HgB A1C   Collection Time: 02/12/24 10:53 AM  Result Value Ref Range   Hemoglobin A1C 5.8 (A) 4.0 - 5.6 %   HbA1c POC (<> result, manual entry)     HbA1c, POC (prediabetic range)     HbA1c, POC (controlled diabetic range)        Assessment & Plan:   Problem List Items Addressed This Visit     Essential hypertension (Chronic)   Relevant Medications   rivaroxaban  (XARELTO ) 20 MG TABS tablet   metoprolol  succinate (TOPROL -XL) 25 MG 24 hr tablet   atorvastatin  (LIPITOR) 40 MG tablet   Aortic atherosclerosis (HCC)   Relevant Medications   rivaroxaban  (XARELTO ) 20 MG TABS tablet   metoprolol  succinate (TOPROL -XL) 25 MG 24 hr tablet   atorvastatin  (LIPITOR) 40 MG tablet   Benign paroxysmal positional vertigo due to bilateral vestibular disorder - Primary   Insomnia   Relevant Medications   DAYVIGO  5 MG TABS   Moderate recurrent major depression (HCC)   Persistent atrial fibrillation (HCC)   Relevant Medications   rivaroxaban  (XARELTO ) 20 MG TABS tablet   metoprolol  succinate (TOPROL -XL) 25 MG 24 hr tablet   atorvastatin  (LIPITOR) 40 MG tablet   Recurrent pulmonary emboli (HCC)   Relevant Medications   rivaroxaban  (XARELTO ) 20 MG TABS tablet   metoprolol  succinate (TOPROL -XL) 25 MG 24 hr tablet    atorvastatin  (LIPITOR) 40 MG tablet   Other Visit Diagnoses       Abnormal glucose       Relevant Orders   POCT HgB A1C (Completed)     Alcoholic cirrhosis of liver without ascites (HCC)         Gastroesophageal reflux disease without esophagitis       Relevant Medications   omeprazole  (PRILOSEC) 40 MG capsule     Hyperlipidemia with target LDL less than 70       Relevant Medications   rivaroxaban  (XARELTO ) 20 MG TABS tablet   metoprolol  succinate (TOPROL -XL) 25 MG 24 hr tablet   atorvastatin  (LIPITOR) 40 MG tablet        BPPV Vertigo Intermittent positional vertigo likely due to inner ear issues. Positional in nature with symptom improvement upon positional changes. Epley maneuver recommended. - Provide Epley maneuver instructions for home exercises. - Advise performing the Epley maneuver 2-3 times daily. - Consider referral to a physical vestibular therapist if home exercises are ineffective.  Chronic Insomnia Long-standing insomnia with previous adverse effects from Ambien  with fall history. Chronic insomnia for reported 70+ years. Multiple attempts at managing this in past without success  Discussed Dayvigo  as an alternative, considering cost and insurance coverage. - Prescribe Dayvigo  5 mg, one tablet nightly before bed. Max dose 5mg  due to liver history - Send prescription to [facility] for processing.  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 No new symptoms reported. On anticoagulation long term - Refill Xarelto  prescription and send to [facility].  Prediabetes Current HbA1c is 5.8, indicating low-end prediabetes, improved from previous 6.4. Effective metabolic control despite high sugar intake.  Chronic Neck Pain Chronic neck pain possibly  due to poor sleeping posture. Supportive pillow provides some relief. - Recommend continued use of supportive pillow.  Chronic Lymphedema Fluid retention and swelling previously managed with  hospitalization. Current leg pain without swelling. Manual mobilization provides some relief. - Discuss potential benefits of compression socks despite inconvenience. - Consider alternative methods for fluid management if symptoms persist.  Pruritus Intermittent itching possibly related to hepatic issues or xerosis dry skin.  Follow-up Next follow-up scheduled for late January, coinciding with hepatology appointment. - Schedule follow-up appointment for late January. - Arrange for blood work to be done in the morning prior to the visit.        Orders Placed This Encounter  Procedures   POCT HgB A1C    Meds ordered this encounter  Medications   DAYVIGO  5 MG TABS    Sig: Take 1 tablet (5 mg total) by mouth at bedtime.    Dispense:  30 tablet    Refill:  2   rivaroxaban  (XARELTO ) 20 MG TABS tablet    Sig: Take 1 tablet (20 mg total) by mouth daily with supper.    Dispense:  90 tablet    Refill:  3    Add refills for future   omeprazole  (PRILOSEC) 40 MG capsule    Sig: Take 1 capsule (40 mg total) by mouth daily.    Dispense:  90 capsule    Refill:  3    Add refills for future   metoprolol  succinate (TOPROL -XL) 25 MG 24 hr tablet    Sig: Take 1 tablet (25 mg total) by mouth daily.    Dispense:  90 tablet    Refill:  3    Add refills for future   atorvastatin  (LIPITOR) 40 MG tablet    Sig: Take 1 tablet (40 mg total) by mouth daily.    Dispense:  90 tablet    Refill:  3    Add refills for future    Follow up plan: Return in about 6 months (around 08/14/2024) for 6 month fasting lab > 1 week later Annual Physical.  Future labs 08/14/24  Marsa Officer, DO [facility] 02/12/2024, 11:09 AM

## 2024-02-13 ENCOUNTER — Other Ambulatory Visit (HOSPITAL_COMMUNITY): Payer: Self-pay

## 2024-02-13 ENCOUNTER — Telehealth: Payer: Self-pay

## 2024-02-13 NOTE — Telephone Encounter (Signed)
 Pharmacy Patient Advocate Encounter   Received notification from Onbase that prior authorization for DayVigo  5MG  tablets  is required/requested.   Insurance verification completed.   The patient is insured through [place] .   Per test claim: PA required; PA submitted to above mentioned insurance via CoverMyMeds Key/confirmation #/EOC [id number] Status is pending

## 2024-02-14 NOTE — Telephone Encounter (Signed)
 Pharmacy Patient Advocate Encounter  Received notification from [employer] that Prior Authorization for DayVigo  5MG  tablets  has been DENIED.  See denial reason below. No denial letter attached in CMM. Will attach denial letter to Media tab once received.   PA #/Case ID/Reference #: [id number]

## 2024-02-14 NOTE — Telephone Encounter (Signed)
 Patient has tried and failed Trazodone  in past. He also has failed Ambien  due to recurrent falls and sedation with this medication. We discussed caution with avoiding Belsomra.  Also Trazodone  is listed as an allergy with itching swelling on chart.  If possible could this info be resubmitted for an appeal? Let me know if you need more specifics.  Otherwise, we may have to order Belsomra to trial it first if needed.  Marsa Officer, DO [facility] 02/14/2024, 9:54 AM

## 2024-02-18 NOTE — Telephone Encounter (Signed)
 I am routing to the appeals team.

## 2024-02-19 ENCOUNTER — Telehealth: Payer: Self-pay | Admitting: Pharmacist

## 2024-02-19 NOTE — Telephone Encounter (Signed)
 An E-Appeal has been submitted for DayVigo . Will advise when response is received or follow up in 1 week. Please be advised that most companies may take 30 days to make a decision. Appeal letter and supporting documentation have been uploaded and submitted via CMM website on 02/19/2024 @12 :13 pm.  Thank you, Devere Pandy, PharmD Clinical Pharmacist  [place]  Direct Dial: [phone]

## 2024-02-21 ENCOUNTER — Telehealth: Payer: Self-pay

## 2024-02-21 ENCOUNTER — Other Ambulatory Visit (HOSPITAL_COMMUNITY): Payer: Self-pay

## 2024-02-21 NOTE — Telephone Encounter (Signed)
 Left message for patient to return call OK to advise

## 2024-02-21 NOTE — Telephone Encounter (Signed)
 Insurance has approved the appeal for DayVigo :   Pharmacy notified and processed the claim, copay $64   Thank you, Devere Pandy, PharmD Clinical Pharmacist    Direct Dial: [phone]

## 2024-02-21 NOTE — Telephone Encounter (Signed)
 Copied from CRM [id number]. Topic: General - Call Back - No Documentation >> Feb 21, 2024  1:35 PM DeAngela L wrote: Reason for CRM: pt spouse called relayed the message from Dr MARLA to her and the copay amount, she understood the information

## 2024-02-21 NOTE — Telephone Encounter (Signed)
 Please contact patient to confirm that he is aware that the new sleep med was approved after an appeal. It is a brand name med and the copay cost is $64 per month.  He should be able to pick up from pharmacy now  Marsa Officer, DO [facility] 02/21/2024, 12:41 PM

## 2024-06-04 ENCOUNTER — Telehealth: Payer: Self-pay

## 2024-06-04 NOTE — Telephone Encounter (Signed)
 Pt  spouse is in office . She would like to know when pt will need to have live scan?

## 2024-06-04 NOTE — Telephone Encounter (Signed)
 Patient will need to establish with Robin or Dr Melany to establish care and to have US  and labs ordered as it will be due in the next 1-2 months

## 2024-06-25 NOTE — Progress Notes (Signed)
 06/26/2024 Craig [name] [id number] 06-Aug-1940  Gastroenterology Office Note     Primary Care Physician:  Edman Marsa PARAS, DO  Primary GI Provider: Jinny Carmine, MD    Chief Complaint   Chief Complaint  Patient presents with   New Patient (Initial Visit)    6 mo f/u cirrhosis- labs-     History of Present Illness   Craig [name] is a 83 y.o. male with PMHX of cirrhosis of liver, A-fib presenting today for annual follow up.  Patient accompanied by wife today.  He continues to have alpha-fetoprotein and right upper quadrant ultrasound every 6 months, last seen in the office about a year ago.  Patient reports he has a lot of health issues but seems to be doing well as far as his cirrhosis is concerned. He continues to abstain from alcohol use. Patient reports he does have some difficulty swallowing, reports sometimes I have to double swallow to get things down.  Patient also reports he has a cough all the time, this is not new and has been going on for over 5 years  He reports he takes Metamucil daily and has 1-2 bowel movements daily.  Denies melena or hematochezia.    Patient last seen by Dr. Edith on 07/05/2023 for follow-up of cirrhosis.  AFP and right upper quadrant ultrasound with no signs of masses or worrisome features.  Patient had completely stopped drinking and ascites resolved without any medication.  01/01/2024 AFP < 1.8 01/01/2024 US  RUQ abdomen - Liver: No focal lesion identified. Increased echotexture. Portal vein is patent on color Doppler imaging with normal direction of blood flow towards the liver. IMPRESSION: 1. Cholelithiasis without sonographic evidence of acute cholecystitis. 2. Increased echotexture of the liver. This is nonspecific but can be seen in fatty infiltration of liver.  01/31/2022 - Grade I esophageal varices.  - Normal stomach, duodenum.  - No specimens collected. 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[facility])     Past Surgical History:  Procedure Laterality Date   ESOPHAGOGASTRODUODENOSCOPY (EGD) WITH PROPOFOL  N/A 01/31/2022   Procedure: ESOPHAGOGASTRODUODENOSCOPY (EGD) WITH PROPOFOL ;  Surgeon: Jinny Carmine, MD;  Location: [facility] ENDOSCOPY;  Service: Endoscopy;  Laterality: N/A;   LEFT HEART CATHETERIZATION WITH CORONARY ANGIOGRAM Bilateral 07/27/2014   Procedure: LEFT HEART CATHETERIZATION WITH CORONARY ANGIOGRAM;  Surgeon: Dorn PARAS Lesches, MD;  Location: [facility] CATH LAB;  Service: Cardiovascular;  Laterality: Bilateral;   SPLENECTOMY, TOTAL     TTP    Current Outpatient Medications  Medication Sig Dispense Refill   atorvastatin  (LIPITOR) 40 MG tablet Take 1 tablet (40 mg total) by mouth daily. 90 tablet 3   cholecalciferol  (VITAMIN D) 1000 UNITS tablet Take 1,000 Units by mouth daily.     escitalopram  (LEXAPRO ) 20 MG tablet TAKE 1 TABLET EVERY DAY 90 tablet 3   metoprolol  succinate (TOPROL -XL) 25 MG 24 hr tablet Take 1 tablet (25 mg total) by mouth daily. 90 tablet 3   omeprazole  (PRILOSEC) 40 MG capsule Take 1 capsule (40 mg total) by mouth daily. 90 capsule 3   rivaroxaban  (XARELTO ) 20 MG TABS tablet Take 1 tablet (20 mg total) by mouth daily with supper. 90 tablet 3   No current facility-administered medications for this visit.    Allergies as of 06/26/2024 - Review Complete 06/26/2024  Allergen Reaction Noted   Trazodone  and nefazodone Itching and Swelling 05/08/2017    Family History  Problem Relation Age of Onset   CAD Father 56   Kidney failure Mother    CAD Brother     Social History   Socioeconomic History   Marital status: Single    Spouse name: Not on file   Number of children: Not on file   Years of education: Not on file   Highest education level: Associate degree: academic program  Occupational History   Occupation: Retired Research Officer, [employer])  Tobacco Use   Smoking status: Former    Current packs/day: 0.00    Types: Cigarettes    Start date: 07/24/1958    Quit date: 07/24/1978    Years since quitting: 45.9   Smokeless tobacco: Former   Tobacco comments:    Quit tobacco in 1980  Vaping Use   Vaping status: Never Used  Substance and Sexual Activity   Alcohol use: Not Currently    Alcohol/week: 3.0 standard drinks of alcohol    Types: 3 Shots of liquor per week    Comment: daily   Drug use: No   Sexual activity: Yes    Birth control/protection: None  Other Topics Concern   Not on file  Social History Narrative   Retired from [employer]. Retired x 20 years.  Two children.     Social Drivers of Corporate Investment Banker Strain: Low Risk  (02/11/2024)   Overall Financial Resource Strain (CARDIA)    Difficulty of Paying Living Expenses: Not hard at all  Food Insecurity: No Food Insecurity (02/11/2024)   Hunger Vital Sign    Worried About Running Out of Food in the Last Year: Never true    Ran Out of Food in the Last Year: Never true  Transportation Needs: No Transportation Needs (02/11/2024)   PRAPARE - Administrator, Civil Service (Medical): No    Lack of Transportation (Non-Medical): No  Physical Activity: Inactive (02/11/2024)   Exercise Vital Sign    Days of Exercise per Week: 0 days    Minutes of Exercise per Session: Not on file  Stress: Stress Concern Present (02/11/2024)   [employer] of Occupational Health - Occupational Stress Questionnaire    Feeling of Stress: To some extent  Social Connections: Socially Isolated (02/11/2024)   Social Connection and Isolation Panel    Frequency of Communication with Friends and Family: Once a week    Frequency of Social Gatherings with Friends and Family: Never    Attends Religious Services: Never    Database Administrator or Organizations: No    Attends Engineer, Structural: Not on file    Marital Status: Living with partner   Intimate Partner Violence: Not At Risk (03/22/2022)   Humiliation, Afraid, Rape, and Kick questionnaire    Fear of Current or Ex-Partner: No    Emotionally Abused: No    Physically Abused: No    Sexually Abused: No     RELEVANT GI HISTORY, IMAGING AND LABS: CBC    Component Value Date/Time   WBC 5.5 08/07/2023 1037   RBC 4.53 08/07/2023 1037   HGB 14.5 08/07/2023 1037   HGB 14.3 06/24/2019 1311   HCT 44.7 08/07/2023 1037   HCT 40.4 06/24/2019 1311   PLT 279 08/07/2023 1037   PLT 215 06/24/2019 1311   MCV 98.7 08/07/2023 1037   MCV 114 (H) 06/24/2019 1311   MCV 107 (  H) 07/25/2014 1325   MCH 32.0 08/07/2023 1037   MCHC 32.4 08/07/2023 1037   RDW 14.2 08/07/2023 1037   RDW 13.3 06/24/2019 1311   RDW 14.5 07/25/2014 1325   LYMPHSABS 1,810 08/10/2021 1132   LYMPHSABS 2.5 06/24/2019 1311   MONOABS 780 05/11/2016 1102   EOSABS 61 08/07/2023 1037   EOSABS 0.0 06/24/2019 1311   BASOSABS 28 08/07/2023 1037   BASOSABS 0.0 06/24/2019 1311   Recent Labs    08/07/23 1037  HGB 14.5    CMP     Component Value Date/Time   NA 141 08/07/2023 1037   NA 146 (H) 06/24/2019 1311   NA 141 07/25/2014 1325   K 4.2 08/07/2023 1037   K 3.4 (L) 07/25/2014 1325   CL 100 08/07/2023 1037   CL 109 (H) 07/25/2014 1325   CO2 34 (H) 08/07/2023 1037   CO2 24 07/25/2014 1325   GLUCOSE 116 (H) 08/07/2023 1037   GLUCOSE 102 (H) 07/25/2014 1325   BUN 15 08/07/2023 1037   BUN 8 06/24/2019 1311   BUN 16 07/25/2014 1325   CREATININE 0.89 08/07/2023 1037   CALCIUM  9.9 08/07/2023 1037   CALCIUM  9.4 07/25/2014 1325   PROT 6.9 08/07/2023 1037   PROT 6.1 06/24/2019 1311   PROT 7.4 07/25/2014 1325   ALBUMIN  3.6 06/21/2022 2044   ALBUMIN  4.0 06/24/2019 1311   ALBUMIN  3.3 (L) 07/25/2014 1325   AST 13 08/07/2023 1037   AST 29 07/25/2014 1325   ALT 5 (L) 08/07/2023 1037   ALT 16 07/25/2014 1325   ALKPHOS 64 06/21/2022 2044   ALKPHOS 89 07/25/2014 1325   BILITOT 1.0 08/07/2023 1037   BILITOT  0.9 06/24/2019 1311   BILITOT 0.8 07/25/2014 1325   GFRNONAA >60 06/21/2022 1620   GFRNONAA 84 07/12/2020 0809   GFRAA 97 07/12/2020 0809      Latest Ref Rng & Units 08/07/2023   10:37 AM 06/21/2022    8:44 PM 12/28/2021    9:33 AM  Hepatic Function  Total Protein 6.1 - 8.1 g/dL 6.9  7.5  6.6   Albumin  3.5 - 5.0 g/dL  3.6    AST 10 - 35 U/L 13  16  19    ALT 9 - 46 U/L 5  8  8    Alk Phosphatase 38 - 126 U/L  64    Total Bilirubin 0.2 - 1.2 mg/dL 1.0  1.2  1.0   Bilirubin, Direct 0.0 - 0.2 mg/dL  0.2        Review of Systems   All systems reviewed and negative except where noted in HPI.    Physical Exam  BP 129/79   Pulse 79   Temp 97.7 F (36.5 C)   Ht 6' 3 (1.905 m)   Wt 284 lb 9.6 oz (129.1 kg)   SpO2 95%   BMI 35.57 kg/m  No LMP for male patient. General:   Alert and oriented. Pleasant and cooperative. Well-nourished and well-developed. In no acute distress.  Head:  Normocephalic and atraumatic. Eyes:  Without icterus Ears:  Normal auditory acuity. Lungs:  Respirations even and unlabored.  Clear throughout to auscultation.   No wheezes, crackles, or rhonchi. No acute distress. Heart:  Regular rate and rhythm; no murmurs, clicks, rubs, or gallops. Abdomen:  Normal bowel sounds.  No bruits.  Soft, non-tender and non-distended without masses, hepatosplenomegaly or hernias noted.  No guarding or rebound tenderness.   Rectal:  Deferred. Msk:  Symmetrical without gross deformities. Normal  posture. Extremities:  Mild nonpitting edema noted to BLE.  Neurologic:  Alert and  oriented x4;  grossly normal neurologically. Skin:  Intact without significant lesions or rashes. Psych:  Alert and cooperative. Normal mood and affect.   Assessment & Plan   Craig [name] is a 83 y.o. male presenting today for follow up for cirrhosis.  Also reports some difficulty swallowing.  Alcoholic cirrhosis of the liver.  Will continue to monitor for [facility] every 6 months. - Check AFP  - Will  order RUQ abdominal ultrasound - Grade 1 esophageal varices on EGD in 01/2022. Patient is due for recommended repeat EGD.  I discussed risks of EGD with patient today, including risk of sedation, bleeding or perforation. Patient provides understanding and gave verbal consent to proceed. - Will obtain clearance to hold Xarelto .  Dysphagia and coughing. - proceed with EGD. Can consider swallow eval pending EGD results.  Follow-up in 6 months or sooner if needed.  Grayce Bohr, DNP, AGNP-C [facility]

## 2024-06-26 ENCOUNTER — Ambulatory Visit: Admitting: Family Medicine

## 2024-06-26 ENCOUNTER — Encounter: Payer: Self-pay | Admitting: Family Medicine

## 2024-06-26 VITALS — BP 129/79 | HR 79 | Temp 97.7°F | Ht 75.0 in | Wt 284.6 lb

## 2024-06-26 DIAGNOSIS — Z8719 Personal history of other diseases of the digestive system: Secondary | ICD-10-CM | POA: Diagnosis not present

## 2024-06-26 DIAGNOSIS — K7030 Alcoholic cirrhosis of liver without ascites: Secondary | ICD-10-CM

## 2024-06-26 DIAGNOSIS — R1310 Dysphagia, unspecified: Secondary | ICD-10-CM

## 2024-06-26 NOTE — Patient Instructions (Signed)
 Xarelto  clearance sent to Dr Velton- if you have not seen him recently please call his office.  Ultrasound scheduled 06/30/24 @ [facility] entrance. Nothing to eat/drink after midnight   Please go have your labs done

## 2024-06-30 ENCOUNTER — Telehealth: Payer: Self-pay

## 2024-06-30 ENCOUNTER — Ambulatory Visit: Admission: RE | Admit: 2024-06-30 | Discharge: 2024-06-30 | Attending: Family Medicine | Admitting: Family Medicine

## 2024-06-30 DIAGNOSIS — Z8719 Personal history of other diseases of the digestive system: Secondary | ICD-10-CM

## 2024-06-30 DIAGNOSIS — K7030 Alcoholic cirrhosis of liver without ascites: Secondary | ICD-10-CM | POA: Diagnosis not present

## 2024-06-30 NOTE — Telephone Encounter (Signed)
 Received clearance from Dr.Karmalego's -Hold Xarelto  2 days prior and then restart 1 day after. Spoke with patient;s wife Newton.

## 2024-07-01 ENCOUNTER — Ambulatory Visit: Payer: Self-pay | Admitting: Family Medicine

## 2024-07-01 LAB — AFP TUMOR MARKER: AFP, Serum, Tumor Marker: <1.8 ng/mL (ref 0.0–6.4)

## 2024-07-09 ENCOUNTER — Other Ambulatory Visit: Payer: Self-pay | Admitting: Family Medicine

## 2024-07-09 DIAGNOSIS — F331 Major depressive disorder, recurrent, moderate: Secondary | ICD-10-CM

## 2024-07-09 DIAGNOSIS — F5101 Primary insomnia: Secondary | ICD-10-CM

## 2024-07-11 NOTE — Telephone Encounter (Signed)
 Too soon for refill, LRF 09/24/23 for 90/3 RF.  Requested Prescriptions  Pending Prescriptions Disp Refills   escitalopram  (LEXAPRO ) 20 MG tablet [Pharmacy Med Name: ESCITALOPRAM  OXALATE 20 MG Oral Tablet] 90 tablet 3    Sig: TAKE 1 TABLET EVERY DAY     Psychiatry:  Antidepressants - SSRI Passed - 07/11/2024  2:05 PM      Passed - Completed PHQ-2 or PHQ-9 in the last 360 days      Passed - Valid encounter within last 6 months    Recent Outpatient Visits           5 months ago Benign paroxysmal positional vertigo due to bilateral vestibular disorder   [facility] [place], Marsa PARAS, DO

## 2024-08-14 ENCOUNTER — Other Ambulatory Visit

## 2024-08-14 DIAGNOSIS — Z0000 Encounter for general adult medical examination without abnormal findings: Secondary | ICD-10-CM

## 2024-08-14 DIAGNOSIS — K7030 Alcoholic cirrhosis of liver without ascites: Secondary | ICD-10-CM

## 2024-08-14 DIAGNOSIS — I10 Essential (primary) hypertension: Secondary | ICD-10-CM

## 2024-08-14 DIAGNOSIS — E785 Hyperlipidemia, unspecified: Secondary | ICD-10-CM

## 2024-08-14 DIAGNOSIS — I4819 Other persistent atrial fibrillation: Secondary | ICD-10-CM

## 2024-08-14 DIAGNOSIS — N401 Enlarged prostate with lower urinary tract symptoms: Secondary | ICD-10-CM

## 2024-08-14 DIAGNOSIS — I700 Atherosclerosis of aorta: Secondary | ICD-10-CM

## 2024-08-14 DIAGNOSIS — R7309 Other abnormal glucose: Secondary | ICD-10-CM

## 2024-08-15 LAB — COMPREHENSIVE METABOLIC PANEL WITH GFR
AG Ratio: 1.3 (calc) (ref 1.0–2.5)
ALT: 5 U/L — ABNORMAL LOW (ref 9–46)
AST: 13 U/L (ref 10–35)
Albumin: 4 g/dL (ref 3.6–5.1)
Alkaline phosphatase (APISO): 99 U/L (ref 35–144)
BUN: 14 mg/dL (ref 7–25)
CO2: 31 mmol/L (ref 20–32)
Calcium: 9.4 mg/dL (ref 8.6–10.3)
Chloride: 103 mmol/L (ref 98–110)
Creat: 0.93 mg/dL (ref 0.70–1.22)
Globulin: 3.1 g/dL (ref 1.9–3.7)
Glucose, Bld: 141 mg/dL — ABNORMAL HIGH (ref 65–99)
Potassium: 4.1 mmol/L (ref 3.5–5.3)
Sodium: 142 mmol/L (ref 135–146)
Total Bilirubin: 1 mg/dL (ref 0.2–1.2)
Total Protein: 7.1 g/dL (ref 6.1–8.1)
eGFR: 82 mL/min/1.73m2

## 2024-08-15 LAB — CBC WITH DIFFERENTIAL/PLATELET
Absolute Lymphocytes: 2117 {cells}/uL (ref 850–3900)
Absolute Monocytes: 659 {cells}/uL (ref 200–950)
Basophils Absolute: 43 {cells}/uL (ref 0–200)
Basophils Relative: 0.7 %
Eosinophils Absolute: 49 {cells}/uL (ref 15–500)
Eosinophils Relative: 0.8 %
HCT: 42.3 % (ref 39.4–51.1)
Hemoglobin: 12.6 g/dL — ABNORMAL LOW (ref 13.2–17.1)
MCH: 29.4 pg (ref 27.0–33.0)
MCHC: 29.8 g/dL — ABNORMAL LOW (ref 31.6–35.4)
MCV: 98.6 fL (ref 81.4–101.7)
MPV: 11 fL (ref 7.5–12.5)
Monocytes Relative: 10.8 %
Neutro Abs: 3233 {cells}/uL (ref 1500–7800)
Neutrophils Relative %: 53 %
Platelets: 335 Thousand/uL (ref 140–400)
RBC: 4.29 Million/uL (ref 4.20–5.80)
RDW: 15.2 % — ABNORMAL HIGH (ref 11.0–15.0)
Total Lymphocyte: 34.7 %
WBC: 6.1 Thousand/uL (ref 3.8–10.8)

## 2024-08-15 LAB — LIPID PANEL
Cholesterol: 122 mg/dL
HDL: 65 mg/dL
LDL Cholesterol (Calc): 40 mg/dL
Non-HDL Cholesterol (Calc): 57 mg/dL
Total CHOL/HDL Ratio: 1.9 (calc)
Triglycerides: 87 mg/dL

## 2024-08-15 LAB — HEMOGLOBIN A1C
Hgb A1c MFr Bld: 6.3 % — ABNORMAL HIGH
Mean Plasma Glucose: 134 mg/dL
eAG (mmol/L): 7.4 mmol/L

## 2024-08-15 LAB — PSA: PSA: 0.68 ng/mL

## 2024-08-21 ENCOUNTER — Ambulatory Visit: Admitting: Family Medicine

## 2024-08-21 VITALS — BP 124/58 | HR 79 | Ht 75.0 in | Wt 278.2 lb

## 2024-08-21 DIAGNOSIS — Z0000 Encounter for general adult medical examination without abnormal findings: Secondary | ICD-10-CM | POA: Diagnosis not present

## 2024-08-21 DIAGNOSIS — J432 Centrilobular emphysema: Secondary | ICD-10-CM

## 2024-08-21 DIAGNOSIS — I10 Essential (primary) hypertension: Secondary | ICD-10-CM | POA: Diagnosis not present

## 2024-08-21 DIAGNOSIS — F5101 Primary insomnia: Secondary | ICD-10-CM | POA: Diagnosis not present

## 2024-08-21 DIAGNOSIS — I8510 Secondary esophageal varices without bleeding: Secondary | ICD-10-CM

## 2024-08-21 DIAGNOSIS — G3184 Mild cognitive impairment, so stated: Secondary | ICD-10-CM | POA: Insufficient documentation

## 2024-08-21 DIAGNOSIS — I482 Chronic atrial fibrillation, unspecified: Secondary | ICD-10-CM | POA: Diagnosis not present

## 2024-08-21 DIAGNOSIS — K7030 Alcoholic cirrhosis of liver without ascites: Secondary | ICD-10-CM

## 2024-08-21 DIAGNOSIS — K7031 Alcoholic cirrhosis of liver with ascites: Secondary | ICD-10-CM

## 2024-08-21 DIAGNOSIS — R7309 Other abnormal glucose: Secondary | ICD-10-CM

## 2024-08-21 DIAGNOSIS — F331 Major depressive disorder, recurrent, moderate: Secondary | ICD-10-CM

## 2024-08-21 DIAGNOSIS — Z23 Encounter for immunization: Secondary | ICD-10-CM

## 2024-08-21 DIAGNOSIS — I4819 Other persistent atrial fibrillation: Secondary | ICD-10-CM

## 2024-08-21 DIAGNOSIS — Z86718 Personal history of other venous thrombosis and embolism: Secondary | ICD-10-CM

## 2024-08-21 DIAGNOSIS — I2699 Other pulmonary embolism without acute cor pulmonale: Secondary | ICD-10-CM

## 2024-08-21 DIAGNOSIS — I890 Lymphedema, not elsewhere classified: Secondary | ICD-10-CM

## 2024-08-21 MED ORDER — ESCITALOPRAM OXALATE 20 MG PO TABS: 20.0000 mg | ORAL_TABLET | Freq: Every day | ORAL | 1 refills | Status: AC

## 2024-08-21 NOTE — Progress Notes (Signed)
 "  Subjective:    Patient ID: Craig [name], male    DOB: [birth year], 84 y.o.   MRN: [id number]  Craig [name] is a 84 y.o. male presenting on 08/21/2024 for Annual Exam   HPI  Discussed the use of AI scribe software for clinical note transcription with the patient, who gave verbal consent to proceed.  History of Present Illness   Craig [name] is an 84 year old male with a history of alcoholic cirrhosis and prediabetes who presents for an annual physical exam.   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   Chronic Insomnia and sleep disturbances Chronic problem - Difficulty sleeping at night, often awake until early morning hours - History of sleep disturbances for over seventy years - Ambien  previously prescribed; initially effective but lost efficacy over time - History of sleepwalking associated with Ambien  use - Trial on Dayvigo  unsuccessful or high cost   History of venous thromboembolism and anticoagulation - History of pulmonary embolism - Currently taking Xarelto  - Sensation of blood clots in the legs causing pain - Attempts to alleviate leg pain by massaging legs at night   Alcoholic Cirrhosis w history ascites Followed by Dr Jinny AGI and has upcoming EGD for esophageal varices follow-up surveillance No longer on diuretic therapy Abstain from alcohol, doing much better now overal    Cardiology Permanent Atrial Fibrillation Followed by EP He is managed on Metoprolol  XL for rate control and Xarelto  for anticoagulation   Hypertension Low BP lately He remains off Losartan  and Amlodipine  Continues on Metoprolol  for rate.   HYPERLIPIDEMIA: - Reports no concerns. Last lipid panel 07/2024, controlled  LDL 40 - Currently taking Atorvastatin  40mg , tolerating well without side effects or myalgias   FOLLOW-UP Insomnia, Chronic / Recurrent Major Depression moderate See prior report He is doing well on current medications Taking Escitalopram  20mg  daily - See PHQ - Failed past meds Trazodone , Zoloft  SSRI      Health Maintenance: Vaccines updated   PSA 0.68  Flu Shot today  Up to date on Prevnar and Shingles vaccine TDap  Up to date reported on RSV 2023     08/21/2024   11:59 AM 02/12/2024   10:46 AM 08/10/2023    3:33 PM  Depression screen PHQ 2/9  Decreased Interest 3 3 3   Down, Depressed, Hopeless 1 1 1   PHQ - 2 Score 4 4 4   Altered sleeping 3 3 3   Tired, decreased energy 1 3 3   Change in appetite 0 0 2  Feeling bad or failure about yourself  0 0 0  Trouble concentrating 1 1 3   Moving slowly or fidgety/restless 0 0 1  Suicidal thoughts 0 0 0  PHQ-9 Score 9 11  16    Difficult doing work/chores Somewhat difficult Somewhat difficult      Data saved with a previous flowsheet row definition       02/12/2024   10:46 AM 08/10/2023    3:34  PM 08/14/2022   11:03 AM 12/23/2021    3:32 PM  GAD 7 : Generalized Anxiety Score  Nervous, Anxious, on Edge 1  2  3  2    Control/stop worrying 0  2  1  1    Worry too much - different things 1  1  1  1    Trouble relaxing 1  1  3  1    Restless 1  1  1  1    Easily annoyed or irritable 1  2  1  1    Afraid - awful might happen 0  0  1  1   Total GAD 7 Score 5 9 11 8   Anxiety Difficulty Somewhat difficult Somewhat difficult Somewhat difficult Somewhat difficult     Data saved with a previous flowsheet row definition      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[facility])    Past Surgical History:  Procedure Laterality Date   ESOPHAGOGASTRODUODENOSCOPY (EGD) WITH PROPOFOL  N/A 01/31/2022   Procedure: ESOPHAGOGASTRODUODENOSCOPY (EGD) WITH PROPOFOL ;  Surgeon: Jinny Carmine, MD;  Location: [facility] ENDOSCOPY;  Service: Endoscopy;  Laterality: N/A;   LEFT HEART CATHETERIZATION WITH CORONARY ANGIOGRAM Bilateral 07/27/2014   Procedure: LEFT HEART CATHETERIZATION WITH CORONARY ANGIOGRAM;  Surgeon: Dorn JINNY Lesches, MD;  Location: [facility] CATH LAB;  Service: Cardiovascular;  Laterality: Bilateral;   SPLENECTOMY, TOTAL     TTP   Social History   Socioeconomic History   Marital status: Single    Spouse name: Not on file   Number of children: Not on file   Years of education: Not on file   Highest education level: Associate degree: academic program  Occupational History   Occupation: Retired Water Quality Scientist)  Tobacco Use   Smoking status: Former    Current packs/day: 0.00    Types: Cigarettes    Start date: 07/24/1958    Quit date: 07/24/1978    Years since quitting: 46.1   Smokeless tobacco: Former   Tobacco comments:    Quit tobacco in 1980  Vaping Use   Vaping status: Never Used  Substance and Sexual Activity   Alcohol use: Not Currently    Alcohol/week: 3.0 standard drinks of alcohol    Types: 3 Shots of liquor per week    Comment: daily   Drug use: No   Sexual activity: Yes    Birth control/protection: None  Other Topics Concern   Not on file  Social History Narrative   Retired from [employer]. Retired x 20 years.  Two children.     Social Drivers of Health   Tobacco Use: Medium Risk (08/21/2024)   Patient History    Smoking Tobacco Use: Former    Smokeless Tobacco Use: Former    Passive Exposure: Not on Actuary Strain: Low Risk (08/21/2024)   Overall Financial  Resource Strain (CARDIA)    Difficulty of Paying Living Expenses: Not hard at all  Food Insecurity: No Food Insecurity (08/21/2024)   Epic    Worried About Programme Researcher, Broadcasting/film/video in the Last Year: Never true    Ran Out of Food in the Last Year: Never true  Transportation Needs: No Transportation Needs (08/21/2024)   Epic    Lack of Transportation (Medical): No    Lack of Transportation (Non-Medical): No  Physical Activity: Inactive (08/21/2024)   Exercise Vital Sign  Days of Exercise per Week: 0 days    Minutes of Exercise per Session: Not on file  Stress: Stress Concern Present (08/21/2024)   [employer] of Occupational Health - Occupational Stress Questionnaire    Feeling of Stress: To some extent  Social Connections: Unknown (08/21/2024)   Social Connection and Isolation Panel    Frequency of Communication with Friends and Family: Once a week    Frequency of Social Gatherings with Friends and Family: Patient declined    Attends Religious Services: Never    Database Administrator or Organizations: No    Attends Engineer, Structural: Not on file    Marital Status: Living with partner  Intimate Partner Violence: Not At Risk (03/22/2022)   Humiliation, Afraid, Rape, and Kick questionnaire    Fear of Current or Ex-Partner: No    Emotionally Abused: No    Physically Abused: No    Sexually Abused: No  Depression (PHQ2-9): Medium Risk (08/21/2024)   Depression (PHQ2-9)    PHQ-2 Score: 9  Alcohol Screen: Low Risk (03/22/2022)   Alcohol Screen    Last Alcohol Screening Score (AUDIT): 0  Housing: Unknown (08/21/2024)   Epic    Unable to Pay for Housing in the Last Year: No    Number of Times Moved in the Last Year: Not on file    Homeless in the Last Year: No  Utilities: Not At Risk (04/19/2022)   [facility] Utilities    Threatened with loss of utilities: No  Health Literacy: Not on file   Family History  Problem Relation Age of Onset   CAD Father 21   Kidney failure Mother     CAD Brother    Current Outpatient Medications on File Prior to Visit  Medication Sig   atorvastatin  (LIPITOR) 40 MG tablet Take 1 tablet (40 mg total) by mouth daily.   cholecalciferol  (VITAMIN D) 1000 UNITS tablet Take 1,000 Units by mouth daily.   metoprolol  succinate (TOPROL -XL) 25 MG 24 hr tablet Take 1 tablet (25 mg total) by mouth daily.   omeprazole  (PRILOSEC) 40 MG capsule Take 1 capsule (40 mg total) by mouth daily.   rivaroxaban  (XARELTO ) 20 MG TABS tablet Take 1 tablet (20 mg total) by mouth daily with supper.   No current facility-administered medications on file prior to visit.    Review of Systems  Constitutional:  Negative for activity change, appetite change, chills, diaphoresis, fatigue and fever.  HENT:  Negative for congestion and hearing loss.   Eyes:  Negative for visual disturbance.  Respiratory:  Negative for cough, chest tightness, shortness of breath and wheezing.   Cardiovascular:  Negative for chest pain, palpitations and leg swelling.  Gastrointestinal:  Negative for abdominal pain, constipation, diarrhea, nausea and vomiting.  Genitourinary:  Negative for dysuria, frequency and hematuria.  Musculoskeletal:  Negative for arthralgias and neck pain.  Skin:  Negative for rash.  Neurological:  Negative for dizziness, weakness, light-headedness, numbness and headaches.  Hematological:  Negative for adenopathy.  Psychiatric/Behavioral:  Negative for behavioral problems, dysphoric mood and sleep disturbance.    Per HPI unless specifically indicated above     Objective:    BP (!) 124/58 (BP Location: Right Arm, Patient Position: Sitting, Cuff Size: Large)   Pulse 79   Ht 6' 3 (1.905 m)   Wt 278 lb 4 oz (126.2 kg)   SpO2 94%   BMI 34.78 kg/m   Wt Readings from Last 3 Encounters:  08/21/24 278 lb 4 oz (126.2  kg)  06/26/24 284 lb 9.6 oz (129.1 kg)  02/12/24 277 lb 2 oz (125.7 kg)    Physical Exam Vitals and nursing note reviewed.  Constitutional:       General: He is not in acute distress.    Appearance: He is well-developed. He is obese. He is not diaphoretic.     Comments: Well-appearing, comfortable, cooperative  HENT:     Head: Normocephalic and atraumatic.  Eyes:     General:        Right eye: No discharge.        Left eye: No discharge.     Conjunctiva/sclera: Conjunctivae normal.     Pupils: Pupils are equal, round, and reactive to light.  Neck:     Thyroid: No thyromegaly.     Vascular: No carotid bruit.  Cardiovascular:     Rate and Rhythm: Normal rate and regular rhythm.     Pulses: Normal pulses.     Heart sounds: Normal heart sounds. No murmur heard. Pulmonary:     Effort: Pulmonary effort is normal. No respiratory distress.     Breath sounds: Normal breath sounds. No wheezing or rales.  Abdominal:     General: Bowel sounds are normal. There is no distension.     Palpations: Abdomen is soft. There is no mass.     Tenderness: There is no abdominal tenderness.  Musculoskeletal:        General: No tenderness. Normal range of motion.     Cervical back: Normal range of motion and neck supple.     Right lower leg: No edema.     Left lower leg: No edema.     Comments: Upper / Lower Extremities: - Normal muscle tone, strength bilateral upper extremities 5/5, lower extremities 5/5  Lymphadenopathy:     Cervical: No cervical adenopathy.  Skin:    General: Skin is warm and dry.     Findings: Lesion (seborrheic keratoses multiple) present. No erythema or rash.  Neurological:     Mental Status: He is alert and oriented to person, place, and time.     Comments: Distal sensation intact to light touch all extremities  Psychiatric:        Mood and Affect: Mood normal.        Behavior: Behavior normal.        Thought Content: Thought content normal.     Comments: Well groomed, good eye contact, normal speech and thoughts     Results for orders placed or performed in visit on 08/14/24  Comprehensive metabolic panel with  GFR   Collection Time: 08/14/24  9:20 AM  Result Value Ref Range   Glucose, Bld 141 (H) 65 - 99 mg/dL   BUN 14 7 - 25 mg/dL   Creat 9.06 9.29 - 8.77 mg/dL   eGFR 82 > OR = 60 fO/fpw/8.26f7   BUN/Creatinine Ratio SEE NOTE: 6 - 22 (calc)   Sodium 142 135 - 146 mmol/L   Potassium 4.1 3.5 - 5.3 mmol/L   Chloride 103 98 - 110 mmol/L   CO2 31 20 - 32 mmol/L   Calcium  9.4 8.6 - 10.3 mg/dL   Total Protein 7.1 6.1 - 8.1 g/dL   Albumin  4.0 3.6 - 5.1 g/dL   Globulin 3.1 1.9 - 3.7 g/dL (calc)   AG Ratio 1.3 1.0 - 2.5 (calc)   Total Bilirubin 1.0 0.2 - 1.2 mg/dL   Alkaline phosphatase (APISO) 99 35 - 144 U/L   AST 13 10 - 35  U/L   ALT 5 (L) 9 - 46 U/L  PSA   Collection Time: 08/14/24  9:20 AM  Result Value Ref Range   PSA 0.68 < OR = 4.00 ng/mL  CBC with Differential/Platelet   Collection Time: 08/14/24  9:20 AM  Result Value Ref Range   WBC 6.1 3.8 - 10.8 Thousand/uL   RBC 4.29 4.20 - 5.80 Million/uL   Hemoglobin 12.6 (L) 13.2 - 17.1 g/dL   HCT 57.6 60.5 - 48.8 %   MCV 98.6 81.4 - 101.7 fL   MCH 29.4 27.0 - 33.0 pg   MCHC 29.8 (L) 31.6 - 35.4 g/dL   RDW 84.7 (H) 88.9 - 84.9 %   Platelets 335 140 - 400 Thousand/uL   MPV 11.0 7.5 - 12.5 fL   Neutro Abs 3,233 1,500 - 7,800 cells/uL   Absolute Lymphocytes 2,117 850 - 3,900 cells/uL   Absolute Monocytes 659 200 - 950 cells/uL   Eosinophils Absolute 49 15 - 500 cells/uL   Basophils Absolute 43 0 - 200 cells/uL   Neutrophils Relative % 53 %   Total Lymphocyte 34.7 %   Monocytes Relative 10.8 %   Eosinophils Relative 0.8 %   Basophils Relative 0.7 %  Hemoglobin A1c   Collection Time: 08/14/24  9:20 AM  Result Value Ref Range   Hgb A1c MFr Bld 6.3 (H) <5.7 %   Mean Plasma Glucose 134 mg/dL   eAG (mmol/L) 7.4 mmol/L  Lipid panel   Collection Time: 08/14/24  9:20 AM  Result Value Ref Range   Cholesterol 122 <200 mg/dL   HDL 65 > OR = 40 mg/dL   Triglycerides 87 <849 mg/dL   LDL Cholesterol (Calc) 40 mg/dL (calc)   Total CHOL/HDL  Ratio 1.9 <5.0 (calc)   Non-HDL Cholesterol (Calc) 57 <869 mg/dL (calc)      Assessment & Plan:   Problem List Items Addressed This Visit     Essential hypertension (Chronic)   Alcoholic cirrhosis of liver with ascites (HCC)   Atrial fibrillation, chronic (HCC)   Esophageal varices without bleeding (HCC)   History of DVT of lower extremity   Insomnia   Relevant Medications   escitalopram  (LEXAPRO ) 20 MG tablet   Lymphedema of both lower extremities   Moderate recurrent major depression (HCC)   Relevant Medications   escitalopram  (LEXAPRO ) 20 MG tablet   Persistent atrial fibrillation (HCC)   Recurrent pulmonary emboli (HCC)   Other Visit Diagnoses       Annual physical exam    -  Primary     Alcoholic cirrhosis of liver without ascites (HCC)         Centrilobular emphysema (HCC)         Elevated hemoglobin A1c         Flu vaccine need       Relevant Orders   Flu vaccine HIGH DOSE PF(Fluzone Trivalent) (Completed)        Updated Health Maintenance information Reviewed recent lab results with patient Encouraged improvement to lifestyle with diet and exercise Goal of weight loss  Adult Wellness Visit Annual wellness visit completed. Vaccinations current. Blood panel shows prediabetes with A1c at 6.3. Cholesterol controlled with atorvastatin . Liver and kidney functions stable. PSA normal. - Continue current medications including atorvastatin . - Scheduled follow-up in six months for A1c check and routine monitoring.  Alcoholic cirrhosis of liver with ascites Cirrhosis stable with abstinence from alcohol. Liver function tests stable. No longer requiring treatment for ascites - has resolved, not  on diuretic therapy or paracentesis anymore - Continue abstinence from alcohol. - Continue follow-up with gastroenterologist Dr. Jinny for surveillance of esophageal varices on upcoming EGD - Discussed concern w/ anticoagulation risks w/ varices  Secondary esophageal varices  without bleeding Ongoing surveillance for esophageal varices. No current bleeding issues. - Continue surveillance with gastroenterologist Dr. Jinny - Continue current anticoagulation therapy with Xarelto  for Atrial Fibrillation  Chronic atrial fibrillation Managed with Xarelto . Blood counts stable. - Continue Xarelto  as prescribed. - Ensure refills are available through July.  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 No new symptoms reported. On anticoagulation long term  Prediabetes A1c at 6.3 indicates prediabetes. Recent increase in blood sugar due to candy consumption for dry mouth. - Reduce carbohydrate, starch, and sugar intake. - Use water, Betaine, or other sources for dry mouth instead of candy.  Chronic Insomnia Long-standing insomnia with previous adverse effects from Ambien  with fall history. Chronic insomnia for reported 70+ years. Multiple attempts at managing this in past without success Recent trial on Dayvigo  ineffective or not covered - Prescribe Dayvigo  5 mg, one tablet nightly before bed. 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 Symptoms include pain and discomfort in extremities. - Consider over-the-counter supplements such as B6 and alpha lipoic acid. - Will consider gabapentin or Lyrica for nerve pain management if symptoms worsen.  Chronic Lymphedema Fluid retention and swelling previously managed with hospitalization. Current leg pain without swelling. Manual mobilization provides some relief. - Discuss potential benefits of compression socks despite inconvenience. - Consider alternative methods for fluid management if symptoms persist. - May return to Vascular specialist to consider Lymphedema pump, he declines  today  Hyperlipidemia Well-controlled with atorvastatin . LDL at 40. - Continue atorvastatin  as prescribed.         Orders Placed This Encounter  Procedures   Flu vaccine HIGH DOSE PF(Fluzone Trivalent)    Meds ordered this encounter  Medications   escitalopram  (LEXAPRO ) 20 MG tablet    Sig: Take 1 tablet (20 mg total) by mouth daily.    Dispense:  90 tablet    Refill:  1    Add refills     Follow up plan: Return in about 6 months (around 02/18/2025) for 6 month PreDM A1c.  add uric acid possible  Marsa Officer, DO [facility] 08/21/2024, 11:49 AM  "

## 2024-08-21 NOTE — Patient Instructions (Addendum)
 Thank you for coming to the office today.  Keep up the good work overall Goal is to focus on reducing carb starch sugar intake.  Okay with the candy at night if you need them  Prefer water, biotene and other sources for treating dry mouth.  Flu Shot today  Keep apt with Dr Jinny March  Added refills for future.  Consider Uric Acid test next time for possible gout.  Consider the Vascular specialist referral  Likely neuropathy from alcohol history  Recommend Alpha Lipoic Acid supplement  Please schedule a Follow-up Appointment to: Return in about 6 months (around 02/18/2025) for 6 month PreDM A1c.  If you have any other questions or concerns, please feel free to call the office or send a message through MyChart. You may also schedule an earlier appointment if necessary.  Additionally, you may be receiving a survey about your experience at our office within a few days to 1 week by e-mail or mail. We value your feedback.  Marsa Officer, DO [facility], [place]

## 2024-09-22 ENCOUNTER — Ambulatory Visit: Admit: 2024-09-22 | Admitting: Gastroenterology

## 2024-09-22 SURGERY — EGD (ESOPHAGOGASTRODUODENOSCOPY)
Anesthesia: General

## 2024-12-29 ENCOUNTER — Ambulatory Visit: Admitting: Family Medicine

## 2025-02-18 ENCOUNTER — Ambulatory Visit: Admitting: Family Medicine
